# Patient Record
Sex: Male | Born: 1950 | Race: Black or African American | Hispanic: No | Marital: Married | Smoking: Former smoker
Health system: Southern US, Community
[De-identification: ages and names within clinical notes are randomized; demographics above are authoritative.]

## PROBLEM LIST (undated history)

## (undated) DIAGNOSIS — T7840XA Allergy, unspecified, initial encounter: Secondary | ICD-10-CM

## (undated) DIAGNOSIS — M1990 Unspecified osteoarthritis, unspecified site: Secondary | ICD-10-CM

## (undated) DIAGNOSIS — J449 Chronic obstructive pulmonary disease, unspecified: Secondary | ICD-10-CM

## (undated) DIAGNOSIS — IMO0001 Reserved for inherently not codable concepts without codable children: Secondary | ICD-10-CM

## (undated) DIAGNOSIS — J45909 Unspecified asthma, uncomplicated: Secondary | ICD-10-CM

## (undated) DIAGNOSIS — B370 Candidal stomatitis: Secondary | ICD-10-CM

## (undated) DIAGNOSIS — C61 Malignant neoplasm of prostate: Secondary | ICD-10-CM

## (undated) DIAGNOSIS — I10 Essential (primary) hypertension: Secondary | ICD-10-CM

## (undated) DIAGNOSIS — K219 Gastro-esophageal reflux disease without esophagitis: Secondary | ICD-10-CM

## (undated) HISTORY — DX: Candidal stomatitis: B37.0

## (undated) HISTORY — PX: HERNIA REPAIR: SHX51

## (undated) HISTORY — DX: Essential (primary) hypertension: I10

## (undated) HISTORY — DX: Unspecified asthma, uncomplicated: J45.909

## (undated) HISTORY — DX: Gastro-esophageal reflux disease without esophagitis: K21.9

## (undated) HISTORY — DX: Allergy, unspecified, initial encounter: T78.40XA

## (undated) HISTORY — DX: Unspecified osteoarthritis, unspecified site: M19.90

## (undated) HISTORY — PX: OTHER SURGICAL HISTORY: SHX169

## (undated) HISTORY — DX: Chronic obstructive pulmonary disease, unspecified: J44.9

## (undated) HISTORY — DX: Malignant neoplasm of prostate: C61

---

## 2002-07-31 ENCOUNTER — Encounter: Payer: Self-pay | Admitting: Emergency Medicine

## 2002-07-31 ENCOUNTER — Emergency Department (HOSPITAL_COMMUNITY): Admission: EM | Admit: 2002-07-31 | Discharge: 2002-07-31 | Payer: Self-pay | Admitting: Emergency Medicine

## 2003-06-22 ENCOUNTER — Encounter: Payer: Self-pay | Admitting: Internal Medicine

## 2003-06-22 ENCOUNTER — Encounter: Admission: RE | Admit: 2003-06-22 | Discharge: 2003-06-22 | Payer: Self-pay | Admitting: Internal Medicine

## 2003-06-29 ENCOUNTER — Encounter: Admission: RE | Admit: 2003-06-29 | Discharge: 2003-06-29 | Payer: Self-pay | Admitting: Internal Medicine

## 2003-06-29 ENCOUNTER — Encounter: Payer: Self-pay | Admitting: Internal Medicine

## 2003-08-05 ENCOUNTER — Encounter: Admission: RE | Admit: 2003-08-05 | Discharge: 2003-11-03 | Payer: Self-pay | Admitting: Neurosurgery

## 2005-10-30 ENCOUNTER — Encounter: Admission: RE | Admit: 2005-10-30 | Discharge: 2005-10-30 | Payer: Self-pay | Admitting: Internal Medicine

## 2006-12-06 ENCOUNTER — Ambulatory Visit (HOSPITAL_BASED_OUTPATIENT_CLINIC_OR_DEPARTMENT_OTHER): Admission: RE | Admit: 2006-12-06 | Discharge: 2006-12-06 | Payer: Self-pay | Admitting: Surgery

## 2010-08-12 ENCOUNTER — Emergency Department (HOSPITAL_COMMUNITY)
Admission: EM | Admit: 2010-08-12 | Discharge: 2010-08-13 | Payer: Self-pay | Source: Home / Self Care | Admitting: Emergency Medicine

## 2010-11-19 ENCOUNTER — Emergency Department (HOSPITAL_COMMUNITY)
Admission: EM | Admit: 2010-11-19 | Discharge: 2010-11-19 | Disposition: A | Discharge status: Home / Self Care | Payer: Worker's Compensation | Attending: Emergency Medicine | Admitting: Emergency Medicine

## 2010-11-19 DIAGNOSIS — I10 Essential (primary) hypertension: Secondary | ICD-10-CM | POA: Insufficient documentation

## 2010-11-19 DIAGNOSIS — S61209A Unspecified open wound of unspecified finger without damage to nail, initial encounter: Secondary | ICD-10-CM | POA: Insufficient documentation

## 2010-11-19 DIAGNOSIS — W268XXA Contact with other sharp object(s), not elsewhere classified, initial encounter: Secondary | ICD-10-CM | POA: Insufficient documentation

## 2010-11-19 DIAGNOSIS — Y9269 Other specified industrial and construction area as the place of occurrence of the external cause: Secondary | ICD-10-CM | POA: Insufficient documentation

## 2010-11-19 DIAGNOSIS — Y990 Civilian activity done for income or pay: Secondary | ICD-10-CM | POA: Insufficient documentation

## 2011-02-03 NOTE — Op Note (Signed)
NAME:  [name], [name]              ACCOUNT NO.:  [id number]   MEDICAL RECORD NO.:  [id number]          PATIENT TYPE:  AMB   LOCATION:  DSC                          FACILITY:  [facility]   PHYSICIAN:  Wilmon Arms. Corliss Skains, M.D. DATE OF BIRTH:  09/05/1951   DATE OF PROCEDURE:  12/06/2006  DATE OF DISCHARGE:                               OPERATIVE REPORT   PREOPERATIVE DIAGNOSES:  1. Left inguinal hernia.  2. Umbilical hernia.   POSTOPERATIVE DIAGNOSES:  1. Left inguinal hernia.  2. Umbilical hernia.   PROCEDURE PERFORMED:  1. Left inguinal hernia repair with mesh.  2. Umbilical hernia repair with mesh.   SURGEON:  Wilmon Arms. Tsuei, M.D.   ANESTHESIA:  General endotracheal.   INDICATIONS:  The patient is a 60 year old male who recently presented  with left groin pain.  He has developed a small bulge in this area.  His  job requires lot of heavy lifting, and this probably has contributed to  his hernia.  On careful examination, he was also noted to have a small  umbilical hernia.  He has elected to have both of these repaired at the  same time.   DESCRIPTION OF PROCEDURE:  The patient is brought to the operating room,  placed in supine position on the operating table.  After an adequate  level of general endotracheal anesthesia was obtained, the patient's  abdomen and left groin were shaved, prepped with Betadine and draped in  sterile fashion.  A time-out was taken to assure the proper patient and  proper procedure.  The area above his left inguinal ligament was  infiltrated with 0.25% Marcaine.  An oblique incision was made here.  Dissection was carried down to the external oblique fascia which was  opened along the direction of its fibers down to the external ring.  Blunt dissection was used to dissect around the spermatic cord.  The  ilioinguinal nerve was identified and preserved.  A fairly large  indirect hernia sac was dissected free from the spermatic cord and  reduced up  through the internal ring.  The internal ring was tightened  with a 2-0 Vicryl suture.  There was no direct hernia defect.  A keyhole  mesh was cut from a sheet of polypropylene mesh.  It was secured with 2-  0 Vicryl beginning at the pubic tubercle.  The tails were tucked around  the spermatic cord and underneath the external oblique fascia laterally.  The external oblique fascia was reapproximated with 2-0 Vicryl, and the  subcutaneous tissues were closed with 3-0 Vicryl.  Four-0 Monocryl was  used to close the skin.   We then turned our attention to the umbilical hernia.  An incision was  made below the umbilicus after infiltrating 0.25% Marcaine.  Dissection  was carried down to the subcutaneous tissues with cautery.  A small  hernia sac was peeled off of the posterior surface of the umbilicus.  This was reduced into the preperitoneal space.  A small Ventralex mesh  was then inserted in the preperitoneal space and deployed.  Zero Novofil  sutures were used to attach the  mesh to the posterior surface of the  fascia.  The umbilical defect was then closed with another 0 Novofil.  Three-  0 Vicryl was used to close the subcutaneous tissues, and 4-0 Monocryl  was used to close the skin.  Steri-Strips and clean dressings were  applied.  The patient was then extubated and brought to the recovery in  stable condition.  All sponge, instrument and needle counts were  correct.      Wilmon Arms. Tsuei, M.D.  Electronically Signed     MKT/MEDQ  D:  12/06/2006  T:  12/06/2006  Job:  [id number]   cc:   Olene Craven, M.D.

## 2011-09-25 ENCOUNTER — Encounter: Payer: Self-pay | Admitting: Internal Medicine

## 2011-10-27 ENCOUNTER — Other Ambulatory Visit: Payer: Self-pay | Admitting: Internal Medicine

## 2011-11-05 ENCOUNTER — Ambulatory Visit: Payer: Managed Care, Other (non HMO)

## 2011-11-05 ENCOUNTER — Ambulatory Visit (INDEPENDENT_AMBULATORY_CARE_PROVIDER_SITE_OTHER): Payer: Managed Care, Other (non HMO) | Admitting: Internal Medicine

## 2011-11-05 ENCOUNTER — Other Ambulatory Visit: Payer: Self-pay | Admitting: Internal Medicine

## 2011-11-05 DIAGNOSIS — I10 Essential (primary) hypertension: Secondary | ICD-10-CM

## 2011-11-05 DIAGNOSIS — Z0000 Encounter for general adult medical examination without abnormal findings: Secondary | ICD-10-CM | POA: Insufficient documentation

## 2011-11-05 DIAGNOSIS — Z79899 Other long term (current) drug therapy: Secondary | ICD-10-CM

## 2011-11-05 DIAGNOSIS — K219 Gastro-esophageal reflux disease without esophagitis: Secondary | ICD-10-CM

## 2011-11-05 DIAGNOSIS — G8929 Other chronic pain: Secondary | ICD-10-CM | POA: Insufficient documentation

## 2011-11-05 LAB — LIPID PANEL
Cholesterol: 165 mg/dL (ref 0–200)
Triglycerides: 70 mg/dL (ref <150)
VLDL: 14 mg/dL (ref 0–40)

## 2011-11-05 LAB — COMPREHENSIVE METABOLIC PANEL
Albumin: 4.4 g/dL (ref 3.5–5.2)
BUN: 19 mg/dL (ref 6–23)
CO2: 25 mEq/L (ref 19–32)
Glucose, Bld: 84 mg/dL (ref 70–99)
Sodium: 138 mEq/L (ref 135–145)
Total Bilirubin: 0.7 mg/dL (ref 0.3–1.2)
Total Protein: 6.7 g/dL (ref 6.0–8.3)

## 2011-11-05 LAB — POCT CBC
HCT, POC: 47.1 % (ref 43.5–53.7)
Hemoglobin: 15.1 g/dL (ref 14.1–18.1)
MCH, POC: 29 pg (ref 27–31.2)
MPV: 11.1 fL (ref 0–99.8)
POC MID %: 12.3 %M — AB (ref 0–12)
RBC: 5.2 M/uL (ref 4.69–6.13)
WBC: 5.7 10*3/uL (ref 4.6–10.2)

## 2011-11-05 LAB — POCT URINALYSIS DIPSTICK
Bilirubin, UA: NEGATIVE
Glucose, UA: NEGATIVE
Spec Grav, UA: 1.02

## 2011-11-05 LAB — POCT UA - MICROSCOPIC ONLY
Bacteria, U Microscopic: NEGATIVE
Casts, Ur, LPF, POC: NEGATIVE
Mucus, UA: NEGATIVE

## 2011-11-05 MED ORDER — CELECOXIB 200 MG PO CAPS: 200.0000 mg | ORAL_CAPSULE | Freq: Two times a day (BID) | ORAL | Status: DC

## 2011-11-05 MED ORDER — LISINOPRIL 5 MG PO TABS: 5.0000 mg | ORAL_TABLET | Freq: Every day | ORAL | Status: DC

## 2011-11-05 MED ORDER — DEXLANSOPRAZOLE 60 MG PO CPDR: 60.0000 mg | DELAYED_RELEASE_CAPSULE | Freq: Every day | ORAL | Status: DC

## 2011-11-05 NOTE — Progress Notes (Signed)
  Subjective:    Patient ID: [name], male    DOB: 1951/05/24, 61 y.o.   MRN: [id number]  HPI CPE GERD HTN ED Joint pain Allergy/asthma Edrick Oh great  See scanned form  Review of Systems  See scanned form   Objective:   Physical Exam  Constitutional: He is oriented to person, place, and time. He appears well-developed and well-nourished.  HENT:  Nose: Nose normal.  Mouth/Throat: Oropharynx is clear and moist.  Eyes: EOM are normal. Pupils are equal, round, and reactive to light.  Neck: Normal range of motion.  Cardiovascular: Normal rate, regular rhythm and normal heart sounds.   Pulmonary/Chest: Effort normal and breath sounds normal.  Abdominal: Soft. Bowel sounds are normal. He exhibits no mass. There is no tenderness.  Genitourinary: Rectum normal, prostate normal and penis normal.  Musculoskeletal: Normal range of motion.  Neurological: He is alert and oriented to person, place, and time. He has normal reflexes. No cranial nerve deficit. Coordination normal.  Skin: Skin is warm and dry.  Psychiatric: He has a normal mood and affect.     [facility] reading (PRIMARY) by  Dr. Perrin Maltese  CXR NAD       Assessment & Plan:  Refill meds one year  Cialis 20mg  rx hand written  F/up 6 mos.

## 2011-11-06 ENCOUNTER — Encounter: Payer: Self-pay | Admitting: Family Medicine

## 2011-11-23 ENCOUNTER — Other Ambulatory Visit: Payer: Self-pay | Admitting: Internal Medicine

## 2011-11-27 ENCOUNTER — Telehealth: Payer: Self-pay

## 2011-11-27 MED ORDER — LISINOPRIL 5 MG PO TABS: 5.0000 mg | ORAL_TABLET | Freq: Every day | ORAL | Status: DC

## 2011-11-27 NOTE — Telephone Encounter (Signed)
.  [facility]    PT STATES HE WAS ONLY GIVEN 15 OF HIS LISINOPRIL,HE WAS JUST HERE 3 WKS AGO, AND DR GUEST CHECKED HIS BP AND DISCUSSED HIS RX'S SO SHE STATES HE SHOULD NOT HAVE TO COME BACK IN FOR SAME.    BEST PHONE [phone]

## 2011-11-27 NOTE — Telephone Encounter (Signed)
Pt was seen in Feb.  rx okayed for 1 year.  rx sent in.  Pt notified

## 2012-02-08 ENCOUNTER — Other Ambulatory Visit: Payer: Self-pay | Admitting: Physician Assistant

## 2012-03-05 ENCOUNTER — Other Ambulatory Visit: Payer: Self-pay | Admitting: Internal Medicine

## 2012-06-06 ENCOUNTER — Other Ambulatory Visit: Payer: Self-pay | Admitting: Physician Assistant

## 2012-07-23 ENCOUNTER — Other Ambulatory Visit: Payer: Self-pay | Admitting: Physician Assistant

## 2012-07-28 ENCOUNTER — Telehealth: Payer: Self-pay

## 2012-07-28 ENCOUNTER — Ambulatory Visit (INDEPENDENT_AMBULATORY_CARE_PROVIDER_SITE_OTHER): Payer: Managed Care, Other (non HMO) | Admitting: Internal Medicine

## 2012-07-28 VITALS — BP 124/78 | HR 82 | Temp 97.8°F | Resp 16 | Ht 69.58 in | Wt 202.2 lb

## 2012-07-28 DIAGNOSIS — N529 Male erectile dysfunction, unspecified: Secondary | ICD-10-CM

## 2012-07-28 DIAGNOSIS — Z79899 Other long term (current) drug therapy: Secondary | ICD-10-CM

## 2012-07-28 DIAGNOSIS — R21 Rash and other nonspecific skin eruption: Secondary | ICD-10-CM

## 2012-07-28 DIAGNOSIS — I10 Essential (primary) hypertension: Secondary | ICD-10-CM

## 2012-07-28 DIAGNOSIS — M2550 Pain in unspecified joint: Secondary | ICD-10-CM

## 2012-07-28 DIAGNOSIS — G8929 Other chronic pain: Secondary | ICD-10-CM

## 2012-07-28 LAB — BASIC METABOLIC PANEL
BUN: 15 mg/dL (ref 6–23)
Calcium: 9.4 mg/dL (ref 8.4–10.5)
Creat: 0.71 mg/dL (ref 0.50–1.35)
Glucose, Bld: 148 mg/dL — ABNORMAL HIGH (ref 70–99)

## 2012-07-28 MED ORDER — CLOBETASOL PROPIONATE 0.05 % EX OINT: TOPICAL_OINTMENT | Freq: Two times a day (BID) | CUTANEOUS | Status: DC

## 2012-07-28 MED ORDER — TADALAFIL 20 MG PO TABS: 20.0000 mg | ORAL_TABLET | Freq: Every day | ORAL | Status: DC | PRN

## 2012-07-28 MED ORDER — CELECOXIB 200 MG PO CAPS: 200.0000 mg | ORAL_CAPSULE | Freq: Two times a day (BID) | ORAL | Status: DC

## 2012-07-28 NOTE — Progress Notes (Signed)
  Subjective:    Patient ID: [name], male    DOB: [birth year], 61 y.o.   MRN: [id number]  HPI No problems , is doing well Htn, gerd , ed all controlled.   Review of Systems     Objective:   Physical Exam Lungs clear Heart nl Skin itchy lower leg rsah       Assessment & Plan:  Dry skin/ clobetasol qhs RF meds 1 yr

## 2012-07-28 NOTE — Patient Instructions (Signed)

## 2012-08-08 ENCOUNTER — Telehealth: Payer: Self-pay

## 2012-08-08 NOTE — Telephone Encounter (Signed)
I called him back, he was sent to appt sch for this, he wants to see Dr Perrin Maltese.

## 2012-08-08 NOTE — Telephone Encounter (Signed)
PT STATES HE HAD RECEIVED A LETTER ABOUT HIM FOLLOWING UP FOR LAB WORK AND WANTED HIM TO DO IT ON THE 12TH, BUT HE JUST RECEIVED THIS IN THE MAIL YESTERDAY AND DOESN'T KNOW WHY IT TOOK SO LONG AND WHEN SHOULD HE COME BACK IN. PLEASE CALL [phone] OR [phone]

## 2012-08-12 ENCOUNTER — Telehealth: Payer: Self-pay | Admitting: Radiology

## 2012-08-12 NOTE — Telephone Encounter (Signed)
Patient called again about the labs, I discussed with him, as I have previously. He is advised again to return to clinic for the repeat glucose, fasting. He wants appt, so he is transferred to appt scheduling. Amy

## 2012-08-22 ENCOUNTER — Telehealth: Payer: Self-pay

## 2012-08-22 NOTE — Telephone Encounter (Signed)
The patient called to return call to this number in his telephone.  This Clinical research associate asked patient if there was a voicemail left for him and if so, did he remember the name of the person who called him.  The patient stated there was a message, but he didn't listen to it, he just called the number back.  No documentation of call to patient today- last telephone message dated 08/12/12.  Please call the patient at [phone].

## 2012-08-23 NOTE — Telephone Encounter (Signed)
LMOM for pt to let him know that he received a call from [place] yesterday to remind him of his appt on Mon 12/9 at 9:45.

## 2012-08-26 ENCOUNTER — Ambulatory Visit (INDEPENDENT_AMBULATORY_CARE_PROVIDER_SITE_OTHER): Payer: Managed Care, Other (non HMO) | Admitting: Internal Medicine

## 2012-08-26 ENCOUNTER — Encounter: Payer: Self-pay | Admitting: Internal Medicine

## 2012-08-26 VITALS — BP 125/81 | HR 84 | Temp 97.5°F | Resp 17 | Ht 69.0 in | Wt 207.0 lb

## 2012-08-26 DIAGNOSIS — R739 Hyperglycemia, unspecified: Secondary | ICD-10-CM

## 2012-08-26 DIAGNOSIS — N529 Male erectile dysfunction, unspecified: Secondary | ICD-10-CM

## 2012-08-26 DIAGNOSIS — R7309 Other abnormal glucose: Secondary | ICD-10-CM

## 2012-08-26 LAB — POCT GLYCOSYLATED HEMOGLOBIN (HGB A1C): Hemoglobin A1C: 5.5

## 2012-08-26 LAB — GLUCOSE, POCT (MANUAL RESULT ENTRY): POC Glucose: 124 mg/dl — AB (ref 70–99)

## 2012-08-26 NOTE — Progress Notes (Signed)
  Subjective:    Patient ID: [name], male    DOB: 18-Nov-1950, 61 y.o.   MRN: [id number]  HPI Had elevated glucose last visit and needs diabetes screening ED not much better on cialis, will screen testosterone   Review of Systems unchanged    Objective:   Physical Exam  Vitals reviewed. Constitutional: He is oriented to person, place, and time. He appears well-developed and well-nourished.  Eyes: EOM are normal.  Pulmonary/Chest: Effort normal.  Neurological: He is alert and oriented to person, place, and time. Coordination normal.  Psychiatric: He has a normal mood and affect. His behavior is normal. Judgment and thought content normal.   Results for orders placed in visit on 08/26/12  GLUCOSE, POCT (MANUAL RESULT ENTRY)      Component Value Range   POC Glucose 124 (*) 70 - 99 mg/dl  POCT GLYCOSYLATED HEMOGLOBIN (HGB A1C)      Component Value Range   Hemoglobin A1C 5.5       Total T a1c and FBS     Assessment & Plan:  No Diabetes ED

## 2012-08-27 ENCOUNTER — Encounter: Payer: Self-pay | Admitting: *Deleted

## 2012-09-16 ENCOUNTER — Telehealth: Payer: Self-pay

## 2012-09-16 DIAGNOSIS — N529 Male erectile dysfunction, unspecified: Secondary | ICD-10-CM

## 2012-09-16 NOTE — Telephone Encounter (Signed)
Dr. Perrin Maltese did not indicate what the next step would be.  It sounds like Cialis is not working well for patient, has he tried Viagra or Levitra?  Those could be possibilities.  Additionally there are other modalities such as injections, pumps, etc.  We can also make a referral to urology if he would like

## 2012-09-16 NOTE — Telephone Encounter (Signed)
Any suggestions on what to tell pt?

## 2012-09-16 NOTE — Telephone Encounter (Signed)
Patient would like to know what to do next. He got his testosterone labs and they were normal. Wasn't sure how to follow up. #[phone]

## 2012-09-17 MED ORDER — SILDENAFIL CITRATE 50 MG PO TABS: 50.0000 mg | ORAL_TABLET | Freq: Every day | ORAL | Status: DC | PRN

## 2012-09-17 NOTE — Telephone Encounter (Signed)
Viagra sent to [facility]

## 2012-09-17 NOTE — Telephone Encounter (Signed)
Spoke to patient, to see if he wants referral to Urology he does want this referral.( this is put in for him) Also would like to try the Viagra, he wants it sent to [facility].

## 2012-09-17 NOTE — Telephone Encounter (Signed)
Left message for him to call me back.  

## 2012-09-20 NOTE — Telephone Encounter (Signed)
LMOM for pt that Rx was sent to pharmacy.

## 2012-09-23 ENCOUNTER — Telehealth: Payer: Self-pay

## 2012-09-23 MED ORDER — TADALAFIL 20 MG PO TABS: 20.0000 mg | ORAL_TABLET | Freq: Every day | ORAL | Status: DC | PRN

## 2012-09-23 NOTE — Telephone Encounter (Signed)
Pt notified that rx was sent in 

## 2012-09-23 NOTE — Telephone Encounter (Signed)
PT WOULD LIKE TO GO BACK ON THE 20 MG. CIALIS.  SAYS THE VIAGRA IS NOT WORKING.  [phone]

## 2012-09-23 NOTE — Telephone Encounter (Signed)
PT WOULD LIKE TO GO BACK ON THE 20 MG CIALIS.  HE SAYS THE VIAGRA IS NOT WORKING. [phone]

## 2012-09-23 NOTE — Telephone Encounter (Signed)
I have resent Cialis.  Discontinue Viagra.  DO NOT take these medications together.

## 2012-10-09 ENCOUNTER — Other Ambulatory Visit: Payer: Self-pay | Admitting: Physician Assistant

## 2012-11-30 ENCOUNTER — Other Ambulatory Visit: Payer: Self-pay | Admitting: Physician Assistant

## 2012-12-30 ENCOUNTER — Other Ambulatory Visit: Payer: Self-pay | Admitting: Physician Assistant

## 2013-01-07 ENCOUNTER — Telehealth: Payer: Self-pay

## 2013-01-07 ENCOUNTER — Ambulatory Visit (INDEPENDENT_AMBULATORY_CARE_PROVIDER_SITE_OTHER): Payer: Managed Care, Other (non HMO) | Admitting: Family Medicine

## 2013-01-07 ENCOUNTER — Ambulatory Visit: Payer: Managed Care, Other (non HMO)

## 2013-01-07 VITALS — BP 114/78 | HR 75 | Temp 98.0°F | Resp 16 | Ht 69.0 in | Wt 206.6 lb

## 2013-01-07 DIAGNOSIS — M25562 Pain in left knee: Secondary | ICD-10-CM

## 2013-01-07 DIAGNOSIS — M25569 Pain in unspecified knee: Secondary | ICD-10-CM

## 2013-01-07 LAB — POCT CBC
HCT, POC: 49.5 % (ref 43.5–53.7)
Lymph, poc: 2.2 (ref 0.6–3.4)
MCHC: 32.1 g/dL (ref 31.8–35.4)
MID (cbc): 0.8 (ref 0–0.9)
POC Granulocyte: 3.4 (ref 2–6.9)
POC LYMPH PERCENT: 33.6 %L (ref 10–50)
POC MID %: 12.9 %M — AB (ref 0–12)
Platelet Count, POC: 317 10*3/uL (ref 142–424)
RDW, POC: 13.4 %

## 2013-01-07 MED ORDER — HYDROCODONE-ACETAMINOPHEN 5-325 MG PO TABS: 1.0000 | ORAL_TABLET | Freq: Four times a day (QID) | ORAL | Status: DC | PRN

## 2013-01-07 NOTE — Progress Notes (Signed)
[address] [place] [address]   Phone [phone]  Subjective:    Patient ID: [name], male    DOB: [birth year], 62 y.o.   MRN: [id number]  HPI Pt presents to clinic with 2 wk h/o knee pain that is getting worse.  He had an injury in to 40s and had fluid drawn off the knee and over the last couple of years has had 2 flairs that would last about a week and then they would spontaneously resolve.  This episode did not start with any particular event but it has been going on longer than normal and hurting more than normal.  He has taken additional Aleve and it is not helping the pain.  He is using OTC pain patches and they are not helping.  He has not had medical evaluation of his knee since the 70s.  He cannot walk because of the pain and the fact that he cannot bend it because of the swelling.  He stands and walks all the time at work and he cannot currently do that because of the pain.   Review of Systems  Constitutional: Negative for fever and chills.  Cardiovascular: Negative for leg swelling.  Musculoskeletal: Positive for back pain (some L hip pain that has started since his gait has changed due to the knee pain), joint swelling and gait problem (2nd to pain).       Objective:   Physical Exam  Vitals reviewed. Constitutional: He is oriented to person, place, and time. He appears well-developed and well-nourished.  HENT:  Head: Normocephalic and atraumatic.  Right Ear: External ear normal.  Left Ear: External ear normal.  Pulmonary/Chest: Effort normal.  Musculoskeletal:       Left knee: He exhibits decreased range of motion (slightly decreased extension and flexion - pt states the knee feel tight) and effusion (in the upper aspect of the knee joint). He exhibits no ecchymosis and no erythema. Tenderness found. Medial joint line tenderness noted. No lateral joint line, no MCL, no LCL and no patellar tendon tenderness noted.  Neg lachmans, neg anterior drawer, pain with  McMurrays with internal hip rotation and extension of knee.  The knee is slightly warm compared with the L knee.  There is no erythema.  Neurological: He is alert and oriented to person, place, and time.  Skin: Skin is warm and dry.  Psychiatric: He has a normal mood and affect. His behavior is normal. Judgment and thought content normal.    [facility] reading (PRIMARY) by  Dr. Conley Rolls.  Significant degenerative changes.      Assessment & Plan:  Knee pain, acute, left - Plan: DG Knee Complete 4 Views Left, Uric Acid, POCT CBC, Body fluid culture, Cell count + diff,  w/ cryst-synvl fld, HYDROcodone-acetaminophen (NORCO/VICODIN) 5-325 MG per tablet -- due to location of the effusion I am unsure that I will be successful with a joint aspiration.  Pt opted to wait until a sports fellow would be here and I had Dr Denyse Amass see him for the aspiration and injection.  He will use ice.  D/w pt that he may continue to have problems with his knee with his amount of squatting at work and going up and down stairs.  He may have a degenerative meniscal tear and if he would like in the future we can do a referral to ortho but with only 1-2 flairs per year I do not think that it is necessary at this time and patient agreed.  Benny Lennert PA-C 01/07/2013 7:09 PM  Patient examined and I agree with plan. Tle

## 2013-01-07 NOTE — Telephone Encounter (Signed)
A note for tomorrow might be good - that way he can rest.  He asked for one here and I am sorry that I forgot to give it to him.

## 2013-01-07 NOTE — Progress Notes (Signed)
Left Knee aspiration & injection. Consent obtained and timeout performed. Patient position with legs extended supine on exam table.  Superior lateral patellar space palpated and marked. Skin was cleaned with alcohol and cold spray applied. A 25-gauge 1-1/2 and she was used to inject 3 mL of lidocaine. The skin was then cleaned with Betadine. An 18-gauge 1-1/2 inch needle was used to access the joint capsule.  20 mL of blood-tinged joint fluid were aspirated. The syringe was then exchanged and 4 mL of lidocaine and 40 mg of Kenalog were injected into the knee joint. Patient tolerated procedure well no bleeding. Pain improved following injection  Clementeen Graham, S MD

## 2013-01-07 NOTE — Telephone Encounter (Signed)
Patient was seen here today and states that he had his knee drained. Patient would like to know if it will be okay for him to go back to work tomorrow or if he will need to stay off his feet. Please call patient back at [phone]. Thanks

## 2013-01-08 ENCOUNTER — Telehealth: Payer: Self-pay

## 2013-01-08 LAB — SYNOVIAL CELL COUNT + DIFF, W/ CRYSTALS
Lymphocytes-Synovial Fld: 36 % — ABNORMAL HIGH (ref 0–20)
Monocyte/Macrophage: 53 % (ref 50–90)

## 2013-01-08 NOTE — Telephone Encounter (Signed)
Note for return to work left at front desk.

## 2013-01-08 NOTE — Telephone Encounter (Signed)
Pt was called and was left a message asking if he wanted his return to work note faxed or left at front desk and he said that he would like for it to be left at front desk.

## 2013-01-11 LAB — BODY FLUID CULTURE: Gram Stain: NONE SEEN

## 2013-01-24 ENCOUNTER — Telehealth: Payer: Self-pay

## 2013-01-24 DIAGNOSIS — M25562 Pain in left knee: Secondary | ICD-10-CM

## 2013-01-24 NOTE — Telephone Encounter (Signed)
Does he want to see ortho ? Called him no answer.

## 2013-01-24 NOTE — Telephone Encounter (Signed)
Pt states that his knee is still swollen and warm to the touch, pt had it drained not to long ago and would like to know what he should do now. Best# [phone]

## 2013-01-25 ENCOUNTER — Telehealth: Payer: Self-pay

## 2013-01-25 NOTE — Telephone Encounter (Signed)
KNEE IS STILL BOTHERING HIM. SAYS HE GOT A CALL FROM [place] YESTERDAY BUT HE MISSED THE CALL. [phone]

## 2013-01-27 NOTE — Telephone Encounter (Signed)
Called him, Maralyn Sago had advised Ortho when his labs resulted. Called again to offer this. Left message.

## 2013-01-27 NOTE — Telephone Encounter (Signed)
He is advised to come back in for the fluid to be taken off again, if  It gets worse.

## 2013-02-18 ENCOUNTER — Other Ambulatory Visit: Payer: Self-pay | Admitting: Internal Medicine

## 2013-02-24 ENCOUNTER — Encounter: Payer: Self-pay | Admitting: Internal Medicine

## 2013-02-24 ENCOUNTER — Ambulatory Visit (INDEPENDENT_AMBULATORY_CARE_PROVIDER_SITE_OTHER): Payer: Managed Care, Other (non HMO) | Admitting: Internal Medicine

## 2013-02-24 VITALS — BP 130/88 | HR 92 | Temp 98.3°F | Resp 16 | Ht 69.5 in | Wt 205.8 lb

## 2013-02-24 DIAGNOSIS — N529 Male erectile dysfunction, unspecified: Secondary | ICD-10-CM

## 2013-02-24 DIAGNOSIS — M1710 Unilateral primary osteoarthritis, unspecified knee: Secondary | ICD-10-CM

## 2013-02-24 DIAGNOSIS — M25569 Pain in unspecified knee: Secondary | ICD-10-CM

## 2013-02-24 DIAGNOSIS — I10 Essential (primary) hypertension: Secondary | ICD-10-CM

## 2013-02-24 DIAGNOSIS — T7840XD Allergy, unspecified, subsequent encounter: Secondary | ICD-10-CM

## 2013-02-24 DIAGNOSIS — K219 Gastro-esophageal reflux disease without esophagitis: Secondary | ICD-10-CM

## 2013-02-24 DIAGNOSIS — R21 Rash and other nonspecific skin eruption: Secondary | ICD-10-CM

## 2013-02-24 DIAGNOSIS — J301 Allergic rhinitis due to pollen: Secondary | ICD-10-CM

## 2013-02-24 DIAGNOSIS — M1712 Unilateral primary osteoarthritis, left knee: Secondary | ICD-10-CM

## 2013-02-24 MED ORDER — CLOBETASOL PROPIONATE 0.05 % EX OINT: TOPICAL_OINTMENT | Freq: Two times a day (BID) | CUTANEOUS | Status: DC

## 2013-02-24 MED ORDER — DEXLANSOPRAZOLE 60 MG PO CPDR: 60.0000 mg | DELAYED_RELEASE_CAPSULE | Freq: Every day | ORAL | Status: DC

## 2013-02-24 MED ORDER — CELECOXIB 200 MG PO CAPS: 200.0000 mg | ORAL_CAPSULE | Freq: Two times a day (BID) | ORAL | Status: DC

## 2013-02-24 MED ORDER — LISINOPRIL 5 MG PO TABS: 5.0000 mg | ORAL_TABLET | Freq: Every day | ORAL | Status: DC

## 2013-02-24 MED ORDER — TADALAFIL 20 MG PO TABS: 20.0000 mg | ORAL_TABLET | Freq: Every day | ORAL | Status: DC | PRN

## 2013-02-24 MED ORDER — FLUTICASONE PROPIONATE 50 MCG/ACT NA SUSP: 2.0000 | Freq: Every day | NASAL | Status: DC

## 2013-02-24 NOTE — Progress Notes (Signed)
  Subjective:    Patient ID: [name], male    DOB: 11/16/1950, 62 y.o.   MRN: [id number]  HPI Knee pain progressively worse, swelling all the time and cannot walk without assistance, Is scheduled for surgery. ED, allergys,gerd,all need rfs.  Review of Systems     Objective:   Physical Exam  Vitals reviewed. Constitutional: He is oriented to person, place, and time. He appears well-developed and well-nourished. He appears distressed.  Eyes: EOM are normal.  Cardiovascular: Normal rate, regular rhythm and normal heart sounds.   Pulmonary/Chest: Effort normal and breath sounds normal.  Musculoskeletal: He exhibits edema and tenderness.       Left knee: He exhibits decreased range of motion, swelling, effusion, bony tenderness and abnormal meniscus. He exhibits no erythema. Tenderness found.  Neurological: He is alert and oriented to person, place, and time. He exhibits normal muscle tone. Coordination abnormal.  Skin: No erythema.  Psychiatric: He has a normal mood and affect. His behavior is normal.   Walking with pain and limp       Assessment & Plan:  OK to rf meds 6 mos. See orthopedic today CPE soon

## 2013-02-24 NOTE — Patient Instructions (Signed)
Wear and Tear Disorders of the Knee (Arthritis, Osteoarthritis)  Everyone will experience wear and tear injuries (arthritis, osteoarthritis) of the knee. These are the changes we all get as we age. They come from the joint stress of daily living. The amount of cartilage damage in your knee and your symptoms determine if you need surgery. Mild problems require approximately two months recovery time. More severe problems take several months to recover. With mild problems, your surgeon may find worn and rough cartilage surfaces. With severe changes, your surgeon may find cartilage that has completely worn away and exposed the bone. Loose bodies of bone and cartilage, bone spurs (excess bone growth), and injuries to the menisci (cushions between the large bones of your leg) are also common. All of these problems can cause pain.  For a mild wear and tear problem, rough cartilage may simply need to be shaved and smoothed. For more severe problems with areas of exposed bone, your surgeon may use an instrument for roughing up the bone surfaces to stimulate new cartilage growth. Loose bodies are usually removed. Torn menisci may be trimmed or repaired.  ABOUT THE ARTHROSCOPIC PROCEDURE  Arthroscopy is a surgical technique. It allows your orthopedic surgeon to diagnose and treat your knee injury with accuracy. The surgeon looks into your knee through a small scope. The scope is like a small (pencil-sized) telescope. Arthroscopy is less invasive than open knee surgery. You can expect a more rapid recovery. After the procedure, you will be moved to a recovery area until most of the effects of the medication have worn off. Your caregiver will discuss the test results with you.  RECOVERY  The severity of the arthritis and the type of procedure performed will determine recovery time. Other important factors include age, physical condition, medical conditions, and the type of rehabilitation program. Strengthening your muscles after  arthroscopy helps guarantee a better recovery. Follow your caregiver's instructions. Use crutches, rest, elevate, ice, and do knee exercises as instructed. Your caregivers will help you and instruct you with exercises and other physical therapy required to regain your mobility, muscle strength, and functioning following surgery. Only take over-the-counter or prescription medicines for pain, discomfort, or fever as directed by your caregiver.   SEEK MEDICAL CARE IF:   · There is increased bleeding (more than a small spot) from the wound.  · You notice redness, swelling, or increasing pain in the wound.  · Pus is coming from wound.  · You develop an unexplained oral temperature above 102° F (38.9° C) , or as your caregiver suggests.  · You notice a foul smell coming from the wound or dressing.  · You have severe pain with motion of the knee.  SEEK IMMEDIATE MEDICAL CARE IF:   · You develop a rash.  · You have difficulty breathing.  · You have any allergic problems.  MAKE SURE YOU:   · Understand these instructions.  · Will watch your condition.  · Will get help right away if you are not doing well or get worse.  Document Released: 09/01/2000 Document Revised: 11/27/2011 Document Reviewed: 01/29/2008  [employer]® Patient Information ©2014 [employer].

## 2013-02-25 LAB — COMPREHENSIVE METABOLIC PANEL
AST: 15 U/L (ref 0–37)
Albumin: 4.5 g/dL (ref 3.5–5.2)
BUN: 11 mg/dL (ref 6–23)
CO2: 28 mEq/L (ref 19–32)
Calcium: 9.5 mg/dL (ref 8.4–10.5)
Chloride: 101 mEq/L (ref 96–112)
Creat: 0.8 mg/dL (ref 0.50–1.35)
Potassium: 4.4 mEq/L (ref 3.5–5.3)

## 2013-03-18 HISTORY — PX: KNEE SURGERY: SHX244

## 2013-05-20 ENCOUNTER — Other Ambulatory Visit: Payer: Self-pay | Admitting: Physician Assistant

## 2013-05-23 ENCOUNTER — Other Ambulatory Visit: Payer: Self-pay | Admitting: Physician Assistant

## 2013-06-23 ENCOUNTER — Ambulatory Visit: Payer: Managed Care, Other (non HMO)

## 2013-06-23 ENCOUNTER — Ambulatory Visit (INDEPENDENT_AMBULATORY_CARE_PROVIDER_SITE_OTHER): Payer: Managed Care, Other (non HMO) | Admitting: Internal Medicine

## 2013-06-23 ENCOUNTER — Encounter: Payer: Self-pay | Admitting: Internal Medicine

## 2013-06-23 VITALS — BP 142/88 | HR 81 | Temp 98.5°F | Resp 18 | Ht 69.25 in | Wt 212.2 lb

## 2013-06-23 DIAGNOSIS — N529 Male erectile dysfunction, unspecified: Secondary | ICD-10-CM

## 2013-06-23 DIAGNOSIS — Z0000 Encounter for general adult medical examination without abnormal findings: Secondary | ICD-10-CM

## 2013-06-23 DIAGNOSIS — I10 Essential (primary) hypertension: Secondary | ICD-10-CM

## 2013-06-23 DIAGNOSIS — R911 Solitary pulmonary nodule: Secondary | ICD-10-CM

## 2013-06-23 DIAGNOSIS — K219 Gastro-esophageal reflux disease without esophagitis: Secondary | ICD-10-CM

## 2013-06-23 DIAGNOSIS — Z79899 Other long term (current) drug therapy: Secondary | ICD-10-CM

## 2013-06-23 DIAGNOSIS — E041 Nontoxic single thyroid nodule: Secondary | ICD-10-CM

## 2013-06-23 DIAGNOSIS — J309 Allergic rhinitis, unspecified: Secondary | ICD-10-CM

## 2013-06-23 DIAGNOSIS — R8271 Bacteriuria: Secondary | ICD-10-CM

## 2013-06-23 LAB — COMPREHENSIVE METABOLIC PANEL
ALT: 24 U/L (ref 0–53)
AST: 14 U/L (ref 0–37)
Albumin: 4.5 g/dL (ref 3.5–5.2)
Alkaline Phosphatase: 83 U/L (ref 39–117)
Calcium: 9.5 mg/dL (ref 8.4–10.5)
Chloride: 106 mEq/L (ref 96–112)
Glucose, Bld: 107 mg/dL — ABNORMAL HIGH (ref 70–99)
Potassium: 4.3 mEq/L (ref 3.5–5.3)
Sodium: 139 mEq/L (ref 135–145)

## 2013-06-23 LAB — CBC WITH DIFFERENTIAL/PLATELET
Basophils Absolute: 0.1 10*3/uL (ref 0.0–0.1)
HCT: 46.7 % (ref 39.0–52.0)
Lymphocytes Relative: 37 % (ref 12–46)
Lymphs Abs: 1.6 10*3/uL (ref 0.7–4.0)
Neutro Abs: 1.9 10*3/uL (ref 1.7–7.7)
Neutrophils Relative %: 43 % (ref 43–77)
Platelets: 280 10*3/uL (ref 150–400)
RDW: 13.9 % (ref 11.5–15.5)
WBC: 4.5 10*3/uL (ref 4.0–10.5)

## 2013-06-23 LAB — PSA: PSA: 3.12 ng/mL (ref <=4.00)

## 2013-06-23 LAB — LIPID PANEL
Cholesterol: 180 mg/dL (ref 0–200)
LDL Cholesterol: 129 mg/dL — ABNORMAL HIGH (ref 0–99)
Total CHOL/HDL Ratio: 5.1 Ratio
Triglycerides: 82 mg/dL (ref <150)
VLDL: 16 mg/dL (ref 0–40)

## 2013-06-23 LAB — POCT UA - MICROSCOPIC ONLY
Casts, Ur, LPF, POC: NEGATIVE
Crystals, Ur, HPF, POC: NEGATIVE
Mucus, UA: POSITIVE
Yeast, UA: NEGATIVE

## 2013-06-23 LAB — POCT URINALYSIS DIPSTICK
Ketones, UA: NEGATIVE
Leukocytes, UA: NEGATIVE
Nitrite, UA: NEGATIVE
Protein, UA: NEGATIVE
Urobilinogen, UA: 0.2

## 2013-06-23 NOTE — Progress Notes (Signed)
  Subjective:    Patient ID: [name], male    DOB: 1951-01-03, 62 y.o.   MRN: [id number]  HPI No complaints HTN controlled, allergy controlled, recovering fro recent knee surgery. Tolerates all his meds. Reflux persists on his meds  Review of Systems  Constitutional: Negative.   HENT: Negative.   Eyes: Negative.   Respiratory: Negative.   Cardiovascular: Negative.   Gastrointestinal: Negative.   Endocrine: Negative.   Genitourinary: Negative.   Musculoskeletal: Positive for joint swelling and arthralgias.  Skin: Negative.   Allergic/Immunologic: Positive for environmental allergies.  Neurological: Negative.   Hematological: Negative.   Psychiatric/Behavioral: Negative.        Objective:   Physical Exam  Vitals reviewed. Constitutional: He is oriented to person, place, and time. He appears well-developed and well-nourished. No distress.  HENT:  Head: Normocephalic.  Right Ear: External ear normal.  Left Ear: External ear normal.  Nose: Nose normal.  Mouth/Throat: Oropharynx is clear and moist.  Eyes: Conjunctivae and EOM are normal. Pupils are equal, round, and reactive to light.  Neck: Normal range of motion. Neck supple. No tracheal deviation present. Thyromegaly present.  Cardiovascular: Normal rate, regular rhythm, normal heart sounds and intact distal pulses.   Pulmonary/Chest: Effort normal and breath sounds normal.  Abdominal: Soft. Bowel sounds are normal.  Genitourinary: Rectum normal, prostate normal and penis normal.  Lymphadenopathy:    He has no cervical adenopathy.  Neurological: He is alert and oriented to person, place, and time. He has normal reflexes. No cranial nerve deficit. He exhibits normal muscle tone. Coordination normal.  Skin: Rash noted.  Psychiatric: He has a normal mood and affect. His behavior is normal. Judgment and thought content normal.     [facility] reading (PRIMARY) by  Dr Perrin Maltese hx of pulmonary nodule 2013, please  compare. Results for orders placed in visit on 06/23/13  POCT UA - MICROSCOPIC ONLY      Result Value Range   WBC, Ur, HPF, POC 3-4     RBC, urine, microscopic 4-7     Bacteria, U Microscopic 2+     Mucus, UA pos     Epithelial cells, urine per micros 0-1     Crystals, Ur, HPF, POC neg     Casts, Ur, LPF, POC neg     Yeast, UA neg    POCT URINALYSIS DIPSTICK      Result Value Range   Color, UA yellow     Clarity, UA clear     Glucose, UA neg     Bilirubin, UA neg     Ketones, UA neg     Spec Grav, UA 1.025     Blood, UA trace     pH, UA 5.0     Protein, UA neg     Urobilinogen, UA 0.2     Nitrite, UA neg     Leukocytes, UA Negative           Assessment & Plan:  Possible thyroid nodules--Do thyroid US HTN controlled/GERD see GI since sxs persist RF Meds 1 yr

## 2013-06-23 NOTE — Patient Instructions (Addendum)
Immunization Schedule, Adult  Influenza vaccine.  Adults should be given 1 dose every year.  Tetanus, diphtheria, and pertussis (Td, Tdap) vaccine.  Adults who have not previously been given Tdap or who do not know their vaccine status should be given 1 dose of Tdap.  Adults should have a Td booster every 10 years.  Doses should be given if needed to catch up on missed doses in the past.  Pregnant women should be given 1 dose of Tdap vaccine during each pregnancy.  Varicella vaccine.  All adults without evidence of immunity to varicella should receive 2 doses or a second dose if they have received only 1 dose.  Pregnant women who do not have evidence of immunity should be given the first dose after their pregnancy.  Human papillomavirus (HPV) vaccine.  Women aged 36 through 26 years who have not been given the vaccine previously should be given the 3 dose series. The second dose should be given 1 to 2 months after the first dose. The third dose should be given at least 24 weeks after the first dose.  The vaccine is not recommended for use in pregnant women. However, pregnancy testing is not needed before being given a dose. If a woman is found to be pregnant after being given a dose, no treatment is needed. In that case, the remaining doses should be delayed until after the pregnancy.  Men aged 58 through 21 years who have not been given the vaccine previously should be given the 3 dose series. Men aged 46 through 58 years may be given the 3 dose series. The second dose should be given 1 to 2 months after the first dose. The third dose should be given at least 24 weeks after the first dose.  Zoster vaccine.  One dose is recommended for adults aged 41 years and older unless certain conditions are present.  Measles, mumps, and rubella (MMR) vaccine.  Adults born before 34 generally are considered immune to measles and mumps. Healthcare workers born before 1957 who do not have  evidence of immunity should consider vaccination.  Adults born in [birth year] or later should have 1 or more doses of MMR vaccine unless there is a contraindication for the vaccine or they have evidence of immunity to the diseases. A second dose should be given at least 28 days after the first dose. Adults receiving certain types of previous vaccines should consider or be given vaccine doses.  For women of childbearing age, rubella immunity should be determined. If there is no evidence of immunity, women who are not pregnant should be vaccinated. If there is no evidence of immunity, women who are pregnant should delay vaccination until after their pregnancy.  Pneumococcal polysaccharide (PPSV23) vaccine.  All adults aged 38 years and older should be given 1 dose.  Adults younger than age 80 years who have certain medical conditions, who smoke cigarettes, who reside in nursing homes or long-term care facilities, or who have an unknown vaccination history should usually be given 1 or 2 doses of the vaccine.  Pneumococcal 13-valent conjugate (PVC13) vaccine.  Adults aged 67 years or older with certain medical conditions and an unknown or incomplete pneumococcal vaccination history should usually be given 1 dose of the vaccine. This dose may be in addition to a PPSV23 vaccine dose.  Meningococcal vaccine.  First-year college students up to age 23 years who are living in residence halls should be given a dose if they did not receive a dose on  or after their 16th birthday.  A dose should be given to microbiologists working with certain meningitis bacteria, military recruits, and people who travel to or live in countries with a high rate of meningitis.  One or 2 doses should be given to adults who have certain high-risk conditions.  Hepatitis A vaccine.  Adults who wish to be protected from this disease, who have certain high-risk conditions, who work with hepatitis A-infected animals, who work in  hepatitis A research labs, or who travel to or work in countries with a high rate of hepatitis A should be given the 2 dose series of the vaccine.  Adults who were previously unvaccinated and who anticipate close contact with an international adoptee during the first 60 days after arrival in the [place] from a country with a high rate of hepatitis A should be given the vaccine. The first dose of the 2 dose series should be given 2 or more weeks before the arrival of the adoptee.  Hepatitis B vaccine.  Adults who wish to be protected from this disease, who have certain high-risk conditions, who may be exposed to blood or other infectious body fluids, who are household contacts or sex partners of hepatitis B positive people, who are clients or workers in certain care facilities, or who travel to or work in countries with a high rate of hepatitis B should be given the 3 dose series of the vaccine. If you travel outside the [place], additional vaccines may be needed. The Centers for Disease Control and Prevention (CDC) provides information about the vaccines, medicines, and other measures necessary to prevent illness and injury during international travel. Visit the CDC website at http://www.church.org/ or call (800) CDC-INFO [585-052-8600]. You may also consult a travel clinic or your caregiver. Document Released: 11/25/2003 Document Revised: 11/27/2011 Document Reviewed: 10/20/2011 [facility] Patient Information 2014 [place], [place]. Thyroid Diseases Your thyroid is a butterfly-shaped gland in your neck. It is located just above your collarbone. It is one of your endocrine glands, which make hormones. The thyroid helps set your metabolism. Metabolism is how your body gets energy from the foods you eat.  Millions of people have thyroid diseases. Women experience thyroid problems more often than men. In fact, overactive thyroid problems (hyperthyroidism) occur in 1% of all women. If you have a thyroid  disease, your body may use energy more slowly or quickly than it should.  Thyroid problems also include an immune disease where your body reacts against your thyroid gland (called thyroiditis). A different problem involves lumps and bumps (called nodules) that develop in the gland. The nodules are usually, but not always, noncancerous. THE MOST COMMON THYROID PROBLEMS AND CAUSES ARE DISCUSSED BELOW There are many causes for thyroid problems. Treatment depends upon the exact diagnosis and includes trying to reset your body's metabolism to a normal rate. Hyperthyroidism Too much thyroid hormone from an overactive thyroid gland is called hyperthyroidism. In hyperthyroidism, the body's metabolism speeds up. One of the most frequent forms of hyperthyroidism is known as Graves' disease. Graves' disease tends to run in families. Although Graves' is thought to be caused by a problem with the immune system, the exact nature of the genetic problem is unknown. Hypothyroidism Too little thyroid hormone from an underactive thyroid gland is called hypothyroidism. In hypothyroidism, the body's metabolism is slowed. Several things can cause this condition. Most causes affect the thyroid gland directly and hurt its ability to make enough hormone.  Rarely, there may be a pituitary gland tumor (  located near the base of the brain). The tumor can block the pituitary from producing thyroid-stimulating hormone (TSH). Your body makes TSH to stimulate the thyroid to work properly. If the pituitary does not make enough TSH, the thyroid fails to make enough hormones needed for good health. Whether the problem is caused by thyroid conditions or by the pituitary gland, the result is that the thyroid is not making enough hormones. Hypothyroidism causes many physical and mental processes to become sluggish. The body consumes less oxygen and produces less body heat. Thyroid Nodules A thyroid nodule is a small swelling or lump in the  thyroid gland. They are common. These nodules represent either a growth of thyroid tissue or a fluid-filled cyst. Both form a lump in the thyroid gland. Almost half of all people will have tiny thyroid nodules at some point in their lives. Typically, these are not noticeable until they become large and affect normal thyroid size. Larger nodules that are greater than a half inch across (about 1 centimeter) occur in about 5 percent of people. Although most nodules are not cancerous, people who have them should seek medical care to rule out cancer. Also, some thyroid nodules may produce too much thyroid hormone or become too large. Large nodules or a large gland can interfere with breathing or swallowing or may cause neck discomfort. Other problems Other thyroid problems include cancer and thyroiditis. Thyroiditis is a malfunction of the body's immune system. Normally, the immune system works to defend the body against infection and other problems. When the immune system is not working properly, it may mistakenly attack normal cells, tissues, and organs. Examples of autoimmune diseases are Hashimoto's thyroiditis (which causes low thyroid function) and Graves' disease (which causes excess thyroid function). SYMPTOMS  Symptoms vary greatly depending upon the exact type of problem with the thyroid. Hyperthyroidism-is when your thyroid is too active and makes more thyroid hormone than your body needs. The most common cause is Graves' Disease. Too much thyroid hormone can cause some or all of the following symptoms:  Anxiety.  Irritability.  Difficulty sleeping.  Fatigue.  A rapid or irregular heartbeat.  A fine tremor of your hands or fingers.  An increase in perspiration.  Sensitivity to heat.  Weight loss, despite normal food intake.  Brittle hair.  Enlargement of your thyroid gland (goiter).  Light menstrual periods.  Frequent bowel movements. Graves' disease can specifically cause eye  and skin problems. The skin problems involve reddening and swelling of the skin, often on your shins and on the top of your feet. Eye problems can include the following:  Excess tearing and sensation of grit or sand in either or both eyes.  Reddened or inflamed eyes.  Widening of the space between your eyelids.  Swelling of the lids and tissues around the eyes.  Light sensitivity.  Ulcers on the cornea.  Double vision.  Limited eye movements.  Blurred or reduced vision. Hypothyroidism- is when your thyroid gland is not active enough. This is more common than hyperthyroidism. Symptoms can vary a lot depending of the severity of the hormone deficiency. Symptoms may develop over a long period of time and can include several of the following:  Fatigue.  Sluggishness.  Increased sensitivity to cold.  Constipation.  Pale, dry skin.  A puffy face.  Hoarse voice.  High blood cholesterol level.  Unexplained weight gain.  Muscle aches, tenderness and stiffness.  Pain, stiffness or swelling in your joints.  Muscle weakness.  Heavier than normal  menstrual periods.  Brittle fingernails and hair.  Depression. Thyroid Nodules - most do not cause signs or symptoms. Occasionally, some may become so large that you can feel or even see the swelling at the base of your neck. You may realize a lump or swelling is there when you are shaving or putting on makeup. Men might become aware of a nodule when shirt collars suddenly feel too tight. Some nodules produce too much thyroid hormone. This can produce the same symptoms as hyperthyroidism (see above). Thyroid nodules are seldom cancerous. However, a nodule is more likely to be malignant (cancerous) if it:  Grows quickly or feels hard.  Causes you to become hoarse or to have trouble swallowing or breathing.  Causes enlarged lymph nodes under your jaw or in your neck. DIAGNOSIS  Because there are so many possible thyroid  conditions, your caregiver may ask for a number of tests. They will do this in order to narrow down the exact diagnosis. These tests can include:  Blood and antibody tests.  Special thyroid scans using small, safe amounts of radioactive iodine.  Ultrasound of the thyroid gland (particularly if there is a nodule or lump).  Biopsy. This is usually done with a special needle. A needle biopsy is a procedure to obtain a sample of cells from the thyroid. The tissue will be tested in a lab and examined under a microscope. TREATMENT  Treatment depends on the exact diagnosis. Hyperthyroidism  Beta-blockers help relieve many of the symptoms.  Anti-thyroid medications prevent the thyroid from making excess hormones.  Radioactive iodine treatment can destroy overactive thyroid cells. The iodine can permanently decrease the amount of hormone produced.  Surgery to remove the thyroid gland.  Treatments for eye problems that come from Graves' disease also include medications and special eye surgery, if felt to be appropriate. Hypothyroidism Thyroid replacement with levothyroxine is the mainstay of treatment. Treatment with thyroid replacement is usually lifelong and will require monitoring and adjustment from time to time. Thyroid Nodules  Watchful waiting. If a small nodule causes no symptoms or signs of cancer on biopsy, then no treatment may be chosen at first. Re-exam and re-checking blood tests would be the recommended follow-up.  Anti-thyroid medications or radioactive iodine treatment may be recommended if the nodules produce too much thyroid hormone (see Treatment for Hyperthyroidism above).  Alcohol ablation. Injections of small amounts of ethyl alcohol (ethanol) can cause a non-cancerous nodule to shrink in size.  Surgery (see Treatment for Hyperthyroidism above). HOME CARE INSTRUCTIONS   Take medications as instructed.  Follow through on recommended testing. SEEK MEDICAL CARE IF:    You feel that you are developing symptoms of Hyperthyroidism or Hypothyroidism as described above.  You develop a new lump/nodule in the neck/thyroid area that you had not noticed before.  You feel that you are having side effects from medicines prescribed.  You develop trouble breathing or swallowing. SEEK IMMEDIATE MEDICAL CARE IF:   You develop a fever of 102 F (38.9 C) or higher.  You develop severe sweating.  You develop palpitations and/or rapid heart beat.  You develop shortness of breath.  You develop nausea and vomiting.  You develop extreme shakiness.  You develop agitation.  You develop lightheadedness or have a fainting episode. Document Released: 07/02/2007 Document Revised: 11/27/2011 Document Reviewed: 07/02/2007 [facility] Patient Information 2014 [place], [place].

## 2013-06-24 LAB — URINE CULTURE: Organism ID, Bacteria: NO GROWTH

## 2013-06-25 LAB — IFOBT (OCCULT BLOOD): IFOBT: NEGATIVE

## 2013-06-27 ENCOUNTER — Other Ambulatory Visit: Payer: Self-pay | Admitting: *Deleted

## 2013-06-27 ENCOUNTER — Other Ambulatory Visit: Payer: Worker's Compensation

## 2013-06-27 DIAGNOSIS — R911 Solitary pulmonary nodule: Secondary | ICD-10-CM

## 2013-06-30 ENCOUNTER — Ambulatory Visit
Admission: RE | Admit: 2013-06-30 | Discharge: 2013-06-30 | Disposition: A | Discharge status: Home / Self Care | Payer: Managed Care, Other (non HMO) | Source: Ambulatory Visit | Attending: Internal Medicine | Admitting: Internal Medicine

## 2013-06-30 DIAGNOSIS — E041 Nontoxic single thyroid nodule: Secondary | ICD-10-CM

## 2013-07-04 ENCOUNTER — Encounter: Payer: Self-pay | Admitting: Radiology

## 2013-07-04 ENCOUNTER — Ambulatory Visit
Admission: RE | Admit: 2013-07-04 | Discharge: 2013-07-04 | Disposition: A | Discharge status: Home / Self Care | Payer: Managed Care, Other (non HMO) | Source: Ambulatory Visit | Attending: Internal Medicine | Admitting: Internal Medicine

## 2013-07-04 DIAGNOSIS — R911 Solitary pulmonary nodule: Secondary | ICD-10-CM

## 2013-07-07 ENCOUNTER — Telehealth: Payer: Self-pay

## 2013-07-07 DIAGNOSIS — R918 Other nonspecific abnormal finding of lung field: Secondary | ICD-10-CM

## 2013-07-07 NOTE — Telephone Encounter (Signed)
PATIENT CALLING TO GET CT RESULTS AND ULTRASOUND FROM Friday 07/04/13 DR. GUEST REFERRED. PLEASE ADVISE. PATIENT WAS TOLD TO CALL AND GET RESULTS FROM OUR OFFICE SAYING THAT THEY ARE READY FROM [facility].   BEST: [phone]

## 2013-07-08 ENCOUNTER — Other Ambulatory Visit: Payer: Self-pay | Admitting: Radiology

## 2013-07-08 NOTE — Telephone Encounter (Signed)
Patient advised. Of [place] and CT scan.

## 2013-07-08 NOTE — Telephone Encounter (Signed)
[place] was normal. Letter was sent. Called patient to advise. Left message for him to call me back.

## 2013-07-31 ENCOUNTER — Other Ambulatory Visit: Payer: Self-pay | Admitting: Physician Assistant

## 2013-08-05 ENCOUNTER — Telehealth: Payer: Self-pay

## 2013-08-05 DIAGNOSIS — N529 Male erectile dysfunction, unspecified: Secondary | ICD-10-CM

## 2013-08-05 NOTE — Telephone Encounter (Signed)
Pharm reqs RFs of Cialis. I have pended Rx but have not put # of RFs in.

## 2013-08-07 MED ORDER — TADALAFIL 20 MG PO TABS: 20.0000 mg | ORAL_TABLET | Freq: Every day | ORAL | Status: DC | PRN

## 2013-08-07 NOTE — Telephone Encounter (Signed)
Ok to rf? 

## 2013-08-07 NOTE — Telephone Encounter (Signed)
Cialis #20, 3rf

## 2013-08-20 ENCOUNTER — Other Ambulatory Visit: Payer: Self-pay | Admitting: Internal Medicine

## 2013-10-20 ENCOUNTER — Encounter (INDEPENDENT_AMBULATORY_CARE_PROVIDER_SITE_OTHER): Payer: Self-pay | Admitting: Surgery

## 2013-10-20 ENCOUNTER — Encounter (INDEPENDENT_AMBULATORY_CARE_PROVIDER_SITE_OTHER): Payer: Self-pay

## 2013-10-20 ENCOUNTER — Ambulatory Visit (INDEPENDENT_AMBULATORY_CARE_PROVIDER_SITE_OTHER): Payer: Managed Care, Other (non HMO) | Admitting: Surgery

## 2013-10-20 VITALS — BP 140/86 | HR 72 | Temp 98.3°F | Resp 16 | Ht 71.0 in | Wt 210.4 lb

## 2013-10-20 DIAGNOSIS — K4091 Unilateral inguinal hernia, without obstruction or gangrene, recurrent: Secondary | ICD-10-CM

## 2013-10-20 NOTE — Progress Notes (Signed)
Patient ID: [name], male   DOB: 1950-11-22, 63 y.o.   MRN: [id number]  Chief Complaint  Patient presents with  . New Evaluation    hernia    HPI [name] is a 63 y.o. male.  Self-referred for possible recurrent right inguinal hernia PCP - Dr. Lou Miner HPI This is a 63 year old male who is status post open repair of a left inguinal hernia and umbilical hernia in March of 2008. In August of 2010 the patient had open repair of a very large pantaloon right inguinal hernia. He had been doing quite well until a couple of months ago. He was having a fit of coughing which he gets frequently secondary to reflux. He felt pain in his right groin. Subsequently he has developed some swelling in this area. He presents now for examination and possible treatment.  The patient recently underwent arthroscopy of his knee for a torn meniscus. He continues to have some swelling and soreness from that surgery. His orthopedic surgeon is Dr. Hart Robinsons.  Past Medical History  Diagnosis Date  . Allergy   . Asthma   . GERD (gastroesophageal reflux disease)   . Hypertension   . Arthritis     Past Surgical History  Procedure Laterality Date  . Hernia repair    . Knee surgery      Family History  Problem Relation Age of Onset  . Cancer Mother   . Cancer Father   . Cancer Sister   . Arthritis Sister     Social History History  Substance Use Topics  . Smoking status: Former Smoker    Quit date: 11/05/1999  . Smokeless tobacco: Not on file  . Alcohol Use: No    No Known Allergies  Current Outpatient Prescriptions  Medication Sig Dispense Refill  . celecoxib (CELEBREX) 200 MG capsule Take 1 capsule (200 mg total) by mouth 2 (two) times daily. NEED REFILLS  60 capsule  3  . cholecalciferol (VITAMIN D) 1000 UNITS tablet Take 2,000 Units by mouth daily.      . clobetasol ointment (TEMOVATE) 0.05 % Apply topically 2 (two) times daily. Apply bid to itchy rash, do not use on face,  genitals, axillae  60 g  1  . DEXILANT 60 MG capsule TAKE 1 CAPSULE BY MOUTH DAILY.  90 capsule  3  . fluticasone (FLONASE) 50 MCG/ACT nasal spray Place 2 sprays into the nose daily.  16 g  12  . lisinopril (PRINIVIL,ZESTRIL) 5 MG tablet Take 1 tablet (5 mg total) by mouth daily.  30 tablet  6  . tadalafil (CIALIS) 20 MG tablet Take 1 tablet (20 mg total) by mouth daily as needed for erectile dysfunction.  20 tablet  3   No current facility-administered medications for this visit.    Review of Systems Review of Systems  Constitutional: Negative for fever, chills and unexpected weight change.  HENT: Negative for congestion, hearing loss, sore throat, trouble swallowing and voice change.   Eyes: Negative for visual disturbance.  Respiratory: Negative for cough and wheezing.   Cardiovascular: Negative for chest pain, palpitations and leg swelling.  Gastrointestinal: Negative for nausea, vomiting, abdominal pain, diarrhea, constipation, blood in stool, abdominal distention, anal bleeding and rectal pain.  Genitourinary: Positive for testicular pain. Negative for hematuria and difficulty urinating.  Musculoskeletal: Positive for arthralgias and joint swelling.  Skin: Negative for rash and wound.  Neurological: Negative for seizures, syncope, weakness and headaches.  Hematological: Negative for adenopathy. Does not bruise/bleed easily.  Psychiatric/Behavioral: Negative for confusion.    Blood pressure 140/86, pulse 72, temperature 98.3 F (36.8 C), resp. rate 16, height 5\' 11"  (1.803 m), weight 210 lb 6.4 oz (95.437 kg).  Physical Exam Physical Exam WDWN in NAD HEENT:  EOMI, sclera anicteric Neck:  No masses, no thyromegaly Lungs:  CTA bilaterally; normal respiratory effort CV:  Regular rate and rhythm; no murmurs Abd:  +bowel sounds, soft, non-tender, no masses; well-healed umbilical incision with no sign of hernia GU:  Bilateral descended testes; no testicular masses; small reducible  right inguinal hernia; no sign of left inguinal hernia Ext:  Well-perfused; no edema Skin:  Warm, dry; no sign of jaundice  Data Reviewed none  Assessment    Recurrent right inguinal hernia     Plan    Open repair of recurrent right inguinal hernia, possibly with mesh.  The surgical procedure has been discussed with the patient.  Potential risks, benefits, alternative treatments, and expected outcomes have been explained.  All of the patient's questions at this time have been answered.  The likelihood of reaching the patient's treatment goal is good.  The patient understand the proposed surgical procedure and wishes to proceed.         Eurydice Calixto K. 10/20/2013, 9:32 AM

## 2013-11-12 ENCOUNTER — Other Ambulatory Visit: Payer: Self-pay

## 2013-11-12 DIAGNOSIS — N529 Male erectile dysfunction, unspecified: Secondary | ICD-10-CM

## 2013-11-12 MED ORDER — TADALAFIL 20 MG PO TABS: 20.0000 mg | ORAL_TABLET | Freq: Every day | ORAL | Status: DC | PRN

## 2013-11-12 NOTE — Telephone Encounter (Signed)
Pt would like to have his rx refill on cialis he adds that this rx is now available in generic at [facility].

## 2013-11-12 NOTE — Telephone Encounter (Signed)
Please advise 

## 2013-11-12 NOTE — Telephone Encounter (Signed)
Sent in

## 2013-11-14 ENCOUNTER — Other Ambulatory Visit (HOSPITAL_COMMUNITY): Payer: Self-pay | Admitting: Surgery

## 2013-11-14 NOTE — Progress Notes (Signed)
EKG, chest x ray and chest CT 10/14 EPIC

## 2013-11-14 NOTE — Telephone Encounter (Signed)
Ok rf

## 2013-11-17 ENCOUNTER — Encounter (HOSPITAL_COMMUNITY): Payer: Self-pay

## 2013-11-17 ENCOUNTER — Encounter (HOSPITAL_COMMUNITY)
Admission: RE | Admit: 2013-11-17 | Discharge: 2013-11-17 | Disposition: A | Discharge status: Home / Self Care | Payer: Managed Care, Other (non HMO) | Source: Ambulatory Visit | Attending: Surgery | Admitting: Surgery

## 2013-11-17 ENCOUNTER — Encounter (HOSPITAL_COMMUNITY): Payer: Self-pay | Admitting: Pharmacy Technician

## 2013-11-17 LAB — CBC
HCT: 46.4 % (ref 39.0–52.0)
Hemoglobin: 15.4 g/dL (ref 13.0–17.0)
MCH: 30 pg (ref 26.0–34.0)
MCHC: 33.2 g/dL (ref 30.0–36.0)
MCV: 90.3 fL (ref 78.0–100.0)
Platelets: 237 10*3/uL (ref 150–400)
RBC: 5.14 MIL/uL (ref 4.22–5.81)
RDW: 12.9 % (ref 11.5–15.5)
WBC: 4.9 10*3/uL (ref 4.0–10.5)

## 2013-11-17 LAB — BASIC METABOLIC PANEL
BUN: 9 mg/dL (ref 6–23)
CO2: 27 meq/L (ref 19–32)
Calcium: 9 mg/dL (ref 8.4–10.5)
Chloride: 101 mEq/L (ref 96–112)
Creatinine, Ser: 0.85 mg/dL (ref 0.50–1.35)
GFR calc Af Amer: >90 mL/min (ref >90)
GFR calc non Af Amer: >90 mL/min (ref >90)
GLUCOSE: 96 mg/dL (ref 70–99)
Potassium: 4 mEq/L (ref 3.7–5.3)
Sodium: 140 mEq/L (ref 137–147)

## 2013-11-17 NOTE — Telephone Encounter (Signed)
Pt CB and stated that he wanted to try the generic equivalent. Called [facility] to OK generic and was told that no exact generic alternative but the generic equivalent would be Revatio 20 mg. Can we Rx this for pt?

## 2013-11-17 NOTE — Patient Instructions (Addendum)
Chauncey  11/17/2013   Your procedure is scheduled on: Thursday March 5th  Report to [place] [facility] at 530 AM.  Call this number if you have problems the morning of surgery [phone]   Remember:  Do not eat food or drink liquids :After Midnight.     Take these medicines the morning of surgery with A SIP OF WATER: dexilant, flonase nasal spray if needed, opcon eye drop                                [place]             You may not have any metal on your body including hair pins and piercings  Do not wear jewelry, make-up.  Do not wear lotions, powders, or perfumes. No  Deodorant is to be worn.   Men may shave face and neck.  Do not bring valuables to the hospital. [place].  Contacts, dentures or bridgework may not be worn into surgery.  Leave suitcase in the car. After surgery it may be brought to your room.  For patients admitted to the hospital, checkout time is 11:00 AM the day of discharge.   Patients discharged the day of surgery will not be allowed to drive home.  Name and phone number of your driver: wife linda Villalon cell [phone]  Special Instructions: N/A  Please read over the following fact sheets that you were given: [facility] Preparing for surgery sheet. Call Zelphia Cairo RN pre op nurse if needed [phone]    FAILURE TO FOLLOW THESE INSTRUCTIONS MAY RESULT IN THE CANCELLATION OF YOUR SURGERY.  PATIENT SIGNATURE___________________________________________  NURSE SIGNATURE_____________________________________________

## 2013-11-17 NOTE — Telephone Encounter (Signed)
Notified pt on VM. 

## 2013-11-17 NOTE — Addendum Note (Signed)
Addended by: Elwyn Reach A on: 11/17/2013 11:46 AM   Modules accepted: Orders

## 2013-11-17 NOTE — Progress Notes (Signed)
Chest ct results 07-07-13 results  epic  sent by epic to dr [name] in [place] ekg 06-23-13 epic

## 2013-11-18 ENCOUNTER — Other Ambulatory Visit: Payer: Self-pay | Admitting: Internal Medicine

## 2013-11-20 ENCOUNTER — Encounter (HOSPITAL_COMMUNITY): Payer: Managed Care, Other (non HMO) | Admitting: Certified Registered Nurse Anesthetist

## 2013-11-20 ENCOUNTER — Ambulatory Visit (HOSPITAL_COMMUNITY)
Admission: RE | Admit: 2013-11-20 | Discharge: 2013-11-20 | Disposition: A | Discharge status: Home / Self Care | Payer: Managed Care, Other (non HMO) | Source: Ambulatory Visit | Attending: Surgery | Admitting: Surgery

## 2013-11-20 ENCOUNTER — Encounter (HOSPITAL_COMMUNITY): Payer: Self-pay | Admitting: *Deleted

## 2013-11-20 ENCOUNTER — Ambulatory Visit (HOSPITAL_COMMUNITY): Payer: Managed Care, Other (non HMO) | Admitting: Certified Registered Nurse Anesthetist

## 2013-11-20 ENCOUNTER — Encounter (HOSPITAL_COMMUNITY)
Admission: RE | Disposition: A | Discharge status: Home / Self Care | Payer: Self-pay | Source: Ambulatory Visit | Attending: Surgery

## 2013-11-20 DIAGNOSIS — K4091 Unilateral inguinal hernia, without obstruction or gangrene, recurrent: Secondary | ICD-10-CM

## 2013-11-20 DIAGNOSIS — I10 Essential (primary) hypertension: Secondary | ICD-10-CM | POA: Insufficient documentation

## 2013-11-20 DIAGNOSIS — K219 Gastro-esophageal reflux disease without esophagitis: Secondary | ICD-10-CM | POA: Insufficient documentation

## 2013-11-20 DIAGNOSIS — Z87891 Personal history of nicotine dependence: Secondary | ICD-10-CM | POA: Insufficient documentation

## 2013-11-20 DIAGNOSIS — Z79899 Other long term (current) drug therapy: Secondary | ICD-10-CM | POA: Insufficient documentation

## 2013-11-20 HISTORY — PX: INSERTION OF MESH: SHX5868

## 2013-11-20 HISTORY — PX: INGUINAL HERNIA REPAIR: SHX194

## 2013-11-20 SURGERY — REPAIR, HERNIA, INGUINAL, ADULT
Anesthesia: General | Site: Groin | Laterality: Right

## 2013-11-20 MED ORDER — BUPIVACAINE-EPINEPHRINE PF 0.25-1:200000 % IJ SOLN
INTRAMUSCULAR | Status: AC
  Filled 2013-11-20: qty 30

## 2013-11-20 MED ORDER — PROPOFOL 10 MG/ML IV BOLUS
INTRAVENOUS | Status: AC
  Filled 2013-11-20: qty 20

## 2013-11-20 MED ORDER — ONDANSETRON HCL 4 MG/2ML IJ SOLN
INTRAMUSCULAR | Status: DC | PRN
  Administered 2013-11-20: 4 mg via INTRAVENOUS

## 2013-11-20 MED ORDER — MIDAZOLAM HCL 2 MG/2ML IJ SOLN
INTRAMUSCULAR | Status: AC
  Filled 2013-11-20: qty 2

## 2013-11-20 MED ORDER — CEFAZOLIN SODIUM-DEXTROSE 2-3 GM-% IV SOLR
INTRAVENOUS | Status: AC
  Filled 2013-11-20: qty 50

## 2013-11-20 MED ORDER — MIDAZOLAM HCL 5 MG/5ML IJ SOLN
INTRAMUSCULAR | Status: DC | PRN
  Administered 2013-11-20: 2 mg via INTRAVENOUS

## 2013-11-20 MED ORDER — FENTANYL CITRATE 0.05 MG/ML IJ SOLN
INTRAMUSCULAR | Status: DC | PRN
  Administered 2013-11-20: 50 ug via INTRAVENOUS
  Administered 2013-11-20: 100 ug via INTRAVENOUS

## 2013-11-20 MED ORDER — KETOROLAC TROMETHAMINE 30 MG/ML IJ SOLN
INTRAMUSCULAR | Status: AC
  Filled 2013-11-20: qty 1

## 2013-11-20 MED ORDER — LACTATED RINGERS IV SOLN
INTRAVENOUS | Status: DC | PRN
  Administered 2013-11-20: 07:00:00 via INTRAVENOUS

## 2013-11-20 MED ORDER — LACTATED RINGERS IV SOLN: INTRAVENOUS | Status: DC

## 2013-11-20 MED ORDER — PROMETHAZINE HCL 25 MG/ML IJ SOLN
6.2500 mg | INTRAMUSCULAR | Status: DC | PRN
Start: 2013-11-20 — End: 2013-11-20

## 2013-11-20 MED ORDER — PROPOFOL 10 MG/ML IV BOLUS
INTRAVENOUS | Status: DC | PRN
  Administered 2013-11-20: 170 mg via INTRAVENOUS

## 2013-11-20 MED ORDER — HYDROMORPHONE HCL 2 MG PO TABS
2.0000 mg | ORAL_TABLET | ORAL | Status: DC | PRN
  Administered 2013-11-20: 2 mg via ORAL
  Filled 2013-11-20: qty 1

## 2013-11-20 MED ORDER — HYDROMORPHONE HCL 2 MG PO TABS: 2.0000 mg | ORAL_TABLET | ORAL | Status: DC | PRN

## 2013-11-20 MED ORDER — 0.9 % SODIUM CHLORIDE (POUR BTL) OPTIME
TOPICAL | Status: DC | PRN
  Administered 2013-11-20: 1000 mL

## 2013-11-20 MED ORDER — BUPIVACAINE-EPINEPHRINE 0.25% -1:200000 IJ SOLN
INTRAMUSCULAR | Status: DC | PRN
  Administered 2013-11-20: 10 mL

## 2013-11-20 MED ORDER — FENTANYL CITRATE 0.05 MG/ML IJ SOLN
INTRAMUSCULAR | Status: AC
  Filled 2013-11-20: qty 2

## 2013-11-20 MED ORDER — FENTANYL CITRATE 0.05 MG/ML IJ SOLN
INTRAMUSCULAR | Status: AC
  Filled 2013-11-20: qty 5

## 2013-11-20 MED ORDER — ONDANSETRON HCL 4 MG/2ML IJ SOLN: 4.0000 mg | INTRAMUSCULAR | Status: DC | PRN

## 2013-11-20 MED ORDER — SUCCINYLCHOLINE CHLORIDE 20 MG/ML IJ SOLN
INTRAMUSCULAR | Status: DC | PRN
  Administered 2013-11-20: 100 mg via INTRAVENOUS

## 2013-11-20 MED ORDER — FENTANYL CITRATE 0.05 MG/ML IJ SOLN
25.0000 ug | INTRAMUSCULAR | Status: DC | PRN
  Administered 2013-11-20 (×2): 25 ug via INTRAVENOUS

## 2013-11-20 MED ORDER — HYDROMORPHONE HCL PF 1 MG/ML IJ SOLN: 1.0000 mg | INTRAMUSCULAR | Status: DC | PRN

## 2013-11-20 MED ORDER — KETOROLAC TROMETHAMINE 30 MG/ML IJ SOLN
INTRAMUSCULAR | Status: DC | PRN
  Administered 2013-11-20: 30 mg via INTRAVENOUS

## 2013-11-20 MED ORDER — MEPERIDINE HCL 50 MG/ML IJ SOLN: 6.2500 mg | INTRAMUSCULAR | Status: DC | PRN

## 2013-11-20 MED ORDER — CEFAZOLIN SODIUM-DEXTROSE 2-3 GM-% IV SOLR
2.0000 g | INTRAVENOUS | Status: AC
  Administered 2013-11-20: 2 g via INTRAVENOUS

## 2013-11-20 MED ORDER — ONDANSETRON HCL 4 MG/2ML IJ SOLN
INTRAMUSCULAR | Status: AC
  Filled 2013-11-20: qty 2

## 2013-11-20 SURGICAL SUPPLY — 40 items
APL SKNCLS STERI-STRIP NONHPOA (GAUZE/BANDAGES/DRESSINGS) ×1
BENZOIN TINCTURE PRP APPL 2/3 (GAUZE/BANDAGES/DRESSINGS) ×3 IMPLANT
BLADE HEX COATED 2.75 (ELECTRODE) ×3 IMPLANT
CHLORAPREP W/TINT 26ML (MISCELLANEOUS) ×3 IMPLANT
CLOSURE STERI-STRIP 1/4X4 (GAUZE/BANDAGES/DRESSINGS) ×2 IMPLANT
CLOSURE WOUND 1/2 X4 (GAUZE/BANDAGES/DRESSINGS)
DECANTER SPIKE VIAL GLASS SM (MISCELLANEOUS) ×3 IMPLANT
DISSECTOR ROUND CHERRY 3/8 STR (MISCELLANEOUS) ×2 IMPLANT
DRAIN PENROSE 18X1/2 LTX STRL (DRAIN) ×2 IMPLANT
DRAPE LAPAROTOMY TRNSV 102X78 (DRAPE) ×3 IMPLANT
DRAPE UTILITY XL STRL (DRAPES) ×3 IMPLANT
DRSG TEGADERM 4X4.75 (GAUZE/BANDAGES/DRESSINGS) ×2 IMPLANT
ELECT REM PT RETURN 9FT ADLT (ELECTROSURGICAL) ×3
ELECTRODE REM PT RTRN 9FT ADLT (ELECTROSURGICAL) ×1 IMPLANT
GAUZE SPONGE 4X4 16PLY XRAY LF (GAUZE/BANDAGES/DRESSINGS) ×2 IMPLANT
GLOVE BIO SURGEON STRL SZ7 (GLOVE) ×3 IMPLANT
GLOVE BIOGEL PI IND STRL 7.5 (GLOVE) ×1 IMPLANT
GLOVE BIOGEL PI INDICATOR 7.5 (GLOVE) ×2
GOWN STRL REUS W/TWL XL LVL3 (GOWN DISPOSABLE) ×3 IMPLANT
KIT BASIN OR (CUSTOM PROCEDURE TRAY) ×3 IMPLANT
MESH ULTRAPRO 3X6 7.6X15CM (Mesh General) ×2 IMPLANT
NDL HYPO 25X1 1.5 SAFETY (NEEDLE) ×1 IMPLANT
NEEDLE HYPO 25X1 1.5 SAFETY (NEEDLE) ×3 IMPLANT
PACK GENERAL/GYN (CUSTOM PROCEDURE TRAY) ×3 IMPLANT
SPONGE GAUZE 4X4 12PLY (GAUZE/BANDAGES/DRESSINGS) ×3 IMPLANT
STRIP CLOSURE SKIN 1/2X4 (GAUZE/BANDAGES/DRESSINGS) ×1 IMPLANT
SUT MNCRL AB 4-0 PS2 18 (SUTURE) ×3 IMPLANT
SUT PROLENE 2 0 SH DA (SUTURE) ×2 IMPLANT
SUT SILK 3 0 (SUTURE)
SUT SILK 3-0 18XBRD TIE 12 (SUTURE) IMPLANT
SUT VIC AB 2-0 CT2 27 (SUTURE) IMPLANT
SUT VIC AB 2-0 SH 27 (SUTURE) ×3
SUT VIC AB 2-0 SH 27X BRD (SUTURE) ×1 IMPLANT
SUT VIC AB 3-0 SH 27 (SUTURE) ×6
SUT VIC AB 3-0 SH 27X BRD (SUTURE) ×1 IMPLANT
SUT VICRYL 0 27 CT2 27 ABS (SUTURE) IMPLANT
SUT VICRYL 0 UR6 27IN ABS (SUTURE) IMPLANT
SYR 20CC LL (SYRINGE) ×3 IMPLANT
TOWEL OR 17X26 10 PK STRL BLUE (TOWEL DISPOSABLE) ×3 IMPLANT
TOWEL OR NON WOVEN STRL DISP B (DISPOSABLE) ×3 IMPLANT

## 2013-11-20 NOTE — H&P (Signed)
Chief Complaint   Patient presents with   .  New Evaluation     hernia   HPI  [name] is a 63 y.o. male. Self-referred for possible recurrent right inguinal hernia  PCP - Dr. Lou Miner  HPI  This is a 63 year old male who is status post open repair of a left inguinal hernia and umbilical hernia in March of 2008. In August of 2010 the patient had open repair of a very large pantaloon right inguinal hernia. He had been doing quite well until a couple of months ago. He was having a fit of coughing which he gets frequently secondary to reflux. He felt pain in his right groin. Subsequently he has developed some swelling in this area. He presents now for examination and possible treatment.  The patient recently underwent arthroscopy of his knee for a torn meniscus. He continues to have some swelling and soreness from that surgery. His orthopedic surgeon is Dr. Hart Robinsons.  Past Medical History   Diagnosis  Date   .  Allergy    .  Asthma    .  GERD (gastroesophageal reflux disease)    .  Hypertension    .  Arthritis     Past Surgical History   Procedure  Laterality  Date   .  Hernia repair     .  Knee surgery      Family History   Problem  Relation  Age of Onset   .  Cancer  Mother    .  Cancer  Father    .  Cancer  Sister    .  Arthritis  Sister    Social History  History   Substance Use Topics   .  Smoking status:  Former Smoker     Quit date:  11/05/1999   .  Smokeless tobacco:  Not on file   .  Alcohol Use:  No   No Known Allergies  Current Outpatient Prescriptions   Medication  Sig  Dispense  Refill   .  celecoxib (CELEBREX) 200 MG capsule  Take 1 capsule (200 mg total) by mouth 2 (two) times daily. NEED REFILLS  60 capsule  3   .  cholecalciferol (VITAMIN D) 1000 UNITS tablet  Take 2,000 Units by mouth daily.     .  clobetasol ointment (TEMOVATE) 0.05 %  Apply topically 2 (two) times daily. Apply bid to itchy rash, do not use on face, genitals, axillae  60 g  1    .  DEXILANT 60 MG capsule  TAKE 1 CAPSULE BY MOUTH DAILY.  90 capsule  3   .  fluticasone (FLONASE) 50 MCG/ACT nasal spray  Place 2 sprays into the nose daily.  16 g  12   .  lisinopril (PRINIVIL,ZESTRIL) 5 MG tablet  Take 1 tablet (5 mg total) by mouth daily.  30 tablet  6   .  tadalafil (CIALIS) 20 MG tablet  Take 1 tablet (20 mg total) by mouth daily as needed for erectile dysfunction.  20 tablet  3    No current facility-administered medications for this visit.   Review of Systems  Review of Systems  Constitutional: Negative for fever, chills and unexpected weight change.  HENT: Negative for congestion, hearing loss, sore throat, trouble swallowing and voice change.  Eyes: Negative for visual disturbance.  Respiratory: Negative for cough and wheezing.  Cardiovascular: Negative for chest pain, palpitations and leg swelling.  Gastrointestinal: Negative for nausea, vomiting, abdominal pain, diarrhea,  constipation, blood in stool, abdominal distention, anal bleeding and rectal pain.  Genitourinary: Positive for testicular pain. Negative for hematuria and difficulty urinating.  Musculoskeletal: Positive for arthralgias and joint swelling.  Skin: Negative for rash and wound.  Neurological: Negative for seizures, syncope, weakness and headaches.  Hematological: Negative for adenopathy. Does not bruise/bleed easily.  Psychiatric/Behavioral: Negative for confusion.  Blood pressure 140/86, pulse 72, temperature 98.3 F (36.8 C), resp. rate 16, height 5\' 11"  (1.803 m), weight 210 lb 6.4 oz (95.437 kg).  Physical Exam  Physical Exam  WDWN in NAD  HEENT: EOMI, sclera anicteric  Neck: No masses, no thyromegaly  Lungs: CTA bilaterally; normal respiratory effort  CV: Regular rate and rhythm; no murmurs  Abd: +bowel sounds, soft, non-tender, no masses; well-healed umbilical incision with no sign of hernia  GU: Bilateral descended testes; no testicular masses; small reducible right inguinal hernia;  no sign of left inguinal hernia  Ext: Well-perfused; no edema  Skin: Warm, dry; no sign of jaundice  Data Reviewed  none  Assessment  Recurrent right inguinal hernia  Plan  Open repair of recurrent right inguinal hernia, possibly with mesh. The surgical procedure has been discussed with the patient. Potential risks, benefits, alternative treatments, and expected outcomes have been explained. All of the patient's questions at this time have been answered. The likelihood of reaching the patient's treatment goal is good. The patient understand the proposed surgical procedure and wishes to proceed.    [name]. [name], MD, [facility] Surgery  General/ Trauma Surgery  11/20/2013 7:16 AM

## 2013-11-20 NOTE — Anesthesia Postprocedure Evaluation (Signed)
  Anesthesia Post-op Note  Patient: [name]  Procedure(s) Performed: Procedure(s) (LRB): OPEN REPAIR OF RECURRENT RIGHT INGUINAL HERNIA WITH MESH (Right) INSERTION OF MESH (Right)  Patient Location: PACU  Anesthesia Type: General  Level of Consciousness: awake and alert   Airway and Oxygen Therapy: Patient Spontanous Breathing  Post-op Pain: mild  Post-op Assessment: Post-op Vital signs reviewed, Patient's Cardiovascular Status Stable, Respiratory Function Stable, Patent Airway and No signs of Nausea or vomiting  Last Vitals:  Filed Vitals:   11/20/13 1104  BP: 132/77  Pulse: 58  Temp:   Resp:     Post-op Vital Signs: stable   Complications: No apparent anesthesia complications

## 2013-11-20 NOTE — Transfer of Care (Signed)
Immediate Anesthesia Transfer of Care Note  Patient: [name]  Procedure(s) Performed: Procedure(s): OPEN REPAIR OF RECURRENT RIGHT INGUINAL HERNIA WITH MESH (Right) INSERTION OF MESH (Right)  Patient Location: PACU  Anesthesia Type:General  Level of Consciousness: awake and alert   Airway & Oxygen Therapy: Patient Spontanous Breathing and Patient connected to face mask oxygen  Post-op Assessment: Report given to PACU RN and Post -op Vital signs reviewed and stable  Post vital signs: Reviewed and stable  Complications: No apparent anesthesia complications

## 2013-11-20 NOTE — Anesthesia Preprocedure Evaluation (Addendum)
Anesthesia Evaluation  Patient identified by MRN, date of birth, ID band Patient awake    Reviewed: Allergy & Precautions, H&P , NPO status , Patient's Chart, lab work & pertinent test results  Airway Mallampati: II TM Distance: >3 FB Neck ROM: Full    Dental no notable dental hx.    Pulmonary former smoker,  breath sounds clear to auscultation  Pulmonary exam normal       Cardiovascular hypertension, Pt. on medications Rhythm:Regular Rate:Normal     Neuro/Psych negative neurological ROS  negative psych ROS   GI/Hepatic negative GI ROS, Neg liver ROS,   Endo/Other  negative endocrine ROS  Renal/GU negative Renal ROS  negative genitourinary   Musculoskeletal negative musculoskeletal ROS (+)   Abdominal   Peds negative pediatric ROS (+)  Hematology negative hematology ROS (+)   Anesthesia Other Findings   Reproductive/Obstetrics negative OB ROS                          Anesthesia Physical Anesthesia Plan  ASA: II  Anesthesia Plan: General   Post-op Pain Management:    Induction: Intravenous  Airway Management Planned: LMA and Oral ETT  Additional Equipment:   Intra-op Plan:   Post-operative Plan: Extubation in OR  Informed Consent: I have reviewed the patients History and Physical, chart, labs and discussed the procedure including the risks, benefits and alternatives for the proposed anesthesia with the patient or authorized representative who has indicated his/her understanding and acceptance.   Dental advisory given  Plan Discussed with: CRNA  Anesthesia Plan Comments:        Anesthesia Quick Evaluation

## 2013-11-20 NOTE — Op Note (Signed)
Hernia, Open, Procedure Note  Indications: The patient presented with a history of a recurrent right, reducible inguinal hernia.    Pre-operative Diagnosis: Recurrent right reducible inguinal hernia Post-operative Diagnosis: same  Surgeon: Maia Petties.   Assistants: none  Anesthesia: General endotracheal anesthesia  ASA Class: 2  Procedure Details  The patient was seen again in the Holding Room. The risks, benefits, complications, treatment options, and expected outcomes were discussed with the patient. The possibilities of reaction to medication, pulmonary aspiration, perforation of viscus, bleeding, recurrent infection, the need for additional procedures, and development of a complication requiring transfusion or further operation were discussed with the patient and/or family. The likelihood of success in repairing the hernia and returning the patient to their previous functional status is good.  There was concurrence with the proposed plan, and informed consent was obtained. The site of surgery was properly noted/marked. The patient was taken to the Operating Room, identified as [name], and the procedure verified as right inguinal hernia repair. A Time Out was held and the above information confirmed.  The patient was placed in the supine position and underwent induction of anesthesia. The lower abdomen and groin was prepped with Chloraprep and draped in the standard fashion, and 0.25% Marcaine with epinephrine was used to anesthetize the skin around the incision. An oblique incision was made. Dissection was carried down through the subcutaneous tissue with cautery to the external oblique fascia.  There was very significant scarring in the subcutaneous tissue anterior to the external oblique fasciaWe opened the external oblique fascia along the direction of its fibers to the external ring.  The old mesh was densely adherent to the fascia.  We carefully dissected the mesh away from  the fascia  The spermatic cord was circumferentially dissected bluntly and retracted with a Penrose drain.   The floor of the inguinal canal was inspected and the mesh was intact.  The internal ring was loose and a small indirect hernia had recurred.  We used an Ultrapro mesh which was cut into a keyhole shape.  The mesh was secured to the pubic tubercle with 2-0 Prolene.  The suture was run along the internal oblique fascia superiorly and the shelving edge inferiorly. The mesh was tucked underneath the external oblique fascia laterally.  The external oblique fascia was reapproximated with 2-0 Vicryl.  3-0 Vicryl was used to close the subcutaneous tissues and 4-0 Monocryl was used to close the skin in subcuticular fashion.  Benzoin and steri-strips were used to seal the incision.  A clean dressing was applied.  The patient was then extubated and brought to the recovery room in stable condition.  All sponge, instrument, and needle counts were correct prior to closure and at the conclusion of the case.   Estimated Blood Loss: less than 50 mL                 Complications: None; patient tolerated the procedure well.         Disposition: PACU - hemodynamically stable.         Condition: stable  [name]. [name], MD, [facility] Surgery  General/ Trauma Surgery  11/20/2013 9:16 AM

## 2013-11-21 ENCOUNTER — Encounter (HOSPITAL_COMMUNITY): Payer: Self-pay | Admitting: Surgery

## 2013-12-12 ENCOUNTER — Ambulatory Visit (INDEPENDENT_AMBULATORY_CARE_PROVIDER_SITE_OTHER): Payer: Managed Care, Other (non HMO) | Admitting: Surgery

## 2013-12-12 ENCOUNTER — Encounter (INDEPENDENT_AMBULATORY_CARE_PROVIDER_SITE_OTHER): Payer: Self-pay | Admitting: Surgery

## 2013-12-12 VITALS — BP 132/86 | HR 78 | Temp 98.5°F | Resp 16 | Ht 71.0 in | Wt 212.0 lb

## 2013-12-12 DIAGNOSIS — K4091 Unilateral inguinal hernia, without obstruction or gangrene, recurrent: Secondary | ICD-10-CM

## 2013-12-12 NOTE — Progress Notes (Signed)
Status post open repair recurrent right hernia on 11/20/13. The patient had a recurrence medially at the pubic tubercle. We repaired this with another piece of onlay mesh. He has significant swelling under the incision as well as down into the scrotum. This may represent some hematoma or seroma. The swelling and tenderness seemed to be slowly improving although said he still has significant tenderness of the scrotum.  Appetite and bowel movements are normal. He is using stool softeners to avoid constipation.  Filed Vitals:   12/12/13 1032  BP: 132/86  Pulse: 78  Temp: 98.5 F (36.9 C)  Resp: 16   His incision is well-healed with no sign of infection. He does have some swelling underneath the incision as well as down in the scrotum but no bruising in this area. No fluctuance noted. No sign of recurrent hernia.  This deep hematoma should resolve with time. He is on a prescription strength NSAID for his left knee already. She continue limiting his level of activity to lifting no more than 20 pounds. Followup 3 weeks for recheck.  [name]. [name], MD, [facility] Surgery  General/ Trauma Surgery  12/12/2013 12:07 PM

## 2013-12-24 ENCOUNTER — Other Ambulatory Visit: Payer: Self-pay

## 2013-12-24 NOTE — Telephone Encounter (Signed)
Patient would like to switch to a generic brand of cialis called sadenafil? instead, he wants 20mg . He says cialis is too expensive. Patient see's Dr. Elder Cyphers. Pharmacy is [facility] on [address].

## 2013-12-24 NOTE — Telephone Encounter (Signed)
Dr Elder Cyphers, do you want to change Rx to sildenafil 20 mg which is generic for viagra, not cialis?

## 2013-12-26 MED ORDER — SILDENAFIL CITRATE 20 MG PO TABS: ORAL_TABLET | ORAL | Status: DC

## 2013-12-26 NOTE — Telephone Encounter (Signed)
Ok to rf? 

## 2013-12-29 ENCOUNTER — Ambulatory Visit: Payer: Managed Care, Other (non HMO) | Admitting: Internal Medicine

## 2013-12-31 NOTE — Telephone Encounter (Signed)
Advised [name] that Dr Elder Cyphers had sent in Rx but that he needs to RTC to discuss before runs out or if med doesn't work. [name] stated that he thinks he needs a higher strength and will come to see Dr Elder Cyphers next week when he's at walk in.

## 2013-12-31 NOTE — Telephone Encounter (Signed)
rtc and discuss

## 2014-01-05 ENCOUNTER — Ambulatory Visit
Admission: RE | Admit: 2014-01-05 | Discharge: 2014-01-05 | Disposition: A | Discharge status: Home / Self Care | Payer: Managed Care, Other (non HMO) | Source: Ambulatory Visit | Attending: Internal Medicine | Admitting: Internal Medicine

## 2014-01-05 DIAGNOSIS — R918 Other nonspecific abnormal finding of lung field: Secondary | ICD-10-CM

## 2014-01-06 ENCOUNTER — Ambulatory Visit (INDEPENDENT_AMBULATORY_CARE_PROVIDER_SITE_OTHER): Payer: Managed Care, Other (non HMO) | Admitting: Surgery

## 2014-01-06 ENCOUNTER — Encounter (INDEPENDENT_AMBULATORY_CARE_PROVIDER_SITE_OTHER): Payer: Self-pay | Admitting: Surgery

## 2014-01-06 VITALS — BP 160/90 | HR 84 | Temp 98.4°F | Resp 16 | Ht 71.0 in | Wt 213.6 lb

## 2014-01-06 DIAGNOSIS — K4091 Unilateral inguinal hernia, without obstruction or gangrene, recurrent: Secondary | ICD-10-CM

## 2014-01-06 NOTE — Progress Notes (Signed)
Status post open repair of recurrent right inguinal hernia 11/20/13. The patient returns for his second postoperative visit. The swelling in his groin in the right hemiscrotum has gone down significantly. He still has a lot of sensitivity in his external ring radiating down to his testicle. On examination his incision is well-healed with no sign of infection. No sign of recurrent hernia. He has some point tenderness at his external ring that radiates down towards his testicle. There is no swelling of the right hemiscrotum.  We will keep him out of work for 2 more weeks to allow the tenderness did resolve. He may then return to work at full duty. Followup when necessary.  [name]. [name], MD, [facility] Surgery  General/ Trauma Surgery  01/06/2014 3:47 PM

## 2014-01-09 ENCOUNTER — Telehealth: Payer: Self-pay

## 2014-01-09 NOTE — Telephone Encounter (Signed)
Patient states he received a copy of his CT report in the mail today.  He does not understand the findings.  I reviewed the CT report with patient.  He said he wants to speak with Dr. Elder Cyphers personally regarding the test.  Gave him Dr. Guinevere Scarlet hours and he said he will come into the office.

## 2014-01-09 NOTE — Telephone Encounter (Signed)
Patient is anxiously awaiting the results from his test.   Please call [phone]

## 2014-01-13 ENCOUNTER — Ambulatory Visit (INDEPENDENT_AMBULATORY_CARE_PROVIDER_SITE_OTHER): Payer: Managed Care, Other (non HMO) | Admitting: Internal Medicine

## 2014-01-13 VITALS — BP 138/88 | HR 88 | Temp 98.2°F | Resp 18 | Ht 68.0 in | Wt 212.0 lb

## 2014-01-13 DIAGNOSIS — R9389 Abnormal findings on diagnostic imaging of other specified body structures: Secondary | ICD-10-CM

## 2014-01-13 DIAGNOSIS — Z7689 Persons encountering health services in other specified circumstances: Secondary | ICD-10-CM

## 2014-01-13 DIAGNOSIS — R911 Solitary pulmonary nodule: Secondary | ICD-10-CM

## 2014-01-13 NOTE — Progress Notes (Signed)
   Subjective:    Patient ID: [name], male    DOB: [birth year], 63 y.o.   MRN: [id number]  HPI 63 yr old is here to discuss CT scan results. He states he does feel better since starting back taking his blood pressure medication.  CT scan reviewed in detail, has new nodule appears benign. No hemoptysis. Does not breath deep or exercise  Review of Systems     Objective:   Physical Exam  Constitutional: He is oriented to person, place, and time. He appears well-developed and well-nourished. No distress.  HENT:  Head: Normocephalic.  Eyes: EOM are normal.  Cardiovascular: Normal rate, regular rhythm and normal heart sounds.   Pulmonary/Chest: Effort normal. No accessory muscle usage. Not tachypneic. He has no decreased breath sounds. He has no wheezes. He has rhonchi. He has no rales.  Musculoskeletal: Normal range of motion.  Neurological: He is alert and oriented to person, place, and time. He exhibits normal muscle tone. Coordination normal.  Psychiatric: He has a normal mood and affect.          Assessment & Plan:  Pulmonary nodules Refer to pulmonary to follow Deep breathing exercises/exercise to improve lungs

## 2014-01-13 NOTE — Patient Instructions (Signed)
Pulmonary Nodule °A pulmonary nodule is a small, round growth of tissue in the lung. Pulmonary nodules can range in size from less than 1/5 inch (4 mm) to a little bigger than an inch (25 mm). Most pulmonary nodules are detected when imaging tests of the lung are being performed for a different problem. Pulmonary nodules are usually not cancerous (benign). However, some pulmonary nodules are cancerous (malignant). Follow-up treatment or testing is based on the size of the pulmonary nodule and your risk of getting lung cancer.  °CAUSES °Benign pulmonary nodules can be caused by various things. Some of the causes include:  °· Bacterial, fungal, or viral infections. This is usually an old infection that is no longer active, but it can sometimes be a current, active infection. °· A benign mass of tissue. °· Inflammation from conditions such as rheumatoid arthritis.   °· Abnormal blood vessels in the lungs. °Malignant pulmonary nodules can result from lung cancer or from cancers that spread to the lung from other places in the body. °SIGNS AND SYMPTOMS °Pulmonary nodules usually do not cause symptoms. °DIAGNOSIS °Most often, pulmonary nodules are found incidentally when an X-ray or CT scan is performed to look for some other problem in the lung area. To help determine whether a pulmonary nodule is benign or malignant, your health care provider will take a medical history and order a variety of tests. Tests done may include:  °· Blood tests. °· A skin test called a tuberculin test. This test is used to determine if you have been exposed to the germ that causes tuberculosis.   °· Chest X-rays. If possible, a new X-ray may be compared with X-rays you have had in the past.    °· CT scan. This test shows smaller pulmonary nodules more clearly than an X-ray.   °· Positron emission tomography (PET) scan. In this test, a safe amount of a radioactive substance is injected into the bloodstream. Then, the scan takes a picture of  the pulmonary nodule. The radioactive substance is eliminated from your body in your urine.   °· Biopsy. A tiny piece of the pulmonary nodule is removed so it can be checked under a microscope. °TREATMENT  °Pulmonary nodules that are benign normally do not require any treatment because they usually do not cause symptoms or breathing problems. Your health care provider may want to monitor the pulmonary nodule through follow-up CT scans. The frequency of these CT scans will vary based on the size of the nodule and the risk factors for lung cancer. For example, CT scans will need to be done more frequently if the pulmonary nodule is larger and if you have a history of smoking and a family history of cancer. Further testing or biopsies may be done if any follow-up CT scan shows that the size of the pulmonary nodule has increased. °HOME CARE INSTRUCTIONS °· Only take over-the-counter or prescription medicines as directed by your health care provider. °· Keep all follow-up appointments with your health care provider. °SEEK MEDICAL CARE IF: °· You have trouble breathing when you are active.   °· You feel sick or unusually tired.   °· You do not feel like eating.   °· You lose weight without trying to.   °· You develop chills or night sweats.   °SEEK IMMEDIATE MEDICAL CARE IF: °· You cannot catch your breath, or you begin wheezing.   °· You cannot stop coughing.   °· You cough up blood.   °· You become dizzy or feel like you are going to pass out.   °· You   have sudden chest pain.   °· You have a fever or persistent symptoms for more than 2 3 days.   °· You have a fever and your symptoms suddenly get worse. °MAKE SURE YOU: °· Understand these instructions. °· Will watch your condition. °· Will get help right away if you are not doing well or get worse. °Document Released: 07/02/2009 Document Revised: 05/07/2013 Document Reviewed: 02/24/2013 °[employer]® Patient Information ©2014 [employer]. ° °

## 2014-02-02 ENCOUNTER — Institutional Professional Consult (permissible substitution): Payer: Self-pay | Admitting: Pulmonary Disease

## 2014-02-23 ENCOUNTER — Encounter: Payer: Self-pay | Admitting: Emergency Medicine

## 2014-02-23 ENCOUNTER — Ambulatory Visit (INDEPENDENT_AMBULATORY_CARE_PROVIDER_SITE_OTHER): Payer: Managed Care, Other (non HMO) | Admitting: Emergency Medicine

## 2014-02-23 VITALS — BP 138/86 | HR 101 | Ht 71.0 in | Wt 212.0 lb

## 2014-02-23 DIAGNOSIS — R918 Other nonspecific abnormal finding of lung field: Secondary | ICD-10-CM

## 2014-02-23 DIAGNOSIS — F17201 Nicotine dependence, unspecified, in remission: Secondary | ICD-10-CM

## 2014-02-23 DIAGNOSIS — F17200 Nicotine dependence, unspecified, uncomplicated: Secondary | ICD-10-CM

## 2014-02-23 NOTE — Progress Notes (Signed)
Subjective:    Patient ID: [name], male    DOB: 09/27/1950, 63 y.o.   MRN: [id number]  HPI 63 yo former smoker (30 pk-yrs), HTN, GERD, allergies, OA. Referred today for pulmonary nodules on CT chest.  He underwent CXR in 06/2014 that showed possible nodule, prompted CT chest that confirmed scattered sub-53mm nodules. Repeat CT 4/15 showed that the nodules are stable, also a new small area of GGI. He has some occasional wheeze, is able to stay active. He occasionally gets choked on her own secretions. He is on ACE-i.    Review of Systems  Constitutional: Negative for fever and unexpected weight change.  HENT: Positive for sinus pressure and trouble swallowing. Negative for congestion, dental problem, ear pain, nosebleeds, postnasal drip, rhinorrhea, sneezing and sore throat.   Eyes: Negative for redness and itching.  Respiratory: Positive for shortness of breath and wheezing. Negative for cough and chest tightness.   Cardiovascular: Positive for leg swelling. Negative for palpitations.       Knees   Gastrointestinal: Negative for nausea and vomiting.  Genitourinary: Negative for dysuria.  Musculoskeletal: Negative for joint swelling.  Skin: Negative for rash.  Neurological: Negative for headaches.  Hematological: Does not bruise/bleed easily.  Psychiatric/Behavioral: Negative for dysphoric mood. The patient is not nervous/anxious.     Past Medical History  Diagnosis Date  . Allergy     seasonal  . GERD (gastroesophageal reflux disease)   . Hypertension   . Arthritis     oa     Family History  Problem Relation Age of Onset  . Cancer Mother   . Cancer Father   . Cancer Sister   . Arthritis Sister      History   Social History  . Marital Status: Married    Spouse Name: N/A    Number of Children: N/A  . Years of Education: N/A   Occupational History  . Not on file.   Social History Main Topics  . Smoking status: Former Smoker -- 1.50 packs/day for 20 years      Types: Cigarettes    Quit date: 11/05/1999  . Smokeless tobacco: Never Used  . Alcohol Use: No  . Drug Use: Yes    Special: Cocaine     Comment: last used cocaine  and acid 24 yrs ago,none used since  . Sexual Activity: Not on file   Other Topics Concern  . Not on file   Social History Narrative  . No narrative on file  He works Copy, physical job.  He was exposed to metal dust, no welding.  He used to work as a Engineer, petroleum He lived in [place], [place], [place].  Possible TB exposure in the 70's  Allergies  Allergen Reactions  . Percocet [Oxycodone-Acetaminophen]     Nausea-Doesn't work     Outpatient Prescriptions Prior to Visit  Medication Sig Dispense Refill  . cholecalciferol (VITAMIN D) 1000 UNITS tablet Take 2,000 Units by mouth daily.      . clobetasol ointment (TEMOVATE) 0.05 % Apply topically 2 (two) times daily. Apply bid to itchy rash, do not use on face, genitals, axillae  60 g  1  . dexlansoprazole (DEXILANT) 60 MG capsule Take 60 mg by mouth daily.      [name] Kitchen etodolac (LODINE) 400 MG tablet Take 400 mg by mouth 2 (two) times daily.      . fluticasone (FLONASE) 50 MCG/ACT nasal spray Place 2 sprays into the nose daily.  16 g  12  . lisinopril (PRINIVIL,ZESTRIL) 5 MG tablet TAKE 1 TABLET BY MOUTH ONCE A DAY  30 tablet  6  . Naphazoline-Pheniramine (OPCON-A) 0.027-0.315 % SOLN Apply 1 drop to eye 2 (two) times daily as needed (dry/irritated eyes).      . naproxen sodium (ANAPROX) 220 MG tablet Take 440 mg by mouth 2 (two) times daily as needed (Pain).      . sildenafil (REVATIO) 20 MG tablet Take 1 tablet daily as needed for Erectile Dysfunction.  20 tablet  3  . tadalafil (CIALIS) 20 MG tablet Take 1 tablet (20 mg total) by mouth daily as needed for erectile dysfunction.  20 tablet  3  . trolamine salicylate (ASPERCREME) 10 % cream Apply 1 application topically as needed for muscle pain.      [name] Kitchen HYDROmorphone (DILAUDID) 2 MG tablet Take 1 tablet (2 mg total) by mouth  every 4 (four) hours as needed for severe pain.  30 tablet  0  . Liniments (SALONPAS ARTHRITIS PAIN RELIEF) PADS Apply 1 each topically daily as needed (Pain).       No facility-administered medications prior to visit.         Objective:   Physical Exam Filed Vitals:   02/23/14 1415  BP: 138/86  Pulse: 101  Height: 5\' 11"  (1.803 m)  Weight: 212 lb (96.163 kg)  SpO2: 97%   Gen: Pleasant, well-nourished, in no distress,  normal affect  ENT: No lesions,  mouth clear,  oropharynx clear, no postnasal drip  Neck: No JVD, no TMG, no carotid bruits  Lungs: No use of accessory muscles, no dullness to percussion, clear without rales or rhonchi  Cardiovascular: RRR, heart sounds normal, no murmur or gallops, no peripheral edema  Musculoskeletal: No deformities, no cyanosis or clubbing  Neuro: alert, non focal  Skin: Warm, no lesions or rashes   CT chest 01/06/14 --  COMPARISON: Chest CT 07/04/2013  FINDINGS:  Subcentimeter bilateral axillary lymph nodes are similar to the  prior study. No enlarged mediastinal or hilar lymph nodes are  identified. The heart is unremarkable in appearance. No pleural or  pericardial effusion is present.  There is a new, 4 mm ground-glass nodule in the basilar right lower  lobe (image 49). Other small bilateral pulmonary nodules are  unchanged in size as follows: 3 mm apical right upper lobe (image  10), 4 mm posterior right upper lobe (image 16), 5 mm anterior right  upper lobe (image 28), 3 mm along the right minor fissure (image  34), 3 mm right middle lobe (image 37), 4 mm left upper lobe (image  23), 4 mm left upper lobe (image 30), and 2 mm along the inferior  aspect of the left major fissure (image 42). Mild subpleural  fibrosis is noted in the right lower lobe adjacent to vertebral  osteophytes.  2.3 cm hypodensity is partially visualized in the upper pole the  right kidney, most likely a cyst but incompletely evaluated.  Multiple small  Schmorl's nodes are noted in the thoracic spine.  IMPRESSION:  New 4 mm ground-glass right lower lobe nodule, positively  inflammatory or secondary to atelectasis, with unchanged appearance  of other small bilateral pulmonary nodules measuring up to 5 mm in  size. Additional follow-up chest CT is recommended in 6 months to  document 1 year of stability.     Assessment & Plan:  Tobacco use disorder, moderate, in sustained remission - will perform screening PFT  - no meds planned at this time  Pulmonary nodules Plan for repeat CT scan in 6 months. His known nodules are stable, he has an area of GGI that is subtle and appears to be new. Suspect that this will clear, but it merits follow up as well as the 4-60mm nodules. I will see him after the CT scan to review.

## 2014-02-23 NOTE — Patient Instructions (Signed)
We will repeat you CT scan of the chest in October 2015 We will perform full pulmonary function testing Follow with Dr [name] in October after your CT scan to review the results.

## 2014-02-23 NOTE — Assessment & Plan Note (Addendum)
Plan for repeat CT scan in 6 months. His known nodules are stable, he has an area of GGI that is subtle and appears to be new. Suspect that this will clear, but it merits follow up as well as the 4-93mm nodules. I will see him after the CT scan to review.

## 2014-02-23 NOTE — Assessment & Plan Note (Signed)
-   will perform screening PFT  - no meds planned at this time

## 2014-03-02 ENCOUNTER — Encounter (HOSPITAL_COMMUNITY): Payer: Self-pay

## 2014-03-06 ENCOUNTER — Other Ambulatory Visit: Payer: Self-pay | Admitting: Internal Medicine

## 2014-03-16 ENCOUNTER — Encounter (HOSPITAL_COMMUNITY): Payer: Self-pay

## 2014-03-23 ENCOUNTER — Ambulatory Visit (HOSPITAL_COMMUNITY)
Admission: RE | Admit: 2014-03-23 | Discharge: 2014-03-23 | Disposition: A | Discharge status: Home / Self Care | Payer: Managed Care, Other (non HMO) | Source: Ambulatory Visit | Attending: Emergency Medicine | Admitting: Emergency Medicine

## 2014-03-23 DIAGNOSIS — J988 Other specified respiratory disorders: Secondary | ICD-10-CM | POA: Insufficient documentation

## 2014-03-23 DIAGNOSIS — Z87891 Personal history of nicotine dependence: Secondary | ICD-10-CM | POA: Insufficient documentation

## 2014-03-23 DIAGNOSIS — R0989 Other specified symptoms and signs involving the circulatory and respiratory systems: Principal | ICD-10-CM | POA: Insufficient documentation

## 2014-03-23 DIAGNOSIS — F17201 Nicotine dependence, unspecified, in remission: Secondary | ICD-10-CM

## 2014-03-23 DIAGNOSIS — R0609 Other forms of dyspnea: Secondary | ICD-10-CM | POA: Diagnosis present

## 2014-03-23 MED ORDER — ALBUTEROL SULFATE (2.5 MG/3ML) 0.083% IN NEBU
2.5000 mg | INHALATION_SOLUTION | Freq: Once | RESPIRATORY_TRACT | Status: AC
  Administered 2014-03-23: 2.5 mg via RESPIRATORY_TRACT

## 2014-03-25 LAB — PULMONARY FUNCTION TEST
DL/VA % pred: 86 %
DL/VA: 4.05 ml/min/mmHg/L
DLCO UNC % PRED: 77 %
DLCO unc: 26 ml/min/mmHg
FEF 25-75 PRE: 1.92 L/s
FEF 25-75 Post: 1.89 L/sec
FEF2575-%Change-Post: -1 %
FEF2575-%Pred-Post: 65 %
FEF2575-%Pred-Pre: 66 %
FEV1-%Change-Post: 0 %
FEV1-%PRED-PRE: 94 %
FEV1-%Pred-Post: 94 %
FEV1-PRE: 3.02 L
FEV1-Post: 3 L
FEV1FVC-%Change-Post: 0 %
FEV1FVC-%PRED-PRE: 93 %
FEV6-%CHANGE-POST: 0 %
FEV6-%PRED-POST: 103 %
FEV6-%Pred-Pre: 103 %
FEV6-PRE: 4.09 L
FEV6-Post: 4.1 L
FEV6FVC-%CHANGE-POST: 0 %
FEV6FVC-%PRED-PRE: 103 %
FEV6FVC-%Pred-Post: 103 %
FVC-%Change-Post: 0 %
FVC-%PRED-PRE: 100 %
FVC-%Pred-Post: 99 %
FVC-POST: 4.12 L
FVC-Pre: 4.13 L
POST FEV6/FVC RATIO: 99 %
Post FEV1/FVC ratio: 73 %
Pre FEV1/FVC ratio: 73 %
Pre FEV6/FVC Ratio: 99 %
RV % pred: 53 %
RV: 1.25 L
TLC % PRED: 78 %
TLC: 5.63 L

## 2014-05-05 ENCOUNTER — Other Ambulatory Visit: Payer: Self-pay | Admitting: Internal Medicine

## 2014-06-01 ENCOUNTER — Encounter: Payer: Self-pay | Admitting: Family Medicine

## 2014-06-01 ENCOUNTER — Ambulatory Visit (INDEPENDENT_AMBULATORY_CARE_PROVIDER_SITE_OTHER): Payer: Managed Care, Other (non HMO) | Admitting: Family Medicine

## 2014-06-01 VITALS — BP 130/90 | HR 93 | Temp 98.0°F | Resp 16 | Ht 69.5 in | Wt 201.4 lb

## 2014-06-01 DIAGNOSIS — J3489 Other specified disorders of nose and nasal sinuses: Secondary | ICD-10-CM

## 2014-06-01 DIAGNOSIS — J029 Acute pharyngitis, unspecified: Secondary | ICD-10-CM

## 2014-06-01 DIAGNOSIS — R42 Dizziness and giddiness: Secondary | ICD-10-CM

## 2014-06-01 LAB — POCT CBC
GRANULOCYTE PERCENT: 69.9 % (ref 37–80)
HEMATOCRIT: 50.3 % (ref 43.5–53.7)
HEMOGLOBIN: 16.5 g/dL (ref 14.1–18.1)
Lymph, poc: 1.7 (ref 0.6–3.4)
MCH, POC: 29.6 pg (ref 27–31.2)
MCHC: 32.8 g/dL (ref 31.8–35.4)
MCV: 90.3 fL (ref 80–97)
MID (cbc): 0.2 (ref 0–0.9)
MPV: 9.4 fL (ref 0–99.8)
POC GRANULOCYTE: 4.4 (ref 2–6.9)
POC LYMPH PERCENT: 27.4 %L (ref 10–50)
POC MID %: 2.7 %M (ref 0–12)
Platelet Count, POC: 220 10*3/uL (ref 142–424)
RBC: 5.57 M/uL (ref 4.69–6.13)
RDW, POC: 13.5 %
WBC: 6.3 10*3/uL (ref 4.6–10.2)

## 2014-06-01 LAB — COMPREHENSIVE METABOLIC PANEL
ALT: 27 U/L (ref 0–53)
AST: 16 U/L (ref 0–37)
Albumin: 4.6 g/dL (ref 3.5–5.2)
Alkaline Phosphatase: 85 U/L (ref 39–117)
BUN: 10 mg/dL (ref 6–23)
CALCIUM: 9.5 mg/dL (ref 8.4–10.5)
CHLORIDE: 103 meq/L (ref 96–112)
CO2: 26 mEq/L (ref 19–32)
Creat: 0.81 mg/dL (ref 0.50–1.35)
Glucose, Bld: 81 mg/dL (ref 70–99)
POTASSIUM: 4.3 meq/L (ref 3.5–5.3)
SODIUM: 137 meq/L (ref 135–145)
TOTAL PROTEIN: 6.8 g/dL (ref 6.0–8.3)
Total Bilirubin: 0.8 mg/dL (ref 0.2–1.2)

## 2014-06-01 LAB — TSH: TSH: 1.201 u[IU]/mL (ref 0.350–4.500)

## 2014-06-01 LAB — POCT RAPID STREP A (OFFICE): RAPID STREP A SCREEN: NEGATIVE

## 2014-06-01 LAB — GLUCOSE, POCT (MANUAL RESULT ENTRY): POC GLUCOSE: 95 mg/dL (ref 70–99)

## 2014-06-01 MED ORDER — AMOXICILLIN 500 MG PO CAPS: 1000.0000 mg | ORAL_CAPSULE | Freq: Two times a day (BID) | ORAL | Status: DC

## 2014-06-01 NOTE — Patient Instructions (Signed)
We will be in touch with the rest of your labs.  Let's try treating you for a sinus infection with amoxicillin.  Let me know if you are not feeling better soon!

## 2014-06-01 NOTE — Progress Notes (Signed)
[facility] [address] [place] [address] [phone]  Date:  06/01/2014   Name:  [name]   DOB:  06-28-1951   MRN:  [id number]  PCP:  Pedro Portela, MD    Chief Complaint: Establish Care   History of Present Illness:  [name] is a 63 y.o. very pleasant male patient who presents with the following:  Here today to follow-up/ establish care and also to discuss possible current illness. He notes nasal and facial congestion, and pain in his palate.  This has gone on for about 3 days and seems to be getting worse.  He feels a "burning sensation" in the roof of his mouth.    He does not have a cough, no fever.  No GI symptoms.  He also states he has noted "dizzy spells, feeling off balance" for about  2.5 weeks.  It may last "a couple of hours," and he may notice it every few days.   He has not noted any headaches except for pressure in his sinuses.  He does need to get an eye exam; he has noted "blurry vision" mostly when he gets dizzy. He is lightheaded- not vertigo.   He had some tinnitus a few days ago- now resolved.    His pulmonologist is Dr. Lamonte Torres.  He will see him again in about one month.  He quit smoking about 15 years ago, has not drunk alcohol in even longer.    Patient Active Problem List   Diagnosis Date Noted  . Pulmonary nodules 02/23/2014  . Tobacco use disorder, moderate, in sustained remission 02/23/2014  . Recurrent right inguinal hernia 10/20/2013  . ED (erectile dysfunction) 08/26/2012  . Physical exam, annual 11/05/2011  . HTN (hypertension) 11/05/2011  . GERD (gastroesophageal reflux disease) 11/05/2011  . Chronic joint pain 11/05/2011    Past Medical History  Diagnosis Date  . Allergy     seasonal  . GERD (gastroesophageal reflux disease)   . Hypertension   . Arthritis     oa    Past Surgical History  Procedure Laterality Date  . Knee surgery Left july 2014    mcl and meniscus repair  . Hernia  repair  last done 3-4 yrs ago    x 2  . Inguinal hernia repair Right 11/20/2013    Procedure: OPEN REPAIR OF RECURRENT RIGHT INGUINAL HERNIA WITH MESH;  Surgeon: [name]. [name], MD;  Location: [facility];  Service: General;  Laterality: Right;  . Insertion of mesh Right 11/20/2013    Procedure: INSERTION OF MESH;  Surgeon: [name]. [name], MD;  Location: [facility];  Service: General;  Laterality: Right;    History  Substance Use Topics  . Smoking status: Former Smoker -- 1.50 packs/day for 20 years    Types: Cigarettes    Quit date: 11/05/1999  . Smokeless tobacco: Never Used  . Alcohol Use: No    Family History  Problem Relation Age of Onset  . Cancer Mother   . Cancer Father   . Cancer Sister   . Arthritis Sister     Allergies  Allergen Reactions  . Percocet [Oxycodone-Acetaminophen]     Nausea-Doesn't work    Medication list has been reviewed and updated.  Current Outpatient Prescriptions on File Prior to Visit  Medication Sig Dispense Refill  . cholecalciferol (VITAMIN D) 1000 UNITS tablet Take 2,000 Units by mouth daily.      . clobetasol ointment (TEMOVATE) 0.05 % Apply topically  2 (two) times daily. Apply bid to itchy rash, do not use on face, genitals, axillae  60 g  1  . DEXILANT 60 MG capsule TAKE 1 CAPSULE BY MOUTH ONCE A DAY  90 capsule  3  . etodolac (LODINE) 400 MG tablet Take 400 mg by mouth 2 (two) times daily.      . fluticasone (FLONASE) 50 MCG/ACT nasal spray Place 2 sprays into both nostrils daily. PATIENT NEEDS OFFICE VISIT FOR ADDITIONAL REFILLS  16 g  1  . HYDROmorphone (DILAUDID) 2 MG tablet Take 1 tablet (2 mg total) by mouth every 4 (four) hours as needed for severe pain.  30 tablet  0  . Liniments (SALONPAS ARTHRITIS PAIN RELIEF) PADS Apply 1 each topically daily as needed (Pain).      [name] Kitchen lisinopril (PRINIVIL,ZESTRIL) 5 MG tablet TAKE 1 TABLET BY MOUTH ONCE A DAY  30 tablet  6  . Naphazoline-Pheniramine (OPCON-A) 0.027-0.315 % SOLN Apply 1 drop to eye 2  (two) times daily as needed (dry/irritated eyes).      . naproxen sodium (ANAPROX) 220 MG tablet Take 440 mg by mouth 2 (two) times daily as needed (Pain).      . sildenafil (REVATIO) 20 MG tablet Take 1 tablet daily as needed for Erectile Dysfunction.  20 tablet  3  . tadalafil (CIALIS) 20 MG tablet Take 1 tablet (20 mg total) by mouth daily as needed for erectile dysfunction.  20 tablet  3  . trolamine salicylate (ASPERCREME) 10 % cream Apply 1 application topically as needed for muscle pain.       No current facility-administered medications on file prior to visit.    Review of Systems:  As per HPI- otherwise negative.   Physical Examination: Filed Vitals:   06/01/14 1200  BP: 130/90  Pulse: 93  Temp: 98 F (36.7 C)  Resp: 16   Filed Vitals:   06/01/14 1200  Height: 5' 9.5" (1.765 m)  Weight: 201 lb 6.4 oz (91.354 kg)   Body mass index is 29.33 kg/(m^2). Ideal Body Weight: Weight in (lb) to have BMI = 25: 171.4  GEN: WDWN, NAD, Non-toxic, A & O x 3, overweight, looks well HEENT: Atraumatic, Normocephalic. Neck supple. No masses, No LAD.  Bilateral TM wnl, oropharynx normal.  PEERL,EOMI.   Ears and Nose: No external deformity. CV: RRR, No M/G/R. No JVD. No thrill. No extra heart sounds. PULM: CTA B, no wheezes, crackles, rhonchi. No retractions. No resp. distress. No accessory muscle use. ABD: S, NT, ND, +BS. No rebound. No HSM. EXTR: No c/c/e NEURO Normal gait.  PSYCH: Normally interactive. Conversant. Not depressed or anxious appearing.  Calm demeanor.   EKG: NSR, no ST elevation or depression  Results for orders placed in visit on 06/01/14  POCT CBC      Result Value Ref Range   WBC 6.3  4.6 - 10.2 K/uL   Lymph, poc 1.7  0.6 - 3.4   POC LYMPH PERCENT 27.4  10 - 50 %L   MID (cbc) 0.2  0 - 0.9   POC MID % 2.7  0 - 12 %M   POC Granulocyte 4.4  2 - 6.9   Granulocyte percent 69.9  37 - 80 %G   RBC 5.57  4.69 - 6.13 M/uL   Hemoglobin 16.5  14.1 - 18.1 g/dL   HCT,  POC 50.3  43.5 - 53.7 %   MCV 90.3  80 - 97 fL   MCH, POC 29.6  27 -  31.2 pg   MCHC 32.8  31.8 - 35.4 g/dL   RDW, POC 13.5     Platelet Count, POC 220  142 - 424 K/uL   MPV 9.4  0 - 99.8 fL  GLUCOSE, POCT (MANUAL RESULT ENTRY)      Result Value Ref Range   POC Glucose 95  70 - 99 mg/dl  POCT RAPID STREP A (OFFICE)      Result Value Ref Range   Rapid Strep A Screen Negative  Negative    Assessment and Plan: Lightheadedness - Plan: POCT CBC, POCT glucose (manual entry), Comprehensive metabolic panel, TSH, EKG [id number]  Acute pharyngitis, unspecified pharyngitis type - Plan: POCT rapid strep A, amoxicillin (AMOXIL) 500 MG capsule  Sinus pain - Plan: amoxicillin (AMOXIL) 500 MG capsule  Non- specific feeling of lightheadedness, now with sinus pain and pressure.  Will treat with amoxicillin for possible sinus infection. Await other labs and will follow-up with him    Signed Lamar Blinks, MD

## 2014-06-02 ENCOUNTER — Encounter: Payer: Self-pay | Admitting: Family Medicine

## 2014-06-02 NOTE — Telephone Encounter (Signed)
ERROR

## 2014-06-22 ENCOUNTER — Other Ambulatory Visit: Payer: Self-pay | Admitting: Internal Medicine

## 2014-06-23 NOTE — Telephone Encounter (Signed)
Ok to refill 6 months 

## 2014-07-13 ENCOUNTER — Ambulatory Visit (INDEPENDENT_AMBULATORY_CARE_PROVIDER_SITE_OTHER)
Admission: RE | Admit: 2014-07-13 | Discharge: 2014-07-13 | Disposition: A | Discharge status: Home / Self Care | Payer: Managed Care, Other (non HMO) | Source: Ambulatory Visit | Attending: Emergency Medicine | Admitting: Emergency Medicine

## 2014-07-13 DIAGNOSIS — R918 Other nonspecific abnormal finding of lung field: Secondary | ICD-10-CM

## 2014-07-20 ENCOUNTER — Encounter: Payer: Self-pay | Admitting: Emergency Medicine

## 2014-07-20 ENCOUNTER — Ambulatory Visit (INDEPENDENT_AMBULATORY_CARE_PROVIDER_SITE_OTHER): Payer: Managed Care, Other (non HMO) | Admitting: Emergency Medicine

## 2014-07-20 ENCOUNTER — Encounter (INDEPENDENT_AMBULATORY_CARE_PROVIDER_SITE_OTHER): Payer: Self-pay

## 2014-07-20 VITALS — BP 150/105 | HR 93 | Temp 98.4°F | Ht 71.0 in | Wt 219.2 lb

## 2014-07-20 DIAGNOSIS — J449 Chronic obstructive pulmonary disease, unspecified: Secondary | ICD-10-CM

## 2014-07-20 DIAGNOSIS — R918 Other nonspecific abnormal finding of lung field: Secondary | ICD-10-CM

## 2014-07-20 MED ORDER — ALBUTEROL SULFATE HFA 108 (90 BASE) MCG/ACT IN AERS: 2.0000 | INHALATION_SPRAY | RESPIRATORY_TRACT | Status: DC | PRN

## 2014-07-20 NOTE — Assessment & Plan Note (Signed)
Based on spirometry. We will try starting SABA when necessary to see if he benefits. Follow in 2 months to see if medication is helped him. At some point we may discuss starting a scheduled bronchodilator.

## 2014-07-20 NOTE — Assessment & Plan Note (Signed)
Stable nodules over 1 year on follow-up CT scan 07/13/14. His groundglass nodular opacity first benefit had April 2015 seen at the right base is also stable. He needs a repeat CT scan of the chest in October 2016.

## 2014-07-20 NOTE — Patient Instructions (Signed)
We will start albuterol 2 puffs up to every 4 hours if needed for shortness of breath We will repeat your CT scan of the chest in October 2016 Follow in 2 months

## 2014-07-20 NOTE — Progress Notes (Signed)
Subjective:    Patient ID: [name], male    DOB: April 22, 1951, 63 y.o.   MRN: [id number]  HPI 63 yo former smoker (30 pk-yrs), HTN, GERD, allergies, OA. Referred today for pulmonary nodules on CT chest.  He underwent CXR in 06/2013 that showed possible nodule, prompted CT chest that confirmed scattered sub-83mm nodules. Repeat CT 4/15 showed that the nodules are stable, also a new small area of GGI. He has some occasional wheeze, is able to stay active. He occasionally gets choked on her own secretions. He is on ACE-i.   ROV 07/20/14 -- f/u visit for hx tobacco, pulmonary nodules that have been scanned first in 10/'14, then stable 01/05/14. There was a small GGI to follow as well. He underwent PFTs in July 2015 that showed mild obstructive lung disease. CT scan from 07/13/14 is stable, both the nodules and the R basilar GG nodule. Next scan should be in 1 yr.    Review of Systems  Constitutional: Negative for fever and unexpected weight change.  HENT: Positive for sinus pressure and trouble swallowing. Negative for congestion, dental problem, ear pain, nosebleeds, postnasal drip, rhinorrhea, sneezing and sore throat.   Eyes: Negative for redness and itching.  Respiratory: Positive for shortness of breath and wheezing. Negative for cough and chest tightness.   Cardiovascular: Positive for leg swelling. Negative for palpitations.       Knees   Gastrointestinal: Negative for nausea and vomiting.  Genitourinary: Negative for dysuria.  Musculoskeletal: Negative for joint swelling.  Skin: Negative for rash.  Neurological: Negative for headaches.  Hematological: Does not bruise/bleed easily.  Psychiatric/Behavioral: Negative for dysphoric mood. The patient is not nervous/anxious.         Objective:   Physical Exam Filed Vitals:   07/20/14 1333  BP: 150/105  Pulse: 93  Temp: 98.4 F (36.9 C)  TempSrc: Oral  Height: 5\' 11"  (1.803 m)  Weight: 219 lb 3.2 oz (99.428 kg)  SpO2: 95%    Gen: Pleasant, well-nourished, in no distress,  normal affect  ENT: No lesions,  mouth clear,  oropharynx clear, no postnasal drip  Neck: No JVD, no TMG, no carotid bruits  Lungs: No use of accessory muscles, no dullness to percussion, clear without rales or rhonchi  Cardiovascular: RRR, heart sounds normal, no murmur or gallops, no peripheral edema  Musculoskeletal: No deformities, no cyanosis or clubbing  Neuro: alert, non focal  Skin: Warm, no lesions or rashes  07/13/14 --  COMPARISON: 01/05/2014  FINDINGS: Mediastinum: Normal heart size. No pericardial effusion. The trachea appears patent and is midline. Normal appearance of the esophagus. No mediastinal or hilar adenopathy identified.  Lungs/Pleura: There is no pleural effusion identified. Index nodule in the right upper lobe measures 4 mm and is unchanged from previous exam. Subpleural nodule within the anterior right upper lobe is unchanged measuring 4 mm, image 24/series 3. Subpleural right middle lobe nodule is unchanged measuring 3 mm, image 33/series 3. No new or enlarging pulmonary nodules or mass is identified. Sub solid lesion within the right base measures 9 mm, image 105 of series 602. This is stable when compared with 01/05/2014 but is new from 07/04/2013.  Upper Abdomen: The visualized portions of the liver, gallbladder, and pancreas are unremarkable. The spleen is negative.  Musculoskeletal: Review of the visualized bony structures is on unremarkable. No aggressive lytic or sclerotic bone lesions.  IMPRESSION: 1. Stable small solid pulmonary nodules. Compatible with benign lesions. 2. There is a 9 mm sub solid  lesion in the right base which is unchanged from 01/05/2014 but new from 07/04/2013. This will need annual surveillance for minimum of 36 months to document of benignity. The next followup examination should be obtained on 07/14/2015. A PET-CT would be of limited value in evaluating  this lesion. This recommendation follows the consensus statement: Recommendations for the Management of Subsolid Pulmonary Nodules Detected at CT: A Statement from the [place] as published in Radiology 2013; 266:304-317.     Assessment & Plan:  Pulmonary nodules Stable nodules over 1 year on follow-up CT scan 07/13/14. His groundglass nodular opacity first benefit had April 2015 seen at the right base is also stable. He needs a repeat CT scan of the chest in October 2016.  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 We will try starting SABA when necessary to see if he benefits. Follow in 2 months to see if medication is helped him. At some point we may discuss starting a scheduled bronchodilator.

## 2014-07-31 ENCOUNTER — Telehealth: Payer: Self-pay

## 2014-07-31 MED ORDER — LISINOPRIL 5 MG PO TABS: ORAL_TABLET | ORAL | Status: DC

## 2014-07-31 NOTE — Telephone Encounter (Signed)
Pt states that [facility] # [place], [place] sent over a request for a refill on his lisinopril (PRINIVIL,ZESTRIL) 5 MG tablet [[id number]] , three days ago and has not heard back from [place].  I did not see anything in the system, please advise pt.

## 2014-07-31 NOTE — Telephone Encounter (Signed)
Refill sent for 3 months. Pt will need to follow up before needs additional refills. LM to advise at pharmacy.

## 2014-09-01 ENCOUNTER — Other Ambulatory Visit: Payer: Self-pay | Admitting: Internal Medicine

## 2014-09-09 ENCOUNTER — Other Ambulatory Visit: Payer: Self-pay | Admitting: Physician Assistant

## 2014-09-15 ENCOUNTER — Telehealth: Payer: Self-pay

## 2014-09-15 NOTE — Telephone Encounter (Signed)
Patient has a request in for DEXILANT 60 MG capsule [facility] stated we denied his refills.  He is down to one pill.   Please call patient and update him on status of his medication.  [phone]

## 2014-09-16 MED ORDER — DEXLANSOPRAZOLE 60 MG PO CPDR: 1.0000 | DELAYED_RELEASE_CAPSULE | Freq: Every day | ORAL | Status: DC

## 2014-09-16 NOTE — Telephone Encounter (Signed)
Pt.notified

## 2014-09-16 NOTE — Telephone Encounter (Signed)
Rx sent.  Please advise patient to RTC for re-evaluation before this runs out.

## 2014-09-21 ENCOUNTER — Ambulatory Visit (INDEPENDENT_AMBULATORY_CARE_PROVIDER_SITE_OTHER): Payer: Managed Care, Other (non HMO) | Admitting: Emergency Medicine

## 2014-09-21 ENCOUNTER — Encounter: Payer: Self-pay | Admitting: Emergency Medicine

## 2014-09-21 VITALS — BP 122/82 | HR 94 | Temp 98.2°F | Ht 71.0 in | Wt 215.0 lb

## 2014-09-21 DIAGNOSIS — J449 Chronic obstructive pulmonary disease, unspecified: Secondary | ICD-10-CM

## 2014-09-21 DIAGNOSIS — R918 Other nonspecific abnormal finding of lung field: Secondary | ICD-10-CM

## 2014-09-21 NOTE — Assessment & Plan Note (Signed)
We will repeat a CT scan of the chest without contrast in October 2016

## 2014-09-21 NOTE — Assessment & Plan Note (Signed)
May have had some benefit from albuterol, but do not believe that we need to start LABA or LAMA right now. We will continue to follow.

## 2014-09-21 NOTE — Patient Instructions (Signed)
We will repeat your CT scan of the chest in October 2016 Please continue to use your albuterol 2 puffs if needed for shortness of breath We will not start a scheduled long-acting inhaler right now.  Follow with Dr [name] in October after your scan to review. Call sooner if breathing changes.

## 2014-09-21 NOTE — Progress Notes (Signed)
Subjective:    Patient ID: [name], male    DOB: [birth year], 64 y.o.   MRN: [id number]  HPI 64 yo former smoker (30 pk-yrs), HTN, GERD, allergies, OA. Referred today for pulmonary nodules on CT chest.  He underwent CXR in 06/2013 that showed possible nodule, prompted CT chest that confirmed scattered sub-74mm nodules. Repeat CT 4/15 showed that the nodules are stable, also a new small area of GGI. He has some occasional wheeze, is able to stay active. He occasionally gets choked on her own secretions. He is on ACE-i.   ROV 07/20/14 -- f/u visit for hx tobacco, pulmonary nodules that have been scanned first in 10/'14, then stable 01/05/14. There was a small GGI to follow as well. He underwent PFTs in July 2015 that showed mild obstructive lung disease. CT scan from 07/13/14 is stable, both the nodules and the R basilar GG nodule. Next scan should be in 1 yr.   ROV 09/21/14 -- follow up for obstructive lung disease and pulmonary nodules that are been followed by serial CT scans. The last scan was on 07/13/14 and was stable.    Review of Systems  Constitutional: Negative for fever and unexpected weight change.  HENT: Positive for sinus pressure and trouble swallowing. Negative for congestion, dental problem, ear pain, nosebleeds, postnasal drip, rhinorrhea, sneezing and sore throat.   Eyes: Negative for redness and itching.  Respiratory: Positive for shortness of breath and wheezing. Negative for cough and chest tightness.   Cardiovascular: Positive for leg swelling. Negative for palpitations.       Knees   Gastrointestinal: Negative for nausea and vomiting.  Genitourinary: Negative for dysuria.  Musculoskeletal: Negative for joint swelling.  Skin: Negative for rash.  Neurological: Negative for headaches.  Hematological: Does not bruise/bleed easily.  Psychiatric/Behavioral: Negative for dysphoric mood. The patient is not nervous/anxious.         Objective:   Physical Exam Filed  Vitals:   09/21/14 1325  BP: 122/82  Pulse: 94  Temp: 98.2 F (36.8 C)  TempSrc: Oral  Height: 5\' 11"  (1.803 m)  Weight: 215 lb (97.523 kg)  SpO2: 97%   Gen: Pleasant, well-nourished, in no distress,  normal affect  ENT: No lesions,  mouth clear,  oropharynx clear, no postnasal drip  Neck: No JVD, no TMG, no carotid bruits  Lungs: No use of accessory muscles, clear without rales or rhonchi  Cardiovascular: RRR, heart sounds normal, no murmur or gallops, no peripheral edema  Musculoskeletal: No deformities, no cyanosis or clubbing  Neuro: alert, non focal  Skin: Warm, no lesions or rashes  07/13/14 --  COMPARISON: 01/05/2014  FINDINGS: Mediastinum: Normal heart size. No pericardial effusion. The trachea appears patent and is midline. Normal appearance of the esophagus. No mediastinal or hilar adenopathy identified.  Lungs/Pleura: There is no pleural effusion identified. Index nodule in the right upper lobe measures 4 mm and is unchanged from previous exam. Subpleural nodule within the anterior right upper lobe is unchanged measuring 4 mm, image 24/series 3. Subpleural right middle lobe nodule is unchanged measuring 3 mm, image 33/series 3. No new or enlarging pulmonary nodules or mass is identified. Sub solid lesion within the right base measures 9 mm, image 105 of series 602. This is stable when compared with 01/05/2014 but is new from 07/04/2013.  Upper Abdomen: The visualized portions of the liver, gallbladder, and pancreas are unremarkable. The spleen is negative.  Musculoskeletal: Review of the visualized bony structures is on unremarkable. No aggressive  lytic or sclerotic bone lesions.  IMPRESSION: 1. Stable small solid pulmonary nodules. Compatible with benign lesions. 2. There is a 9 mm sub solid lesion in the right base which is unchanged from 01/05/2014 but new from 07/04/2013. This will need annual surveillance for minimum of 36 months to  document of benignity. The next followup examination should be obtained on 07/14/2015. A PET-CT would be of limited value in evaluating this lesion. This recommendation follows the consensus statement: Recommendations for the Management of Subsolid Pulmonary Nodules Detected at CT: A Statement from the [facility] as published in Radiology 2013; 266:304-317.     Assessment & Plan:  COPD, mild May have had some benefit from albuterol, but do not believe that we need to start LABA or LAMA right now. We will continue to follow.   Pulmonary nodules We will repeat a CT scan of the chest without contrast in October 2016

## 2014-10-07 ENCOUNTER — Telehealth: Payer: Self-pay | Admitting: Family Medicine

## 2014-10-07 NOTE — Telephone Encounter (Signed)
Received a note from Dr. Denna Haggard- he thinks that the rash on his feet may be a lichenoid drug ereruption- lisinopril is a possible cause.  He has been asked to follow-up with [place] and possibly change his medication to a non- ace for at least 10 weeks to see if this helps.  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  Spoke with Dr. Denna Haggard- he reports that it would be better to avoid a ARB as well if we can, although evidence is stronger for ACE being the potential problem  LMOM for pt asking him to come in or call so we can discuss this and potentially change him to something else for a 10 week trial

## 2014-10-08 ENCOUNTER — Telehealth: Payer: Self-pay

## 2014-10-08 DIAGNOSIS — I10 Essential (primary) hypertension: Secondary | ICD-10-CM

## 2014-10-08 MED ORDER — AMLODIPINE BESYLATE 5 MG PO TABS: 5.0000 mg | ORAL_TABLET | Freq: Every day | ORAL | Status: DC

## 2014-10-08 NOTE — Telephone Encounter (Signed)
Called pt yesterday and LMOM as below:  Received a note from Dr. Denna Haggard- he thinks that the rash on his feet may be a lichenoid drug ereruption- lisinopril is a possible cause. He has been asked to follow-up with [place] and possibly change his medication to a non- ace for at least 10 weeks to see if this helps. 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 Spoke with Dr. Denna Haggard- he reports that it would be better to avoid a ARB as well if we can, although evidence is stronger for ACE being the potential problem  LMOM for pt asking him to come in or call so we can discuss this and potentially change him to something else for a 10 week trial   Pt called back today, he would like to try something new.  He is on just 5mg  of lisinopril  BP Readings from Last 3 Encounters:  09/21/14 122/82  07/20/14 150/105  06/01/14 130/90   Will rx 5mg  of norvasc for him to try for 10 weeks.  LMOM for him.  If after 10 weeks no change in his rash he can go back on lisinopril

## 2014-10-08 NOTE — Telephone Encounter (Signed)
Patient is returning Dr. Lorelei Pont ([place]) yesterday.   He is calling for a different Blood pressure medication  [facility] on [address]    [phone]  (Cell Phone )  Don't have cell phone on him - LMOVM

## 2014-11-10 ENCOUNTER — Encounter (HOSPITAL_BASED_OUTPATIENT_CLINIC_OR_DEPARTMENT_OTHER): Payer: Self-pay | Admitting: *Deleted

## 2014-11-10 ENCOUNTER — Emergency Department (HOSPITAL_BASED_OUTPATIENT_CLINIC_OR_DEPARTMENT_OTHER): Payer: Managed Care, Other (non HMO)

## 2014-11-10 ENCOUNTER — Emergency Department (HOSPITAL_BASED_OUTPATIENT_CLINIC_OR_DEPARTMENT_OTHER)
Admission: EM | Admit: 2014-11-10 | Discharge: 2014-11-10 | Disposition: A | Discharge status: Home / Self Care | Payer: Managed Care, Other (non HMO) | Attending: Emergency Medicine | Admitting: Emergency Medicine

## 2014-11-10 DIAGNOSIS — K219 Gastro-esophageal reflux disease without esophagitis: Secondary | ICD-10-CM | POA: Diagnosis not present

## 2014-11-10 DIAGNOSIS — Y9389 Activity, other specified: Secondary | ICD-10-CM | POA: Diagnosis not present

## 2014-11-10 DIAGNOSIS — S90222A Contusion of left lesser toe(s) with damage to nail, initial encounter: Secondary | ICD-10-CM | POA: Diagnosis not present

## 2014-11-10 DIAGNOSIS — Z880 Allergy status to penicillin: Secondary | ICD-10-CM | POA: Insufficient documentation

## 2014-11-10 DIAGNOSIS — Z87891 Personal history of nicotine dependence: Secondary | ICD-10-CM | POA: Diagnosis not present

## 2014-11-10 DIAGNOSIS — Y9289 Other specified places as the place of occurrence of the external cause: Secondary | ICD-10-CM | POA: Insufficient documentation

## 2014-11-10 DIAGNOSIS — Z79899 Other long term (current) drug therapy: Secondary | ICD-10-CM | POA: Diagnosis not present

## 2014-11-10 DIAGNOSIS — S90122A Contusion of left lesser toe(s) without damage to nail, initial encounter: Secondary | ICD-10-CM

## 2014-11-10 DIAGNOSIS — I10 Essential (primary) hypertension: Secondary | ICD-10-CM | POA: Diagnosis not present

## 2014-11-10 DIAGNOSIS — M1990 Unspecified osteoarthritis, unspecified site: Secondary | ICD-10-CM | POA: Insufficient documentation

## 2014-11-10 DIAGNOSIS — W228XXA Striking against or struck by other objects, initial encounter: Secondary | ICD-10-CM | POA: Insufficient documentation

## 2014-11-10 DIAGNOSIS — Y998 Other external cause status: Secondary | ICD-10-CM | POA: Diagnosis not present

## 2014-11-10 DIAGNOSIS — S99922A Unspecified injury of left foot, initial encounter: Secondary | ICD-10-CM | POA: Diagnosis present

## 2014-11-10 NOTE — ED Notes (Signed)
Pt c/o let 5th toe injury x 1 day ago

## 2014-11-10 NOTE — ED Provider Notes (Signed)
CSN: [id number]     Arrival date & time 11/10/14  1656 History   First MD Initiated Contact with Patient 11/10/14 1710     Chief Complaint  Patient presents with  . Toe Pain     (Consider location/radiation/quality/duration/timing/severity/associated sxs/prior Treatment) HPI Comments: 64 year old male presenting with left fifth toe pain 1 day. Patient reports he hit his left pinky toe on a desk last night, went to work today, however left because the pain was bad. Pain worse with pressure. No alleviating factors. Denies numbness or tingling. States he broke this same toe about 2 years ago.  Patient is a 64 y.o. male presenting with toe pain. The history is provided by the patient.  Toe Pain Pertinent negatives include no nausea or numbness.    Past Medical History  Diagnosis Date  . Allergy     seasonal  . GERD (gastroesophageal reflux disease)   . Hypertension   . Arthritis     oa   Past Surgical History  Procedure Laterality Date  . Knee surgery Left july 2014    mcl and meniscus repair  . Hernia repair  last done 3-4 yrs ago    x 2  . Inguinal hernia repair Right 11/20/2013    Procedure: OPEN REPAIR OF RECURRENT RIGHT INGUINAL HERNIA WITH MESH;  Surgeon: [name]. [name], MD;  Location: [facility];  Service: General;  Laterality: Right;  . Insertion of mesh Right 11/20/2013    Procedure: INSERTION OF MESH;  Surgeon: [name]. [name], MD;  Location: [facility];  Service: General;  Laterality: Right;   Family History  Problem Relation Age of Onset  . Cancer Mother   . Cancer Father   . Cancer Sister   . Arthritis Sister    History  Substance Use Topics  . Smoking status: Former Smoker -- 1.50 packs/day for 20 years    Types: Cigarettes    Quit date: 11/05/1999  . Smokeless tobacco: Never Used  . Alcohol Use: No    Review of Systems  HENT: Negative.   Respiratory: Negative.   Cardiovascular: Negative.   Gastrointestinal: Negative for nausea.  Musculoskeletal:       + L  pinky toe pain and swelling.  Skin: Positive for color change (bruising).  Neurological: Negative for numbness.      Allergies  Percocet and Amoxicillin  Home Medications   Prior to Admission medications   Medication Sig Start Date End Date Taking? Authorizing Provider  albuterol (PROVENTIL HFA;VENTOLIN HFA) 108 (90 BASE) MCG/ACT inhaler Inhale 2 puffs into the lungs every 4 (four) hours as needed for wheezing or shortness of breath. 07/20/14   Collene Gobble, MD  amLODipine (NORVASC) 5 MG tablet Take 1 tablet (5 mg total) by mouth daily. 10/08/14   Darreld Mclean, MD  cholecalciferol (VITAMIN D) 1000 UNITS tablet Take 2,000 Units by mouth daily.    Historical Provider, MD  CIALIS 20 MG tablet TAKE ONE TABLET BY MOUTH ONE TIME DAILY for erectile dysfunction 09/01/14   Orma Flaming, MD  clobetasol ointment (TEMOVATE) 0.05 % Apply topically 2 (two) times daily. Apply bid to itchy rash, do not use on face, genitals, axillae 02/24/13   Orma Flaming, MD  dexlansoprazole (DEXILANT) 60 MG capsule Take 1 capsule (60 mg total) by mouth daily. 09/16/14   Chelle Janalee Dane, PA-C  etodolac (LODINE) 400 MG tablet Take 400 mg by mouth 2 (two) times daily.    Historical Provider, MD  fluticasone (FLONASE) 50  MCG/ACT nasal spray Place 2 sprays into both nostrils daily. PATIENT NEEDS OFFICE VISIT FOR ADDITIONAL REFILLS 05/06/14   Orma Flaming, MD  HYDROmorphone (DILAUDID) 2 MG tablet Take 1 tablet (2 mg total) by mouth every 4 (four) hours as needed for severe pain. 11/20/13   Donnie Mesa, MD  [facility] ARTHRITIS PAIN RELIEF) PADS Apply 1 each topically daily as needed (Pain).    Historical Provider, MD  lisinopril (PRINIVIL,ZESTRIL) 5 MG tablet TAKE 1 TABLET BY MOUTH ONCE A DAY 07/31/14   Jaynee Eagles, PA-C  Naphazoline-Pheniramine (OPCON-A) 0.027-0.315 % SOLN Apply 1 drop to eye 2 (two) times daily as needed (dry/irritated eyes).    Historical Provider, MD  naproxen sodium (ANAPROX) 220 MG tablet  Take 440 mg by mouth 2 (two) times daily as needed (Pain).    Historical Provider, MD  sildenafil (REVATIO) 20 MG tablet TAKE 1 TABLET DAILY AS NEEDED FOR ERECTILE DYSFUNCTION. 06/24/14   Orma Flaming, MD  tadalafil (CIALIS) 20 MG tablet Take 1 tablet (20 mg total) by mouth daily as needed for erectile dysfunction. 11/12/13   Rise Mu, PA-C  trolamine salicylate (ASPERCREME) 10 % cream Apply 1 application topically as needed for muscle pain.    Historical Provider, MD   BP 128/83 mmHg  Pulse 88  Temp(Src) 98 F (36.7 C) (Oral)  Resp 16  Ht 5\' 11"  (1.803 m)  Wt 207 lb (93.895 kg)  BMI 28.88 kg/m2  SpO2 100% Physical Exam  Constitutional: He is oriented to person, place, and time. He appears well-developed and well-nourished. No distress.  HENT:  Head: Normocephalic and atraumatic.  Eyes: Conjunctivae and EOM are normal.  Neck: Normal range of motion. Neck supple.  Cardiovascular: Normal rate, regular rhythm and normal heart sounds.   Pulmonary/Chest: Effort normal and breath sounds normal.  Musculoskeletal:  Left pinky toe swollen and tender with mild bruising. Cap refill less than 3 seconds.  Neurological: He is alert and oriented to person, place, and time.  Skin: Skin is warm and dry.  Psychiatric: He has a normal mood and affect. His behavior is normal.  Nursing note and vitals reviewed.   ED Course  Procedures (including critical care time) Labs Review Labs Reviewed - No data to display  Imaging Review Dg Toe 5th Left  11/10/2014   CLINICAL DATA:  Left fifth toe injury last night, pain and swelling  EXAM: DG TOE 5TH LEFT  COMPARISON:  None.  FINDINGS: Three views of the left fifth toe submitted. No acute fracture or subluxation. No radiopaque foreign body.  IMPRESSION: Negative.   Electronically Signed   By: Lahoma Crocker M.D.   On: 11/10/2014 17:47     EKG Interpretation None      MDM   Final diagnoses:  Contusion of fifth toe, left, initial encounter    Neurovascularly intact. X-ray without any acute finding. Advised ice, elevation and NSAIDs. Stable for discharge. Return precautions given. Patient states understanding of treatment care plan and is agreeable.   Carman Ching, PA-C 11/10/14 1815  Tanna Furry, MD 11/11/14 [phone]

## 2014-11-10 NOTE — Discharge Instructions (Signed)
Contusion A contusion is a deep bruise. Contusions are the result of an injury that caused bleeding under the skin. The contusion may turn blue, purple, or yellow. Minor injuries will give you a painless contusion, but more severe contusions may stay painful and swollen for a few weeks.  CAUSES  A contusion is usually caused by a blow, trauma, or direct force to an area of the body. SYMPTOMS   Swelling and redness of the injured area.  Bruising of the injured area.  Tenderness and soreness of the injured area.  Pain. DIAGNOSIS  The diagnosis can be made by taking a history and physical exam. An X-ray, CT scan, or MRI may be needed to determine if there were any associated injuries, such as fractures. TREATMENT  Specific treatment will depend on what area of the body was injured. In general, the best treatment for a contusion is resting, icing, elevating, and applying cold compresses to the injured area. Over-the-counter medicines may also be recommended for pain control. Ask your caregiver what the best treatment is for your contusion. HOME CARE INSTRUCTIONS   Put ice on the injured area.  Put ice in a plastic bag.  Place a towel between your skin and the bag.  Leave the ice on for 15-20 minutes, 3-4 times a day, or as directed by your health care provider.  Only take over-the-counter or prescription medicines for pain, discomfort, or fever as directed by your caregiver. Your caregiver may recommend avoiding anti-inflammatory medicines (aspirin, ibuprofen, and naproxen) for 48 hours because these medicines may increase bruising.  Rest the injured area.  If possible, elevate the injured area to reduce swelling. SEEK IMMEDIATE MEDICAL CARE IF:   You have increased bruising or swelling.  You have pain that is getting worse.  Your swelling or pain is not relieved with medicines. MAKE SURE YOU:   Understand these instructions.  Will watch your condition.  Will get help right  away if you are not doing well or get worse. Document Released: 06/14/2005 Document Revised: 09/09/2013 Document Reviewed: 07/10/2011 [facility] Patient Information 2015 [place], [place]. This information is not intended to replace advice given to you by your health care provider. Make sure you discuss any questions you have with your health care provider.  RICE: Routine Care for Injuries The routine care of many injuries includes Rest, Ice, Compression, and Elevation (RICE). HOME CARE INSTRUCTIONS  Rest is needed to allow your body to heal. Routine activities can usually be resumed when comfortable. Injured tendons and bones can take up to 6 weeks to heal. Tendons are the cord-like structures that attach muscle to bone.  Ice following an injury helps keep the swelling down and reduces pain.  Put ice in a plastic bag.  Place a towel between your skin and the bag.  Leave the ice on for 15-20 minutes, 3-4 times a day, or as directed by your health care provider. Do this while awake, for the first 24 to 48 hours. After that, continue as directed by your caregiver.  Compression helps keep swelling down. It also gives support and helps with discomfort. If an elastic bandage has been applied, it should be removed and reapplied every 3 to 4 hours. It should not be applied tightly, but firmly enough to keep swelling down. Watch fingers or toes for swelling, bluish discoloration, coldness, numbness, or excessive pain. If any of these problems occur, remove the bandage and reapply loosely. Contact your caregiver if these problems continue.  Elevation helps reduce  swelling and decreases pain. With extremities, such as the arms, hands, legs, and feet, the injured area should be placed near or above the level of the heart, if possible. SEEK IMMEDIATE MEDICAL CARE IF:  You have persistent pain and swelling.  You develop redness, numbness, or unexpected weakness.  Your symptoms are getting worse rather than  improving after several days. These symptoms may indicate that further evaluation or further X-rays are needed. Sometimes, X-rays may not show a small broken bone (fracture) until 1 week or 10 days later. Make a follow-up appointment with your caregiver. Ask when your X-ray results will be ready. Make sure you get your X-ray results. Document Released: 12/17/2000 Document Revised: 09/09/2013 Document Reviewed: 02/03/2011 [facility] Patient Information 2015 [place], [place]. This information is not intended to replace advice given to you by your health care provider. Make sure you discuss any questions you have with your health care provider.

## 2014-11-27 ENCOUNTER — Other Ambulatory Visit: Payer: Self-pay | Admitting: Internal Medicine

## 2014-12-16 ENCOUNTER — Other Ambulatory Visit: Payer: Self-pay | Admitting: Internal Medicine

## 2014-12-16 ENCOUNTER — Other Ambulatory Visit: Payer: Self-pay | Admitting: Physician Assistant

## 2014-12-21 ENCOUNTER — Ambulatory Visit (INDEPENDENT_AMBULATORY_CARE_PROVIDER_SITE_OTHER): Payer: Managed Care, Other (non HMO) | Admitting: Internal Medicine

## 2014-12-21 VITALS — BP 144/88 | HR 84 | Temp 97.9°F | Resp 16 | Ht 70.0 in | Wt 212.4 lb

## 2014-12-21 DIAGNOSIS — K645 Perianal venous thrombosis: Secondary | ICD-10-CM

## 2014-12-21 NOTE — Progress Notes (Signed)
   Subjective:    Patient ID: [name], male    DOB: November 18, 1950, 64 y.o.   MRN: [id number]  HPI Edited by me, Josalin Carneiro  64 year old male here for rectal bleeding since Friday night. No pain with bowel movement, minor irritation. Will have spots of blood on undergarments. Notices blood on paper tissue after having bowel movement. No polyps on last colonoscopy.  No anorexia, weight loss, or fatigue. Has been well otherwise.  Review of Systems     Objective:   Physical Exam  Constitutional: He is oriented to person, place, and time. He appears well-developed and well-nourished. No distress.  HENT:  Head: Normocephalic.  Mouth/Throat: Oropharynx is clear and moist.  Eyes: Pupils are equal, round, and reactive to light. No scleral icterus.  Neck: Normal range of motion.  Cardiovascular: Normal rate.   Pulmonary/Chest: Effort normal.  Abdominal: Soft. He exhibits no mass.  Genitourinary: Prostate normal. Rectal exam shows external hemorrhoid and tenderness. Rectal exam shows no internal hemorrhoid, no fissure and no mass.  3-59mm thrombosed tender external hemorroid. Clot easily removed and no other hemorroids found.  Neurological: He is alert and oriented to person, place, and time. He exhibits normal muscle tone. Coordination normal.  Psychiatric: He has a normal mood and affect.          Assessment & Plan:  Thrombosed external roid/evacuated Home care taught

## 2014-12-21 NOTE — Patient Instructions (Signed)

## 2015-01-20 ENCOUNTER — Other Ambulatory Visit: Payer: Self-pay | Admitting: Internal Medicine

## 2015-02-28 IMAGING — CT CT CHEST W/O CM
2 of 3 series · 15 of 36 positions shown, 18 images · IV contrast (Omnipaque 300)
Comparison: 01/05/2014

CLINICAL DATA: Followup pulmonary nodules

EXAM:
CT CHEST WITHOUT CONTRAST
TECHNIQUE: Multidetector CT imaging of the chest was performed following the
standard protocol without IV contrast..

[Series 2: chest routine with · axial · 0.73mm/px · z∈[-154,+106]mm · 12 of 62 slices shown, 15 images]
[im 5/62  mediastinal]
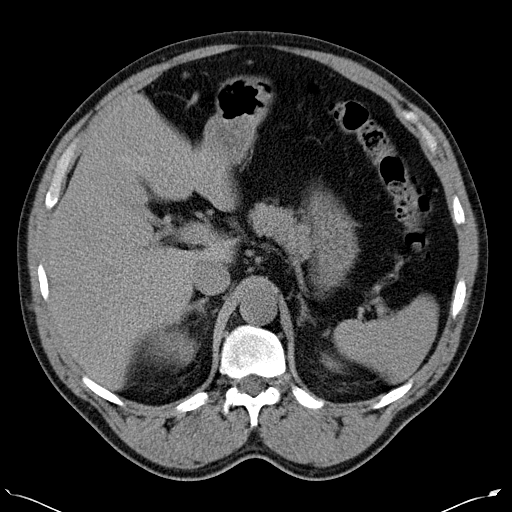
[im 5/62  lung]
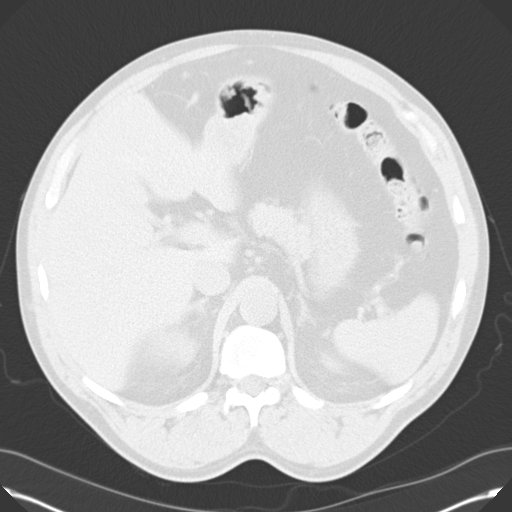
[im 10/62  lung]
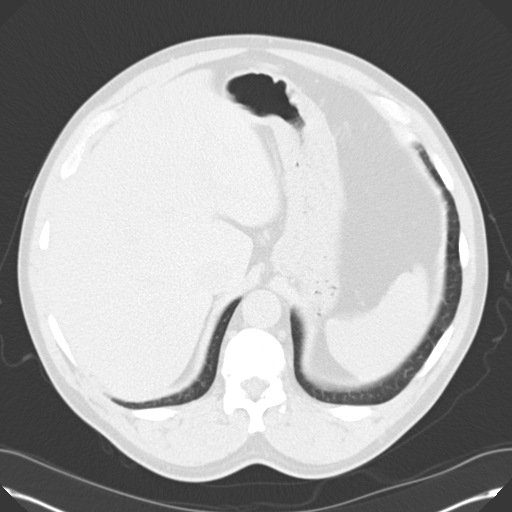
[im 14/62  lung]
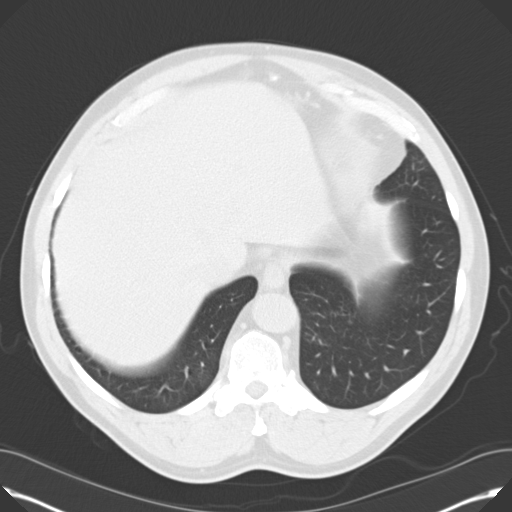
[im 19/62  lung]
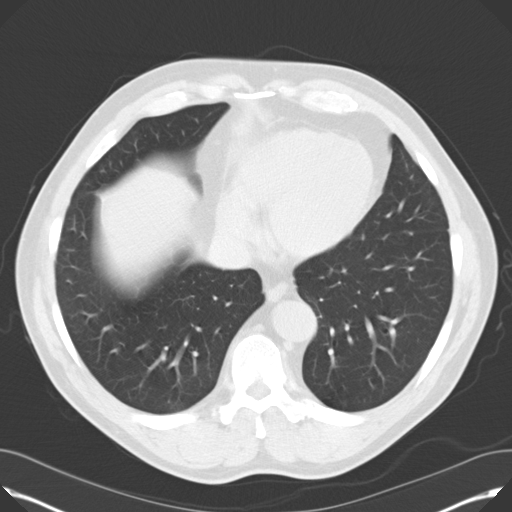
[im 23/62  mediastinal]
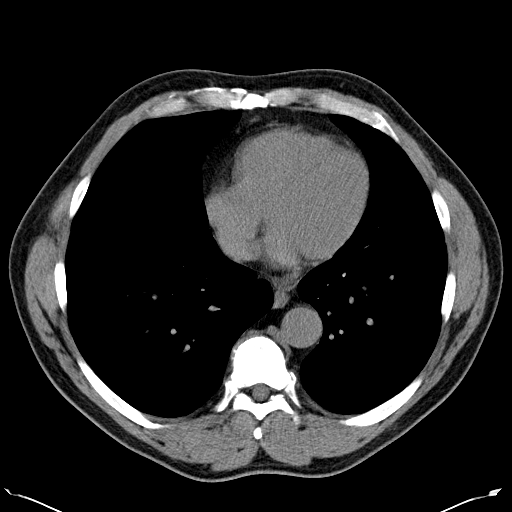
[im 23/62  lung]
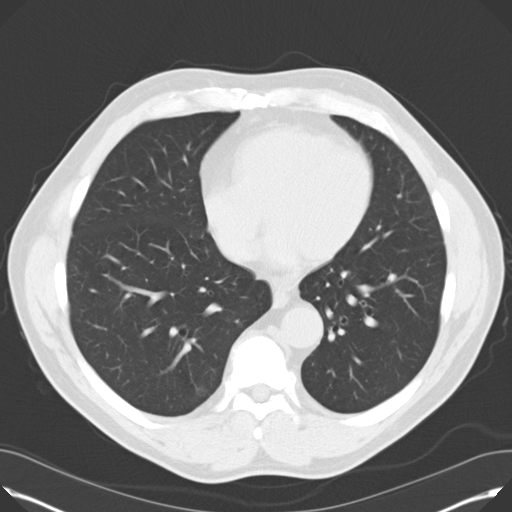
[im 28/62  lung]
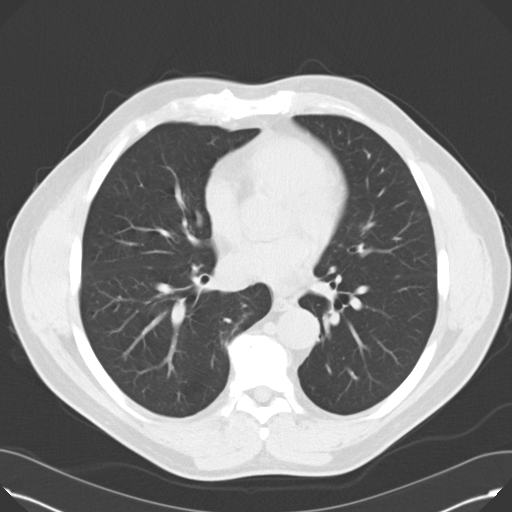
[im 34/62  lung]
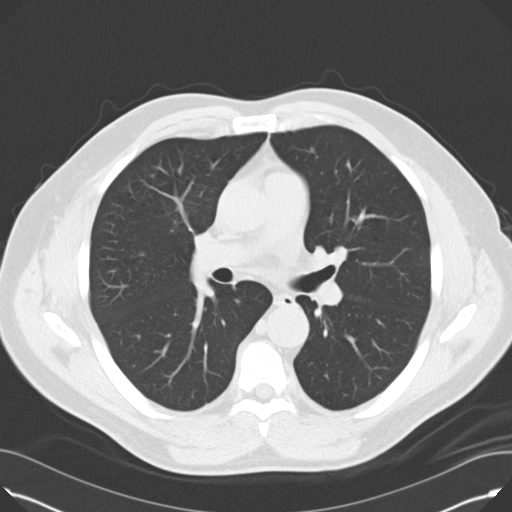
[im 39/62  lung]
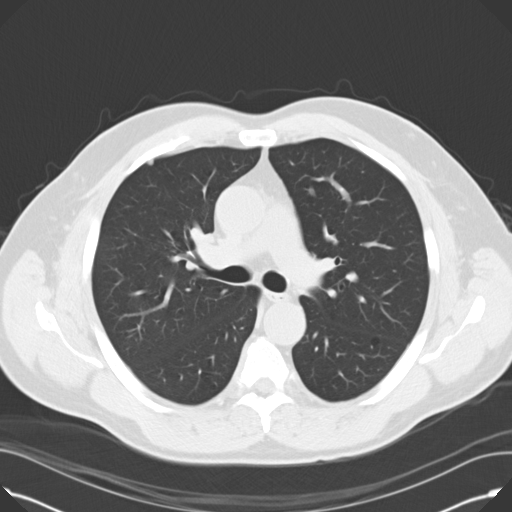
[im 43/62  mediastinal]
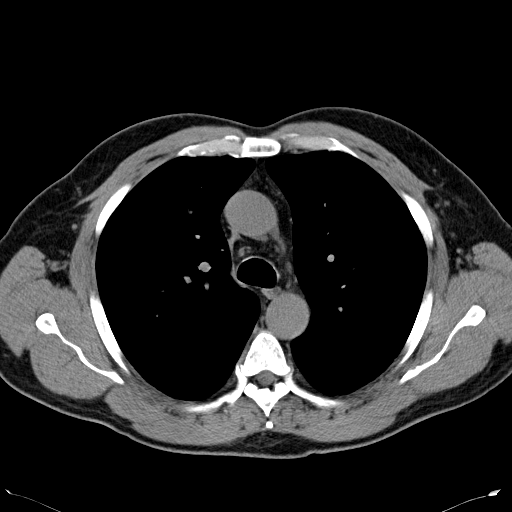
[im 43/62  lung]
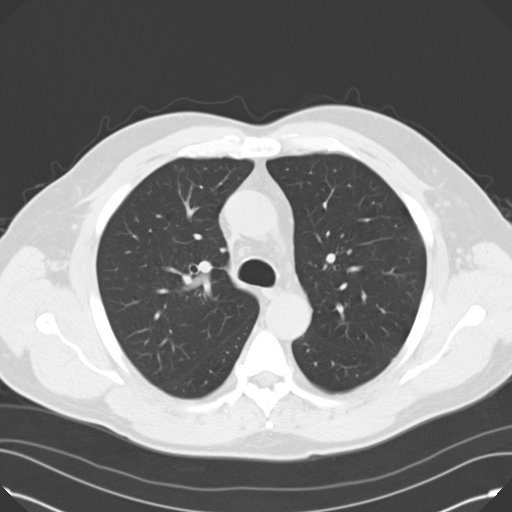
[im 48/62  lung]
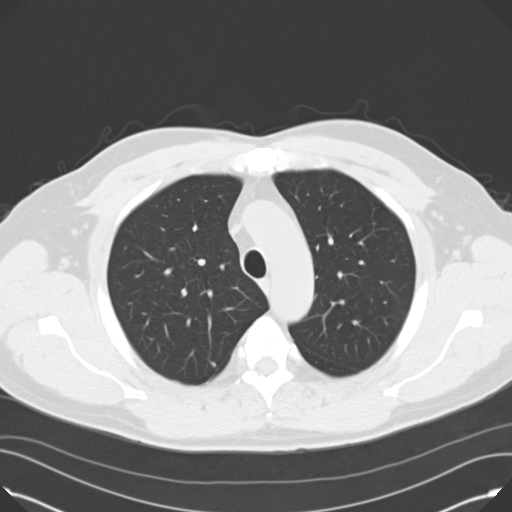
[im 52/62  lung]
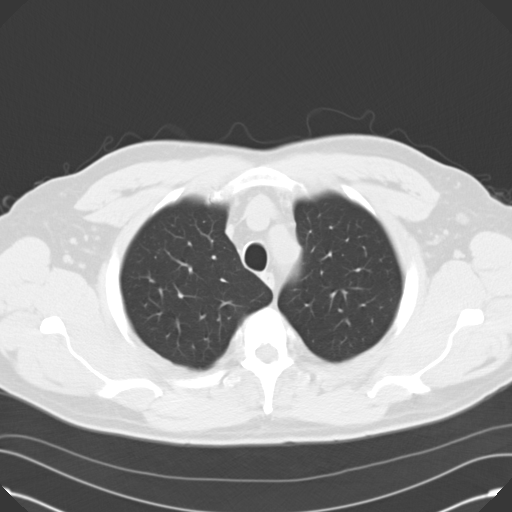
[im 57/62  lung]
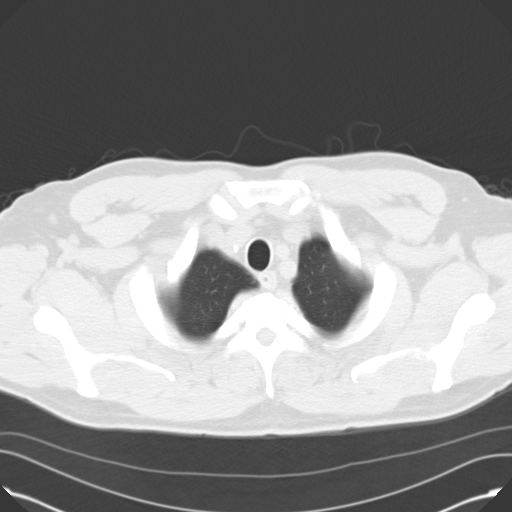

[Series 602: <mpr thick range> · coronal · 0.73mm/px · 3 of 142 slices shown]
[im 29/142  lung]
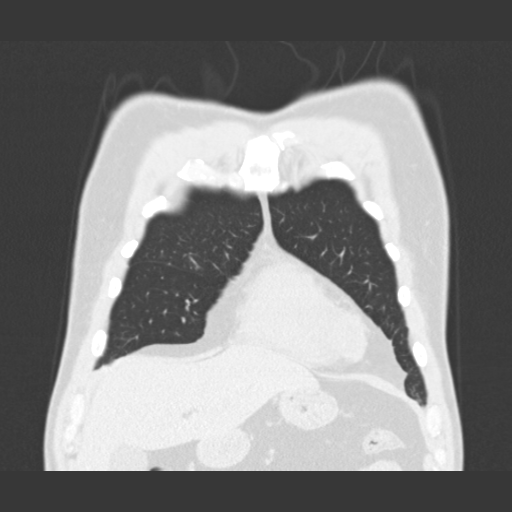
[im 57/142  lung]
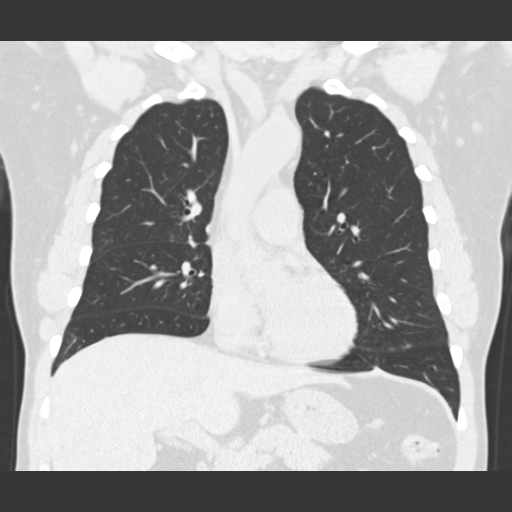
[im 85/142  lung]
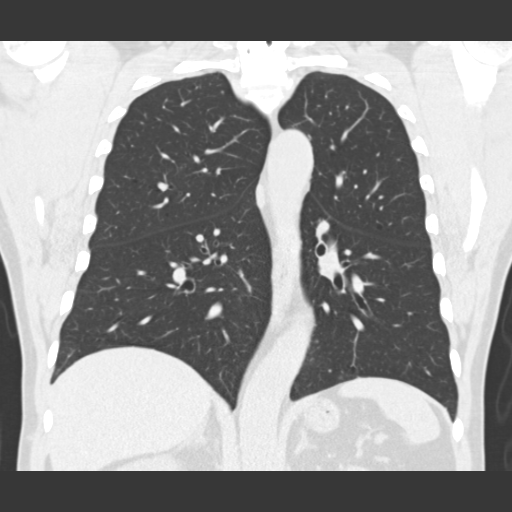

[15 of 36 positions shown; findings below may reference images not displayed]

FINDINGS: Mediastinum: Normal heart size. No pericardial effusion. The trachea
appears patent and is midline. Normal appearance of the esophagus.
No mediastinal or hilar adenopathy identified.

Lungs/Pleura: There is no pleural effusion identified. Index nodule
in the right upper lobe measures 4 mm and is unchanged from previous
exam. Subpleural nodule within the anterior right upper lobe is
unchanged measuring 4 mm, image 24/series 3. Subpleural right middle
lobe nodule is unchanged measuring 3 mm, image 33/series 3. No new
or enlarging pulmonary nodules or mass is identified. Sub solid
lesion within the right base measures 9 mm, image 105 of series 602.
This is stable when compared with 01/05/2014 but is new from
07/04/2013.

Upper Abdomen: The visualized portions of the liver, gallbladder,
and pancreas are unremarkable. The spleen is negative.

Musculoskeletal: Review of the visualized bony structures is on
unremarkable. No aggressive lytic or sclerotic bone lesions.
IMPRESSION: 1. Stable small solid pulmonary nodules. Compatible with benign
lesions.
2. There is a 9 mm sub solid lesion in the right base which is
unchanged from 01/05/2014 but new from 07/04/2013. This will need
annual surveillance for minimum of 36 months to document of
benignity. The next followup examination should be obtained on
07/14/2015. A PET-CT would be of limited value in evaluating this
lesion. This recommendation follows the consensus statement:
Recommendations for the Management of Subsolid Pulmonary Nodules
Detected at CT: A Statement from the [HOSPITAL] as published

## 2015-03-08 ENCOUNTER — Telehealth: Payer: Self-pay

## 2015-03-08 NOTE — Telephone Encounter (Signed)
m °

## 2015-03-12 ENCOUNTER — Other Ambulatory Visit: Payer: Self-pay | Admitting: Internal Medicine

## 2015-03-12 NOTE — Telephone Encounter (Signed)
Please let patient know that I will provide refill but he should know that if he has heart disease or a heart attack that he should let the medical staff know before receiving any nitrates for medication. He should also return to clinic for follow up on this issue to make sure that every thing else is okay including his hypertension. Thank you!

## 2015-03-15 ENCOUNTER — Ambulatory Visit (INDEPENDENT_AMBULATORY_CARE_PROVIDER_SITE_OTHER): Payer: Managed Care, Other (non HMO) | Admitting: Family Medicine

## 2015-03-15 ENCOUNTER — Telehealth: Payer: Self-pay

## 2015-03-15 VITALS — BP 138/88 | HR 85 | Temp 98.3°F | Resp 17 | Ht 69.5 in | Wt 209.0 lb

## 2015-03-15 DIAGNOSIS — I10 Essential (primary) hypertension: Secondary | ICD-10-CM

## 2015-03-15 DIAGNOSIS — N5201 Erectile dysfunction due to arterial insufficiency: Secondary | ICD-10-CM | POA: Diagnosis not present

## 2015-03-15 MED ORDER — TADALAFIL 20 MG PO TABS: 20.0000 mg | ORAL_TABLET | Freq: Every day | ORAL | Status: DC | PRN

## 2015-03-15 MED ORDER — AMLODIPINE BESYLATE 5 MG PO TABS: 5.0000 mg | ORAL_TABLET | Freq: Every day | ORAL | Status: DC

## 2015-03-15 NOTE — Telephone Encounter (Signed)
Patient requested coupons for CIALIS 20 MG tablet  and received coupons for crestor.   [phone]

## 2015-03-15 NOTE — Progress Notes (Signed)
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  No SOB, chest pain or edema  Out of medicine x 3 weeks.  Also needs refill on Cialis.  Notes chronic knee pain, sees Dr. Theda Sers.  Objective:  NAD BP 138/88 mmHg  Pulse 85  Temp(Src) 98.3 F (36.8 C) (Oral)  Resp 17  Ht 5' 9.5" (1.765 m)  Wt 209 lb (94.802 kg)  BMI 30.43 kg/m2  SpO2 98% HEENT: unremarkable Neck: supple, no bruit Chest:  Clear Heart:  Reg, I/VI systolic murmur Ext: no edema  Assessment:  Stable on amlodipine.  Interesting that BP is staying low despite missing so much medication  Plan:  Recommend CPE for annual screening This chart was scribed in my presence and reviewed by me personally.    ICD-9-CM ICD-10-CM   1. Essential hypertension 401.9 I10 amLODipine (NORVASC) 5 MG tablet  2. Erectile dysfunction due to arterial insufficiency 607.84 N52.01 tadalafil (CIALIS) 20 MG tablet     Signed, Robyn Haber, MD

## 2015-03-15 NOTE — Telephone Encounter (Signed)
Do we have coupons for Cialis?

## 2015-03-15 NOTE — Telephone Encounter (Signed)
I do not think we do - he should look on the internet to see if he can find one.

## 2015-03-16 ENCOUNTER — Telehealth: Payer: Self-pay

## 2015-03-16 MED ORDER — DEXLANSOPRAZOLE 60 MG PO CPDR: 60.0000 mg | DELAYED_RELEASE_CAPSULE | Freq: Every day | ORAL | Status: DC

## 2015-03-16 NOTE — Telephone Encounter (Signed)
Left a detailed message letting pt know.

## 2015-03-16 NOTE — Telephone Encounter (Signed)
Pt called and stated that he had told Dr L that he needed his heartburn medication and it was not sent into pharm at [place]. I had operator advise pt that I will send it in now. Done.

## 2015-05-07 ENCOUNTER — Telehealth: Payer: Self-pay | Admitting: Emergency Medicine

## 2015-05-07 DIAGNOSIS — R918 Other nonspecific abnormal finding of lung field: Secondary | ICD-10-CM

## 2015-05-07 NOTE — Telephone Encounter (Signed)
LMOAM for pt to return my call to schedule CT Chest in Oct 2016 is what is on the order that is in epic. However, that order was placed on 07/20/14 and has expired on 01/16/15, which is the only order I see. I will need a new referral placed for the CT Chest since the one placed on 07/20/14 has expired. Please adivse. Rhonda J Cobb

## 2015-05-07 NOTE — Telephone Encounter (Signed)
CT chest is already in epic. Please advise PCC's and then we can schedule appt. thanks

## 2015-05-07 NOTE — Telephone Encounter (Signed)
CT Chest appointment scheduled for Monday 07/12/15 at 10:30 at [facility]. Called and spoke with pt's wife and she is aware of appointment date,time and location. Rhonda J Cobb

## 2015-05-07 NOTE — Telephone Encounter (Signed)
Order re placed per Rhonda's request. Will await pt call back

## 2015-05-17 ENCOUNTER — Other Ambulatory Visit: Payer: Self-pay | Admitting: Urgent Care

## 2015-05-21 ENCOUNTER — Telehealth: Payer: Self-pay

## 2015-05-21 MED ORDER — SILDENAFIL CITRATE 20 MG PO TABS: ORAL_TABLET | ORAL | Status: DC

## 2015-05-21 NOTE — Telephone Encounter (Signed)
Refill on [place]

## 2015-07-12 ENCOUNTER — Ambulatory Visit (INDEPENDENT_AMBULATORY_CARE_PROVIDER_SITE_OTHER)
Admission: RE | Admit: 2015-07-12 | Discharge: 2015-07-12 | Disposition: A | Discharge status: Home / Self Care | Payer: Managed Care, Other (non HMO) | Source: Ambulatory Visit | Attending: Emergency Medicine | Admitting: Emergency Medicine

## 2015-07-12 DIAGNOSIS — R918 Other nonspecific abnormal finding of lung field: Secondary | ICD-10-CM | POA: Diagnosis not present

## 2015-08-02 ENCOUNTER — Telehealth: Payer: Self-pay | Admitting: Emergency Medicine

## 2015-08-02 ENCOUNTER — Ambulatory Visit: Payer: Self-pay | Admitting: Emergency Medicine

## 2015-08-02 NOTE — Telephone Encounter (Signed)
Will forward to [place] to follow as she has the forms.

## 2015-08-03 ENCOUNTER — Ambulatory Visit (INDEPENDENT_AMBULATORY_CARE_PROVIDER_SITE_OTHER): Payer: Managed Care, Other (non HMO) | Admitting: Internal Medicine

## 2015-08-03 VITALS — BP 128/82 | HR 77 | Temp 98.2°F | Resp 18 | Ht 71.0 in | Wt 214.6 lb

## 2015-08-03 DIAGNOSIS — Z0000 Encounter for general adult medical examination without abnormal findings: Secondary | ICD-10-CM

## 2015-08-03 DIAGNOSIS — M1711 Unilateral primary osteoarthritis, right knee: Secondary | ICD-10-CM

## 2015-08-03 DIAGNOSIS — K2100 Gastro-esophageal reflux disease with esophagitis, without bleeding: Secondary | ICD-10-CM

## 2015-08-03 DIAGNOSIS — M25561 Pain in right knee: Secondary | ICD-10-CM | POA: Diagnosis not present

## 2015-08-03 DIAGNOSIS — M129 Arthropathy, unspecified: Secondary | ICD-10-CM

## 2015-08-03 DIAGNOSIS — I10 Essential (primary) hypertension: Secondary | ICD-10-CM

## 2015-08-03 DIAGNOSIS — Z23 Encounter for immunization: Secondary | ICD-10-CM | POA: Diagnosis not present

## 2015-08-03 DIAGNOSIS — Z7189 Other specified counseling: Secondary | ICD-10-CM

## 2015-08-03 DIAGNOSIS — J301 Allergic rhinitis due to pollen: Secondary | ICD-10-CM

## 2015-08-03 DIAGNOSIS — G8929 Other chronic pain: Secondary | ICD-10-CM

## 2015-08-03 LAB — COMPREHENSIVE METABOLIC PANEL
ALBUMIN: 4.4 g/dL (ref 3.6–5.1)
ALT: 23 U/L (ref 9–46)
AST: 16 U/L (ref 10–35)
Alkaline Phosphatase: 74 U/L (ref 40–115)
BILIRUBIN TOTAL: 0.7 mg/dL (ref 0.2–1.2)
BUN: 13 mg/dL (ref 7–25)
CALCIUM: 9.6 mg/dL (ref 8.6–10.3)
CO2: 29 mmol/L (ref 20–31)
CREATININE: 0.92 mg/dL (ref 0.70–1.25)
Chloride: 104 mmol/L (ref 98–110)
GLUCOSE: 101 mg/dL — AB (ref 65–99)
Potassium: 4.5 mmol/L (ref 3.5–5.3)
Sodium: 140 mmol/L (ref 135–146)
Total Protein: 6.7 g/dL (ref 6.1–8.1)

## 2015-08-03 LAB — POCT CBC
Granulocyte percent: 51.1 %G (ref 37–80)
HCT, POC: 46.1 % (ref 43.5–53.7)
HEMOGLOBIN: 15.9 g/dL (ref 14.1–18.1)
Lymph, poc: 1.9 (ref 0.6–3.4)
MCH, POC: 30.5 pg (ref 27–31.2)
MCHC: 34.4 g/dL (ref 31.8–35.4)
MCV: 88.8 fL (ref 80–97)
MID (cbc): 0.4 (ref 0–0.9)
MPV: 9.1 fL (ref 0–99.8)
PLATELET COUNT, POC: 209 10*3/uL (ref 142–424)
POC Granulocyte: 2.4 (ref 2–6.9)
POC LYMPH PERCENT: 40.3 %L (ref 10–50)
POC MID %: 8.6 %M (ref 0–12)
RBC: 5.19 M/uL (ref 4.69–6.13)
RDW, POC: 13.6 %
WBC: 4.6 10*3/uL (ref 4.6–10.2)

## 2015-08-03 LAB — LIPID PANEL
CHOLESTEROL: 194 mg/dL (ref 125–200)
HDL: 36 mg/dL — ABNORMAL LOW (ref >=40)
LDL Cholesterol: 140 mg/dL — ABNORMAL HIGH (ref <130)
TRIGLYCERIDES: 91 mg/dL (ref <150)
Total CHOL/HDL Ratio: 5.4 Ratio — ABNORMAL HIGH (ref <=5.0)
VLDL: 18 mg/dL (ref <30)

## 2015-08-03 LAB — POCT URINALYSIS DIP (MANUAL ENTRY)
BILIRUBIN UA: NEGATIVE
Bilirubin, UA: NEGATIVE
Blood, UA: NEGATIVE
Glucose, UA: NEGATIVE
LEUKOCYTES UA: NEGATIVE
Nitrite, UA: NEGATIVE
Protein Ur, POC: NEGATIVE
Spec Grav, UA: 1.02
Urobilinogen, UA: 0.2
pH, UA: 7

## 2015-08-03 LAB — POC MICROSCOPIC URINALYSIS (UMFC): Mucus: ABSENT

## 2015-08-03 LAB — HEPATITIS C ANTIBODY: HCV AB: NEGATIVE

## 2015-08-03 LAB — GLUCOSE, POCT (MANUAL RESULT ENTRY): POC Glucose: 97 mg/dl (ref 70–99)

## 2015-08-03 LAB — TSH: TSH: 0.817 u[IU]/mL (ref 0.350–4.500)

## 2015-08-03 MED ORDER — AMLODIPINE BESYLATE 5 MG PO TABS: 5.0000 mg | ORAL_TABLET | Freq: Every day | ORAL | Status: DC

## 2015-08-03 MED ORDER — METHYLPREDNISOLONE ACETATE 80 MG/ML IJ SUSP
80.0000 mg | Freq: Once | INTRAMUSCULAR | Status: AC
  Administered 2015-08-03: 80 mg via INTRAMUSCULAR

## 2015-08-03 MED ORDER — FLUTICASONE PROPIONATE 50 MCG/ACT NA SUSP: 2.0000 | Freq: Every day | NASAL | Status: DC

## 2015-08-03 MED ORDER — HYDROCODONE-ACETAMINOPHEN 5-325 MG PO TABS: 1.0000 | ORAL_TABLET | Freq: Four times a day (QID) | ORAL | Status: DC | PRN

## 2015-08-03 MED ORDER — DEXLANSOPRAZOLE 60 MG PO CPDR: 60.0000 mg | DELAYED_RELEASE_CAPSULE | Freq: Every day | ORAL | Status: DC

## 2015-08-03 NOTE — Progress Notes (Signed)
Patient ID: [name], male   DOB: October 28, 1950, 64 y.o.   MRN: [id number]   08/03/2015 at 10:24 AM  [name] / DOB: [birth year] / MRN: [id number]  Problem list reviewed and updated by me where necessary.   SUBJECTIVE  [name] is a 64 y.o. well appearing male presenting for the chief complaint of need flu vaccine and annual exam..  Has hx of resolution of pulmonary nodule on CT chest in October.Has controlled HTN. Has strong family hx of cancer. Needs clearance for right TKR. Knee constantly painful.  He  has a past medical history of Allergy; GERD (gastroesophageal reflux disease); Hypertension; and Arthritis.    Medications reviewed and updated by myself where necessary, and exist elsewhere in the encounter.   Mr. [name] is allergic to percocet and amoxicillin. He  reports that he quit smoking about 15 years ago. His smoking use included Cigarettes. He has a 30 pack-year smoking history. He has never used smokeless tobacco. He reports that he uses illicit drugs (Cocaine). He reports that he does not drink alcohol. He  has no sexual activity history on file. The patient  has past surgical history that includes Knee surgery (Left, july 2014); Hernia repair (last done 3-4 yrs ago); Inguinal hernia repair (Right, 11/20/2013); and Insertion of mesh (Right, 11/20/2013).  His family history includes Arthritis in his sister; Cancer in his father, mother, and sister.  Review of Systems  Constitutional: Negative.   Eyes: Negative.   Respiratory: Positive for shortness of breath.   Cardiovascular: Negative.   Gastrointestinal: Negative.   Genitourinary: Negative.   Musculoskeletal: Positive for back pain, joint pain and neck pain.  Skin: Negative.   Neurological: Negative.   Endo/Heme/Allergies: Positive for environmental allergies.  Psychiatric/Behavioral: Negative.     OBJECTIVE  His  height is 5\' 11"  (1.803 m) and weight is 214 lb 9.6 oz (97.342 kg). His oral temperature is  98.2 F (36.8 C). His blood pressure is 128/82 and his pulse is 77. His respiration is 18 and oxygen saturation is 98%.  The patient's body mass index is 29.94 kg/(m^2).  Physical Exam  Constitutional: He is oriented to person, place, and time. He appears well-developed and well-nourished. No distress.  HENT:  Head: Normocephalic and atraumatic.  Right Ear: External ear normal.  Left Ear: External ear normal.  Nose: Nose normal.  Mouth/Throat: Oropharynx is clear and moist.  Eyes: Conjunctivae and EOM are normal. Pupils are equal, round, and reactive to light.  Neck: Normal range of motion. Neck supple. No tracheal deviation present. No thyromegaly present.  Cardiovascular: Normal rate, regular rhythm, normal heart sounds and intact distal pulses.   No murmur heard. Respiratory: Effort normal and breath sounds normal. No respiratory distress. He has no wheezes. He has no rales. He exhibits no tenderness.  GI: Soft. Bowel sounds are normal. He exhibits no mass. There is no tenderness.  Genitourinary: Rectum normal, prostate normal and penis normal.  Musculoskeletal: He exhibits edema and tenderness.       Right knee: He exhibits decreased range of motion, swelling, effusion, deformity, bony tenderness and abnormal meniscus. He exhibits no laceration, no erythema and normal alignment. Tenderness found.  Lymphadenopathy:    He has no cervical adenopathy.  Neurological: He is alert and oriented to person, place, and time. He has normal strength. No cranial nerve deficit or sensory deficit. He exhibits normal muscle tone. He displays a negative Romberg sign. Coordination normal.  Psychiatric: He has a normal mood and affect. His  behavior is normal.   EKG normal  Results for orders placed or performed in visit on 08/03/15 (from the past 24 hour(s))  POCT CBC     Status: None   Collection Time: 08/03/15  9:55 AM  Result Value Ref Range   WBC 4.6 4.6 - 10.2 K/uL   Lymph, poc 1.9 0.6 - 3.4    POC LYMPH PERCENT 40.3 10 - 50 %L   MID (cbc) 0.4 0 - 0.9   POC MID % 8.6 0 - 12 %M   POC Granulocyte 2.4 2 - 6.9   Granulocyte percent 51.1 37 - 80 %G   RBC 5.19 4.69 - 6.13 M/uL   Hemoglobin 15.9 14.1 - 18.1 g/dL   HCT, POC 46.1 43.5 - 53.7 %   MCV 88.8 80 - 97 fL   MCH, POC 30.5 27 - 31.2 pg   MCHC 34.4 31.8 - 35.4 g/dL   RDW, POC 13.6 %   Platelet Count, POC 209 142 - 424 K/uL   MPV 9.1 0 - 99.8 fL  POCT Microscopic Urinalysis ([facility])     Status: None   Collection Time: 08/03/15  9:55 AM  Result Value Ref Range   WBC,UR,HPF,POC None None WBC/hpf   RBC,UR,HPF,POC None None RBC/hpf   Bacteria None None, Too numerous to count   Mucus Absent Absent   Epithelial Cells, UR Per Microscopy None None, Too numerous to count cells/hpf  POCT urinalysis dipstick     Status: None   Collection Time: 08/03/15  9:55 AM  Result Value Ref Range   Color, UA yellow yellow   Clarity, UA clear clear   Glucose, UA negative negative   Bilirubin, UA negative negative   Ketones, POC UA negative negative   Spec Grav, UA 1.020    Blood, UA negative negative   pH, UA 7.0    Protein Ur, POC negative negative   Urobilinogen, UA 0.2    Nitrite, UA Negative Negative   Leukocytes, UA Negative Negative  POCT glucose (manual entry)     Status: None   Collection Time: 08/03/15  9:55 AM  Result Value Ref Range   POC Glucose 97 70 - 99 mg/dl    ASSESSMENT & PLAN  Raydel was seen today for annual exam and immunizations.  Diagnoses and all orders for this visit:  Annual physical exam -     POCT CBC -     POCT Microscopic Urinalysis ([facility]) -     POCT urinalysis dipstick -     POCT glucose (manual entry) -     Comprehensive metabolic panel -     Lipid panel -     PSA -     TSH -     Hepatitis C antibody -     EKG 12-Lead  Essential hypertension -     POCT CBC -     POCT Microscopic Urinalysis ([facility]) -     POCT urinalysis dipstick -     POCT glucose (manual entry) -     Comprehensive  metabolic panel -     Lipid panel -     PSA -     TSH -     Hepatitis C antibody -     EKG 12-Lead  Encounter for medication review and counseling -     POCT CBC -     POCT Microscopic Urinalysis ([facility]) -     POCT urinalysis dipstick -     POCT glucose (manual entry) -  Comprehensive metabolic panel -     Lipid panel -     PSA -     TSH -     Hepatitis C antibody -     EKG 12-Lead  Needs flu shot -     POCT CBC -     POCT Microscopic Urinalysis ([facility]) -     POCT urinalysis dipstick -     POCT glucose (manual entry) -     Comprehensive metabolic panel -     Lipid panel -     PSA -     TSH -     Hepatitis C antibody -     EKG 12-Lead -     Flu Vaccine QUAD 36+ mos IM  Knee pain, chronic, right -     methylPREDNISolone acetate (DEPO-MEDROL) injection 80 mg; Inject 1 mL (80 mg total) into the muscle once. -     HYDROcodone-acetaminophen (NORCO/VICODIN) 5-325 MG tablet; Take 1 tablet by mouth every 6 (six) hours as needed.  Arthritis of knee, right -     HYDROcodone-acetaminophen (NORCO/VICODIN) 5-325 MG tablet; Take 1 tablet by mouth every 6 (six) hours as needed.

## 2015-08-03 NOTE — Patient Instructions (Addendum)
Immunization Schedule, Adult  Influenza vaccine.  All adults should be immunized every year.  All adults, including pregnant women and people with hives-only allergy to eggs can receive the inactivated influenza (IIV) vaccine.  Adults aged 64-49 years can receive the recombinant influenza (RIV) vaccine. The RIV vaccine does not contain any egg protein.  Adults aged 76 years or older can receive the standard-dose IIV or the high-dose IIV.  Tetanus, diphtheria, and acellular pertussis (Td, Tdap) vaccine.  Pregnant women should receive 1 dose of Tdap vaccine during each pregnancy. The dose should be obtained regardless of the length of time since the last dose. Immunization is preferred during the 27th to 36th week of gestation.  An adult who has not previously received Tdap or who does not know his or her vaccine status should receive 1 dose of Tdap. This initial dose should be followed by tetanus and diphtheria toxoids (Td) booster doses every 10 years.  Adults with an unknown or incomplete history of completing a 3-dose immunization series with Td-containing vaccines should begin or complete a primary immunization series including a Tdap dose.  Adults should receive a Td booster every 10 years.  Varicella vaccine.  An adult without evidence of immunity to varicella should receive 2 doses or a second dose if he or she has previously received 1 dose.  Pregnant females who do not have evidence of immunity should receive the first dose after pregnancy. This first dose should be obtained before leaving the health care facility. The second dose should be obtained 4-8 weeks after the first dose.  Human papillomavirus (HPV) vaccine.  Females aged 13-26 years who have not received the vaccine previously should obtain the 3-dose series.  The vaccine is not recommended for use in pregnant females. However, pregnancy testing is not needed before receiving a dose. If a male is found to be  pregnant after receiving a dose, no treatment is needed. In that case, the remaining doses should be delayed until after the pregnancy.  Males aged 37-21 years who have not received the vaccine previously should receive the 3-dose series. Males aged 22-26 years may be immunized.  Immunization is recommended through the age of 93 years for any male who has sex with males and did not get any or all doses earlier.  Immunization is recommended for any person with an immunocompromised condition through the age of 71 years if he or she did not get any or all doses earlier.  During the 3-dose series, the second dose should be obtained 4-8 weeks after the first dose. The third dose should be obtained 24 weeks after the first dose and 16 weeks after the second dose.  Zoster vaccine.  One dose is recommended for adults aged 73 years or older unless certain conditions are present.  Measles, mumps, and rubella (MMR) vaccine.  Adults born before 35 generally are considered immune to measles and mumps.  Adults born in [birth year] or later should have 1 or more doses of MMR vaccine unless there is a contraindication to the vaccine or there is laboratory evidence of immunity to each of the three diseases.  A routine second dose of MMR vaccine should be obtained at least 28 days after the first dose for students attending postsecondary schools, health care workers, or international travelers.  People who received inactivated measles vaccine or an unknown type of measles vaccine during 1963-1967 should receive 2 doses of MMR vaccine.  People who received inactivated mumps vaccine or an unknown type  of mumps vaccine before 1979 and are at high risk for mumps infection should consider immunization with 2 doses of MMR vaccine.  For females of childbearing age, rubella immunity should be determined. If there is no evidence of immunity, females who are not pregnant should be vaccinated. If there is no evidence of  immunity, females who are pregnant should delay immunization until after pregnancy.  Unvaccinated health care workers born before 1957 who lack laboratory evidence of measles, mumps, or rubella immunity or laboratory confirmation of disease should consider measles and mumps immunization with 2 doses of MMR vaccine or rubella immunization with 1 dose of MMR vaccine.  Pneumococcal 13-valent conjugate (PCV13) vaccine.  When indicated, a person who is uncertain of his or her immunization history and has no record of immunization should receive the PCV13 vaccine.  An adult aged 19 years or older who has certain medical conditions and has not been previously immunized should receive 1 dose of PCV13 vaccine. This PCV13 should be followed with a dose of pneumococcal polysaccharide (PPSV23) vaccine. The PPSV23 vaccine dose should be obtained at least 8 weeks after the dose of PCV13 vaccine.  An adult aged 19 years or older who has certain medical conditions and previously received 1 or more doses of PPSV23 vaccine should receive 1 dose of PCV13. The PCV13 vaccine dose should be obtained 1 or more years after the last PPSV23 vaccine dose.  Pneumococcal polysaccharide (PPSV23) vaccine.  When PCV13 is also indicated, PCV13 should be obtained first.  All adults aged 65 years and older should be immunized.  An adult younger than age 65 years who has certain medical conditions should be immunized.  Any person who resides in a nursing home or long-term care facility should be immunized.  An adult smoker should be immunized.  People with an immunocompromised condition and certain other conditions should receive both PCV13 and PPSV23 vaccines.  People with human immunodeficiency virus (HIV) infection should be immunized as soon as possible after diagnosis.  Immunization during chemotherapy or radiation therapy should be avoided.  Routine use of PPSV23 vaccine is not recommended for American Indians,  [place]atives, or people younger than 65 years unless there are medical conditions that require PPSV23 vaccine.  When indicated, people who have unknown immunization and have no record of immunization should receive PPSV23 vaccine.  One-time revaccination 5 years after the first dose of PPSV23 is recommended for people aged 19-64 years who have chronic kidney failure, nephrotic syndrome, asplenia, or immunocompromised conditions.  People who received 1-2 doses of PPSV23 before age 65 years should receive another dose of PPSV23 vaccine at age 65 years or later if at least 5 years have passed since the previous dose.  Doses of PPSV23 are not needed for people immunized with PPSV23 at or after age 65 years.  Meningococcal vaccine.  Adults with asplenia or persistent complement component deficiencies should receive 2 doses of quadrivalent meningococcal conjugate (MenACWY-D) vaccine. The doses should be obtained at least 2 months apart.  Microbiologists working with certain meningococcal bacteria, military recruits, people at risk during an outbreak, and people who travel to or live in countries with a high rate of meningitis should be immunized.  A first-year college student up through age 21 years who is living in a residence hall should receive a dose if he or she did not receive a dose on or after his or her 16th birthday.  Adults who have certain high-risk conditions should receive one or more doses   of vaccine.  Hepatitis A vaccine.  Adults who wish to be protected from this disease, have certain high-risk conditions, work with hepatitis A-infected animals, work in hepatitis A research labs, or travel to or work in countries with a high rate of hepatitis A should be immunized.  Adults who were previously unvaccinated and who anticipate close contact with an international adoptee during the first 60 days after arrival in the [place] from a country with a high rate of hepatitis A should  be immunized.  Hepatitis B vaccine.  Adults who wish to be protected from this disease, have certain high-risk conditions, may be exposed to blood or other infectious body fluids, are household contacts or sex partners of hepatitis B positive people, are clients or workers in certain care facilities, or travel to or work in countries with a high rate of hepatitis B should be immunized.  Haemophilus influenzae type b (Hib) vaccine.  A previously unvaccinated person with asplenia or sickle cell disease or having a scheduled splenectomy should receive 1 dose of Hib vaccine.  Regardless of previous immunization, a recipient of a hematopoietic stem cell transplant should receive a 3-dose series 6-12 months after his or her successful transplant.  Hib vaccine is not recommended for adults with HIV infection.   This information is not intended to replace advice given to you by your health care provider. Make sure you discuss any questions you have with your health care provider.   Document Released: 11/25/2003 Document Revised: 12/30/2012 Document Reviewed: 10/22/2012 Elsevier Interactive Patient Education 2016 Elsevier Inc. Osteoarthritis Osteoarthritis is a disease that causes soreness and inflammation of a joint. It occurs when the cartilage at the affected joint wears down. Cartilage acts as a cushion, covering the ends of bones where they meet to form a joint. Osteoarthritis is the most common form of arthritis. It often occurs in older people. The joints affected most often by this condition include those in the:  Ends of the fingers.  Thumbs.  Neck.  Lower back.  Knees.  Hips. CAUSES  Over time, the cartilage that covers the ends of bones begins to wear away. This causes bone to rub on bone, producing pain and stiffness in the affected joints.  RISK FACTORS Certain factors can increase your chances of having osteoarthritis, including:  Older age.  Excessive body  weight.  Overuse of joints.  Previous joint injury. SIGNS AND SYMPTOMS   Pain, swelling, and stiffness in the joint.  Over time, the joint may lose its normal shape.  Small deposits of bone (osteophytes) may grow on the edges of the joint.  Bits of bone or cartilage can break off and float inside the joint space. This may cause more pain and damage. DIAGNOSIS  Your health care provider will do a physical exam and ask about your symptoms. Various tests may be ordered, such as:  X-rays of the affected joint.  Blood tests to rule out other types of arthritis. Additional tests may be used to diagnose your condition. TREATMENT  Goals of treatment are to control pain and improve joint function. Treatment plans may include:  A prescribed exercise program that allows for rest and joint relief.  A weight control plan.  Pain relief techniques, such as:  Properly applied heat and cold.  Electric pulses delivered to nerve endings under the skin (transcutaneous electrical nerve stimulation [TENS]).  Massage.  Certain nutritional supplements.  Medicines to control pain, such as:  Acetaminophen.  Nonsteroidal anti-inflammatory drugs (NSAIDs), such as  naproxen.  Narcotic or central-acting agents, such as tramadol.  Corticosteroids. These can be given orally or as an injection.  Surgery to reposition the bones and relieve pain (osteotomy) or to remove loose pieces of bone and cartilage. Joint replacement may be needed in advanced states of osteoarthritis. HOME CARE INSTRUCTIONS   Take medicines only as directed by your health care provider.  Maintain a healthy weight. Follow your health care provider's instructions for weight control. This may include dietary instructions.  Exercise as directed. Your health care provider can recommend specific types of exercise. These may include:  Strengthening exercises. These are done to strengthen the muscles that support joints affected  by arthritis. They can be performed with weights or with exercise bands to add resistance.  Aerobic activities. These are exercises, such as brisk walking or low-impact aerobics, that get your heart pumping.  Range-of-motion activities. These keep your joints limber.  Balance and agility exercises. These help you maintain daily living skills.  Rest your affected joints as directed by your health care provider.  Keep all follow-up visits as directed by your health care provider. SEEK MEDICAL CARE IF:   Your skin turns red.  You develop a rash in addition to your joint pain.  You have worsening joint pain.  You have a fever along with joint or muscle aches. SEEK IMMEDIATE MEDICAL CARE IF:  You have a significant loss of weight or appetite.  You have night sweats. [place] of Arthritis and Musculoskeletal and Skin Diseases: www.niams.SouthExposed.es  [employer] on Aging: http://kim-miller.com/  American College of Rheumatology: www.rheumatology.org   This information is not intended to replace advice given to you by your health care provider. Make sure you discuss any questions you have with your health care provider.   Document Released: 09/04/2005 Document Revised: 09/25/2014 Document Reviewed: 05/12/2013 Elsevier Interactive Patient Education [employer].

## 2015-08-03 NOTE — Addendum Note (Signed)
Addended by: Orma Flaming on: 08/03/2015 11:26 AM   Modules accepted: Orders

## 2015-08-04 LAB — PSA: PSA: 4.75 ng/mL — ABNORMAL HIGH (ref <=4.00)

## 2015-08-09 ENCOUNTER — Ambulatory Visit (INDEPENDENT_AMBULATORY_CARE_PROVIDER_SITE_OTHER): Payer: Managed Care, Other (non HMO) | Admitting: Emergency Medicine

## 2015-08-09 ENCOUNTER — Encounter: Payer: Self-pay | Admitting: Emergency Medicine

## 2015-08-09 VITALS — BP 134/92 | HR 81 | Ht 71.0 in | Wt 219.0 lb

## 2015-08-09 DIAGNOSIS — J449 Chronic obstructive pulmonary disease, unspecified: Secondary | ICD-10-CM

## 2015-08-09 DIAGNOSIS — R918 Other nonspecific abnormal finding of lung field: Secondary | ICD-10-CM

## 2015-08-09 MED ORDER — TIOTROPIUM BROMIDE MONOHYDRATE 18 MCG IN CAPS: 18.0000 ug | ORAL_CAPSULE | Freq: Every day | RESPIRATORY_TRACT | Status: DC

## 2015-08-09 NOTE — Patient Instructions (Signed)
You are a mildly increased risk for surgery due to your history of smoking, but this does not mean you cannot have surgery.  Your CT scan of the chest shows that your pulmonary nodules are stable, are likely benign. You should not require any further CT scans to follow these.  We will try starting spiriva one inhalation daily to see if you benefit. If so then please call our office and we will call in a prescription for you.  Follow with Dr [name] in 6 months or sooner if you have any problems

## 2015-08-09 NOTE — Assessment & Plan Note (Signed)
Confirmed on spirometry today. He has mild-to-moderate obstruction and symptoms consistent with COPD. Will start Spiriva to see if he benefits. He will try this for 3-4 weeks and if he improves then will call us and we will maintain him on this medication. Continue albuterol when necessary. Discussed his future a surgery with him today. His COPD puts him at mildly increased risk for surgery but does not preclude the operation and the benefits outweigh these risks

## 2015-08-09 NOTE — Assessment & Plan Note (Signed)
Stable by CT scan for 2 years. Right lower lobe ground glass opacity has resolved. He does not need a repeat CT scan for these nodules

## 2015-08-09 NOTE — Progress Notes (Signed)
Subjective:    Patient ID: [name], male    DOB: October 11, 1950, 64 y.o.   MRN: [id number]  HPI 64 yo former smoker (30 pk-yrs), HTN, GERD, allergies, OA. Referred today for pulmonary nodules on CT chest.  He underwent CXR in 06/2013 that showed possible nodule, prompted CT chest that confirmed scattered sub-36mm nodules. Repeat CT 4/15 showed that the nodules are stable, also a new small area of GGI. He has some occasional wheeze, is able to stay active. He occasionally gets choked on her own secretions. He is on ACE-i.   ROV 07/20/14 -- f/u visit for hx tobacco, pulmonary nodules that have been scanned first in 10/'14, then stable 01/05/14. There was a small GGI to follow as well. He underwent PFTs in July 2015 that showed mild obstructive lung disease. CT scan from 07/13/14 is stable, both the nodules and the R basilar GG nodule. Next scan should be in 1 yr.   ROV 09/21/14 -- follow up for obstructive lung disease and pulmonary nodules that are been followed by serial CT scans. The last scan was on 07/13/14 and was stable.   ROV 08/09/15 -- follow-up visit for scattered pulmonary nodules by CT scan of the chest as well as mild obstructive lung disease in the setting of former tobacco use. Repeat CT scan of his chest was done 07/12/15 that I personally reviewed. This showed unchanged bilateral small pulmonary nodules. A 9 mm some solid ground glass opacity in the posterior right lower lobe has resolved. He should not need any subsequent scans. He is planning to undergo R knee replacement with Dr Theda Sers. He reports some occasional exertional SOB with strenuous work, some rare cough. He hears some wheeze when he lays supine. He uses albuterol 2x a week. Hard to tell if the albuterol helps him. We did spirometry today > FEV1 2.47L (78% predicted).    Review of Systems  Constitutional: Negative for fever and unexpected weight change.  HENT: Positive for sinus pressure and trouble swallowing. Negative  for congestion, dental problem, ear pain, nosebleeds, postnasal drip, rhinorrhea, sneezing and sore throat.   Eyes: Negative for redness and itching.  Respiratory: Positive for shortness of breath and wheezing. Negative for cough and chest tightness.   Cardiovascular: Positive for leg swelling. Negative for palpitations.       Knees   Gastrointestinal: Negative for nausea and vomiting.  Genitourinary: Negative for dysuria.  Musculoskeletal: Negative for joint swelling.  Skin: Negative for rash.  Neurological: Negative for headaches.  Hematological: Does not bruise/bleed easily.  Psychiatric/Behavioral: Negative for dysphoric mood. The patient is not nervous/anxious.         Objective:   Physical Exam Filed Vitals:   08/09/15 1043  BP: 134/92  Pulse: 81  Height: 5\' 11"  (1.803 m)  Weight: 219 lb (99.338 kg)  SpO2: 93%   Gen: Pleasant, well-nourished, in no distress,  normal affect  ENT: No lesions,  mouth clear,  oropharynx clear, no postnasal drip  Neck: No JVD, no TMG, no carotid bruits  Lungs: No use of accessory muscles, clear without rales or rhonchi  Cardiovascular: RRR, heart sounds normal, no murmur or gallops, no peripheral edema  Musculoskeletal: No deformities, no cyanosis or clubbing  Neuro: alert, non focal  Skin: Warm, no lesions or rashes  07/13/14 --  COMPARISON: 01/05/2014  FINDINGS: Mediastinum: Normal heart size. No pericardial effusion. The trachea appears patent and is midline. Normal appearance of the esophagus. No mediastinal or hilar adenopathy identified.  Lungs/Pleura:  There is no pleural effusion identified. Index nodule in the right upper lobe measures 4 mm and is unchanged from previous exam. Subpleural nodule within the anterior right upper lobe is unchanged measuring 4 mm, image 24/series 3. Subpleural right middle lobe nodule is unchanged measuring 3 mm, image 33/series 3. No new or enlarging pulmonary nodules or mass is  identified. Sub solid lesion within the right base measures 9 mm, image 105 of series 602. This is stable when compared with 01/05/2014 but is new from 07/04/2013.  Upper Abdomen: The visualized portions of the liver, gallbladder, and pancreas are unremarkable. The spleen is negative.  Musculoskeletal: Review of the visualized bony structures is on unremarkable. No aggressive lytic or sclerotic bone lesions.  IMPRESSION: 1. Stable small solid pulmonary nodules. Compatible with benign lesions. 2. There is a 9 mm sub solid lesion in the right base which is unchanged from 01/05/2014 but new from 07/04/2013. This will need annual surveillance for minimum of 36 months to document of benignity. The next followup examination should be obtained on 07/14/2015. A PET-CT would be of limited value in evaluating this lesion. This recommendation follows the consensus statement: Recommendations for the Management of Subsolid Pulmonary Nodules Detected at CT: A Statement from the [place] as published in Radiology 2013; 266:304-317.     Assessment & Plan:  COPD, mild Confirmed on spirometry today. He has mild-to-moderate obstruction and symptoms consistent with COPD. Will start Spiriva to see if he benefits. He will try this for 3-4 weeks and if he improves then will call us and we will maintain him on this medication. Continue albuterol when necessary. Discussed his future a surgery with him today. His COPD puts him at mildly increased risk for surgery but does not preclude the operation and the benefits outweigh these risks  Pulmonary nodules Stable by CT scan for 2 years. Right lower lobe ground glass opacity has resolved. He does not need a repeat CT scan for these nodules

## 2015-08-16 ENCOUNTER — Telehealth: Payer: Self-pay | Admitting: Family Medicine

## 2015-08-16 NOTE — Telephone Encounter (Signed)
Dr. Elder Cyphers wanted patient to call tomorrow (Tuesday 08/17/15) to discuss labs. Patient notified and voiced understanding. He was given Dr. Elder Cyphers hours, which are 8-2. He also was notified and voiced understanding regarding lab results. Lab results printed and mailed to patient.

## 2015-08-17 ENCOUNTER — Telehealth: Payer: Self-pay | Admitting: Internal Medicine

## 2015-08-17 ENCOUNTER — Telehealth: Payer: Self-pay

## 2015-08-17 ENCOUNTER — Other Ambulatory Visit: Payer: Self-pay | Admitting: Internal Medicine

## 2015-08-17 DIAGNOSIS — M25561 Pain in right knee: Principal | ICD-10-CM

## 2015-08-17 DIAGNOSIS — G8929 Other chronic pain: Secondary | ICD-10-CM

## 2015-08-17 DIAGNOSIS — M1711 Unilateral primary osteoarthritis, right knee: Secondary | ICD-10-CM

## 2015-08-17 DIAGNOSIS — R972 Elevated prostate specific antigen [PSA]: Secondary | ICD-10-CM

## 2015-08-17 MED ORDER — HYDROCODONE-ACETAMINOPHEN 5-325 MG PO TABS: ORAL_TABLET | ORAL | Status: DC

## 2015-08-17 NOTE — Telephone Encounter (Signed)
Called Pt. To let him know his Rx is ready for pick up

## 2015-08-17 NOTE — Telephone Encounter (Signed)
Spoke with Mr. [name], his PSA is rising and we will refer back to his urologist Dr. Diona Fanti. Also his knee pain is worse and constant he has moved TKR up for December 19 with Dr. Alvan Dame. He needs refill of his pain meds to get him thru till then.

## 2015-08-20 NOTE — H&P (Signed)
TOTAL KNEE ADMISSION H&P  Patient is being admitted for right total knee arthroplasty.  Subjective:  Chief Complaint:   Right knee primary OA / pain  HPI: [name], 64 y.o. male, has a history of pain and functional disability in the right knee due to arthritis and has failed non-surgical conservative treatments for greater than 12 weeks to include NSAID's and/or analgesics, corticosteriod injections and activity modification.  Onset of symptoms was gradual, starting 2+ years ago with gradually worsening course since that time. The patient noted prior procedures on the knee to include  arthroscopy and menisectomy on the right knee(s).  Patient currently rates pain in the right knee(s) at 8 out of 10 with activity. Patient has worsening of pain with activity and weight bearing, pain that interferes with activities of daily living, pain with passive range of motion, crepitus and joint swelling.  Patient has evidence of periarticular osteophytes and joint space narrowing by imaging studies.  There is no active infection.  Risks, benefits and expectations were discussed with the patient.  Risks including but not limited to the risk of anesthesia, blood clots, nerve damage, blood vessel damage, failure of the prosthesis, infection and up to and including death.  Patient understand the risks, benefits and expectations and wishes to proceed with surgery.   PCP: Kennon Portela, MD  D/C Plans:      Home with HHPT / SNF  Post-op Meds:       No Rx given  Tranexamic Acid:      To be given - IV   Decadron:      Is to be given  FYI:     ASA post-op  Norco post-op   Patient Active Problem List   Diagnosis Date Noted  . COPD, mild ([place]) 07/20/2014  . Pulmonary nodules 02/23/2014  . Tobacco use disorder, moderate, in sustained remission 02/23/2014  . Recurrent right inguinal hernia 10/20/2013  . ED (erectile dysfunction) 08/26/2012  . Physical exam, annual 11/05/2011  . HTN (hypertension)  11/05/2011  . GERD (gastroesophageal reflux disease) 11/05/2011  . Chronic joint pain 11/05/2011   Past Medical History  Diagnosis Date  . Allergy     seasonal  . GERD (gastroesophageal reflux disease)   . Hypertension   . Arthritis     oa    Past Surgical History  Procedure Laterality Date  . Knee surgery Left july 2014    mcl and meniscus repair  . Hernia repair  last done 3-4 yrs ago    x 2  . Inguinal hernia repair Right 11/20/2013    Procedure: OPEN REPAIR OF RECURRENT RIGHT INGUINAL HERNIA WITH MESH;  Surgeon: [name]. [name], MD;  Location: [facility];  Service: General;  Laterality: Right;  . Insertion of mesh Right 11/20/2013    Procedure: INSERTION OF MESH;  Surgeon: [name]. [name], MD;  Location: [facility];  Service: General;  Laterality: Right;    No prescriptions prior to admission   Allergies  Allergen Reactions  . Percocet [Oxycodone-Acetaminophen]     Nausea-Doesn't work  . Amoxicillin Rash    Social History  Substance Use Topics  . Smoking status: Former Smoker -- 1.50 packs/day for 20 years    Types: Cigarettes    Quit date: 11/05/1999  . Smokeless tobacco: Never Used  . Alcohol Use: No    Family History  Problem Relation Age of Onset  . Cancer Mother   . Cancer Father   . Cancer Sister   .  Arthritis Sister      Review of Systems  Constitutional: Negative.   HENT: Negative.   Eyes: Negative.   Respiratory: Positive for shortness of breath (on exertion).   Cardiovascular: Negative.   Gastrointestinal: Positive for heartburn.  Genitourinary: Positive for frequency.  Musculoskeletal: Positive for joint pain.  Skin: Negative.   Neurological: Negative.   Endo/Heme/Allergies: Positive for environmental allergies.  Psychiatric/Behavioral: Negative.     Objective:  Physical Exam  Constitutional: He is oriented to person, place, and time. He appears well-developed.  HENT:  Head: Normocephalic and atraumatic.  Eyes: Pupils are equal, round, and  reactive to light.  Neck: Neck supple. No JVD present. No tracheal deviation present. No thyromegaly present.  Cardiovascular: Normal rate, regular rhythm, normal heart sounds and intact distal pulses.   Respiratory: Effort normal and breath sounds normal. No stridor. No respiratory distress. He has no wheezes.  GI: Soft. There is no tenderness. There is no guarding.  Musculoskeletal:       Right knee: He exhibits decreased range of motion and bony tenderness. He exhibits no ecchymosis, no deformity, no laceration, no erythema and normal alignment. Tenderness found.  Lymphadenopathy:    He has no cervical adenopathy.  Neurological: He is alert and oriented to person, place, and time.  Skin: Skin is warm and dry.  Psychiatric: He has a normal mood and affect.     Labs:  Estimated body mass index is 30.56 kg/(m^2) as calculated from the following:   Height as of 08/09/15: 5\' 11"  (1.803 m).   Weight as of 08/09/15: 99.338 kg (219 lb).   Imaging Review Plain radiographs demonstrate severe degenerative joint disease of the right knee(s). The overall alignment is neutral. The bone quality appears to be good for age and reported activity level.  Assessment/Plan:  End stage arthritis, right knee   The patient history, physical examination, clinical judgment of the provider and imaging studies are consistent with end stage degenerative joint disease of the right knee(s) and total knee arthroplasty is deemed medically necessary. The treatment options including medical management, injection therapy arthroscopy and arthroplasty were discussed at length. The risks and benefits of total knee arthroplasty were presented and reviewed. The risks due to aseptic loosening, infection, stiffness, patella tracking problems, thromboembolic complications and other imponderables were discussed. The patient acknowledged the explanation, agreed to proceed with the plan and consent was signed. Patient is being  admitted for inpatient treatment for surgery, pain control, PT, OT, prophylactic antibiotics, VTE prophylaxis, progressive ambulation and ADL's and discharge planning. The patient is planning to be discharged home with home health services vs skilled nursing facility .      [place] Pugh Jovan Colligan   PA-C  08/20/2015, 10:39 AM

## 2015-08-26 NOTE — Patient Instructions (Addendum)
LE CLOUGHERTY  08/26/2015   Your procedure is scheduled on:   09/06/2015    Report to [facility] Main  Entrance take [facility]  elevators to 3rd floor to  [place] at     12noon  Call this number if you have problems the morning of surgery [phone]   Remember: ONLY 1 PERSON MAY GO WITH YOU TO SHORT STAY TO GET  READY MORNING OF YOUR SURGERY.  Do not eat food after midnite.  May have clear liquids from 12 midnite until 0800am morning of surgery then nothing by mouth      Take these medicines the morning of surgery with A SIP OF WATER:   Albuterol Inhaler if needed and bring, Amlodipine ( Norvasc), Dexilant, Flonase, Hydrocodone if needed or hydromorphone if needed , Spiriva                                 You may not have any metal on your body including hair pins and              piercings  Do not wear jewelry, , lotions, powders or perfumes, deodorant                         Men may shave face and neck.   Do not bring valuables to the hospital. [place].  Contacts, dentures or bridgework may not be worn into surgery.  Leave suitcase in the car. After surgery it may be brought to your room.     Special Instructions: coughing and deep breathing exercises, leg exercises               Please read over the following fact sheets you were given: _____________________________________________________________________             [facility] - Preparing for Surgery Before surgery, you can play an important role.  Because skin is not sterile, your skin needs to be as free of germs as possible.  You can reduce the number of germs on your skin by washing with CHG (chlorahexidine gluconate) soap before surgery.  CHG is an antiseptic cleaner which kills germs and bonds with the skin to continue killing germs even after washing. Please DO NOT use if you have an allergy to CHG or antibacterial soaps.  If your  skin becomes reddened/irritated stop using the CHG and inform your nurse when you arrive at Short Stay. Do not shave (including legs and underarms) for at least 48 hours prior to the first CHG shower.  You may shave your face/neck. Please follow these instructions carefully:  1.  Shower with CHG Soap the night before surgery and the  morning of Surgery.  2.  If you choose to wash your hair, wash your hair first as usual with your  normal  shampoo.  3.  After you shampoo, rinse your hair and body thoroughly to remove the  shampoo.                           4.  Use CHG as you would any other liquid soap.  You can apply chg directly  to the skin  and wash                       Gently with a scrungie or clean washcloth.  5.  Apply the CHG Soap to your body ONLY FROM THE NECK DOWN.   Do not use on face/ open                           Wound or open sores. Avoid contact with eyes, ears mouth and genitals (private parts).                       Wash face,  Genitals (private parts) with your normal soap.             6.  Wash thoroughly, paying special attention to the area where your surgery  will be performed.  7.  Thoroughly rinse your body with warm water from the neck down.  8.  DO NOT shower/wash with your normal soap after using and rinsing off  the CHG Soap.                9.  Pat yourself dry with a clean towel.            10.  Wear clean pajamas.            11.  Place clean sheets on your bed the night of your first shower and do not  sleep with pets. Day of Surgery : Do not apply any lotions/deodorants the morning of surgery.  Please wear clean clothes to the hospital/surgery center.  FAILURE TO FOLLOW THESE INSTRUCTIONS MAY RESULT IN THE CANCELLATION OF YOUR SURGERY PATIENT SIGNATURE_________________________________  NURSE SIGNATURE__________________________________  ________________________________________________________________________    CLEAR LIQUID DIET   Foods Allowed                                                                      Foods Excluded  Coffee and tea, regular and decaf                             liquids that you cannot  [place] Jell-O in any flavor                                             see through such as: Fruit ices (not with fruit pulp)                                     milk, soups, orange juice  Iced Popsicles                                    All solid food Carbonated beverages, regular and diet  Cranberry, grape and apple juices Sports drinks like Gatorade Lightly seasoned clear broth or consume(fat free) Sugar, honey syrup  Sample Menu Breakfast                                Lunch                                     Supper Cranberry juice                    Beef broth                            Chicken broth Jell-O                                     Grape juice                           Apple juice Coffee or tea                        Jell-O                                      Popsicle                                                Coffee or tea                        Coffee or tea  _____________________________________________________________________   WHAT IS A BLOOD TRANSFUSION? Blood Transfusion Information  A transfusion is the replacement of blood or some of its parts. Blood is made up of multiple cells which provide different functions.  Red blood cells carry oxygen and are used for blood loss replacement.  White blood cells fight against infection.  Platelets control bleeding.  Plasma helps clot blood.  Other blood products are available for specialized needs, such as hemophilia or other clotting disorders. BEFORE THE TRANSFUSION  Who gives blood for transfusions?   Healthy volunteers who are fully evaluated to make sure their blood is safe. This is blood bank blood. Transfusion therapy is the safest it has ever been in the practice of medicine. Before blood is taken from a  donor, a complete history is taken to make sure that person has no history of diseases nor engages in risky social behavior (examples are intravenous drug use or sexual activity with multiple partners). The donor's travel history is screened to minimize risk of transmitting infections, such as malaria. The donated blood is tested for signs of infectious diseases, such as HIV and hepatitis. The blood is then tested to be sure it is compatible with you in order to minimize the chance of a transfusion reaction. If you or a relative donates blood, this is often done in anticipation of surgery and is not appropriate for emergency situations. It takes many days to process the donated blood.  RISKS AND COMPLICATIONS Although transfusion therapy is very safe and saves many lives, the main dangers of transfusion include:  1. Getting an infectious disease. 2. Developing a transfusion reaction. This is an allergic reaction to something in the blood you were given. Every precaution is taken to prevent this. The decision to have a blood transfusion has been considered carefully by your caregiver before blood is given. Blood is not given unless the benefits outweigh the risks. AFTER THE TRANSFUSION  Right after receiving a blood transfusion, you will usually feel much better and more energetic. This is especially true if your red blood cells have gotten low (anemic). The transfusion raises the level of the red blood cells which carry oxygen, and this usually causes an energy increase.  The nurse administering the transfusion will monitor you carefully for complications. HOME CARE INSTRUCTIONS  No special instructions are needed after a transfusion. You may find your energy is better. Speak with your caregiver about any limitations on activity for underlying diseases you may have. SEEK MEDICAL CARE IF:   Your condition is not improving after your transfusion.  You develop redness or irritation at the intravenous  (IV) site. SEEK IMMEDIATE MEDICAL CARE IF:  Any of the following symptoms occur over the next 12 hours:  Shaking chills.  You have a temperature by mouth above 102 F (38.9 C), not controlled by medicine.  Chest, back, or muscle pain.  People around you feel you are not acting correctly or are confused.  Shortness of breath or difficulty breathing.  Dizziness and fainting.  You get a rash or develop hives.  You have a decrease in urine output.  Your urine turns a dark color or changes to pink, red, or brown. Any of the following symptoms occur over the next 10 days:  You have a temperature by mouth above 102 F (38.9 C), not controlled by medicine.  Shortness of breath.  Weakness after normal activity.  The white part of the eye turns yellow (jaundice).  You have a decrease in the amount of urine or are urinating less often.  Your urine turns a dark color or changes to pink, red, or brown. Document Released: 09/01/2000 Document Revised: 11/27/2011 Document Reviewed: 04/20/2008 ExitCare Patient Information 2014 [place].  _______________________________________________________________________  Incentive Spirometer  An incentive spirometer is a tool that can help keep your lungs clear and active. This tool measures how well you are filling your lungs with each breath. Taking long deep breaths may help reverse or decrease the chance of developing breathing (pulmonary) problems (especially infection) following:  A long period of time when you are unable to move or be active. BEFORE THE PROCEDURE   If the spirometer includes an indicator to show your best effort, your nurse or respiratory therapist will set it to a desired goal.  If possible, sit up straight or lean slightly forward. Try not to slouch.  Hold the incentive spirometer in an upright position. INSTRUCTIONS FOR USE  3. Sit on the edge of your bed if possible, or sit up as far as you can in bed or on  a chair. 4. Hold the incentive spirometer in an upright position. 5. Breathe out normally. 6. Place the mouthpiece in your mouth and seal your lips tightly around it. 7. Breathe in slowly and as deeply as possible, raising the piston or the ball toward the top of the column. 8. Hold your breath for 3-5 seconds or for as long as possible. Allow the piston  or ball to fall to the bottom of the column. 9. Remove the mouthpiece from your mouth and breathe out normally. 10. Rest for a few seconds and repeat Steps 1 through 7 at least 10 times every 1-2 hours when you are awake. Take your time and take a few normal breaths between deep breaths. 11. The spirometer may include an indicator to show your best effort. Use the indicator as a goal to work toward during each repetition. 12. After each set of 10 deep breaths, practice coughing to be sure your lungs are clear. If you have an incision (the cut made at the time of surgery), support your incision when coughing by placing a pillow or rolled up towels firmly against it. Once you are able to get out of bed, walk around indoors and cough well. You may stop using the incentive spirometer when instructed by your caregiver.  RISKS AND COMPLICATIONS  Take your time so you do not get dizzy or light-headed.  If you are in pain, you may need to take or ask for pain medication before doing incentive spirometry. It is harder to take a deep breath if you are having pain. AFTER USE  Rest and breathe slowly and easily.  It can be helpful to keep track of a log of your progress. Your caregiver can provide you with a simple table to help with this. If you are using the spirometer at home, follow these instructions: [place] IF:   You are having difficultly using the spirometer.  You have trouble using the spirometer as often as instructed.  Your pain medication is not giving enough relief while using the spirometer.  You develop fever of 100.5 F  (38.1 C) or higher. SEEK IMMEDIATE MEDICAL CARE IF:   You cough up bloody sputum that had not been present before.  You develop fever of 102 F (38.9 C) or greater.  You develop worsening pain at or near the incision site. MAKE SURE YOU:   Understand these instructions.  Will watch your condition.  Will get help right away if you are not doing well or get worse. Document Released: 01/15/2007 Document Revised: 11/27/2011 Document Reviewed: 03/18/2007 [facility] Patient Information 2014 [place], [place].   ________________________________________________________________________

## 2015-08-27 ENCOUNTER — Encounter (HOSPITAL_COMMUNITY): Payer: Self-pay

## 2015-08-27 ENCOUNTER — Encounter (HOSPITAL_COMMUNITY)
Admission: RE | Admit: 2015-08-27 | Discharge: 2015-08-27 | Disposition: A | Discharge status: Home / Self Care | Payer: Managed Care, Other (non HMO) | Source: Ambulatory Visit | Attending: Orthopedic Surgery | Admitting: Orthopedic Surgery

## 2015-08-27 ENCOUNTER — Encounter (INDEPENDENT_AMBULATORY_CARE_PROVIDER_SITE_OTHER): Payer: Self-pay

## 2015-08-27 DIAGNOSIS — Z01812 Encounter for preprocedural laboratory examination: Secondary | ICD-10-CM | POA: Insufficient documentation

## 2015-08-27 HISTORY — DX: Reserved for inherently not codable concepts without codable children: IMO0001

## 2015-08-27 LAB — SURGICAL PCR SCREEN
MRSA, PCR: NEGATIVE
Staphylococcus aureus: NEGATIVE

## 2015-08-27 LAB — URINALYSIS, ROUTINE W REFLEX MICROSCOPIC
BILIRUBIN URINE: NEGATIVE
Glucose, UA: NEGATIVE mg/dL
HGB URINE DIPSTICK: NEGATIVE
Ketones, ur: NEGATIVE mg/dL
Leukocytes, UA: NEGATIVE
Nitrite: NEGATIVE
PH: 5.5 (ref 5.0–8.0)
Protein, ur: NEGATIVE mg/dL
SPECIFIC GRAVITY, URINE: 1.021 (ref 1.005–1.030)

## 2015-08-27 LAB — CBC
HCT: 48.9 % (ref 39.0–52.0)
Hemoglobin: 16.4 g/dL (ref 13.0–17.0)
MCH: 30.6 pg (ref 26.0–34.0)
MCHC: 33.5 g/dL (ref 30.0–36.0)
MCV: 91.2 fL (ref 78.0–100.0)
PLATELETS: 221 10*3/uL (ref 150–400)
RBC: 5.36 MIL/uL (ref 4.22–5.81)
RDW: 12.9 % (ref 11.5–15.5)
WBC: 5.7 10*3/uL (ref 4.0–10.5)

## 2015-08-27 LAB — BASIC METABOLIC PANEL
Anion gap: 5 (ref 5–15)
BUN: 13 mg/dL (ref 6–20)
CALCIUM: 9.5 mg/dL (ref 8.9–10.3)
CHLORIDE: 106 mmol/L (ref 101–111)
CO2: 28 mmol/L (ref 22–32)
CREATININE: 0.88 mg/dL (ref 0.61–1.24)
GFR calc non Af Amer: >60 mL/min (ref >60)
Glucose, Bld: 96 mg/dL (ref 65–99)
Potassium: 4.1 mmol/L (ref 3.5–5.1)
SODIUM: 139 mmol/L (ref 135–145)

## 2015-08-27 LAB — ABO/RH: ABO/RH(D): O POS

## 2015-08-27 LAB — PROTIME-INR
INR: 1.14 (ref 0.00–1.49)
PROTHROMBIN TIME: 14.7 s (ref 11.6–15.2)

## 2015-08-27 LAB — APTT: aPTT: 31 seconds (ref 24–37)

## 2015-08-27 NOTE — Progress Notes (Signed)
Clearance- Dr Elder Cyphers- 08/03/15 on chart  08/09/2015- DR Byrum clearance on chart  EKG- 08/03/2015 EPIC  PFT- 03/23/14 EPIC  07/12/2015- CT CHEST- EPIC  LOv with pulmonary 08/09/15- EPIC

## 2015-09-05 MED ORDER — VANCOMYCIN HCL 10 G IV SOLR
1500.0000 mg | INTRAVENOUS | Status: AC
  Administered 2015-09-06 (×2): 1500 mg via INTRAVENOUS
  Filled 2015-09-05 (×2): qty 1500

## 2015-09-06 ENCOUNTER — Inpatient Hospital Stay (HOSPITAL_COMMUNITY): Payer: Managed Care, Other (non HMO) | Admitting: Anesthesiology

## 2015-09-06 ENCOUNTER — Encounter (HOSPITAL_COMMUNITY): Payer: Self-pay | Admitting: *Deleted

## 2015-09-06 ENCOUNTER — Encounter (HOSPITAL_COMMUNITY)
Admission: RE | Disposition: A | Discharge status: Home Health | Payer: Self-pay | Source: Ambulatory Visit | Attending: Orthopedic Surgery

## 2015-09-06 ENCOUNTER — Inpatient Hospital Stay (HOSPITAL_COMMUNITY)
Admission: RE | Admit: 2015-09-06 | Discharge: 2015-09-08 | DRG: 470 | Disposition: A | Discharge status: Home Health | Payer: Managed Care, Other (non HMO) | Source: Ambulatory Visit | Attending: Orthopedic Surgery | Admitting: Orthopedic Surgery

## 2015-09-06 DIAGNOSIS — I10 Essential (primary) hypertension: Secondary | ICD-10-CM | POA: Diagnosis present

## 2015-09-06 DIAGNOSIS — M1711 Unilateral primary osteoarthritis, right knee: Principal | ICD-10-CM | POA: Diagnosis present

## 2015-09-06 DIAGNOSIS — Z01812 Encounter for preprocedural laboratory examination: Secondary | ICD-10-CM

## 2015-09-06 DIAGNOSIS — K219 Gastro-esophageal reflux disease without esophagitis: Secondary | ICD-10-CM | POA: Diagnosis present

## 2015-09-06 DIAGNOSIS — M659 Synovitis and tenosynovitis, unspecified: Secondary | ICD-10-CM | POA: Diagnosis present

## 2015-09-06 DIAGNOSIS — Z6830 Body mass index (BMI) 30.0-30.9, adult: Secondary | ICD-10-CM

## 2015-09-06 DIAGNOSIS — Z96651 Presence of right artificial knee joint: Secondary | ICD-10-CM

## 2015-09-06 DIAGNOSIS — Z96659 Presence of unspecified artificial knee joint: Secondary | ICD-10-CM

## 2015-09-06 DIAGNOSIS — J449 Chronic obstructive pulmonary disease, unspecified: Secondary | ICD-10-CM | POA: Diagnosis present

## 2015-09-06 DIAGNOSIS — E669 Obesity, unspecified: Secondary | ICD-10-CM | POA: Diagnosis present

## 2015-09-06 DIAGNOSIS — Z87891 Personal history of nicotine dependence: Secondary | ICD-10-CM

## 2015-09-06 DIAGNOSIS — M25561 Pain in right knee: Secondary | ICD-10-CM | POA: Diagnosis present

## 2015-09-06 HISTORY — PX: TOTAL KNEE ARTHROPLASTY: SHX125

## 2015-09-06 LAB — TYPE AND SCREEN
ABO/RH(D): O POS
ANTIBODY SCREEN: NEGATIVE

## 2015-09-06 SURGERY — ARTHROPLASTY, KNEE, TOTAL
Anesthesia: Spinal | Site: Knee | Laterality: Right

## 2015-09-06 MED ORDER — 0.9 % SODIUM CHLORIDE (POUR BTL) OPTIME
TOPICAL | Status: DC | PRN
  Administered 2015-09-06: 1000 mL

## 2015-09-06 MED ORDER — PROPOFOL 500 MG/50ML IV EMUL
INTRAVENOUS | Status: DC | PRN
  Administered 2015-09-06: 30 mg via INTRAVENOUS

## 2015-09-06 MED ORDER — CHLORHEXIDINE GLUCONATE 4 % EX LIQD: 60.0000 mL | Freq: Once | CUTANEOUS | Status: DC

## 2015-09-06 MED ORDER — BISACODYL 10 MG RE SUPP: 10.0000 mg | Freq: Every day | RECTAL | Status: DC | PRN

## 2015-09-06 MED ORDER — PHENOL 1.4 % MT LIQD
1.0000 | OROMUCOSAL | Status: DC | PRN
  Filled 2015-09-06: qty 177

## 2015-09-06 MED ORDER — FERROUS SULFATE 325 (65 FE) MG PO TABS
325.0000 mg | ORAL_TABLET | Freq: Three times a day (TID) | ORAL | Status: DC
  Administered 2015-09-06 – 2015-09-07 (×4): 325 mg via ORAL
  Filled 2015-09-06 (×8): qty 1

## 2015-09-06 MED ORDER — HYDROMORPHONE HCL 1 MG/ML IJ SOLN
0.5000 mg | INTRAMUSCULAR | Status: DC | PRN
  Administered 2015-09-06 (×2): 0.5 mg via INTRAVENOUS

## 2015-09-06 MED ORDER — BUPIVACAINE-EPINEPHRINE (PF) 0.25% -1:200000 IJ SOLN
INTRAMUSCULAR | Status: DC | PRN
  Administered 2015-09-06: 30 mL

## 2015-09-06 MED ORDER — ONDANSETRON HCL 4 MG/2ML IJ SOLN: 4.0000 mg | Freq: Once | INTRAMUSCULAR | Status: DC | PRN

## 2015-09-06 MED ORDER — FLUTICASONE PROPIONATE 50 MCG/ACT NA SUSP
2.0000 | Freq: Every day | NASAL | Status: DC
  Administered 2015-09-07 – 2015-09-08 (×2): 2 via NASAL
  Filled 2015-09-06: qty 16

## 2015-09-06 MED ORDER — HYDROCODONE-ACETAMINOPHEN 7.5-325 MG PO TABS
1.0000 | ORAL_TABLET | ORAL | Status: DC
  Administered 2015-09-06 (×2): 1 via ORAL
  Administered 2015-09-06 – 2015-09-07 (×4): 2 via ORAL
  Administered 2015-09-07: 1 via ORAL
  Administered 2015-09-07 – 2015-09-08 (×3): 2 via ORAL
  Filled 2015-09-06 (×4): qty 2
  Filled 2015-09-06 (×2): qty 1
  Filled 2015-09-06 (×4): qty 2

## 2015-09-06 MED ORDER — ASPIRIN EC 325 MG PO TBEC
325.0000 mg | DELAYED_RELEASE_TABLET | Freq: Two times a day (BID) | ORAL | Status: DC
  Administered 2015-09-07 – 2015-09-08 (×3): 325 mg via ORAL
  Filled 2015-09-06 (×5): qty 1

## 2015-09-06 MED ORDER — METHOCARBAMOL 500 MG PO TABS
500.0000 mg | ORAL_TABLET | Freq: Four times a day (QID) | ORAL | Status: DC | PRN
Start: 2015-09-06 — End: 2015-09-08
  Administered 2015-09-06 – 2015-09-08 (×3): 500 mg via ORAL
  Filled 2015-09-06 (×3): qty 1

## 2015-09-06 MED ORDER — SODIUM CHLORIDE 0.9 % IJ SOLN
INTRAMUSCULAR | Status: DC | PRN
  Administered 2015-09-06: 50 mL

## 2015-09-06 MED ORDER — PANTOPRAZOLE SODIUM 40 MG PO TBEC
40.0000 mg | DELAYED_RELEASE_TABLET | Freq: Every day | ORAL | Status: DC
  Administered 2015-09-07 – 2015-09-08 (×2): 40 mg via ORAL
  Filled 2015-09-06 (×3): qty 1

## 2015-09-06 MED ORDER — TIOTROPIUM BROMIDE MONOHYDRATE 18 MCG IN CAPS
18.0000 ug | ORAL_CAPSULE | Freq: Every day | RESPIRATORY_TRACT | Status: DC
  Filled 2015-09-06: qty 5

## 2015-09-06 MED ORDER — ONDANSETRON HCL 4 MG/2ML IJ SOLN: 4.0000 mg | Freq: Four times a day (QID) | INTRAMUSCULAR | Status: DC | PRN

## 2015-09-06 MED ORDER — SODIUM CHLORIDE 0.9 % IV SOLN
1000.0000 mg | Freq: Once | INTRAVENOUS | Status: AC
  Administered 2015-09-06: 1000 mg via INTRAVENOUS
  Filled 2015-09-06: qty 10

## 2015-09-06 MED ORDER — DEXAMETHASONE SODIUM PHOSPHATE 10 MG/ML IJ SOLN
10.0000 mg | Freq: Once | INTRAMUSCULAR | Status: AC
  Administered 2015-09-06: 10 mg via INTRAVENOUS

## 2015-09-06 MED ORDER — BUPIVACAINE HCL (PF) 0.75 % IJ SOLN
INTRAMUSCULAR | Status: DC | PRN
Start: 2015-09-06 — End: 2015-09-06
  Administered 2015-09-06: 1.8 mL via INTRATHECAL

## 2015-09-06 MED ORDER — CLOBETASOL PROPIONATE 0.05 % EX OINT
1.0000 "application " | TOPICAL_OINTMENT | Freq: Two times a day (BID) | CUTANEOUS | Status: DC | PRN
  Filled 2015-09-06: qty 15

## 2015-09-06 MED ORDER — KETOROLAC TROMETHAMINE 30 MG/ML IJ SOLN
INTRAMUSCULAR | Status: AC
  Filled 2015-09-06: qty 1

## 2015-09-06 MED ORDER — DEXAMETHASONE SODIUM PHOSPHATE 10 MG/ML IJ SOLN
10.0000 mg | Freq: Once | INTRAMUSCULAR | Status: AC
  Administered 2015-09-07: 10 mg via INTRAVENOUS
  Filled 2015-09-06: qty 1

## 2015-09-06 MED ORDER — MENTHOL 3 MG MT LOZG: 1.0000 | LOZENGE | OROMUCOSAL | Status: DC | PRN

## 2015-09-06 MED ORDER — HYDROMORPHONE HCL 1 MG/ML IJ SOLN
0.5000 mg | INTRAMUSCULAR | Status: DC | PRN
  Administered 2015-09-06 (×3): 0.5 mg via INTRAVENOUS
  Administered 2015-09-07 (×3): 1 mg via INTRAVENOUS
  Filled 2015-09-06 (×5): qty 1

## 2015-09-06 MED ORDER — ALBUTEROL SULFATE (2.5 MG/3ML) 0.083% IN NEBU: 3.0000 mL | INHALATION_SOLUTION | RESPIRATORY_TRACT | Status: DC | PRN

## 2015-09-06 MED ORDER — DIPHENHYDRAMINE HCL 25 MG PO CAPS: 25.0000 mg | ORAL_CAPSULE | Freq: Four times a day (QID) | ORAL | Status: DC | PRN

## 2015-09-06 MED ORDER — DOCUSATE SODIUM 100 MG PO CAPS
100.0000 mg | ORAL_CAPSULE | Freq: Two times a day (BID) | ORAL | Status: DC
  Administered 2015-09-07 – 2015-09-08 (×3): 100 mg via ORAL

## 2015-09-06 MED ORDER — FENTANYL CITRATE (PF) 100 MCG/2ML IJ SOLN
INTRAMUSCULAR | Status: AC
  Filled 2015-09-06: qty 2

## 2015-09-06 MED ORDER — METOCLOPRAMIDE HCL 5 MG/ML IJ SOLN
5.0000 mg | Freq: Three times a day (TID) | INTRAMUSCULAR | Status: DC | PRN
Start: 2015-09-06 — End: 2015-09-08

## 2015-09-06 MED ORDER — CELECOXIB 200 MG PO CAPS
200.0000 mg | ORAL_CAPSULE | Freq: Two times a day (BID) | ORAL | Status: DC
  Administered 2015-09-06 – 2015-09-08 (×4): 200 mg via ORAL
  Filled 2015-09-06 (×7): qty 1

## 2015-09-06 MED ORDER — MIDAZOLAM HCL 2 MG/2ML IJ SOLN
INTRAMUSCULAR | Status: AC
  Filled 2015-09-06: qty 2

## 2015-09-06 MED ORDER — VANCOMYCIN HCL IN DEXTROSE 1-5 GM/200ML-% IV SOLN
1000.0000 mg | Freq: Two times a day (BID) | INTRAVENOUS | Status: AC
  Administered 2015-09-06: 1000 mg via INTRAVENOUS
  Filled 2015-09-06: qty 200

## 2015-09-06 MED ORDER — DEXTROSE 5 % IV SOLN
500.0000 mg | Freq: Four times a day (QID) | INTRAVENOUS | Status: DC | PRN
  Administered 2015-09-06: 500 mg via INTRAVENOUS
  Filled 2015-09-06 (×2): qty 5

## 2015-09-06 MED ORDER — FENTANYL CITRATE (PF) 100 MCG/2ML IJ SOLN
INTRAMUSCULAR | Status: DC | PRN
  Administered 2015-09-06: 100 ug via INTRAVENOUS

## 2015-09-06 MED ORDER — ALUM & MAG HYDROXIDE-SIMETH 200-200-20 MG/5ML PO SUSP: 30.0000 mL | ORAL | Status: DC | PRN

## 2015-09-06 MED ORDER — POLYETHYLENE GLYCOL 3350 17 G PO PACK
17.0000 g | PACK | Freq: Two times a day (BID) | ORAL | Status: DC
  Administered 2015-09-07 – 2015-09-08 (×3): 17 g via ORAL

## 2015-09-06 MED ORDER — AMLODIPINE BESYLATE 5 MG PO TABS
5.0000 mg | ORAL_TABLET | Freq: Every day | ORAL | Status: DC
  Administered 2015-09-07 – 2015-09-08 (×2): 5 mg via ORAL
  Filled 2015-09-06 (×2): qty 1

## 2015-09-06 MED ORDER — BUPIVACAINE-EPINEPHRINE (PF) 0.25% -1:200000 IJ SOLN
INTRAMUSCULAR | Status: AC
  Filled 2015-09-06: qty 30

## 2015-09-06 MED ORDER — SODIUM CHLORIDE 0.9 % IJ SOLN
INTRAMUSCULAR | Status: AC
  Filled 2015-09-06: qty 50

## 2015-09-06 MED ORDER — PROPOFOL 500 MG/50ML IV EMUL
INTRAVENOUS | Status: DC | PRN
  Administered 2015-09-06: 75 ug/kg/min via INTRAVENOUS

## 2015-09-06 MED ORDER — SODIUM CHLORIDE 0.9 % IR SOLN
Status: DC | PRN
  Administered 2015-09-06: 1000 mL

## 2015-09-06 MED ORDER — MIDAZOLAM HCL 5 MG/5ML IJ SOLN
INTRAMUSCULAR | Status: DC | PRN
  Administered 2015-09-06: 2 mg via INTRAVENOUS

## 2015-09-06 MED ORDER — PROPOFOL 10 MG/ML IV BOLUS
INTRAVENOUS | Status: AC
  Filled 2015-09-06: qty 20

## 2015-09-06 MED ORDER — LACTATED RINGERS IV SOLN
INTRAVENOUS | Status: DC
  Administered 2015-09-06 (×2): via INTRAVENOUS

## 2015-09-06 MED ORDER — SODIUM CHLORIDE 0.9 % IV SOLN
INTRAVENOUS | Status: DC
  Administered 2015-09-06 – 2015-09-07 (×2): via INTRAVENOUS
  Filled 2015-09-06 (×6): qty 1000

## 2015-09-06 MED ORDER — PROPOFOL 10 MG/ML IV BOLUS
INTRAVENOUS | Status: AC
  Filled 2015-09-06: qty 40

## 2015-09-06 MED ORDER — METOCLOPRAMIDE HCL 10 MG PO TABS: 5.0000 mg | ORAL_TABLET | Freq: Three times a day (TID) | ORAL | Status: DC | PRN

## 2015-09-06 MED ORDER — HYDROMORPHONE HCL 1 MG/ML IJ SOLN
INTRAMUSCULAR | Status: AC
  Filled 2015-09-06: qty 1

## 2015-09-06 MED ORDER — KETOROLAC TROMETHAMINE 30 MG/ML IJ SOLN
INTRAMUSCULAR | Status: DC | PRN
  Administered 2015-09-06: 30 mg

## 2015-09-06 MED ORDER — ONDANSETRON HCL 4 MG PO TABS: 4.0000 mg | ORAL_TABLET | Freq: Four times a day (QID) | ORAL | Status: DC | PRN

## 2015-09-06 MED ORDER — MAGNESIUM CITRATE PO SOLN: 1.0000 | Freq: Once | ORAL | Status: DC | PRN

## 2015-09-06 SURGICAL SUPPLY — 46 items
BAG DECANTER FOR FLEXI CONT (MISCELLANEOUS) IMPLANT
BAG SPEC THK2 15X12 ZIP CLS (MISCELLANEOUS)
BAG ZIPLOCK 12X15 (MISCELLANEOUS) IMPLANT
BANDAGE ELASTIC 6 VELCRO ST LF (GAUZE/BANDAGES/DRESSINGS) ×3 IMPLANT
BLADE SAW SGTL 13.0X1.19X90.0M (BLADE) ×3 IMPLANT
BOWL SMART MIX CTS (DISPOSABLE) ×3 IMPLANT
CAPT KNEE TOTAL 3 ATTUNE ×2 IMPLANT
CEMENT HV SMART SET (Cement) ×4 IMPLANT
CLOTH BEACON ORANGE TIMEOUT ST (SAFETY) ×3 IMPLANT
CUFF TOURN SGL QUICK 34 (TOURNIQUET CUFF) ×3
CUFF TRNQT CYL 34X4X40X1 (TOURNIQUET CUFF) ×1 IMPLANT
DECANTER SPIKE VIAL GLASS SM (MISCELLANEOUS) ×3 IMPLANT
DRAPE U-SHAPE 47X51 STRL (DRAPES) ×3 IMPLANT
DRSG AQUACEL AG ADV 3.5X10 (GAUZE/BANDAGES/DRESSINGS) ×3 IMPLANT
DURAPREP 26ML APPLICATOR (WOUND CARE) ×6 IMPLANT
ELECT REM PT RETURN 9FT ADLT (ELECTROSURGICAL) ×3
ELECTRODE REM PT RTRN 9FT ADLT (ELECTROSURGICAL) ×1 IMPLANT
GLOVE BIOGEL M 7.0 STRL (GLOVE) IMPLANT
GLOVE BIOGEL PI IND STRL 7.5 (GLOVE) ×1 IMPLANT
GLOVE BIOGEL PI IND STRL 8.5 (GLOVE) ×1 IMPLANT
GLOVE BIOGEL PI INDICATOR 7.5 (GLOVE) ×2
GLOVE BIOGEL PI INDICATOR 8.5 (GLOVE) ×2
GLOVE ECLIPSE 8.0 STRL XLNG CF (GLOVE) ×3 IMPLANT
GLOVE ORTHO TXT STRL SZ7.5 (GLOVE) ×6 IMPLANT
GOWN STRL REUS W/TWL LRG LVL3 (GOWN DISPOSABLE) ×3 IMPLANT
GOWN STRL REUS W/TWL XL LVL3 (GOWN DISPOSABLE) ×3 IMPLANT
HANDPIECE INTERPULSE COAX TIP (DISPOSABLE) ×3
LIQUID BAND (GAUZE/BANDAGES/DRESSINGS) ×3 IMPLANT
MANIFOLD NEPTUNE II (INSTRUMENTS) ×3 IMPLANT
PACK TOTAL KNEE CUSTOM (KITS) ×3 IMPLANT
POSITIONER SURGICAL ARM (MISCELLANEOUS) ×3 IMPLANT
SET HNDPC FAN SPRY TIP SCT (DISPOSABLE) ×1 IMPLANT
SET PAD KNEE POSITIONER (MISCELLANEOUS) ×3 IMPLANT
SUCTION FRAZIER 12FR DISP (SUCTIONS) ×3 IMPLANT
SUT MNCRL AB 4-0 PS2 18 (SUTURE) ×3 IMPLANT
SUT STRATAFIX 0 PDS 27 VIOLET (SUTURE) ×3
SUT VIC AB 1 CT1 36 (SUTURE) ×3 IMPLANT
SUT VIC AB 2-0 CT1 27 (SUTURE) ×9
SUT VIC AB 2-0 CT1 TAPERPNT 27 (SUTURE) ×3 IMPLANT
SUT VLOC 180 0 24IN GS25 (SUTURE) ×1 IMPLANT
SUTURE STRATFX 0 PDS 27 VIOLET (SUTURE) IMPLANT
SYR 50ML LL SCALE MARK (SYRINGE) ×3 IMPLANT
TRAY FOLEY W/METER SILVER 16FR (SET/KITS/TRAYS/PACK) ×3 IMPLANT
WATER STERILE IRR 1500ML POUR (IV SOLUTION) ×3 IMPLANT
WRAP KNEE MAXI GEL POST OP (GAUZE/BANDAGES/DRESSINGS) ×3 IMPLANT
YANKAUER SUCT BULB TIP 10FT TU (MISCELLANEOUS) ×3 IMPLANT

## 2015-09-06 NOTE — Interval H&P Note (Signed)
History and Physical Interval Note:  09/06/2015 11:13 AM  [name]  has presented today for surgery, with the diagnosis of RIGHT KNEE OSTEOARTHRITIS  The various methods of treatment have been discussed with the patient and family. After consideration of risks, benefits and other options for treatment, the patient has consented to  Procedure(s): RIGHT TOTAL KNEE ARTHROPLASTY (Right) as a surgical intervention .  The patient's history has been reviewed, patient examined, no change in status, stable for surgery.  I have reviewed the patient's chart and labs.  Questions were answered to the patient's satisfaction.     Mauri Pole

## 2015-09-06 NOTE — Anesthesia Procedure Notes (Signed)
Spinal  Start time: 09/06/2015 12:40 PM End time: 09/06/2015 12:44 PM Staffing Resident/CRNA: Herbie Drape J Performed by: resident/CRNA  Preanesthetic Checklist Completed: patient identified, site marked, surgical consent, pre-op evaluation, timeout performed, IV checked, risks and benefits discussed and monitors and equipment checked Spinal Block Patient position: sitting Prep: Betadine Patient monitoring: heart rate, continuous pulse ox and blood pressure Approach: midline Location: L3-4 Injection technique: single-shot Needle Needle type: Sprotte  Needle gauge: 24 G Needle length: 9 cm Needle insertion depth: 7 cm

## 2015-09-06 NOTE — Discharge Instructions (Signed)

## 2015-09-06 NOTE — Op Note (Signed)
NAME:  [name]                      MEDICAL RECORD NO.:  [id number]                             FACILITY:  [facility]      PHYSICIAN:  Pietro Cassis. Alvan Dame, M.D.  DATE OF BIRTH:  22-Jul-1951      DATE OF PROCEDURE:  09/06/2015                                     OPERATIVE REPORT         PREOPERATIVE DIAGNOSIS:  Right knee osteoarthritis.      POSTOPERATIVE DIAGNOSIS:  Right knee osteoarthritis.      FINDINGS:  The patient was noted to have complete loss of cartilage and   bone-on-bone arthritis with associated osteophytes in all three compartments of   the knee with a significant synovitis and associated effusion.      PROCEDURE:  Right total knee replacement.      COMPONENTS USED:  DePuy Attune rotating platform posterior stabilized knee   system, a size 8 femur, 7 tibia, size 6 mm PS AOX insert, and 38 anatomic patellar   button.      SURGEON:  Pietro Cassis. Alvan Dame, M.D.      ASSISTANT:  Danae Orleans, PA-C.      ANESTHESIA:  Spinal.      SPECIMENS:  None.      COMPLICATION:  None.      DRAINS:  None  EBL: <100cc      TOURNIQUET TIME:   Total Tourniquet Time Documented: Thigh (Right) - 36 minutes Total: Thigh (Right) - 36 minutes  .      The patient was stable to the recovery room.      INDICATION FOR PROCEDURE:  [name] is a 64 y.o. male patient of   mine.  The patient had been seen, evaluated, and treated conservatively in the   office with medication, activity modification, and injections.  The patient had   radiographic changes of bone-on-bone arthritis with endplate sclerosis and osteophytes noted.      The patient failed conservative measures including medication, injections, and activity modification, and at this point was ready for more definitive measures.   Based on the radiographic changes and failed conservative measures, the patient   decided to proceed with total knee replacement.  Risks of infection,   DVT, component failure, need for  revision surgery, postop course, and   expectations were all   discussed and reviewed.  Consent was obtained for benefit of pain   relief.      PROCEDURE IN DETAIL:  The patient was brought to the operative theater.   Once adequate anesthesia, preoperative antibiotics, 1 gm of Vancmycin, 1 gm of Tranexamic Acid, and 10 mg of Decadron administered, the patient was positioned supine with the right thigh tourniquet placed.  The  right lower extremity was prepped and draped in sterile fashion.  A time-   out was performed identifying the patient, planned procedure, and   extremity.      The right lower extremity was placed in the [facility] leg holder.  The leg was   exsanguinated, tourniquet elevated to 250 mmHg.  A midline incision was   made followed by  median parapatellar arthrotomy.  Following initial   exposure, attention was first directed to the patella.  Precut   measurement was noted to be 27 mm.  I resected down to 15 mm and used a   38 anatomic patellar button to restore patellar height as well as cover the cut   surface.      The lug holes were drilled and a metal shim was placed to protect the   patella from retractors and saw blades.      At this point, attention was now directed to the femur.  The femoral   canal was opened with a drill, irrigated to try to prevent fat emboli.  An   intramedullary rod was passed at 5 degrees valgus, 9 mm of bone was   resected off the distal femur.  Following this resection, the tibia was   subluxated anteriorly.  Using the extramedullary guide, 2 mm of bone was resected off   the proximal medial tibia.  We confirmed the gap would be   stable medially and laterally with a size 5 mm insert as well as confirmed   the cut was perpendicular in the coronal plane, checking with an alignment rod.      Once this was done, I sized the femur to be a size 8 in the anterior-   posterior dimension, chose a standard component based on medial and   lateral  dimension.  The size 8 rotation block was then pinned in   position anterior referenced using the C-clamp to set rotation.  The   anterior, posterior, and  chamfer cuts were made without difficulty nor   notching making certain that I was along the anterior cortex to help   with flexion gap stability.      The final box cut was made off the lateral aspect of distal femur.      At this point, the tibia was sized to be a size 7, the size 7 tray was   then pinned in position through the medial third of the tubercle,   drilled, and keel punched.  Trial reduction was now carried with a 8 femur,  7 tibia, a size size 5 then 6 mm insert, and the 38 patella botton.  The knee was brought to   extension, full extension with good flexion stability with the patella   tracking through the trochlea without application of pressure.  Femoral lug holes were drilled.  Given   all these findings, the trial components removed.  Final components were   opened and cement was mixed.  The knee was irrigated with normal saline   solution and pulse lavage.  The synovial lining was   then injected with 30 cc of 0.25% Marcaine with epinephrine and 1 cc of Toradol plus 30cc of NS for a total of 61 cc.      The knee was irrigated.  Final implants were then cemented onto clean and   dried cut surfaces of bone with the knee brought to extension with a size 6 mm trial insert.      Once the cement had fully cured, the excess cement was removed   throughout the knee.  I confirmed I was satisfied with the range of   motion and stability, and the final size 6 mm PS AOX insert was chosen.  It was   placed into the knee.      The tourniquet had been let down at 36 minutes.  No significant  hemostasis required.  The   extensor mechanism was then reapproximated using #1 Vicryl and #0 Quill with the knee   in flexion.  The   remaining wound was closed with 2-0 Vicryl and running 4-0 Monocryl.   The knee was cleaned, dried,  dressed sterilely using Dermabond and   Aquacel dressing.  The patient was then   brought to recovery room in stable condition, tolerating the procedure   well.   Please note that Physician Assistant, Danae Orleans, PA-C, was present for the entirety of the case, and was utilized for pre-operative positioning, peri-operative retractor management, general facilitation of the procedure.  He was also utilized for primary wound closure at the end of the case.              Pietro Cassis Alvan Dame, M.D.    09/06/2015 2:42 PM

## 2015-09-06 NOTE — Transfer of Care (Signed)
Immediate Anesthesia Transfer of Care Note  Patient: [name]  Procedure(s) Performed: Procedure(s): RIGHT TOTAL KNEE ARTHROPLASTY (Right)  Patient Location: PACU  Anesthesia Type:Spinal  Level of Consciousness: sedated, patient cooperative and responds to stimulation  Airway & Oxygen Therapy: Patient Spontanous Breathing and Patient connected to face mask oxygen  Post-op Assessment: Report given to RN and Post -op Vital signs reviewed and stable  Post vital signs: Reviewed and stable  Last Vitals:  Filed Vitals:   09/06/15 0948  BP: 151/96  Pulse: 90  Temp: 36.6 C  Resp: 18    Complications: No apparent anesthesia complications

## 2015-09-06 NOTE — Anesthesia Preprocedure Evaluation (Signed)
Anesthesia Evaluation  Patient identified by MRN, date of birth, ID band Patient awake    Reviewed: Allergy & Precautions, NPO status , Patient's Chart, lab work & pertinent test results  Airway Mallampati: I       Dental   Pulmonary COPD, former smoker,    Pulmonary exam normal        Cardiovascular hypertension, Normal cardiovascular exam     Neuro/Psych    GI/Hepatic GERD  ,  Endo/Other    Renal/GU      Musculoskeletal  (+) Arthritis ,   Abdominal   Peds  Hematology   Anesthesia Other Findings   Reproductive/Obstetrics                             Anesthesia Physical Anesthesia Plan  ASA: II  Anesthesia Plan: Spinal   Post-op Pain Management:    Induction: Intravenous  Airway Management Planned: Mask  Additional Equipment:   Intra-op Plan:   Post-operative Plan:   Informed Consent: I have reviewed the patients History and Physical, chart, labs and discussed the procedure including the risks, benefits and alternatives for the proposed anesthesia with the patient or authorized representative who has indicated his/her understanding and acceptance.     Plan Discussed with: CRNA, Anesthesiologist and Surgeon  Anesthesia Plan Comments:         Anesthesia Quick Evaluation

## 2015-09-06 NOTE — Anesthesia Postprocedure Evaluation (Signed)
Anesthesia Post Note  Patient: [name]  Procedure(s) Performed: Procedure(s) (LRB): RIGHT TOTAL KNEE ARTHROPLASTY (Right)  Patient location during evaluation: PACU Anesthesia Type: Spinal Level of consciousness: awake, awake and alert, oriented and patient cooperative Pain management: pain level controlled Vital Signs Assessment: post-procedure vital signs reviewed and stable Respiratory status: spontaneous breathing Cardiovascular status: blood pressure returned to baseline and stable Postop Assessment: no signs of nausea or vomiting, patient able to bend at knees and spinal receding Anesthetic complications: no    Last Vitals:  Filed Vitals:   09/06/15 1534 09/06/15 1547  BP:  143/89  Pulse: 71   Temp:  36.8 C  Resp:  16    Last Pain:  Filed Vitals:   09/06/15 1549  PainSc: 5                  Amish Mintzer EDWARD

## 2015-09-07 ENCOUNTER — Encounter (HOSPITAL_COMMUNITY): Payer: Self-pay | Admitting: Orthopedic Surgery

## 2015-09-07 LAB — CBC
HEMATOCRIT: 43.8 % (ref 39.0–52.0)
Hemoglobin: 14.4 g/dL (ref 13.0–17.0)
MCH: 30.2 pg (ref 26.0–34.0)
MCHC: 32.9 g/dL (ref 30.0–36.0)
MCV: 91.8 fL (ref 78.0–100.0)
Platelets: 229 10*3/uL (ref 150–400)
RBC: 4.77 MIL/uL (ref 4.22–5.81)
RDW: 12.8 % (ref 11.5–15.5)
WBC: 12.2 10*3/uL — AB (ref 4.0–10.5)

## 2015-09-07 LAB — BASIC METABOLIC PANEL
ANION GAP: 8 (ref 5–15)
BUN: 10 mg/dL (ref 6–20)
CALCIUM: 8.9 mg/dL (ref 8.9–10.3)
CO2: 28 mmol/L (ref 22–32)
Chloride: 103 mmol/L (ref 101–111)
Creatinine, Ser: 0.77 mg/dL (ref 0.61–1.24)
GFR calc Af Amer: >60 mL/min (ref >60)
GFR calc non Af Amer: >60 mL/min (ref >60)
GLUCOSE: 129 mg/dL — AB (ref 65–99)
POTASSIUM: 4.5 mmol/L (ref 3.5–5.1)
Sodium: 139 mmol/L (ref 135–145)

## 2015-09-07 NOTE — Progress Notes (Signed)
Occupational Therapy Evaluation Patient Details Name: [name] MRN: [id number] DOB: [birth year] Today's Date: 09/07/2015    History of Present Illness Pt is a 64 year old male s/p R TKA   Clinical Impression   Pt admitted with the above diagnoses and presents with below problem list. Pt will benefit from continued acute OT to address the below listed deficits and maximize independence with BADLs prior to d/c home with family. PTA pt was independent with ADLs. Pt is currently min guard for LB ADLs. Pt needed min A to control descent onto regular height toilet. Discussed equipment recommendations and ADL education. OT to continue to follow acutely.      Follow Up Recommendations  Supervision - Intermittent;Other (comment);No OT follow up (OOB/mobility)    Equipment Recommendations  3 in 1 bedside comode    Recommendations for Other Services       Precautions / Restrictions Precautions Precautions: Knee Precaution Comments: reviewed Restrictions Other Position/Activity Restrictions: WBAT      Mobility Bed Mobility Overal bed mobility: Needs Assistance Bed Mobility: Supine to Sit     Supine to sit: Min assist     General bed mobility comments: in recliner  Transfers Overall transfer level: Needs assistance Equipment used: Rolling walker (2 wheeled) Transfers: Sit to/from Stand Sit to Stand: Min guard;Min assist         General transfer comment: min guard from recliner, min A to control descent onto regular height toilet. Discussed 3n1 over toilet.     Balance                                            ADL Overall ADL's : Needs assistance/impaired Eating/Feeding: Set up;Sitting   Grooming: Min guard;Standing;Wash/dry hands   Upper Body Bathing: Set up;Sitting   Lower Body Bathing: Min guard;Sit to/from stand   Upper Body Dressing : Set up;Sitting   Lower Body Dressing: Min guard;Sit to/from stand   Toilet Transfer: Minimal  assistance;Ambulation;RW;Regular Glass blower/designer Details (indicate cue type and reason): min A to control descent onto regular height toilet. Dicussed 3n1 over toilet at home.  Toileting- Clothing Manipulation and Hygiene: Set up;Sitting/lateral lean;Sit to/from stand;Minimal assistance   Tub/ Shower Transfer: Tub transfer;Ambulation;3 in 1;Rolling walker;Minimal assistance   Functional mobility during ADLs: Min guard;Rolling walker General ADL Comments: Pt completed toilet transfer as detailed above. ADL education provided. Discussed AE and DME recommendations. Cued to not stand until rw in front of pt.      Vision     Perception     Praxis      Pertinent Vitals/Pain Pain Assessment: 0-10 Pain Score: 6  Pain Location: right knee Pain Descriptors / Indicators: Aching;Sore Pain Intervention(s): Monitored during session;Limited activity within patient's tolerance;Repositioned     Hand Dominance     Extremity/Trunk Assessment Upper Extremity Assessment Upper Extremity Assessment: Overall WFL for tasks assessed   Lower Extremity Assessment Lower Extremity Assessment: Defer to PT evaluation RLE Deficits / Details: able to perform one SLR, knee flexion sitting edge of chair active assist 75*       Communication Communication Communication: No difficulties   Cognition Arousal/Alertness: Awake/alert Behavior During Therapy: WFL for tasks assessed/performed Overall Cognitive Status: Within Functional Limits for tasks assessed                     General Comments  Exercises       Shoulder Instructions      Home Living Family/patient expects to be discharged to:: Private residence Living Arrangements: Spouse/significant other Available Help at Discharge: Family;Available 24 hours/day;Other (Comment) (for the first week) Type of Home: House       Home Layout: Two level Alternate Level Stairs-Number of Steps: 8 then 5 Alternate Level Stairs-Rails:  Right Bathroom Shower/Tub: Tub/shower unit         Home Equipment: None          Prior Functioning/Environment Level of Independence: Independent             OT Diagnosis: Acute pain   OT Problem List: Impaired balance (sitting and/or standing);Decreased knowledge of use of DME or AE;Decreased knowledge of precautions;Pain   OT Treatment/Interventions: Self-care/ADL training;DME and/or AE instruction;Therapeutic activities;Patient/family education;Balance training    OT Goals(Current goals can be found in the care plan section) Acute Rehab OT Goals Patient Stated Goal: not stated OT Goal Formulation: With patient Time For Goal Achievement: 09/14/15 Potential to Achieve Goals: Good ADL Goals Pt Will Perform Lower Body Bathing: with modified independence;sit to/from stand;with adaptive equipment Pt Will Perform Lower Body Dressing: with modified independence;with adaptive equipment;sit to/from stand Pt Will Transfer to Toilet: with modified independence;ambulating (3n1 over toilet) Pt Will Perform Toileting - Clothing Manipulation and hygiene: with modified independence;sitting/lateral leans;sit to/from stand Pt Will Perform Tub/Shower Transfer: Tub transfer;with modified independence;ambulating;3 in 1;rolling walker  OT Frequency: Min 2X/week   Barriers to D/C:            Co-evaluation              End of Session Equipment Utilized During Treatment: Gait belt;Rolling walker Nurse Communication: Other (comment) (IV alarm)  Activity Tolerance: Patient tolerated treatment well Patient left: in chair;with call bell/phone within reach   Time: 1213-1230 OT Time Calculation (min): 17 min Charges:  OT General Charges $OT Visit: 1 Procedure OT Evaluation $Initial OT Evaluation Tier I: 1 Procedure G-Codes:    Hortencia Pilar 09-15-15, 12:46 PM

## 2015-09-07 NOTE — Progress Notes (Signed)
CSW consulted for SNF placement. CSW met with pt and reviewed PN. Pt plans to return home with [facility] services at d/c. RNCM will assist with d/c planning needs.  Werner Lean LCSW [phone]

## 2015-09-07 NOTE — Progress Notes (Signed)
Patient ID: NOELLE DOTO, male   DOB: 04/07/1951, 64 y.o.   MRN: [id number] Subjective: 1 Day Post-Op Procedure(s) (LRB): RIGHT TOTAL KNEE ARTHROPLASTY (Right)    Patient reports pain as moderate. Not much sleep last night, but no events  Objective:   VITALS:   Filed Vitals:   09/07/15 0602 09/07/15 1004  BP: 139/87 150/99  Pulse: 79 90  Temp: 97.5 F (36.4 C) 97.4 F (36.3 C)  Resp: 16 17    Neurovascular intact Incision: dressing C/D/I  LABS  Recent Labs  09/07/15 0403  HGB 14.4  HCT 43.8  WBC 12.2*  PLT 229     Recent Labs  09/07/15 0403  NA 139  K 4.5  BUN 10  CREATININE 0.77  GLUCOSE 129*    No results for input(s): LABPT, INR in the last 72 hours.   Assessment/Plan: 1 Day Post-Op Procedure(s) (LRB): RIGHT TOTAL KNEE ARTHROPLASTY (Right)   Advance diet Up with therapy Plan for discharge tomorrow Discharge home with home health   Work on therapy to improve independence for home ADLs

## 2015-09-07 NOTE — Care Management Note (Signed)
Case Management Note  Patient Details  Name: [name] MRN: [id number] Date of Birth: Oct 07, 1950  Subjective/Objective:    S/p Right total knee replacement                Action/Plan: Discharge planning, spoke with patient at bedside. Have chosen [facility]. Contacted [facility] for referral. Needs RW and 3-n-1, contacted [facility] to deliver to room.   Expected Discharge Date:                  Expected Discharge Plan:  [place]  In-House Referral:  NA  Discharge planning Services  CM Consult  Post Acute Care Choice:  Home Health, Durable Medical Equipment Choice offered to:  Patient  DME Arranged:  3-N-1, Walker rolling DME Agency:  [place]:  PT [place]:  [place]  Status of Service:  Completed, signed off  Medicare Important Message Given:    Date Medicare IM Given:    Medicare IM give by:    Date Additional Medicare IM Given:    Additional Medicare Important Message give by:     If discussed at [place] of Stay Meetings, dates discussed:    Additional Comments:  Guadalupe Maple, RN 09/07/2015, 1:42 PM

## 2015-09-07 NOTE — Progress Notes (Signed)
Physical Therapy Treatment Note    09/07/15 1517  PT Visit Information  Last PT Received On 09/07/15  Assistance Needed +1  History of Present Illness Pt is a 64 year old male s/p R TKA  PT Time Calculation  PT Start Time (ACUTE ONLY) 1445  PT Stop Time (ACUTE ONLY) 1455  PT Time Calculation (min) (ACUTE ONLY) 10 min  Subjective Data  Subjective Pt ambulated in hallway again and anticipates d/c home tomorrow.  Precautions  Precautions Knee  Restrictions  Other Position/Activity Restrictions WBAT  Pain Assessment  Pain Assessment 0-10  Pain Score 4  Pain Location R knee  Pain Descriptors / Indicators Aching;Sore  Pain Intervention(s) Limited activity within patient's tolerance;Monitored during session;Repositioned  Cognition  Arousal/Alertness Awake/alert  Behavior During Therapy WFL for tasks assessed/performed  Overall Cognitive Status Within Functional Limits for tasks assessed  Bed Mobility  General bed mobility comments up in recliner  Transfers  Overall transfer level Needs assistance  Equipment used Rolling walker (2 wheeled)  Transfers Sit to/from Stand  Sit to Stand Supervision  General transfer comment verbal cues for safe technique  Ambulation/Gait  Ambulation/Gait assistance Min guard;Supervision  Ambulation Distance (Feet) 200 Feet  Assistive device Rolling walker (2 wheeled)  Gait Pattern/deviations Step-to pattern;Step-through pattern;Antalgic;Decreased stride length  General Gait Details verbal cues for sequence, step length, RW positioning  PT - End of Session  Equipment Utilized During Treatment Gait belt  Activity Tolerance Patient tolerated treatment well  Patient left in chair;with call bell/phone within reach  PT - Assessment/Plan  PT Plan Current plan remains appropriate  PT Frequency (ACUTE ONLY) 7X/week  Follow Up Recommendations Home health PT  PT equipment Rolling walker with 5" wheels  PT Goal Progression  Progress towards PT goals  Progressing toward goals  PT General Charges  $$ ACUTE PT VISIT 1 Procedure  PT Treatments  $Gait Training 8-22 mins   Carmelia Bake, PT, DPT 09/07/2015 Pager: [phone]

## 2015-09-07 NOTE — Evaluation (Signed)
Physical Therapy Evaluation Patient Details Name: [name] MRN: [id number] DOB: 1951-04-19 Today's Date: 09/07/2015   History of Present Illness  Pt is a 64 year old male s/p R TKA  Clinical Impression  Pt is s/p R TKA resulting in the deficits listed below (see PT Problem List).   Pt will benefit from skilled PT to increase their independence and safety with mobility to allow discharge to the venue listed below.   Pt mobilizing well POD #1.  Pt also performed LE exercises.     Follow Up Recommendations Home health PT    Equipment Recommendations  Rolling walker with 5" wheels    Recommendations for Other Services       Precautions / Restrictions Precautions Precautions: Knee Restrictions Other Position/Activity Restrictions: WBAT      Mobility  Bed Mobility Overal bed mobility: Needs Assistance Bed Mobility: Supine to Sit     Supine to sit: Min assist     General bed mobility comments: slight assist for R LE  Transfers Overall transfer level: Needs assistance Equipment used: Rolling walker (2 wheeled) Transfers: Sit to/from Stand Sit to Stand: Min guard         General transfer comment: verbal cues for safe technique  Ambulation/Gait Ambulation/Gait assistance: Min guard Ambulation Distance (Feet): 180 Feet Assistive device: Rolling walker (2 wheeled) Gait Pattern/deviations: Step-to pattern;Antalgic     General Gait Details: verbal cues for sequence, step length, RW positioning  Stairs            Wheelchair Mobility    Modified Rankin (Stroke Patients Only)       Balance                                             Pertinent Vitals/Pain Pain Assessment: 0-10 Pain Score: 3  Pain Location: R knee Pain Descriptors / Indicators: Sore;Aching Pain Intervention(s): Limited activity within patient's tolerance;Monitored during session;Premedicated before session;Repositioned;Ice applied    Home Living  Family/patient expects to be discharged to:: Private residence Living Arrangements: Spouse/significant other   Type of Home: House       Home Layout: Two level Home Equipment: None      Prior Function Level of Independence: Independent               Hand Dominance        Extremity/Trunk Assessment               Lower Extremity Assessment: RLE deficits/detail RLE Deficits / Details: able to perform one SLR, knee flexion sitting edge of chair active assist 75*       Communication   Communication: No difficulties  Cognition Arousal/Alertness: Awake/alert Behavior During Therapy: WFL for tasks assessed/performed Overall Cognitive Status: Within Functional Limits for tasks assessed                      General Comments      Exercises Total Joint Exercises Ankle Circles/Pumps: AROM;Both;10 reps Quad Sets: AROM;Both;10 reps Short Arc QuadSinclair Ship;Right;10 reps Heel Slides: AAROM;Seated;Right;10 reps Hip ABduction/ADduction: AAROM;Right;10 reps Straight Leg Raises: AAROM;Right;10 reps      Assessment/Plan    PT Assessment Patient needs continued PT services  PT Diagnosis Difficulty walking;Acute pain   PT Problem List Decreased strength;Decreased range of motion;Decreased mobility;Decreased knowledge of precautions;Decreased knowledge of use of DME;Pain  PT Treatment Interventions Functional mobility  training;Stair training;Gait training;DME instruction;Patient/family education;Therapeutic activities;Therapeutic exercise   PT Goals (Current goals can be found in the Care Plan section) Acute Rehab PT Goals PT Goal Formulation: With patient Time For Goal Achievement: 09/11/15 Potential to Achieve Goals: Good    Frequency 7X/week   Barriers to discharge        Co-evaluation               End of Session Equipment Utilized During Treatment: Gait belt Activity Tolerance: Patient tolerated treatment well Patient left: in chair;with call  bell/phone within reach Nurse Communication: Mobility status         Time: [id number] PT Time Calculation (min) (ACUTE ONLY): 21 min   Charges:   PT Evaluation $Initial PT Evaluation Tier I: 1 Procedure     PT G Codes:        Creedence Heiss,KATHrine E 09/07/2015, 12:24 PM Carmelia Bake, PT, DPT 09/07/2015 Pager: [phone]

## 2015-09-08 DIAGNOSIS — E669 Obesity, unspecified: Secondary | ICD-10-CM | POA: Diagnosis present

## 2015-09-08 LAB — BASIC METABOLIC PANEL
Anion gap: 7 (ref 5–15)
BUN: 15 mg/dL (ref 6–20)
CALCIUM: 8.8 mg/dL — AB (ref 8.9–10.3)
CO2: 29 mmol/L (ref 22–32)
CREATININE: 0.73 mg/dL (ref 0.61–1.24)
Chloride: 105 mmol/L (ref 101–111)
GFR calc non Af Amer: >60 mL/min (ref >60)
Glucose, Bld: 121 mg/dL — ABNORMAL HIGH (ref 65–99)
Potassium: 4.6 mmol/L (ref 3.5–5.1)
SODIUM: 141 mmol/L (ref 135–145)

## 2015-09-08 LAB — CBC
HCT: 39.8 % (ref 39.0–52.0)
Hemoglobin: 13.2 g/dL (ref 13.0–17.0)
MCH: 30.9 pg (ref 26.0–34.0)
MCHC: 33.2 g/dL (ref 30.0–36.0)
MCV: 93.2 fL (ref 78.0–100.0)
PLATELETS: 227 10*3/uL (ref 150–400)
RBC: 4.27 MIL/uL (ref 4.22–5.81)
RDW: 13.2 % (ref 11.5–15.5)
WBC: 11.3 10*3/uL — ABNORMAL HIGH (ref 4.0–10.5)

## 2015-09-08 MED ORDER — FERROUS SULFATE 325 (65 FE) MG PO TABS: 325.0000 mg | ORAL_TABLET | Freq: Three times a day (TID) | ORAL | Status: DC

## 2015-09-08 MED ORDER — METHOCARBAMOL 500 MG PO TABS: 500.0000 mg | ORAL_TABLET | Freq: Four times a day (QID) | ORAL | Status: DC | PRN

## 2015-09-08 MED ORDER — POLYETHYLENE GLYCOL 3350 17 G PO PACK: 17.0000 g | PACK | Freq: Two times a day (BID) | ORAL | Status: DC

## 2015-09-08 MED ORDER — DOCUSATE SODIUM 100 MG PO CAPS: 100.0000 mg | ORAL_CAPSULE | Freq: Two times a day (BID) | ORAL | Status: DC

## 2015-09-08 MED ORDER — HYDROCODONE-ACETAMINOPHEN 7.5-325 MG PO TABS: 1.0000 | ORAL_TABLET | ORAL | Status: DC | PRN

## 2015-09-08 MED ORDER — ASPIRIN 325 MG PO TBEC: 325.0000 mg | DELAYED_RELEASE_TABLET | Freq: Two times a day (BID) | ORAL | Status: AC

## 2015-09-08 NOTE — Progress Notes (Signed)
Occupational Therapy Treatment Patient Details Name: [name] MRN: [id number] DOB: [birth year] Today's Date: 09/08/2015    History of present illness Pt is a 64 year old male s/p R TKA   OT comments  Pt is making good progress in OT.  Verbalizes understanding of safey and precautions.    Follow Up Recommendations  Supervision - Intermittent;Other (comment);No OT follow up    Equipment Recommendations  None recommended by OT (pt wants to try his standard commode at home; will let [facility] know if he needs a 3:1, prefers to do without)    Recommendations for Other Services      Precautions / Restrictions Precautions Precautions: Knee Precaution Comments: reviewed Restrictions Other Position/Activity Restrictions: WBAT       Mobility Bed Mobility               General bed mobility comments: up in recliner  Transfers   Equipment used: Rolling walker (2 wheeled) Transfers: Sit to/from Stand Sit to Stand: Supervision              Balance                                   ADL                       Lower Body Dressing: Supervision/safety;Sit to/from stand (pants only)   Toilet Transfer: Min guard;Comfort height toilet;Ambulation             General ADL Comments: simulated tub transfer:  min A--steadying for safety.  Pt is able to lift leg high enough to clear tub ledge.  He plans to do a standing shower.  educated to reach what he can get to with both legs on floor and to make sure he feels steady prior to doing this.  He will have assistance 24/7 until Monday      Vision                     Perception     Praxis      Cognition   Behavior During Therapy: [facility] for tasks assessed/performed Overall Cognitive Status: Within Functional Limits for tasks assessed                       Extremity/Trunk Assessment               Exercises     Shoulder Instructions       General Comments       Pertinent Vitals/ Pain       Pain Score: 2  Pain Location: R knee Pain Descriptors / Indicators: Sore Pain Intervention(s): Limited activity within patient's tolerance;Monitored during session;Premedicated before session;Repositioned;Ice applied  Home Living                                          Prior Functioning/Environment              Frequency       Progress Toward Goals  OT Goals(current goals can now be found in the care plan section)  Progress towards OT goals: Progressing toward goals     Plan      Co-evaluation  End of Session     Activity Tolerance Patient tolerated treatment well   Patient Left in chair;with call bell/phone within reach   Nurse Communication          Time: [phone] OT Time Calculation (min): 12 min  Charges: OT General Charges $OT Visit: 1 Procedure OT Treatments $Self Care/Home Management : 8-22 mins  Essam Lowdermilk 09/08/2015, 10:01 AM Lesle Chris, OTR/L [phone] 09/08/2015

## 2015-09-08 NOTE — Progress Notes (Signed)
Physical Therapy Treatment Patient Details Name: [name] MRN: [id number] DOB: 1951/08/06 Today's Date: 10/05/2015    History of Present Illness Pt is a 64 year old male s/p R TKA    PT Comments    Pt ambulated in hallway and practiced steps.  Pt also performed LE exercises.  Pt feels ready for d/c home today.  Follow Up Recommendations  Home health PT     Equipment Recommendations  Rolling walker with 5" wheels    Recommendations for Other Services       Precautions / Restrictions Precautions Precautions: Knee Precaution Comments: reviewed Restrictions Other Position/Activity Restrictions: WBAT    Mobility  Bed Mobility Overal bed mobility: Needs Assistance Bed Mobility: Supine to Sit     Supine to sit: Supervision     General bed mobility comments: up in recliner  Transfers Overall transfer level: Needs assistance Equipment used: Rolling walker (2 wheeled) Transfers: Sit to/from Stand Sit to Stand: Supervision         General transfer comment: verbal cues for safe technique  Ambulation/Gait Ambulation/Gait assistance: Supervision Ambulation Distance (Feet): 200 Feet Assistive device: Rolling walker (2 wheeled) Gait Pattern/deviations: Step-through pattern;Decreased stride length;Antalgic     General Gait Details: verbal cues for posture, RW positioning   Stairs Stairs: Yes Stairs assistance: Supervision Stair Management: Step to pattern;Forwards;Two rails;One rail Right Number of Stairs: 3 General stair comments: verbal cues for safety and sequence, performed with 2 rails and then again with one rail (pt only has one rail at home)  Wheelchair Mobility    Modified Rankin (Stroke Patients Only)       Balance                                    Cognition Arousal/Alertness: Awake/alert Behavior During Therapy: WFL for tasks assessed/performed Overall Cognitive Status: Within Functional Limits for tasks assessed                       Exercises Total Joint Exercises Ankle Circles/Pumps: AROM;Both;10 reps Quad Sets: AROM;Both;10 reps Short Arc Quad: Right;10 reps;AROM Heel Slides: AAROM;Seated;Right;10 reps Hip ABduction/ADduction: Right;10 reps;AROM Straight Leg Raises: Right;10 reps;AROM    General Comments        Pertinent Vitals/Pain Pain Assessment: 0-10 Pain Score: 2  Pain Location: R knee Pain Descriptors / Indicators: Sore Pain Intervention(s): Limited activity within patient's tolerance;Monitored during session;Premedicated before session;Repositioned;Ice applied    Home Living                      Prior Function            PT Goals (current goals can now be found in the care plan section) Progress towards PT goals: Progressing toward goals    Frequency  7X/week    PT Plan Current plan remains appropriate    Co-evaluation             End of Session   Activity Tolerance: Patient tolerated treatment well Patient left: in chair;with call bell/phone within reach     Time: [id number] PT Time Calculation (min) (ACUTE ONLY): 21 min  Charges:  $Gait Training: 8-22 mins                    G Codes:      Darius Lundberg,KATHrine E 10-05-15, 12:55 PM Carmelia Bake, PT, DPT 2015/10/05 Pager: [phone]

## 2015-09-08 NOTE — Progress Notes (Signed)
     Subjective: 2 Days Post-Op Procedure(s) (LRB): RIGHT TOTAL KNEE ARTHROPLASTY (Right)   Patient reports pain as mild, pain controlled. No events throughout the night.  Ready to be discharged home.   Objective:   VITALS:   Filed Vitals:   09/08/15 0436 09/08/15 0900  BP: 154/80 141/71  Pulse: 72   Temp: 97.6 F (36.4 C)   Resp: 16     Dorsiflexion/Plantar flexion intact Incision: dressing C/D/I No cellulitis present Compartment soft  LABS  Recent Labs  09/07/15 0403 09/08/15 0413  HGB 14.4 13.2  HCT 43.8 39.8  WBC 12.2* 11.3*  PLT 229 227     Recent Labs  09/07/15 0403 09/08/15 0413  NA 139 141  K 4.5 4.6  BUN 10 15  CREATININE 0.77 0.73  GLUCOSE 129* 121*     Assessment/Plan: 2 Days Post-Op Procedure(s) (LRB): RIGHT TOTAL KNEE ARTHROPLASTY (Right) Up with therapy Discharge home with home health  Follow up in 2 weeks at [facility]. Follow up with OLIN,Damarys Speir D in 2 weeks.  Contact information:  [facility] [address], [place] [id number]    Obese (BMI 30-39.9) Estimated body mass index is 30 kg/(m^2) as calculated from the following:   Height as of this encounter: 5\' 11"  (1.803 m).   Weight as of this encounter: 97.523 kg (215 lb). Patient also counseled that weight may inhibit the healing process Patient counseled that losing weight will help with future health issues         [place] Pugh. Cherice Glennie   PAC  09/08/2015, 9:40 AM

## 2015-09-15 NOTE — Discharge Summary (Signed)
Physician Discharge Summary  Patient ID: [name] MRN: [id number] DOB/AGE: 11-02-1950 64 y.o.  Admit date: 09/06/2015 Discharge date: 09/08/2015   Procedures:  Procedure(s) (LRB): RIGHT TOTAL KNEE ARTHROPLASTY (Right)  Attending Physician:  Dr. [name]   Admission Diagnoses:   Right knee primary OA / pain  Discharge Diagnoses:  Principal Problem:   S/P right TKA Active Problems:   S/P knee replacement   Obese  Past Medical History  Diagnosis Date  . Allergy     seasonal  . GERD (gastroesophageal reflux disease)   . Hypertension   . Arthritis     oa  . Shortness of breath dyspnea     HPI:    [name], 64 y.o. male, has a history of pain and functional disability in the right knee due to arthritis and has failed non-surgical conservative treatments for greater than 12 weeks to include NSAID's and/or analgesics, corticosteriod injections and activity modification. Onset of symptoms was gradual, starting 2+ years ago with gradually worsening course since that time. The patient noted prior procedures on the knee to include arthroscopy and menisectomy on the right knee(s). Patient currently rates pain in the right knee(s) at 8 out of 10 with activity. Patient has worsening of pain with activity and weight bearing, pain that interferes with activities of daily living, pain with passive range of motion, crepitus and joint swelling. Patient has evidence of periarticular osteophytes and joint space narrowing by imaging studies. There is no active infection. Risks, benefits and expectations were discussed with the patient. Risks including but not limited to the risk of anesthesia, blood clots, nerve damage, blood vessel damage, failure of the prosthesis, infection and up to and including death. Patient understand the risks, benefits and expectations and wishes to proceed with surgery.   PCP: Kennon Portela, MD   Discharged Condition: good  Hospital  Course:  Patient underwent the above stated procedure on 09/06/2015. Patient tolerated the procedure well and brought to the recovery room in good condition and subsequently to the floor.  POD #1 BP: 150/9 ; Pulse: 90 ; Temp: 97.4 F (36.3 C) ; Resp: 17 Patient reports pain as moderate. Not much sleep last night, but no events. Neurovascular intact and incision: dressing C/D/I  LABS  Basename    HGB     14.4  HCT     43.8   POD #2  BP: 141/71 ; Pulse: 72 ; Temp: 97.6 F (36.4 C) ; Resp: 16 Patient reports pain as mild, pain controlled. No events throughout the night. Ready to be discharged home.  Dorsiflexion/plantar flexion intact, incision: dressing C/D/I, no cellulitis present and compartment soft.   LABS  Basename    HGB     13.2  HCT     39.8    Discharge Exam: General appearance: alert, cooperative and no distress Extremities: Homans sign is negative, no sign of DVT, no edema, redness or tenderness in the calves or thighs and no ulcers, gangrene or trophic changes  Disposition: Home with follow up in 2 weeks   Follow-up Information    Follow up with Mauri Pole, MD. Schedule an appointment as soon as possible for a visit in 2 weeks.   Specialty:  Orthopedic Surgery   Contact information:   [address] [place] [address] [phone]       Follow up with [facility].   Why:  physical therapy   Contact information:   [place]  [address] [place] [address] [phone]       Follow up with [facility].   Why:  rolling walker and 3-n-1   Contact information:   [address] [place] [address] [phone]       Discharge Instructions    Call MD / Call 911    Complete by:  As directed   If you experience chest pain or shortness of breath, CALL 911 and be transported to the hospital emergency room.  If you develope a fever above 101 F, pus (white drainage) or increased drainage or redness  at the wound, or calf pain, call your surgeon's office.     Change dressing    Complete by:  As directed   Maintain surgical dressing until follow up in the clinic. If the edges start to pull up, may reinforce with tape. If the dressing is no longer working, may remove and cover with gauze and tape, but must keep the area dry and clean.  Call with any questions or concerns.     Constipation Prevention    Complete by:  As directed   Drink plenty of fluids.  Prune juice may be helpful.  You may use a stool softener, such as Colace (over the counter) 100 mg twice a day.  Use MiraLax (over the counter) for constipation as needed.     Diet - low sodium heart healthy    Complete by:  As directed      Discharge instructions    Complete by:  As directed   Maintain surgical dressing until follow up in the clinic. If the edges start to pull up, may reinforce with tape. If the dressing is no longer working, may remove and cover with gauze and tape, but must keep the area dry and clean.  Follow up in 2 weeks at [facility]. Call with any questions or concerns.     Increase activity slowly as tolerated    Complete by:  As directed   Weight bearing as tolerated with assist device (walker, cane, etc) as directed, use it as long as suggested by your surgeon or therapist, typically at least 4-6 weeks.     TED hose    Complete by:  As directed   Use stockings (TED hose) for 2 weeks on both leg(s).  You may remove them at night for sleeping.             Medication List    STOP taking these medications        HYDROcodone-acetaminophen 5-325 MG tablet  Commonly known as:  NORCO/VICODIN  Replaced by:  HYDROcodone-acetaminophen 7.5-325 MG tablet     HYDROmorphone 2 MG tablet  Commonly known as:  DILAUDID     ibuprofen 800 MG tablet  Commonly known as:  ADVIL,MOTRIN     naproxen sodium 220 MG tablet  Commonly known as:  ANAPROX      TAKE these medications        albuterol 108 (90  Base) MCG/ACT inhaler  Commonly known as:  PROVENTIL HFA;VENTOLIN HFA  Inhale 2 puffs into the lungs every 4 (four) hours as needed for wheezing or shortness of breath.     amLODipine 5 MG tablet  Commonly known as:  NORVASC  Take 1 tablet (5 mg total) by mouth daily.     aspirin 325 MG EC tablet  Take 1 tablet (325 mg total) by mouth 2 (two) times daily.     cholecalciferol 1000  units tablet  Commonly known as:  VITAMIN D  Take 2,000 Units by mouth daily.     clobetasol ointment 0.05 %  Commonly known as:  TEMOVATE  Apply topically 2 (two) times daily. Apply bid to itchy rash, do not use on face, genitals, axillae     dexlansoprazole 60 MG capsule  Commonly known as:  DEXILANT  Take 1 capsule (60 mg total) by mouth daily.     docusate sodium 100 MG capsule  Commonly known as:  COLACE  Take 1 capsule (100 mg total) by mouth 2 (two) times daily.     ferrous sulfate 325 (65 FE) MG tablet  Take 1 tablet (325 mg total) by mouth 3 (three) times daily after meals.     fluticasone 50 MCG/ACT nasal spray  Commonly known as:  FLONASE  Place 2 sprays into both nostrils daily.     HYDROcodone-acetaminophen 7.5-325 MG tablet  Commonly known as:  NORCO  Take 1-2 tablets by mouth every 4 (four) hours as needed for moderate pain.     methocarbamol 500 MG tablet  Commonly known as:  ROBAXIN  Take 1 tablet (500 mg total) by mouth every 6 (six) hours as needed for muscle spasms.     OPCON-A 0.027-0.315 % Soln  Generic drug:  Naphazoline-Pheniramine  Apply 1 drop to eye 2 (two) times daily as needed (dry/irritated eyes).     polyethylene glycol packet  Commonly known as:  MIRALAX / GLYCOLAX  Take 17 g by mouth 2 (two) times daily.     SALONPAS ARTHRITIS PAIN RELIEF Pads  Apply 1 each topically daily as needed (Pain).     sildenafil 20 MG tablet  Commonly known as:  REVATIO  TAKE 1 TABLET BY MOUTH DAILY AS NEEDED FOR ERECTILE DYSFUNCTION.     tadalafil 20 MG tablet  Commonly  known as:  CIALIS  Take 1 tablet (20 mg total) by mouth daily as needed for erectile dysfunction.     tiotropium 18 MCG inhalation capsule  Commonly known as:  SPIRIVA  Place 1 capsule (18 mcg total) into inhaler and inhale daily.     trolamine salicylate 10 % cream  Commonly known as:  ASPERCREME  Apply 1 application topically as needed for muscle pain.         Signed: [place]. Adyson Vanburen   PA-C  09/15/2015, 8:51 AM

## 2015-10-28 ENCOUNTER — Ambulatory Visit (INDEPENDENT_AMBULATORY_CARE_PROVIDER_SITE_OTHER): Payer: Managed Care, Other (non HMO) | Admitting: Emergency Medicine

## 2015-10-28 ENCOUNTER — Encounter: Payer: Self-pay | Admitting: Emergency Medicine

## 2015-10-28 VITALS — BP 126/86 | HR 100 | Ht 71.0 in | Wt 212.0 lb

## 2015-10-28 DIAGNOSIS — R918 Other nonspecific abnormal finding of lung field: Secondary | ICD-10-CM

## 2015-10-28 DIAGNOSIS — J449 Chronic obstructive pulmonary disease, unspecified: Secondary | ICD-10-CM

## 2015-10-28 NOTE — Progress Notes (Signed)
Subjective:    Patient ID: [name], male    DOB: 07/04/1951, 65 y.o.   MRN: [id number]  HPI 65 yo former smoker (30 pk-yrs), HTN, GERD, allergies, OA. Referred today for pulmonary nodules on CT chest.  He underwent CXR in 06/2013 that showed possible nodule, prompted CT chest that confirmed scattered sub-49mm nodules. Repeat CT 4/15 showed that the nodules are stable, also a new small area of GGI. He has some occasional wheeze, is able to stay active. He occasionally gets choked on her own secretions. He is on ACE-i.   ROV 07/20/14 -- f/u visit for hx tobacco, pulmonary nodules that have been scanned first in 10/'14, then stable 01/05/14. There was a small GGI to follow as well. He underwent PFTs in July 2015 that showed mild obstructive lung disease. CT scan from 07/13/14 is stable, both the nodules and the R basilar GG nodule. Next scan should be in 1 yr.   ROV 09/21/14 -- follow up for obstructive lung disease and pulmonary nodules that are been followed by serial CT scans. The last scan was on 07/13/14 and was stable.   ROV 08/09/15 -- follow-up visit for scattered pulmonary nodules by CT scan of the chest as well as mild obstructive lung disease in the setting of former tobacco use. Repeat CT scan of his chest was done 07/12/15 that I personally reviewed. This showed unchanged bilateral small pulmonary nodules. A 9 mm semi solid ground glass opacity in the posterior right lower lobe has resolved. He should not need any subsequent scans. He is planning to undergo R knee replacement with Dr Theda Sers. He reports some occasional exertional SOB with strenuous work, some rare cough. He hears some wheeze when he lays supine. He uses albuterol 2x a week. Hard to tell if the albuterol helps him. We did spirometry today > FEV1 2.47L (78% predicted).   ROV 10/28/15 -- follow-up visit for COPD and bilateral pulmonary nodules on CT scan of the chest that have been deemed benign with multiple serial scans.   He underwent R knee surgery 2 months ago. At his last visit we started Spiriva. He thought that it did help him some - made his breathing a bit better. He has SABA, was using it about every day but not really sure it was doing much. He is having minimal cough. He is having mid back pain, both sides, better with laying down supine. Sometimes worse with cough.    Review of Systems  Constitutional: Negative for fever and unexpected weight change.  HENT: Positive for sinus pressure and trouble swallowing. Negative for congestion, dental problem, ear pain, nosebleeds, postnasal drip, rhinorrhea, sneezing and sore throat.   Eyes: Negative for redness and itching.  Respiratory: Positive for shortness of breath and wheezing. Negative for cough and chest tightness.   Cardiovascular: Positive for leg swelling. Negative for palpitations.       Knees   Gastrointestinal: Negative for nausea and vomiting.  Genitourinary: Negative for dysuria.  Musculoskeletal: Negative for joint swelling.  Skin: Negative for rash.  Neurological: Negative for headaches.  Hematological: Does not bruise/bleed easily.  Psychiatric/Behavioral: Negative for dysphoric mood. The patient is not nervous/anxious.         Objective:   Physical Exam Filed Vitals:   10/28/15 1358  BP: 126/86  Pulse: 100  Height: 5\' 11"  (1.803 m)  Weight: 212 lb (96.163 kg)  SpO2: 96%   Gen: Pleasant, well-nourished, in no distress,  normal affect  ENT: No  lesions,  mouth clear,  oropharynx clear, no postnasal drip  Neck: No JVD, no TMG, no carotid bruits  Lungs: No use of accessory muscles, clear without rales or rhonchi  Cardiovascular: RRR, heart sounds normal, no murmur or gallops, no peripheral edema  Musculoskeletal: No deformities, no cyanosis or clubbing  Neuro: alert, non focal  Skin: Warm, no lesions or rashes  07/13/14 --  COMPARISON: 01/05/2014  FINDINGS: Mediastinum: Normal heart size. No pericardial effusion.  The trachea appears patent and is midline. Normal appearance of the esophagus. No mediastinal or hilar adenopathy identified.  Lungs/Pleura: There is no pleural effusion identified. Index nodule in the right upper lobe measures 4 mm and is unchanged from previous exam. Subpleural nodule within the anterior right upper lobe is unchanged measuring 4 mm, image 24/series 3. Subpleural right middle lobe nodule is unchanged measuring 3 mm, image 33/series 3. No new or enlarging pulmonary nodules or mass is identified. Sub solid lesion within the right base measures 9 mm, image 105 of series 602. This is stable when compared with 01/05/2014 but is new from 07/04/2013.  Upper Abdomen: The visualized portions of the liver, gallbladder, and pancreas are unremarkable. The spleen is negative.  Musculoskeletal: Review of the visualized bony structures is on unremarkable. No aggressive lytic or sclerotic bone lesions.  IMPRESSION: 1. Stable small solid pulmonary nodules. Compatible with benign lesions. 2. There is a 9 mm sub solid lesion in the right base which is unchanged from 01/05/2014 but new from 07/04/2013. This will need annual surveillance for minimum of 36 months to document of benignity. The next followup examination should be obtained on 07/14/2015. A PET-CT would be of limited value in evaluating this lesion. This recommendation follows the consensus statement: Recommendations for the Management of Subsolid Pulmonary Nodules Detected at CT: A Statement from the [employer] as published in Radiology 2013; 266:304-317.     Assessment & Plan:  COPD, mild We will continue Spiriva once a day as it seems that he has benefited. Not clear to me whether his back discomfort is related cough or work to breathe but I doubt that this is the case. We will also refill albuterol when necessary. I'll in 6 months  Pulmonary nodules Benign based on stability for over 2 years. If he  continues to have back pain and is increasingly concerned about the risk for lung cancer then we may consider repeating a scan to reassure him on stability

## 2015-10-28 NOTE — Patient Instructions (Addendum)
We will continue spiriva once a day  We will continue albuterol 2 puffs as needed for shortness of breath.  Follow with Dr [name] in 6 months or sooner if you have any problems

## 2015-10-28 NOTE — Assessment & Plan Note (Signed)
Benign based on stability for over 2 years. If he continues to have back pain and is increasingly concerned about the risk for lung cancer then we may consider repeating a scan to reassure him on stability

## 2015-10-28 NOTE — Assessment & Plan Note (Signed)
We will continue Spiriva once a day as it seems that he has benefited. Not clear to me whether his back discomfort is related cough or work to breathe but I doubt that this is the case. We will also refill albuterol when necessary. I'll in 6 months

## 2016-01-12 ENCOUNTER — Other Ambulatory Visit: Payer: Self-pay | Admitting: Urology

## 2016-01-17 DIAGNOSIS — C61 Malignant neoplasm of prostate: Secondary | ICD-10-CM

## 2016-01-17 HISTORY — DX: Malignant neoplasm of prostate: C61

## 2016-02-02 DIAGNOSIS — Z0271 Encounter for disability determination: Secondary | ICD-10-CM

## 2016-02-03 NOTE — Progress Notes (Signed)
[facility] PUL[facility] DR BYNUM 10-28-15 EPIC CHEST CT 07-12-15 EPIC PULMONARY FUNCTION TEST [id number] EPIC EKG 08-03-15 EPIC

## 2016-02-03 NOTE — Patient Instructions (Addendum)
TREIGHTON MEZEY  02/03/2016   Your procedure is scheduled on: 02-09-16  Report to [facility]  Entrance take [facility]  elevators to 3rd floor to  [place] at 1030 AM.  Call this number if you have problems the morning of surgery [phone]   Remember: ONLY 1 PERSON MAY GO WITH YOU TO SHORT STAY TO GET  READY MORNING OF [facility].  Do not eat food or drink liquids :After Midnight.      Take these medicines the morning of surgery with A SIP OF WATER: ALBUTEROL INHALER IF NEEDED AND BRING INHALER, AMLODIPINE (NORVASC), DEXLANSOPRAZOLE (DEXILANT, HYDROCODNE IF NEEDED, FLONASE NASAL SPRAY IF NEEDED, SPIRIVA              You may not have any metal on your body including hair pins and              piercings  Do not wear jewelry, make-up, lotions, powders or perfumes, deodorant             Do not wear nail polish.  Do not shave  48 hours prior to surgery.              Men may shave face and neck.   Do not bring valuables to the hospital. [place].  Contacts, dentures or bridgework may not be worn into surgery.  Leave suitcase in the car. After surgery it may be brought to your room.               Please read over the following fact sheets you were given: _____________________________________________________________________                CLEAR LIQUID DIET   Foods Allowed                                                                     Foods Excluded  Coffee and tea, regular and decaf                             liquids that you cannot  Plain Jell-O in any flavor                                             see through such as: Fruit ices (not with fruit pulp)                                     milk, soups, orange juice  Iced Popsicles                                    All solid food Carbonated beverages, regular and diet  Cranberry, grape and apple  juices Sports drinks like Gatorade Lightly seasoned clear broth or consume(fat free) Sugar, honey syrup  Sample Menu Breakfast                                Lunch                                     Supper Cranberry juice                    Beef broth                            Chicken broth Jell-O                                     Grape juice                           Apple juice Coffee or tea                        Jell-O                                      Popsicle                                                Coffee or tea                        Coffee or tea  _____________________________________________________________________  [facility] - Preparing for Surgery Before surgery, you can play an important role.  Because skin is not sterile, your skin needs to be as free of germs as possible.  You can reduce the number of germs on your skin by washing with CHG (chlorahexidine gluconate) soap before surgery.  CHG is an antiseptic cleaner which kills germs and bonds with the skin to continue killing germs even after washing. Please DO NOT use if you have an allergy to CHG or antibacterial soaps.  If your skin becomes reddened/irritated stop using the CHG and inform your nurse when you arrive at Short Stay. Do not shave (including legs and underarms) for at least 48 hours prior to the first CHG shower.  You may shave your face/neck. Please follow these instructions carefully:  1.  Shower with CHG Soap the night before surgery and the  morning of Surgery.  2.  If you choose to wash your hair, wash your hair first as usual with your  normal  shampoo.  3.  After you shampoo, rinse your hair and body thoroughly to remove the  shampoo.                           4.  Use CHG as you would any other liquid soap.  You can apply chg directly  to the skin and wash  Gently with a scrungie or clean washcloth.  5.  Apply the CHG Soap to your body ONLY FROM THE NECK DOWN.   Do not  use on face/ open                           Wound or open sores. Avoid contact with eyes, ears mouth and genitals (private parts).                       Wash face,  Genitals (private parts) with your normal soap.             6.  Wash thoroughly, paying special attention to the area where your surgery  will be performed.  7.  Thoroughly rinse your body with warm water from the neck down.  8.  DO NOT shower/wash with your normal soap after using and rinsing off  the CHG Soap.                9.  Pat yourself dry with a clean towel.            10.  Wear clean pajamas.            11.  Place clean sheets on your bed the night of your first shower and do not  sleep with pets. Day of Surgery : Do not apply any lotions/deodorants the morning of surgery.  Please wear clean clothes to the hospital/surgery center.  FAILURE TO FOLLOW THESE INSTRUCTIONS MAY RESULT IN THE CANCELLATION OF YOUR SURGERY PATIENT SIGNATURE_________________________________  NURSE SIGNATURE__________________________________  ________________________________________________________________________  WHAT IS A BLOOD TRANSFUSION? Blood Transfusion Information  A transfusion is the replacement of blood or some of its parts. Blood is made up of multiple cells which provide different functions.  Red blood cells carry oxygen and are used for blood loss replacement.  White blood cells fight against infection.  Platelets control bleeding.  Plasma helps clot blood.  Other blood products are available for specialized needs, such as hemophilia or other clotting disorders. BEFORE THE TRANSFUSION  Who gives blood for transfusions?   Healthy volunteers who are fully evaluated to make sure their blood is safe. This is blood bank blood. Transfusion therapy is the safest it has ever been in the practice of medicine. Before blood is taken from a donor, a complete history is taken to make sure that person has no history of diseases nor  engages in risky social behavior (examples are intravenous drug use or sexual activity with multiple partners). The donor's travel history is screened to minimize risk of transmitting infections, such as malaria. The donated blood is tested for signs of infectious diseases, such as HIV and hepatitis. The blood is then tested to be sure it is compatible with you in order to minimize the chance of a transfusion reaction. If you or a relative donates blood, this is often done in anticipation of surgery and is not appropriate for emergency situations. It takes many days to process the donated blood. RISKS AND COMPLICATIONS Although transfusion therapy is very safe and saves many lives, the main dangers of transfusion include:   Getting an infectious disease.  Developing a transfusion reaction. This is an allergic reaction to something in the blood you were given. Every precaution is taken to prevent this. The decision to have a blood transfusion has been considered carefully by your caregiver before blood is given. Blood is not given unless the benefits  outweigh the risks. AFTER THE TRANSFUSION  Right after receiving a blood transfusion, you will usually feel much better and more energetic. This is especially true if your red blood cells have gotten low (anemic). The transfusion raises the level of the red blood cells which carry oxygen, and this usually causes an energy increase.  The nurse administering the transfusion will monitor you carefully for complications. HOME CARE INSTRUCTIONS  No special instructions are needed after a transfusion. You may find your energy is better. Speak with your caregiver about any limitations on activity for underlying diseases you may have. SEEK MEDICAL CARE IF:   Your condition is not improving after your transfusion.  You develop redness or irritation at the intravenous (IV) site. SEEK IMMEDIATE MEDICAL CARE IF:  Any of the following symptoms occur over the next  12 hours:  Shaking chills.  You have a temperature by mouth above 102 F (38.9 C), not controlled by medicine.  Chest, back, or muscle pain.  People around you feel you are not acting correctly or are confused.  Shortness of breath or difficulty breathing.  Dizziness and fainting.  You get a rash or develop hives.  You have a decrease in urine output.  Your urine turns a dark color or changes to pink, red, or brown. Any of the following symptoms occur over the next 10 days:  You have a temperature by mouth above 102 F (38.9 C), not controlled by medicine.  Shortness of breath.  Weakness after normal activity.  The white part of the eye turns yellow (jaundice).  You have a decrease in the amount of urine or are urinating less often.  Your urine turns a dark color or changes to pink, red, or brown. Document Released: 09/01/2000 Document Revised: 11/27/2011 Document Reviewed: 04/20/2008 [facility] Patient Information 2014 [place], [place].  _______________________________________________________________________

## 2016-02-04 ENCOUNTER — Encounter (HOSPITAL_COMMUNITY)
Admission: RE | Admit: 2016-02-04 | Discharge: 2016-02-04 | Disposition: A | Discharge status: Home / Self Care | Payer: Managed Care, Other (non HMO) | Source: Ambulatory Visit | Attending: Urology | Admitting: Urology

## 2016-02-04 ENCOUNTER — Encounter (HOSPITAL_COMMUNITY): Payer: Self-pay

## 2016-02-04 DIAGNOSIS — C61 Malignant neoplasm of prostate: Secondary | ICD-10-CM | POA: Insufficient documentation

## 2016-02-04 DIAGNOSIS — Z0183 Encounter for blood typing: Secondary | ICD-10-CM | POA: Diagnosis not present

## 2016-02-04 DIAGNOSIS — Z01812 Encounter for preprocedural laboratory examination: Secondary | ICD-10-CM | POA: Insufficient documentation

## 2016-02-04 LAB — CBC
HEMATOCRIT: 46.5 % (ref 39.0–52.0)
HEMOGLOBIN: 16 g/dL (ref 13.0–17.0)
MCH: 29.5 pg (ref 26.0–34.0)
MCHC: 34.4 g/dL (ref 30.0–36.0)
MCV: 85.6 fL (ref 78.0–100.0)
Platelets: 239 10*3/uL (ref 150–400)
RBC: 5.43 MIL/uL (ref 4.22–5.81)
RDW: 13.1 % (ref 11.5–15.5)
WBC: 4.9 10*3/uL (ref 4.0–10.5)

## 2016-02-04 LAB — BASIC METABOLIC PANEL
ANION GAP: 7 (ref 5–15)
BUN: 11 mg/dL (ref 6–20)
CHLORIDE: 103 mmol/L (ref 101–111)
CO2: 28 mmol/L (ref 22–32)
Calcium: 9.4 mg/dL (ref 8.9–10.3)
Creatinine, Ser: 0.91 mg/dL (ref 0.61–1.24)
GFR calc Af Amer: >60 mL/min (ref >60)
GLUCOSE: 91 mg/dL (ref 65–99)
POTASSIUM: 4.2 mmol/L (ref 3.5–5.1)
Sodium: 138 mmol/L (ref 135–145)

## 2016-02-04 NOTE — Pre-Procedure Instructions (Signed)
[facility], Dr. [name] 10-28-15 epic PFT 03-23-14 epic Chest CT 07-12-15 epic EKG 08-03-15 epic

## 2016-02-08 ENCOUNTER — Other Ambulatory Visit: Payer: Self-pay | Admitting: Urology

## 2016-02-09 ENCOUNTER — Encounter (HOSPITAL_COMMUNITY)
Admission: RE | Disposition: A | Discharge status: Home / Self Care | Payer: Self-pay | Source: Ambulatory Visit | Attending: Urology

## 2016-02-09 ENCOUNTER — Encounter (HOSPITAL_COMMUNITY): Payer: Self-pay | Admitting: *Deleted

## 2016-02-09 ENCOUNTER — Inpatient Hospital Stay (HOSPITAL_COMMUNITY)
Admission: RE | Admit: 2016-02-09 | Discharge: 2016-02-10 | DRG: 708 | Disposition: A | Discharge status: Home / Self Care | Payer: Managed Care, Other (non HMO) | Source: Ambulatory Visit | Attending: Urology | Admitting: Urology

## 2016-02-09 ENCOUNTER — Inpatient Hospital Stay (HOSPITAL_COMMUNITY): Payer: Managed Care, Other (non HMO) | Admitting: Anesthesiology

## 2016-02-09 DIAGNOSIS — K219 Gastro-esophageal reflux disease without esophagitis: Secondary | ICD-10-CM | POA: Diagnosis present

## 2016-02-09 DIAGNOSIS — Z79899 Other long term (current) drug therapy: Secondary | ICD-10-CM

## 2016-02-09 DIAGNOSIS — Z96651 Presence of right artificial knee joint: Secondary | ICD-10-CM | POA: Diagnosis present

## 2016-02-09 DIAGNOSIS — K660 Peritoneal adhesions (postprocedural) (postinfection): Secondary | ICD-10-CM | POA: Diagnosis present

## 2016-02-09 DIAGNOSIS — J449 Chronic obstructive pulmonary disease, unspecified: Secondary | ICD-10-CM | POA: Diagnosis present

## 2016-02-09 DIAGNOSIS — C61 Malignant neoplasm of prostate: Secondary | ICD-10-CM | POA: Diagnosis present

## 2016-02-09 DIAGNOSIS — Z87891 Personal history of nicotine dependence: Secondary | ICD-10-CM

## 2016-02-09 DIAGNOSIS — I10 Essential (primary) hypertension: Secondary | ICD-10-CM | POA: Diagnosis present

## 2016-02-09 HISTORY — PX: LYMPHADENECTOMY: SHX5960

## 2016-02-09 HISTORY — PX: ROBOT ASSISTED LAPAROSCOPIC RADICAL PROSTATECTOMY: SHX5141

## 2016-02-09 LAB — TYPE AND SCREEN
ABO/RH(D): O POS
Antibody Screen: NEGATIVE

## 2016-02-09 LAB — HEMOGLOBIN AND HEMATOCRIT, BLOOD
HEMATOCRIT: 48.7 % (ref 39.0–52.0)
HEMOGLOBIN: 15.9 g/dL (ref 13.0–17.0)

## 2016-02-09 SURGERY — PROSTATECTOMY, RADICAL, ROBOT-ASSISTED, LAPAROSCOPIC
Anesthesia: General

## 2016-02-09 MED ORDER — LIDOCAINE HCL (CARDIAC) 20 MG/ML IV SOLN
INTRAVENOUS | Status: DC | PRN
  Administered 2016-02-09: 100 mg via INTRAVENOUS

## 2016-02-09 MED ORDER — PANTOPRAZOLE SODIUM 40 MG PO TBEC
40.0000 mg | DELAYED_RELEASE_TABLET | Freq: Every day | ORAL | Status: DC
  Administered 2016-02-09 – 2016-02-10 (×2): 40 mg via ORAL
  Filled 2016-02-09 (×2): qty 1

## 2016-02-09 MED ORDER — MEPERIDINE HCL 50 MG/ML IJ SOLN
INTRAMUSCULAR | Status: AC
  Filled 2016-02-09: qty 1

## 2016-02-09 MED ORDER — HYDROMORPHONE HCL 2 MG/ML IJ SOLN
INTRAMUSCULAR | Status: AC
  Filled 2016-02-09: qty 1

## 2016-02-09 MED ORDER — ONDANSETRON HCL 4 MG/2ML IJ SOLN
INTRAMUSCULAR | Status: DC | PRN
  Administered 2016-02-09: 4 mg via INTRAVENOUS

## 2016-02-09 MED ORDER — SODIUM CHLORIDE 0.9 % IJ SOLN
INTRAMUSCULAR | Status: DC | PRN
  Administered 2016-02-09: 10 mL

## 2016-02-09 MED ORDER — AMLODIPINE BESYLATE 5 MG PO TABS
5.0000 mg | ORAL_TABLET | Freq: Every day | ORAL | Status: DC
  Administered 2016-02-09 – 2016-02-10 (×2): 5 mg via ORAL
  Filled 2016-02-09 (×2): qty 1

## 2016-02-09 MED ORDER — HYDROMORPHONE HCL 1 MG/ML IJ SOLN
0.2500 mg | INTRAMUSCULAR | Status: DC | PRN
  Administered 2016-02-09: 0.25 mg via INTRAVENOUS
  Administered 2016-02-09: 0.5 mg via INTRAVENOUS
  Administered 2016-02-09: 0.25 mg via INTRAVENOUS
  Administered 2016-02-09: 0.5 mg via INTRAVENOUS
  Administered 2016-02-09 (×2): 0.25 mg via INTRAVENOUS

## 2016-02-09 MED ORDER — FENTANYL CITRATE (PF) 250 MCG/5ML IJ SOLN
INTRAMUSCULAR | Status: AC
  Filled 2016-02-09: qty 5

## 2016-02-09 MED ORDER — ROCURONIUM BROMIDE 100 MG/10ML IV SOLN
INTRAVENOUS | Status: DC | PRN
  Administered 2016-02-09: 30 mg via INTRAVENOUS
  Administered 2016-02-09: 20 mg via INTRAVENOUS
  Administered 2016-02-09: 10 mg via INTRAVENOUS
  Administered 2016-02-09: 40 mg via INTRAVENOUS

## 2016-02-09 MED ORDER — LACTATED RINGERS IV SOLN
INTRAVENOUS | Status: DC | PRN
  Administered 2016-02-09 (×4): via INTRAVENOUS

## 2016-02-09 MED ORDER — OXYCODONE HCL 5 MG PO TABS
5.0000 mg | ORAL_TABLET | ORAL | Status: DC | PRN
  Administered 2016-02-09: 5 mg via ORAL
  Filled 2016-02-09: qty 1

## 2016-02-09 MED ORDER — SUGAMMADEX SODIUM 200 MG/2ML IV SOLN
INTRAVENOUS | Status: DC | PRN
  Administered 2016-02-09: 200 mg via INTRAVENOUS

## 2016-02-09 MED ORDER — HYDROCODONE-ACETAMINOPHEN 7.5-325 MG PO TABS: 1.0000 | ORAL_TABLET | Freq: Four times a day (QID) | ORAL | Status: DC | PRN

## 2016-02-09 MED ORDER — HYDROMORPHONE HCL 1 MG/ML IJ SOLN
INTRAMUSCULAR | Status: DC | PRN
  Administered 2016-02-09: 1 mg via INTRAVENOUS
  Administered 2016-02-09: 0.5 mg via INTRAVENOUS

## 2016-02-09 MED ORDER — SULFAMETHOXAZOLE-TRIMETHOPRIM 800-160 MG PO TABS: 1.0000 | ORAL_TABLET | Freq: Two times a day (BID) | ORAL | Status: DC

## 2016-02-09 MED ORDER — FENTANYL CITRATE (PF) 100 MCG/2ML IJ SOLN
INTRAMUSCULAR | Status: DC | PRN
  Administered 2016-02-09: 100 ug via INTRAVENOUS
  Administered 2016-02-09 (×3): 50 ug via INTRAVENOUS

## 2016-02-09 MED ORDER — LABETALOL HCL 5 MG/ML IV SOLN
INTRAVENOUS | Status: DC | PRN
  Administered 2016-02-09 (×2): 2.5 mg via INTRAVENOUS

## 2016-02-09 MED ORDER — DIPHENHYDRAMINE HCL 50 MG/ML IJ SOLN: 12.5000 mg | Freq: Four times a day (QID) | INTRAMUSCULAR | Status: DC | PRN

## 2016-02-09 MED ORDER — ACETAMINOPHEN 500 MG PO TABS
1000.0000 mg | ORAL_TABLET | Freq: Four times a day (QID) | ORAL | Status: AC
  Administered 2016-02-09 – 2016-02-10 (×4): 1000 mg via ORAL
  Filled 2016-02-09 (×4): qty 2

## 2016-02-09 MED ORDER — ALBUTEROL SULFATE (2.5 MG/3ML) 0.083% IN NEBU
2.5000 mg | INHALATION_SOLUTION | RESPIRATORY_TRACT | Status: DC | PRN
Start: 2016-02-09 — End: 2016-02-10

## 2016-02-09 MED ORDER — CLINDAMYCIN PHOSPHATE 600 MG/50ML IV SOLN
600.0000 mg | Freq: Once | INTRAVENOUS | Status: AC
  Administered 2016-02-09: 600 mg via INTRAVENOUS
  Filled 2016-02-09: qty 50

## 2016-02-09 MED ORDER — BUPIVACAINE LIPOSOME 1.3 % IJ SUSP
20.0000 mL | Freq: Once | INTRAMUSCULAR | Status: AC
  Administered 2016-02-09: 20 mL
  Filled 2016-02-09: qty 20

## 2016-02-09 MED ORDER — PROPOFOL 10 MG/ML IV BOLUS
INTRAVENOUS | Status: DC | PRN
  Administered 2016-02-09: 50 mg via INTRAVENOUS
  Administered 2016-02-09: 150 mg via INTRAVENOUS

## 2016-02-09 MED ORDER — TIOTROPIUM BROMIDE MONOHYDRATE 18 MCG IN CAPS
18.0000 ug | ORAL_CAPSULE | Freq: Every day | RESPIRATORY_TRACT | Status: DC
  Filled 2016-02-09: qty 5

## 2016-02-09 MED ORDER — SUFENTANIL CITRATE 50 MCG/ML IV SOLN
INTRAVENOUS | Status: AC
  Filled 2016-02-09: qty 1

## 2016-02-09 MED ORDER — PROPOFOL 10 MG/ML IV BOLUS
INTRAVENOUS | Status: AC
  Filled 2016-02-09: qty 20

## 2016-02-09 MED ORDER — SUGAMMADEX SODIUM 200 MG/2ML IV SOLN
INTRAVENOUS | Status: AC
  Filled 2016-02-09: qty 2

## 2016-02-09 MED ORDER — BELLADONNA ALKALOIDS-OPIUM 16.2-60 MG RE SUPP: 1.0000 | Freq: Four times a day (QID) | RECTAL | Status: DC | PRN

## 2016-02-09 MED ORDER — ONDANSETRON HCL 4 MG/2ML IJ SOLN: 4.0000 mg | INTRAMUSCULAR | Status: DC | PRN

## 2016-02-09 MED ORDER — DEXAMETHASONE SODIUM PHOSPHATE 10 MG/ML IJ SOLN
INTRAMUSCULAR | Status: DC | PRN
  Administered 2016-02-09: 10 mg via INTRAVENOUS

## 2016-02-09 MED ORDER — MEPERIDINE HCL 50 MG/ML IJ SOLN: 6.2500 mg | INTRAMUSCULAR | Status: DC | PRN

## 2016-02-09 MED ORDER — HYDROMORPHONE HCL 1 MG/ML IJ SOLN
INTRAMUSCULAR | Status: AC
  Filled 2016-02-09: qty 1

## 2016-02-09 MED ORDER — CIPROFLOXACIN IN D5W 400 MG/200ML IV SOLN
INTRAVENOUS | Status: AC
  Filled 2016-02-09: qty 200

## 2016-02-09 MED ORDER — SODIUM CHLORIDE 0.9 % IV BOLUS (SEPSIS)
1000.0000 mL | Freq: Once | INTRAVENOUS | Status: AC
  Administered 2016-02-09: 1000 mL via INTRAVENOUS

## 2016-02-09 MED ORDER — ROCURONIUM BROMIDE 50 MG/5ML IV SOLN
INTRAVENOUS | Status: AC
  Filled 2016-02-09: qty 1

## 2016-02-09 MED ORDER — STERILE WATER FOR IRRIGATION IR SOLN
Status: DC | PRN
  Administered 2016-02-09: 1000 mL

## 2016-02-09 MED ORDER — MIDAZOLAM HCL 2 MG/2ML IJ SOLN
INTRAMUSCULAR | Status: AC
  Filled 2016-02-09: qty 2

## 2016-02-09 MED ORDER — SODIUM CHLORIDE 0.9 % IJ SOLN
INTRAMUSCULAR | Status: AC
  Filled 2016-02-09: qty 20

## 2016-02-09 MED ORDER — DIPHENHYDRAMINE HCL 12.5 MG/5ML PO ELIX: 12.5000 mg | ORAL_SOLUTION | Freq: Four times a day (QID) | ORAL | Status: DC | PRN

## 2016-02-09 MED ORDER — SUFENTANIL CITRATE 50 MCG/ML IV SOLN
INTRAVENOUS | Status: DC | PRN
  Administered 2016-02-09 (×5): 10 ug via INTRAVENOUS

## 2016-02-09 MED ORDER — DEXTROSE-NACL 5-0.45 % IV SOLN
INTRAVENOUS | Status: DC
  Administered 2016-02-09 – 2016-02-10 (×2): via INTRAVENOUS

## 2016-02-09 MED ORDER — METOCLOPRAMIDE HCL 5 MG/ML IJ SOLN: 10.0000 mg | Freq: Once | INTRAMUSCULAR | Status: DC | PRN

## 2016-02-09 MED ORDER — DEXAMETHASONE SODIUM PHOSPHATE 10 MG/ML IJ SOLN
INTRAMUSCULAR | Status: AC
Start: 2016-02-09 — End: 2016-02-09
  Filled 2016-02-09: qty 1

## 2016-02-09 MED ORDER — CIPROFLOXACIN IN D5W 400 MG/200ML IV SOLN
INTRAVENOUS | Status: DC | PRN
  Administered 2016-02-09: 400 mg via INTRAVENOUS

## 2016-02-09 MED ORDER — LABETALOL HCL 5 MG/ML IV SOLN
INTRAVENOUS | Status: AC
  Filled 2016-02-09: qty 4

## 2016-02-09 MED ORDER — SUCCINYLCHOLINE CHLORIDE 20 MG/ML IJ SOLN
INTRAMUSCULAR | Status: DC | PRN
  Administered 2016-02-09: 100 mg via INTRAVENOUS

## 2016-02-09 MED ORDER — SENNOSIDES-DOCUSATE SODIUM 8.6-50 MG PO TABS
1.0000 | ORAL_TABLET | Freq: Two times a day (BID) | ORAL | Status: DC
  Administered 2016-02-09 – 2016-02-10 (×2): 1 via ORAL
  Filled 2016-02-09 (×2): qty 1

## 2016-02-09 MED ORDER — LACTATED RINGERS IR SOLN
Status: DC | PRN
  Administered 2016-02-09: 1

## 2016-02-09 MED ORDER — CIPROFLOXACIN IN D5W 400 MG/200ML IV SOLN: 400.0000 mg | Freq: Once | INTRAVENOUS | Status: DC

## 2016-02-09 MED ORDER — ACETAMINOPHEN 10 MG/ML IV SOLN
INTRAVENOUS | Status: DC | PRN
Start: 2016-02-09 — End: 2016-02-09
  Administered 2016-02-09: 1000 mg via INTRAVENOUS

## 2016-02-09 MED ORDER — OXYCODONE HCL 5 MG PO TABS: 10.0000 mg | ORAL_TABLET | ORAL | Status: DC | PRN

## 2016-02-09 MED ORDER — HYDROMORPHONE HCL 1 MG/ML IJ SOLN
0.5000 mg | INTRAMUSCULAR | Status: DC | PRN
  Administered 2016-02-09 – 2016-02-10 (×2): 1 mg via INTRAVENOUS
  Filled 2016-02-09 (×2): qty 1

## 2016-02-09 MED ORDER — MIDAZOLAM HCL 5 MG/5ML IJ SOLN
INTRAMUSCULAR | Status: DC | PRN
  Administered 2016-02-09: 2 mg via INTRAVENOUS

## 2016-02-09 MED ORDER — LIDOCAINE HCL (CARDIAC) 20 MG/ML IV SOLN
INTRAVENOUS | Status: AC
  Filled 2016-02-09: qty 5

## 2016-02-09 MED ORDER — ONDANSETRON HCL 4 MG/2ML IJ SOLN
INTRAMUSCULAR | Status: AC
  Filled 2016-02-09: qty 2

## 2016-02-09 SURGICAL SUPPLY — 61 items
APPLICATOR COTTON TIP 6IN STRL (MISCELLANEOUS) ×1 IMPLANT
CATH FOLEY 2WAY SLVR 18FR 30CC (CATHETERS) ×4 IMPLANT
CATH TIEMANN FOLEY 18FR 5CC (CATHETERS) ×4 IMPLANT
CHLORAPREP W/TINT 26ML (MISCELLANEOUS) ×4 IMPLANT
CLIP LIGATING HEM O LOK PURPLE (MISCELLANEOUS) ×12 IMPLANT
CLOTH BEACON ORANGE TIMEOUT ST (SAFETY) ×4 IMPLANT
CONT SPECI 4OZ STER CLIK (MISCELLANEOUS) ×4 IMPLANT
COVER SURGICAL LIGHT HANDLE (MISCELLANEOUS) ×4 IMPLANT
COVER TIP SHEARS 8 DVNC (MISCELLANEOUS) ×2 IMPLANT
COVER TIP SHEARS 8MM DA VINCI (MISCELLANEOUS) ×2
CUTTER ECHEON FLEX ENDO 45 340 (ENDOMECHANICALS) ×4 IMPLANT
DECANTER SPIKE VIAL GLASS SM (MISCELLANEOUS) ×1 IMPLANT
DRAPE ARM DVNC X/XI (DISPOSABLE) ×8 IMPLANT
DRAPE COLUMN DVNC XI (DISPOSABLE) ×2 IMPLANT
DRAPE DA VINCI XI ARM (DISPOSABLE) ×8
DRAPE DA VINCI XI COLUMN (DISPOSABLE) ×2
DRSG TEGADERM 4X4.75 (GAUZE/BANDAGES/DRESSINGS) ×6 IMPLANT
DRSG TEGADERM 6X8 (GAUZE/BANDAGES/DRESSINGS) ×2 IMPLANT
ELECT REM PT RETURN 9FT ADLT (ELECTROSURGICAL) ×4
ELECTRODE REM PT RTRN 9FT ADLT (ELECTROSURGICAL) ×2 IMPLANT
GAUZE SPONGE 2X2 8PLY STRL LF (GAUZE/BANDAGES/DRESSINGS) ×1 IMPLANT
GLOVE BIO SURGEON STRL SZ 6.5 (GLOVE) ×3 IMPLANT
GLOVE BIO SURGEONS STRL SZ 6.5 (GLOVE) ×1
GLOVE BIOGEL M STRL SZ7.5 (GLOVE) ×8 IMPLANT
GLOVE BIOGEL PI IND STRL 7.5 (GLOVE) ×2 IMPLANT
GLOVE BIOGEL PI INDICATOR 7.5 (GLOVE) ×2
GOWN STRL REUS W/TWL LRG LVL3 (GOWN DISPOSABLE) ×8 IMPLANT
GOWN STRL REUS W/TWL LRG LVL4 (GOWN DISPOSABLE) ×12 IMPLANT
HOLDER FOLEY CATH W/STRAP (MISCELLANEOUS) ×4 IMPLANT
IV LACTATED RINGERS 1000ML (IV SOLUTION) ×2 IMPLANT
KIT PROCEDURE DA VINCI SI (MISCELLANEOUS) ×2
KIT PROCEDURE DVNC SI (MISCELLANEOUS) ×2 IMPLANT
LIQUID BAND (GAUZE/BANDAGES/DRESSINGS) ×4 IMPLANT
NDL INSUFFLATION 14GA 120MM (NEEDLE) ×2 IMPLANT
NDL SPNL 22GX7 QUINCKE BK (NEEDLE) ×1 IMPLANT
NEEDLE INSUFFLATION 14GA 120MM (NEEDLE) ×4 IMPLANT
NEEDLE SPNL 22GX7 QUINCKE BK (NEEDLE) ×4 IMPLANT
PACK ROBOT UROLOGY CUSTOM (CUSTOM PROCEDURE TRAY) ×4 IMPLANT
PAD POSITIONING PINK XL (MISCELLANEOUS) IMPLANT
RELOAD GREEN ECHELON 45 (STAPLE) ×4 IMPLANT
SEAL CANN UNIV 5-8 DVNC XI (MISCELLANEOUS) ×9 IMPLANT
SEAL XI 5MM-8MM UNIVERSAL (MISCELLANEOUS) ×10
SET TUBE IRRIG SUCTION NO TIP (IRRIGATION / IRRIGATOR) ×4 IMPLANT
SHEET LAVH (DRAPES) IMPLANT
SOLUTION ELECTROLUBE (MISCELLANEOUS) ×4 IMPLANT
SPONGE GAUZE 2X2 STER 10/PKG (GAUZE/BANDAGES/DRESSINGS)
SPONGE LAP 4X18 X RAY DECT (DISPOSABLE) ×4 IMPLANT
SUT ETHILON 3 0 PS 1 (SUTURE) ×4 IMPLANT
SUT MNCRL AB 4-0 PS2 18 (SUTURE) ×8 IMPLANT
SUT PDS AB 1 CT1 27 (SUTURE) ×8 IMPLANT
SUT V-LOC BARB 180 2/0GR6 GS22 (SUTURE) ×4
SUT VIC AB 2-0 SH 27 (SUTURE) ×4
SUT VIC AB 2-0 SH 27X BRD (SUTURE) ×2 IMPLANT
SUT VICRYL 0 UR6 27IN ABS (SUTURE) ×4 IMPLANT
SUT VLOC BARB 180 ABS3/0GR12 (SUTURE) ×12
SUTURE V-LC BRB 180 2/0GR6GS22 (SUTURE) IMPLANT
SUTURE VLOC BRB 180 ABS3/0GR12 (SUTURE) ×6 IMPLANT
SYR 27GX1/2 1ML LL SAFETY (SYRINGE) ×4 IMPLANT
TOWEL OR 17X26 10 PK STRL BLUE (TOWEL DISPOSABLE) ×4 IMPLANT
TOWEL OR NON WOVEN STRL DISP B (DISPOSABLE) ×4 IMPLANT
WATER STERILE IRR 1500ML POUR (IV SOLUTION) ×2 IMPLANT

## 2016-02-09 NOTE — Progress Notes (Signed)
Pt complained of pain in groin area near catheter insertion site, irrigated manually, no clots scene. Pt expressed relief after irrigation. Conception Oms, RN

## 2016-02-09 NOTE — Anesthesia Postprocedure Evaluation (Signed)
Anesthesia Post Note  Patient: AVI TROTTI  Procedure(s) Performed: Procedure(s) (LRB): XI ROBOTIC ASSISTED LAPAROSCOPIC RADICAL PROSTATECTOMY WITH INDOCYANINE GREEN DYE AND ADHESIOLYSIS (N/A) PELVIC LYMPHADENECTOMY (Bilateral)  Patient location during evaluation: PACU Anesthesia Type: General Level of consciousness: awake and alert Pain management: pain level controlled Vital Signs Assessment: post-procedure vital signs reviewed and stable Respiratory status: spontaneous breathing, nonlabored ventilation, respiratory function stable and patient connected to nasal cannula oxygen Cardiovascular status: blood pressure returned to baseline and stable Postop Assessment: no signs of nausea or vomiting Anesthetic complications: no    Last Vitals:  Filed Vitals:   02/09/16 1018 02/09/16 1700  BP: 138/92 166/70  Pulse: 106 81  Temp: 36.7 C 36.7 C  Resp: 18 16    Last Pain: There were no vitals filed for this visit.               Zenaida Deed

## 2016-02-09 NOTE — Op Note (Signed)
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  PROCEDURES: 1. Robotic-assisted laparoscopic prostatectomy. 2. Bilateral pelvic lymphadenectomy. 3. Injection of indocyanine green dye for sentinel lymphangiography. 4. Limited laparoscopic adhesiolysis.  ESTIMATED BLOOD LOSS:  250 mL.  COMPLICATIONS:  None.  SPECIMENS: 1. Periprostatic fat. 2. Radical prostatectomy. 3. Right external iliac lymph nodes. 4. Right obturator lymph nodes. 5. Left external iliac lymph nodes. 6. Left obturator lymph nodes.  DRAINS: 1. Jackson-Pratt drain to bulb suction. 2. Foley catheter to straight drain.  ASSISTANT:  Debbrah Alar, PA.  FINDINGS: 1. Minimal adhesions in the area of prior umbilical hernia repair. 2. Left greater than right pelvic adhesions in the area of prior     bilateral inguinal hernia repair, this did require at least 30     minutes of dedicated adhesiolysis. 3. No evidence of hyperfluorescent sentinel lymph nodes within the     pelvic lymph node fields.  INDICATION:  [name] is a pleasant 65 year old gentleman, who was found on workup of elevated PSA to have most of a large volume, mixed of low and moderate-risk prostate cancer.  Options were discussed for management including surveillance protocols versus ablative therapies versus surgical extirpation with and without minimally invasive assistance.  The patient wished to proceed with robotic prostatectomy. Informed consent was obtained and placed in the medical record.  We discussed preoperatively given the history of multiple hernia repairs, adhesiolysis was certainly possible, he also underwent preoperative CT scan,  which did reveal some concern of bowel loops and direct apposition especially in the umbilical area.  We discussed the possibility of abdominal adhesiolysis with increased risks of bowel injury with this anatomy.  PROCEDURE IN DETAIL:  The patient being [name], was verified. Procedure being robotic prostatectomy with bilateral pelvic lymphadenectomy, probable adhesiolysis was confirmed.  Procedure was carried out.  Time-out was performed.  Intravenous antibiotics were administered.  General endotracheal anesthesia was introduced.  The patient was placed into a low lithotomy position and sterile field was created by prepping and draping the patient's penis, perineum, and proximal thighs using iodine in his infra-xiphoid abdomen using chlorhexidine gluconate.  After he was clipper shaved, he was further fashioned to the operating table using 3-inch tape over foam padding.  A test of steep Trendelenburg positioning was performed, he was found to be suitably positioned.  Foley catheter was placed per urethra to straight drain.  Next, a high-flow, low-pressure pneumoperitoneum was obtained using Veress technique in the right upper quadrant having passed the aspiration and drop test.  This position was chosen based on review of his CAT scan and this appeared to be a window without significant adhesions.  An 8-mm robotic port was placed in this location.  Laparoscopic examination of the peritoneal cavity via this site revealed amazingly no significant adhesions in the area there was prior umbilical hernia repair.  There were  significant adhesions, left greater than right in the area there were prior inguinal hernia repairs. Additional ports were then placed as follows:  Right far lateral AirSeal port, right paramedian 8-mm robotic port, a supraumbilical midline 8-mm robotic port, left paramedian 8-mm robotic port and left far lateral 8- mm robotic port.  Robot was docked and passed  through electronic checks. Initial attention was directed at adhesiolysis, mostly loose attachments were taken down between the sigmoid and peri-epiploic fat of the sigmoid in the left internal ring area and thus allowing an access to the area of future lymphadenectomy on the left.  Great care was taken to avoid bowel injury, which did not occur.  A similar less involved adhesiolysis was performed on the right side, had some small bowel loops that were in apposition to the internal ring on the right side mostly at their mesenteric level.  This took approximately 30 minutes.  Attention was then directed at development of space of Retzius.  Incision was made lateral to the left medial umbilical ligament from the midline towards the area of the internal ring and coursing along the area of the iliac vessels towards the iliac bifurcation.  The vas deferens was encountered and purposely ligated, used as a bucket-handle for gentle medial traction and the left bladder wall was carefully dissected away from the lateral aspect of the pelvis towards the area of the endopelvic fascia. A mirror-imaged dissection was performed on the right side mobilizing the right medial umbilical ligament, right bladder away from the pelvic sidewall.  Anterior attachments were taken down with cautery scissors exposing the anterior base of the prostate, which was defatted allowing better visualization of the bladder neck and prostate base junction, this fatty tissue was set aside and labeled as periprostatic fat.  Next, 0.2 mL of indocyanine green dye was injected into each lobe of the prostate using percutaneously placed robotic guided 18-gauge needle with intervening, suctioning to prevent dye spillage, which did not occur. Next, the endopelvic fascia was carefully swept away from the lateral aspect of the prostate in a base-to-apex orientation bilaterally.  This exposed the dorsal venous complex, which was  controlled using vascular load stapler taking great care to avoid membranous urethral injury, which did not occur.  It had been approximately 10 minutes post dye injection and the pelvis was inspected under near-infrared fluorescence light.  Sentinel lymphangiography revealed good dye uptake of the prostatic parenchyma with several lymphatic channels seeing coursing over the area of the prostate and bladder neck.  There were, however, not any obvious hyperfluorescent lymph nodes within the pelvic lymph node fields after the area of the aortic bifurcation.  As such, standard template lymphadenectomy was performed on the right side.  First, the right external iliac lymph nodes with the confines being right external iliac artery, vein, pelvic sidewall and iliac bifurcation.  Lymphostasis was achieved with cold clips, set aside and labeled right external iliac lymph nodes.  Next, the right obturator lymph node packet was carefully dissected with the confines being external iliac vein, pelvic sidewall and obturator nerve.  Lymphostasis was again achieved with cold clips, set aside, labeled right obturator lymph nodes.  A mirror-imaged lymphadenectomy was performed on the left side of left external iliac and left obturator lymph nodes respectively.  The obturator nerve was inspected following these maneuvers bilaterally and found to be uninjured.  Attention was then directed at bladder neck dissection and the bladder neck was identified in the anterior plane moving the Foley catheter back  and forth and the lateral release was performed and the bladder neck was carefully separated from the base of the prostate anterior to posterior direction keeping what appeared to be circular fibers with each side of the specimen and avoiding excessive bladder neck caliber, which did not occur.  Posterior dissection was performed by incising approximately 7 mm inferior-posterior to the posterior lip of the  prostate and entering the plane of Denonvilliers.  Bilateral vas deferens were encountered and dissected for distance approximately 4 cm, placed on gentle superior traction.  Bilateral seminal vesicles were also dissected to their tip and placed on gentle superior traction. Dissection proceeded within this plane towards the area of the apex of the prostate posteriorly, this exposed the vascular pedicles on each side.  Given the patient's preoperative impotence and large volume disease, a non-nerve-sparing approach was chosen and wide resection was performed of the vascular pedicles from the base to the apex of the prostate using a sequential clipping technique.  Apical dissection was performed in the anterior plane by placing the prostate on gentle superior traction and transecting coldly the membranous urethra keeping a minimal amount of urethral stump given bilateral positive apical prior biopsy, simply freed up the prostatectomy specimen, which was placed into an EndoCatch bag for later retrieval.  Digital rectal exam was performed under laparoscopic vision with indicator glove and no evidence of rectal violation was noted.  Next, posterior reconstruction was performed using a 3-0 V-Loc suture reapproximating the posterior urethral plate to the posterior bladder neck tissue bringing these structures in a tension-free apposition.  Next, mucosa-to-mucosa anastomosis was performed of the bladder neck and urethra using double- armed V-Loc suture from the 6 o'clock to 12 o'clock position.  New Foley catheter was then placed, which irrigated quantitatively.  At this point, all sponge and needle counts were correct.  Hemostasis appeared excellent.  A closed suction drain was brought through the previous left lateral robotic port site to the area of the peritoneal cavity.  Robot was then undocked.  The previous right lateral most assistant port site was closed at the level of the fascia using  Carter-Thomason suture passer and 0 Vicryl under laparoscopic vision.  Specimen was retrieved by extending the previous midline camera port site superiorly for distance approximately 3 cm removing the prostatectomy specimen, setting aside for permanent pathology.  Given this area, we chosen as it was somewhat away from the area of prior umbilical hernia repair.  Given the propensity of hernias, the extraction site was closed at the level of fascia using figure-of-eight Prolene x3 followed by reapproximation of Scarpa's using running Vicryl.  All incision sites were infiltrated with dilute lyophilized Marcaine and closed at the level of the skin using subcuticular Monocryl followed by Dermabond and procedure was then terminated.  The patient tolerated the procedure well.  There were no immediate periprocedural complications.  The patient was taken to the postanesthesia care unit in stable condition.          ______________________________ [name], MD     TM/MEDQ  D:  02/09/2016  T:  02/09/2016  Job:  [id number]

## 2016-02-09 NOTE — Anesthesia Procedure Notes (Signed)
Procedure Name: Intubation Date/Time: 02/09/2016 12:32 PM Performed by: Maxwell Caul Pre-anesthesia Checklist: Patient identified, Emergency Drugs available, Suction available and Patient being monitored Patient Re-evaluated:Patient Re-evaluated prior to inductionOxygen Delivery Method: Circle System Utilized Preoxygenation: Pre-oxygenation with 100% oxygen Intubation Type: IV induction Ventilation: Mask ventilation without difficulty Laryngoscope Size: Mac and 4 Grade View: Grade I Tube type: Oral Tube size: 8.0 mm Number of attempts: 1 Airway Equipment and Method: Stylet and Oral airway Placement Confirmation: ETT inserted through vocal cords under direct vision,  positive ETCO2 and breath sounds checked- equal and bilateral Secured at: 22 cm Tube secured with: Tape Dental Injury: Teeth and Oropharynx as per pre-operative assessment

## 2016-02-09 NOTE — Transfer of Care (Signed)
Immediate Anesthesia Transfer of Care Note  Patient: [name]  Procedure(s) Performed: Procedure(s): XI ROBOTIC ASSISTED LAPAROSCOPIC RADICAL PROSTATECTOMY WITH INDOCYANINE GREEN DYE AND ADHESIOLYSIS (N/A) PELVIC LYMPHADENECTOMY (Bilateral)  Patient Location: PACU  Anesthesia Type:General  Level of Consciousness: awake, alert , oriented and patient cooperative  Airway & Oxygen Therapy: Patient Spontanous Breathing and Patient connected to face mask oxygen  Post-op Assessment: Report given to RN, Post -op Vital signs reviewed and stable and Patient moving all extremities X 4  Post vital signs: stable  Last Vitals:  Filed Vitals:   02/09/16 1018  BP: 138/92  Pulse: 106  Temp: 36.7 C  Resp: 18    Last Pain: There were no vitals filed for this visit.       Complications: No apparent anesthesia complications

## 2016-02-09 NOTE — Discharge Instructions (Signed)
1. Activity:  You are encouraged to ambulate frequently (about every hour during waking hours) to help prevent blood clots from forming in your legs or lungs.  However, you should not engage in any heavy lifting (> 10-15 lbs), strenuous activity, or straining. °2. Diet: You should continue a clear liquid diet until passing gas from below.  Once this occurs, you may advance your diet to a soft diet that would be easy to digest (i.e soups, scrambled eggs, mashed potatoes, etc.) for 24 hours just as you would if getting over a bad stomach flu.  If tolerating this diet well for 24 hours, you may then begin eating regular food.  It will be normal to have some amount of bloating, nausea, and abdominal discomfort intermittently. °3. Prescriptions:  You will be provided a prescription for pain medication to take as needed.  If your pain is not severe enough to require the prescription pain medication, you may take Tylenol instead.  You should also take an over the counter stool softener (Colace 100 mg twice daily) to avoid straining with bowel movements as the pain medication may constipate you. Finally, you will also be provided a prescription for an antibiotic to begin the day prior to your return visit in the office for catheter removal. °4. Catheter care: You will be taught how to take care of the catheter by the nursing staff prior to discharge from the hospital.  You may use both a leg bag and the larger bedside bag but it is recommended to at least use the bigger bedside bag at nighttime as the leg bag is small and will fill up overnight and also does not drain as well when lying flat. You may periodically feel a strong urge to void with the catheter in place.  This is a bladder spasm and most often can occur when having a bowel movement or when you are moving around. It is typically self-limited and usually will stop after a few minutes.  You may use some Vaseline or Neosporin around the tip of the catheter to  reduce friction at the tip of the penis. °5. Incisions: You may remove your dressing bandages the 2nd day after surgery.  You most likely will have a few small staples in each of the incisions and once the bandages are removed, the incisions may stay open to air.  You may start showering (not soaking or bathing in water) 48 hours after surgery and the incisions simply need to be patted dry after the shower.  No additional care is needed. °6. What to call us about: You should call the office ([phone]) if you develop fever > 101, persistent vomiting, or the catheter stops draining. Also, feel free to call with any other questions you may have and remember the handout that was provided to you as a reference preoperatively which answers many of the common questions that arise after surgery. ° °You may resume aspirin, advil, aleve, vitamins, and supplements 7 days after surgery. °

## 2016-02-09 NOTE — Anesthesia Preprocedure Evaluation (Addendum)
Anesthesia Evaluation  Patient identified by MRN, date of birth, ID band Patient awake    Reviewed: Allergy & Precautions, NPO status , Patient's Chart, lab work & pertinent test results  Airway Mallampati: II  TM Distance: >3 FB Neck ROM: Full    Dental  (+) Poor Dentition,    Pulmonary COPD,  COPD inhaler, former smoker,    Pulmonary exam normal breath sounds clear to auscultation       Cardiovascular hypertension, Pt. on medications Normal cardiovascular exam Rhythm:Regular Rate:Normal     Neuro/Psych negative neurological ROS     GI/Hepatic Neg liver ROS, GERD  ,  Endo/Other  negative endocrine ROS  Renal/GU negative Renal ROS     Musculoskeletal  (+) Arthritis ,   Abdominal (+) + obese,   Peds  Hematology negative hematology ROS (+)   Anesthesia Other Findings   Reproductive/Obstetrics                            Lab Results  Component Value Date   WBC 4.9 02/04/2016   HGB 16.0 02/04/2016   HCT 46.5 02/04/2016   MCV 85.6 02/04/2016   PLT 239 02/04/2016   Lab Results  Component Value Date   CREATININE 0.91 02/04/2016   BUN 11 02/04/2016   NA 138 02/04/2016   K 4.2 02/04/2016   CL 103 02/04/2016   CO2 28 02/04/2016    Anesthesia Physical Anesthesia Plan  ASA: II  Anesthesia Plan: General   Post-op Pain Management:    Induction: Intravenous  Airway Management Planned: Oral ETT  Additional Equipment:   Intra-op Plan:   Post-operative Plan: Extubation in OR  Informed Consent: I have reviewed the patients History and Physical, chart, labs and discussed the procedure including the risks, benefits and alternatives for the proposed anesthesia with the patient or authorized representative who has indicated his/her understanding and acceptance.   Dental advisory given  Plan Discussed with: CRNA  Anesthesia Plan Comments:         Anesthesia Quick  Evaluation

## 2016-02-09 NOTE — H&P (Signed)
[name] is an 65 y.o. male.    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 TRUS 25mL with small median lobe. PSA 4.89.  PMH sig for Rt knee replacement, bilateral inguinal hernia repair with mesh, umbilical hernia repair with mesh. No ischemic CV disesae / blood thinners. His PCP is Silvestre Mesi MD.   Today " [name] " is seen to proceed with robotic prostatecotmy. Interval CT with loss of fat planes near umbilicus raising suspicion of adhesions in that area.     Past Medical History  Diagnosis Date  . Allergy     seasonal  . GERD (gastroesophageal reflux disease)   . Hypertension   . Arthritis     oa  . Shortness of breath dyspnea   . Cancer [facility])     prostate    Past Surgical History  Procedure Laterality Date  . Knee surgery Left july 2014    mcl and meniscus repair  . Hernia repair  last done 3-4 yrs ago    x 2  . Inguinal hernia repair Right 11/20/2013    Procedure: OPEN REPAIR OF RECURRENT RIGHT INGUINAL HERNIA WITH MESH;  Surgeon: [name]. [name], MD;  Location: [facility];  Service: General;  Laterality: Right;  . Insertion of mesh Right 11/20/2013    Procedure: INSERTION OF MESH;  Surgeon: [name]. [name], MD;  Location: [facility];  Service: General;  Laterality: Right;  . Right knee arthroscopy     . Total knee arthroplasty Right 09/06/2015    Procedure: RIGHT TOTAL KNEE ARTHROPLASTY;  Surgeon: [name], MD;  Location: [facility];  Service: Orthopedics;  Laterality: Right;    Family History  Problem Relation Age of Onset  . Cancer Mother   . Cancer Father   . Cancer Sister   . Arthritis Sister    Social History:  reports that he quit smoking about 16 years ago. His smoking use included Cigarettes. He has a 30 pack-year smoking history. He has never used  smokeless tobacco. He reports that he uses illicit drugs (Cocaine). He reports that he does not drink alcohol.  Allergies:  Allergies  Allergen Reactions  . Percocet [Oxycodone-Acetaminophen] Other (See Comments)    Nausea-Doesn't work  . Amoxicillin Rash    Has patient had a PCN reaction causing immediate rash, facial/tongue/throat swelling, SOB or lightheadedness with hypotension: No Has patient had a PCN reaction causing severe rash involving mucus membranes or skin necrosis: No Has patient had a PCN reaction that required hospitalization No Has patient had a PCN reaction occurring within the last 10 years: Yes- 2016 If all of the above answers are "NO", then may proceed with Cephalosporin use.     No prescriptions prior to admission    No results found for this or any previous visit (from the past 48 hour(s)). No results found.  Review of Systems  Constitutional: Negative.  Negative for fever, chills and malaise/fatigue.  HENT: Negative.   Eyes: Negative.   Respiratory: Negative.   Cardiovascular: Negative.   Gastrointestinal: Negative.  Negative for nausea and vomiting.  Genitourinary: Negative.  Negative for hematuria and flank pain.  Musculoskeletal: Negative.   Skin: Negative.   Neurological: Negative.   Endo/Heme/Allergies: Negative.   Psychiatric/Behavioral: Negative.     There were no vitals taken for this  visit. Physical Exam  Constitutional: He appears well-developed.  HENT:  Head: Normocephalic.  Eyes: Pupils are equal, round, and reactive to light.  Neck: Normal range of motion.  Cardiovascular: Normal rate.   Respiratory: Effort normal.  GI:  Old peri-umbilical scar w/o hernia defect at present  Genitourinary: Penis normal.  No CVAT  Musculoskeletal: Normal range of motion.  Neurological: He is alert.  Skin: Skin is warm.  Psychiatric: He has a normal mood and affect. His behavior is normal. Judgment normal.     Assessment/Plan   1 - Moderate  Risk Prostate Cancer - significant disease in man with minimal comorbidity. Agree primary therapy warranted. He would likely do well with surgery or radiation. Frankly discussed sexual implications of primary surgery and that I would recommend a purposefully wide resection / non-nerve sparing given his bilateral apical and lateral positivity. He states cancer control is top priority and understands potency implications.   We rediscussed prostatectomy and specifically robotic prostatectomy with bilateral pelvic lymphadenectomy being the technique that I most commonly perform. I showed the patient on their abdomen the approximately 6 small incision (trocar) sites as well as presumed extraction sites with robotic approach as well as possible open incision sites should open conversion be necessary. We rediscussed peri-operative risks including bleeding, infection, deep vein thrombosis, pulmonary embolism, compartment syndrome, nuropathy / neuropraxia, heart attack, stroke, death, as well as long-term risks such as non-cure / need for additional therapy. We specifically readdressed that the procedure would compromise urinary control leading to stress incontinence which typically resolves with time and pelvic rehabilitation (Kegel's, etc..), but can sometimes be permanent and require additional therapy including surgery. We also specifically readdressed sexual sequellae including significant erectile dysfunction which typically partially resolves with time but can also be permanent and require additional therapy including surgery.   We rediscussed the typical hospital course including usual 1-2 night hospitalization, discharge with foley catheter in place usually for 1-2 weeks before voiding trial as well as usually 2 week recovery until able to perform most non-strenuous activity and 6 weeks until able to return to most jobs and more strenuous activity such as exercise.   Also specifically discussed implicaiotns of  likely adhesions from prior hernia repair with need for possible adhesiolysis and increase risk of bowel injury / ileus, but that special precaustions would be taken to minimize this.   [name], MD 02/09/2016, 6:13 AM

## 2016-02-09 NOTE — Brief Op Note (Signed)
02/09/2016  4:29 PM  PATIENT:  [name]  65 y.o. male  PRE-OPERATIVE DIAGNOSIS:  MODERATE RISK PROSTATE CANCER  POST-OPERATIVE DIAGNOSIS:  MODERATE RISK PROSTATE CANCER  PROCEDURE:  Procedure(s): XI ROBOTIC ASSISTED LAPAROSCOPIC RADICAL PROSTATECTOMY WITH INDOCYANINE GREEN DYE AND ADHESIOLYSIS (N/A) PELVIC LYMPHADENECTOMY (Bilateral)  SURGEON:  Surgeon(s) and Role:    * [name], MD - Primary  PHYSICIAN ASSISTANT:   ASSISTANTS: Debbrah Alar PA   ANESTHESIA:   general  EBL:  Total I/O In: 2000 [I.V.:2000] Out: 100 [Blood:100]  BLOOD ADMINISTERED:none  DRAINS: 1 - JP to bulb, 2 - foley to gravity   LOCAL MEDICATIONS USED:  MARCAINE     SPECIMEN:  Source of Specimen:  1 - prostatectomy, 2 - pelvic lymph nodes, 3- periprostatic fat  DISPOSITION OF SPECIMEN:  PATHOLOGY  COUNTS:  YES  TOURNIQUET:  * No tourniquets in log *  DICTATION: .Other Dictation: Dictation Number [id number]  PLAN OF CARE: Admit to inpatient   PATIENT DISPOSITION:  PACU - hemodynamically stable.   Delay start of Pharmacological VTE agent (>24hrs) due to surgical blood loss or risk of bleeding: yes

## 2016-02-10 ENCOUNTER — Encounter (HOSPITAL_COMMUNITY): Payer: Self-pay | Admitting: Urology

## 2016-02-10 LAB — BASIC METABOLIC PANEL
ANION GAP: 5 (ref 5–15)
BUN: 11 mg/dL (ref 6–20)
CALCIUM: 8.3 mg/dL — AB (ref 8.9–10.3)
CO2: 26 mmol/L (ref 22–32)
Chloride: 106 mmol/L (ref 101–111)
Creatinine, Ser: 0.92 mg/dL (ref 0.61–1.24)
Glucose, Bld: 146 mg/dL — ABNORMAL HIGH (ref 65–99)
POTASSIUM: 4.6 mmol/L (ref 3.5–5.1)
Sodium: 137 mmol/L (ref 135–145)

## 2016-02-10 LAB — HEMOGLOBIN AND HEMATOCRIT, BLOOD
HEMATOCRIT: 44 % (ref 39.0–52.0)
Hemoglobin: 14.3 g/dL (ref 13.0–17.0)

## 2016-02-10 LAB — CREATININE, FLUID (PLEURAL, PERITONEAL, JP DRAINAGE): CREAT FL: 0.8 mg/dL

## 2016-02-10 NOTE — Care Management Note (Signed)
Case Management Note  Patient Details  Name: JIHO ECKART MRN: [id number] Date of Birth: [birth year]  Subjective/Objective:64 y/o m admitted w/Prostate Ca. POD#1 rob lap prostatectomy. From home.                    Action/Plan:d/c plan home.   Expected Discharge Date:                  Expected Discharge Plan:  Home/Self Care  In-House Referral:     Discharge planning Services  CM Consult  Post Acute Care Choice:    Choice offered to:     DME Arranged:    DME Agency:     HH Arranged:    HH Agency:     Status of Service:  In process, will continue to follow  Medicare Important Message Given:    Date Medicare IM Given:    Medicare IM give by:    Date Additional Medicare IM Given:    Additional Medicare Important Message give by:     If discussed at [place] of Stay Meetings, dates discussed:    Additional Comments:  Dessa Phi, RN 02/10/2016, 1:08 PM

## 2016-02-10 NOTE — Progress Notes (Signed)
Completed D/C teaching with patient. Gave prescriptions. Demonstrated foley bag care. Answered questions. Pt will be D/C home with family in stable condition.

## 2016-02-10 NOTE — Care Management Note (Signed)
Case Management Note  Patient Details  Name: [name] MRN: [id number] Date of Birth: Feb 09, 1951  Subjective/Objective:                    Action/Plan:d/c home no needs or orders.   Expected Discharge Date:                  Expected Discharge Plan:  Home/Self Care  In-House Referral:     Discharge planning Services  CM Consult  Post Acute Care Choice:    Choice offered to:     DME Arranged:    DME Agency:     HH Arranged:    [place] Agency:     Status of Service:  Completed, signed off  Medicare Important Message Given:    Date Medicare IM Given:    Medicare IM give by:    Date Additional Medicare IM Given:    Additional Medicare Important Message give by:     If discussed at [place] of Stay Meetings, dates discussed:    Additional Comments:  Dessa Phi, RN 02/10/2016, 2:23 PM

## 2016-02-10 NOTE — Progress Notes (Signed)
1 Day Post-Op Subjective: Patient reports tolerating PO and pain control good. Has walked in halls. AF, VSS. Excellent UOP. JP put out 280 cc but only 115 cc since MN. Hgb and creatinine stable.    Objective: Vital signs in last 24 hours: Temp:  [97.6 F (36.4 C)-98.8 F (37.1 C)] 98.8 F (37.1 C) (05/25 0548) Pulse Rate:  [81-106] 85 (05/25 0548) Resp:  [12-20] 18 (05/25 0548) BP: (117-166)/(64-92) 117/73 mmHg (05/25 0548) SpO2:  [94 %-100 %] 96 % (05/25 0548) Weight:  [97.977 kg (216 lb)] 97.977 kg (216 lb) (05/24 1117)  Intake/Output from previous day: 05/24 0701 - 05/25 0700 In: 3200 [I.V.:3200] Out: 3080 [Urine:2700; Drains:280; Blood:100] Intake/Output this shift:    Physical Exam:  General:alert, cooperative and appears stated age GI: Soft, non-distended, appropriately tender to palpation, incisions C/D/I. JP with SS output.   Male genitalia: Foley catheter in place with clear yellow urine without clots Resp: No increased WOB on RA Extremities: extremities normal, atraumatic, no cyanosis or edema  Lab Results:  Recent Labs  02/09/16 1658 02/10/16 0500  HGB 15.9 14.3  HCT 48.7 44.0   BMET  Recent Labs  02/10/16 0500  NA 137  K 4.6  CL 106  CO2 26  GLUCOSE 146*  BUN 11  CREATININE 0.92  CALCIUM 8.3*   No results for input(s): LABPT, INR in the last 72 hours. No results for input(s): LABURIN in the last 72 hours. Results for orders placed or performed during the hospital encounter of 08/27/15  Surgical pcr screen     Status: None   Collection Time: 08/27/15 10:54 AM  Result Value Ref Range Status   MRSA, PCR NEGATIVE NEGATIVE Final   Staphylococcus aureus NEGATIVE NEGATIVE Final    Comment:        The Xpert SA Assay (FDA approved for NASAL specimens in patients over 76 years of age), is one component of a comprehensive surveillance program.  Test performance has been validated by [facility] for patients greater than or equal to 4 year  old. It is not intended to diagnose infection nor to guide or monitor treatment.     Studies/Results: No results found.  Assessment/Plan: 1 Day Post-Op Procedure(s) (LRB): XI ROBOTIC ASSISTED LAPAROSCOPIC RADICAL PROSTATECTOMY WITH INDOCYANINE GREEN DYE AND ADHESIOLYSIS (N/A) PELVIC LYMPHADENECTOMY (Bilateral)    Regular diet  [place]  Ambulate  PO pain medications  Follow up JP output  Check JP creatinine this AM  Possible PM discharge   LOS: 1 day   Acie Fredrickson 02/10/2016, 7:30 AM   I have seen and examined the pt and agree with above as outlined here and by DC summary dated same date.

## 2016-02-10 NOTE — Discharge Summary (Signed)
Physician Discharge Summary  Patient ID: [name] MRN: [id number] DOB/AGE: [birth year] 65 y.o.  Admit date: 02/09/2016 Discharge date: 02/10/2016  Admission Diagnoses: Prostate cancer  Discharge Diagnoses:  Active Problems:   Prostate cancer [facility])   Discharged Condition: good  Hospital Course:  65 yo male who is s/p robotic assisted laparoscopic prostatectomy and bilateral pelvic lymph node dissection. He did well post-operatively. His diet was slowly advanced and at the time of discharge he was tolerating a regular diet, ambulating at his baseline, having excellent UOP via foley catheter, JP drain was successfully removed after JP Cr returned equal to serum, and pain was well controlled with oral narcotics. He was discharged to home on POD#1.  Consults: None  Significant Diagnostic Studies: labs: JP Cr: 0.8  Treatments: surgery: robotic assisted laparoscopic prostatectomy and bilateral pelvic lymph node dissection  Discharge Exam: Blood pressure 140/80, pulse 90, temperature 98.2 F (36.8 C), temperature source Oral, resp. rate 18, height 5\' 11"  (1.803 m), weight 97.977 kg (216 lb), SpO2 97 %. General:alert, cooperative and appears stated age GI: Soft, non-distended, appropriately tender to palpation, incisions C/D/I. JP out.  Male genitalia: Foley catheter in place with clear yellow urine without clots Resp: No increased WOB on RA Extremities: extremities normal, atraumatic, no cyanosis or edema  Disposition: 01-Home or Self Care     Medication List    STOP taking these medications        sildenafil 20 MG tablet  Commonly known as:  REVATIO     tadalafil 20 MG tablet  Commonly known as:  CIALIS     Vitamin D3 2000 units Tabs      TAKE these medications        albuterol 108 (90 Base) MCG/ACT inhaler  Commonly known as:  PROVENTIL HFA;VENTOLIN HFA  Inhale 2 puffs into the lungs every 4 (four) hours as needed for wheezing or shortness of breath.      amLODipine 5 MG tablet  Commonly known as:  NORVASC  Take 1 tablet (5 mg total) by mouth daily.     clobetasol ointment 0.05 %  Commonly known as:  TEMOVATE  Apply topically 2 (two) times daily. Apply bid to itchy rash, do not use on face, genitals, axillae     dexlansoprazole 60 MG capsule  Commonly known as:  DEXILANT  Take 1 capsule (60 mg total) by mouth daily.     docusate sodium 100 MG capsule  Commonly known as:  COLACE  Take 1 capsule (100 mg total) by mouth 2 (two) times daily.     fluticasone 50 MCG/ACT nasal spray  Commonly known as:  FLONASE  Place 2 sprays into both nostrils daily.     HYDROcodone-acetaminophen 7.5-325 MG tablet  Commonly known as:  NORCO  Take 1-2 tablets by mouth every 6 (six) hours as needed for moderate pain.     methocarbamol 500 MG tablet  Commonly known as:  ROBAXIN  Take 1 tablet (500 mg total) by mouth every 6 (six) hours as needed for muscle spasms.     OPCON-A 0.027-0.315 % Soln  Generic drug:  Naphazoline-Pheniramine  Apply 1 drop to eye 2 (two) times daily as needed (dry/irritated eyes).     polyethylene glycol packet  Commonly known as:  MIRALAX / GLYCOLAX  Take 17 g by mouth 2 (two) times daily.     SALONPAS ARTHRITIS PAIN RELIEF Pads  Apply 1 each topically daily as needed (Pain).     sulfamethoxazole-trimethoprim 800-160 MG tablet  Commonly known as:  BACTRIM DS,SEPTRA DS  Take 1 tablet by mouth 2 (two) times daily. Start the day prior to foley removal appointment     tiotropium 18 MCG inhalation capsule  Commonly known as:  SPIRIVA  Place 1 capsule (18 mcg total) into inhaler and inhale daily.     trolamine salicylate 10 % cream  Commonly known as:  ASPERCREME  Apply 1 application topically as needed for muscle pain.       Follow-up Information    Follow up with [name], MD On 02/24/2016.   Specialty:  Urology   Why:  at 11:45 for MD visit and catheter removal   Contact information:   [place] [address] [phone]       Signed: Acie Fredrickson 02/10/2016, 4:49 PM   I have seen and examined the patient and agree with above assesment and plan.  Briefly,  S: POD 1 s/p robotic prostatectomy and bilateral inguinal hernia repair. Pain controlled, ambulatory, maintaining good PO intake.   O: NAD, AOx3 RRR Non-labored breathing on room air Recent surgical sites c/d/i, no hernias Foley c/d/i with yellow urine, no clots No c/c/e  A/P: 1 - DC Home

## 2016-02-22 ENCOUNTER — Other Ambulatory Visit: Payer: Self-pay | Admitting: Internal Medicine

## 2016-03-20 ENCOUNTER — Encounter: Payer: Self-pay | Admitting: Physician Assistant

## 2016-03-20 DIAGNOSIS — N393 Stress incontinence (female) (male): Secondary | ICD-10-CM | POA: Insufficient documentation

## 2016-05-08 ENCOUNTER — Ambulatory Visit: Payer: Self-pay | Admitting: Emergency Medicine

## 2016-05-17 ENCOUNTER — Other Ambulatory Visit: Payer: Self-pay | Admitting: Physician Assistant

## 2016-06-12 ENCOUNTER — Telehealth: Payer: Self-pay

## 2016-06-12 ENCOUNTER — Ambulatory Visit (INDEPENDENT_AMBULATORY_CARE_PROVIDER_SITE_OTHER): Payer: Managed Care, Other (non HMO) | Admitting: Family Medicine

## 2016-06-12 VITALS — BP 136/86 | HR 73 | Temp 98.0°F | Resp 17 | Ht 71.0 in | Wt 208.0 lb

## 2016-06-12 DIAGNOSIS — K219 Gastro-esophageal reflux disease without esophagitis: Secondary | ICD-10-CM

## 2016-06-12 DIAGNOSIS — M6208 Separation of muscle (nontraumatic), other site: Secondary | ICD-10-CM

## 2016-06-12 MED ORDER — DEXLANSOPRAZOLE 60 MG PO CPDR: DELAYED_RELEASE_CAPSULE | ORAL | 1 refills | Status: DC

## 2016-06-12 NOTE — Telephone Encounter (Signed)
Dr Carlota Raspberry,   Patient is requesting information on heartburn.    [phone]

## 2016-06-12 NOTE — Progress Notes (Signed)
By signing my name below I, Tereasa Coop, attest that this documentation has been prepared under the direction and in the presence of [name], MD. Electonically Signed. Tereasa Coop, Scribe 06/12/2016 at 2:14 PM  Subjective:    Patient ID: [name], male    DOB: 01-Oct-1950, 65 y.o.   MRN: [id number]  Chief Complaint  Patient presents with  . Medication Refill    Dexilant    HPI [name] is a 65 y.o. male who presents to the [facility] for dexilant prescription refill. Pt takes dexilant once a day for his GERD. Pt denies history of Barrett's esophagus or gastric ulcers. Pt's last endoscopy was approximately 8 years ago per pt. Pt denies any blood in stool or dark tarry stool.   History of GERD, HTN, tobacco use, COPD, prostate cancer, status post robotic assisted prostatectomy in May 2017. Pt reports his last PSA was negative.  Last physical was by Dr Elder Cyphers in November 2016.  Patient Active Problem List   Diagnosis Date Noted  . SUI (stress urinary incontinence), male 03/20/2016  . Prostate cancer ([place]) 02/09/2016  . Obese 09/08/2015  . S/P right TKA 09/06/2015  . S/P knee replacement 09/06/2015  . COPD, mild ([place]) 07/20/2014  . Pulmonary nodules 02/23/2014  . Tobacco use disorder, moderate, in sustained remission 02/23/2014  . Recurrent right inguinal hernia 10/20/2013  . ED (erectile dysfunction) 08/26/2012  . Physical exam, annual 11/05/2011  . HTN (hypertension) 11/05/2011  . GERD (gastroesophageal reflux disease) 11/05/2011  . Chronic joint pain 11/05/2011   Past Medical History:  Diagnosis Date  . Allergy    seasonal  . Arthritis    oa  . GERD (gastroesophageal reflux disease)   . Hypertension   . Prostate cancer ([place]) 01/2016   prostatectomy   . Shortness of breath dyspnea    Past Surgical History:  Procedure Laterality Date  . HERNIA REPAIR  last done 3-4 yrs ago   x 2  . INGUINAL HERNIA REPAIR Right 11/20/2013     Procedure: OPEN REPAIR OF RECURRENT RIGHT INGUINAL HERNIA WITH MESH;  Surgeon: [name]. [name], MD;  Location: [facility];  Service: General;  Laterality: Right;  . INSERTION OF MESH Right 11/20/2013   Procedure: INSERTION OF MESH;  Surgeon: [name]. [name], MD;  Location: [facility];  Service: General;  Laterality: Right;  . KNEE SURGERY Left july 2014   mcl and meniscus repair  . LYMPHADENECTOMY Bilateral 02/09/2016   Procedure: PELVIC LYMPHADENECTOMY;  Surgeon: [name], MD;  Location: [facility];  Service: Urology;  Laterality: Bilateral;  . right knee arthroscopy     . ROBOT ASSISTED LAPAROSCOPIC RADICAL PROSTATECTOMY N/A 02/09/2016   Procedure: XI ROBOTIC ASSISTED LAPAROSCOPIC RADICAL PROSTATECTOMY WITH INDOCYANINE GREEN DYE AND ADHESIOLYSIS;  Surgeon: [name], MD;  Location: [facility];  Service: Urology;  Laterality: N/A;  . TOTAL KNEE ARTHROPLASTY Right 09/06/2015   Procedure: RIGHT TOTAL KNEE ARTHROPLASTY;  Surgeon: [name], MD;  Location: [facility];  Service: Orthopedics;  Laterality: Right;   Allergies  Allergen Reactions  . Percocet [Oxycodone-Acetaminophen] Other (See Comments)    Nausea-Doesn't work  . Amoxicillin Rash    Has patient had a PCN reaction causing immediate rash, facial/tongue/throat swelling, SOB or lightheadedness with hypotension: No Has patient had a PCN reaction causing severe rash involving mucus membranes or skin necrosis: No Has patient had a PCN reaction that required hospitalization No Has patient had a PCN reaction occurring within  the last 10 years: Yes- 2016 If all of the above answers are "NO", then may proceed with Cephalosporin use.    Prior to Admission medications   Medication Sig Start Date End Date Taking? Authorizing Provider  albuterol (PROVENTIL HFA;VENTOLIN HFA) 108 (90 BASE) MCG/ACT inhaler Inhale 2 puffs into the lungs every 4 (four) hours as needed for wheezing or shortness of breath. 07/20/14  Yes Collene Gobble, MD  amLODipine  (NORVASC) 5 MG tablet Take 1 tablet (5 mg total) by mouth daily. 08/03/15  Yes Orma Flaming, MD  clobetasol ointment (TEMOVATE) 0.05 % Apply topically 2 (two) times daily. Apply bid to itchy rash, do not use on face, genitals, axillae Patient taking differently: Apply 1 application topically 2 (two) times daily as needed (Apply to itchy rash, do not use on face, genitals, axillae.).  02/24/13  Yes Orma Flaming, MD  DEXILANT 60 MG capsule TAKE 1 CAPSULE (60 MG TOTAL) BY MOUTH DAILY. 02/23/16  Yes Chelle Jeffery, PA-C  docusate sodium (COLACE) 100 MG capsule Take 1 capsule (100 mg total) by mouth 2 (two) times daily. Patient taking differently: Take 100 mg by mouth 2 (two) times daily as needed (For constipation.).  09/08/15  Yes Matthew Babish, PA-C  fluticasone (FLONASE) 50 MCG/ACT nasal spray Place 2 sprays into both nostrils daily. Patient taking differently: Place 2 sprays into both nostrils daily as needed for allergies.  08/03/15  Yes Orma Flaming, MD  HYDROcodone-acetaminophen [facility]) 7.5-325 MG tablet Take 1-2 tablets by mouth every 6 (six) hours as needed for moderate pain. 02/09/16  Yes Amanda Dancy, PA-C  Liniments [facility] ARTHRITIS PAIN RELIEF) PADS Apply 1 each topically daily as needed (Pain).   Yes Historical Provider, MD  methocarbamol (ROBAXIN) 500 MG tablet Take 1 tablet (500 mg total) by mouth every 6 (six) hours as needed for muscle spasms. 09/08/15  Yes Matthew Babish, PA-C  Naphazoline-Pheniramine (OPCON-A) 0.027-0.315 % SOLN Apply 1 drop to eye 2 (two) times daily as needed (dry/irritated eyes).   Yes Historical Provider, MD  polyethylene glycol (MIRALAX / GLYCOLAX) packet Take 17 g by mouth 2 (two) times daily. Patient taking differently: Take 17 g by mouth 2 (two) times daily as needed (For constipation.).  09/08/15  Yes Danae Orleans, PA-C  sulfamethoxazole-trimethoprim (BACTRIM DS,SEPTRA DS) 800-160 MG tablet Take 1 tablet by mouth 2 (two) times daily. Start the day prior to  foley removal appointment 02/09/16  Yes Debbrah Alar, PA-C  tiotropium (SPIRIVA) 18 MCG inhalation capsule Place 1 capsule (18 mcg total) into inhaler and inhale daily. 08/09/15  Yes Collene Gobble, MD  trolamine salicylate (ASPERCREME) 10 % cream Apply 1 application topically as needed for muscle pain.   Yes Historical Provider, MD   Social History   Social History  . Marital status: Married    Spouse name: N/A  . Number of children: N/A  . Years of education: N/A   Occupational History  . Not on file.   Social History Main Topics  . Smoking status: Former Smoker    Packs/day: 1.50    Years: 20.00    Types: Cigarettes    Quit date: 11/05/1999  . Smokeless tobacco: Never Used  . Alcohol use No     Comment: last used 25 years ago   . Drug use:     Types: Cocaine     Comment: last used cocaine  and acid 25 yrs ago,none used since  . Sexual activity: Not on file   Other  Topics Concern  . Not on file   Social History Narrative  . No narrative on file      Review of Systems  Constitutional: Negative for fatigue and unexpected weight change.  Eyes: Negative for visual disturbance.  Respiratory: Negative for cough, chest tightness and shortness of breath.   Cardiovascular: Negative for chest pain, palpitations and leg swelling.  Gastrointestinal: Negative for abdominal pain and blood in stool.  Neurological: Negative for dizziness, light-headedness and headaches.       Objective:   Physical Exam  Constitutional: He is oriented to person, place, and time. He appears well-developed and well-nourished. No distress.  HENT:  Head: Normocephalic and atraumatic.  Eyes: Conjunctivae and EOM are normal. Pupils are equal, round, and reactive to light.  Neck: Neck supple. No JVD present. Carotid bruit is not present.  Cardiovascular: Normal rate, regular rhythm and normal heart sounds.   No murmur heard. Pulmonary/Chest: Effort normal and breath sounds normal. He has no rales.    Abdominal: Soft. Normal appearance. There is no tenderness. There is no rebound, no guarding and no CVA tenderness.  Pt has a diastasis at his mid abd, no hernia at the umbilicus.   Musculoskeletal: Normal range of motion. He exhibits no edema.  Neurological: He is alert and oriented to person, place, and time.  Skin: Skin is warm and dry.  Psychiatric: He has a normal mood and affect. His behavior is normal.  Nursing note and vitals reviewed.   Vitals:   06/12/16 1358  BP: 136/86  Pulse: 73  Resp: 17  Temp: 98 F (36.7 C)  TempSrc: Oral  SpO2: 98%  Weight: 208 lb (94.3 kg)  Height: 5\' 11"  (1.803 m)         Assessment & Plan:   [name] is a 65 y.o. male Rectus diastasis  - Fitted with physical therapy. Denies any pain, denies any hernia to this area, previous umbilical repair. Appears to be diastasis recti, not true midline hernia, but can follow-up to discuss further if symptomatic. ER/RTC hernia precautions were discussed.  Gastroesophageal reflux disease, esophagitis presence not specified  - Trigger avoidance measures discussed, with episodic Dexilant use if tolerated.   - Dexilant refilled   Advised to follow-up within the next 3 months for physical and to establish with myself or other new primary care provider at our office.     Meds ordered this encounter  Medications  . dexlansoprazole (DEXILANT) 60 MG capsule    Sig: TAKE 1 CAPSULE (60 MG TOTAL) BY MOUTH DAILY.    Dispense:  90 capsule    Refill:  1    No more refills without office visit   Patient Instructions   Okay to continue Dexilant as needed up to once per day, but try to avoid the foods as listed below that can trigger the heartburn. If you do not have any symptoms off of the medication, you can try going every day or 2 without taking that medication. If you do have breakthrough symptoms, return to taking it once per day. Follow-up within the next 3 months for a physical with myself or  another new primary provider at our office. Sooner if any new concerns.   The defect on your abdominal wall may be something called a diastasis rectus, which is not of concern, but if any increased swelling, pain or the swelling does not go away, be seen here or the emergency room right away.  Food Choices for Gastroesophageal Reflux Disease,  Adult When you have gastroesophageal reflux disease (GERD), the foods you eat and your eating habits are very important. Choosing the right foods can help ease the discomfort of GERD. WHAT GENERAL GUIDELINES DO I NEED TO FOLLOW?  Choose fruits, vegetables, whole grains, low-fat dairy products, and low-fat meat, fish, and poultry.  Limit fats such as oils, salad dressings, butter, nuts, and avocado.  Keep a food diary to identify foods that cause symptoms.  Avoid foods that cause reflux. These may be different for different people.  Eat frequent small meals instead of three large meals each day.  Eat your meals slowly, in a relaxed setting.  Limit fried foods.  Cook foods using methods other than frying.  Avoid drinking alcohol.  Avoid drinking large amounts of liquids with your meals.  Avoid bending over or lying down until 2-3 hours after eating. WHAT FOODS ARE NOT RECOMMENDED? The following are some foods and drinks that may worsen your symptoms: Vegetables Tomatoes. Tomato juice. Tomato and spaghetti sauce. Chili peppers. Onion and garlic. Horseradish. Fruits Oranges, grapefruit, and lemon (fruit and juice). Meats High-fat meats, fish, and poultry. This includes hot dogs, ribs, ham, sausage, salami, and bacon. Dairy Whole milk and chocolate milk. Sour cream. Cream. Butter. Ice cream. Cream cheese.  Beverages Coffee and tea, with or without caffeine. Carbonated beverages or energy drinks. Condiments Hot sauce. Barbecue sauce.  Sweets/Desserts Chocolate and cocoa. Donuts. Peppermint and spearmint. Fats and Oils High-fat foods,  including [place] fries and potato chips. Other Vinegar. Strong spices, such as black pepper, white pepper, red pepper, cayenne, curry powder, cloves, ginger, and chili powder. The items listed above may not be a complete list of foods and beverages to avoid. Contact your dietitian for more information.   This information is not intended to replace advice given to you by your health care provider. Make sure you discuss any questions you have with your health care provider.   Document Released: 09/04/2005 Document Revised: 09/25/2014 Document Reviewed: 07/09/2013 Elsevier Interactive Patient Education 2016 [employer].     IF you received an x-ray today, you will receive an invoice from [facility]. Please contact [facility] at [phone] with questions or concerns regarding your invoice.   IF you received labwork today, you will receive an invoice from [employer]. Please contact [facility] at [phone] with questions or concerns regarding your invoice.   Our billing staff will not be able to assist you with questions regarding bills from these companies.  You will be contacted with the lab results as soon as they are available. The fastest way to get your results is to activate your My Chart account. Instructions are located on the last page of this paperwork. If you have not heard from [place] regarding the results in 2 weeks, please contact this office.         I personally performed the services described in this documentation, which was scribed in my presence. The recorded information has been reviewed and considered, and addended by me as needed.   Signed,   [name], MD [facility].  06/12/16 2:42 PM

## 2016-06-12 NOTE — Patient Instructions (Addendum)
Okay to continue Dexilant as needed up to once per day, but try to avoid the foods as listed below that can trigger the heartburn. If you do not have any symptoms off of the medication, you can try going every day or 2 without taking that medication. If you do have breakthrough symptoms, return to taking it once per day. Follow-up within the next 3 months for a physical with myself or another new primary provider at our office. Sooner if any new concerns.   The defect on your abdominal wall may be something called a diastasis rectus, which is not of concern, but if any increased swelling, pain or the swelling does not go away, be seen here or the emergency room right away.  Food Choices for Gastroesophageal Reflux Disease, Adult When you have gastroesophageal reflux disease (GERD), the foods you eat and your eating habits are very important. Choosing the right foods can help ease the discomfort of GERD. WHAT GENERAL GUIDELINES DO I NEED TO FOLLOW?  Choose fruits, vegetables, whole grains, low-fat dairy products, and low-fat meat, fish, and poultry.  Limit fats such as oils, salad dressings, butter, nuts, and avocado.  Keep a food diary to identify foods that cause symptoms.  Avoid foods that cause reflux. These may be different for different people.  Eat frequent small meals instead of three large meals each day.  Eat your meals slowly, in a relaxed setting.  Limit fried foods.  Cook foods using methods other than frying.  Avoid drinking alcohol.  Avoid drinking large amounts of liquids with your meals.  Avoid bending over or lying down until 2-3 hours after eating. WHAT FOODS ARE NOT RECOMMENDED? The following are some foods and drinks that may worsen your symptoms: Vegetables Tomatoes. Tomato juice. Tomato and spaghetti sauce. Chili peppers. Onion and garlic. Horseradish. Fruits Oranges, grapefruit, and lemon (fruit and juice). Meats High-fat meats, fish, and poultry. This  includes hot dogs, ribs, ham, sausage, salami, and bacon. Dairy Whole milk and chocolate milk. Sour cream. Cream. Butter. Ice cream. Cream cheese.  Beverages Coffee and tea, with or without caffeine. Carbonated beverages or energy drinks. Condiments Hot sauce. Barbecue sauce.  Sweets/Desserts Chocolate and cocoa. Donuts. Peppermint and spearmint. Fats and Oils High-fat foods, including [place] fries and potato chips. Other Vinegar. Strong spices, such as black pepper, white pepper, red pepper, cayenne, curry powder, cloves, ginger, and chili powder. The items listed above may not be a complete list of foods and beverages to avoid. Contact your dietitian for more information.   This information is not intended to replace advice given to you by your health care provider. Make sure you discuss any questions you have with your health care provider.   Document Released: 09/04/2005 Document Revised: 09/25/2014 Document Reviewed: 07/09/2013 Elsevier Interactive Patient Education 2016 [employer].     IF you received an x-ray today, you will receive an invoice from [facility]. Please contact [facility] Radiology at [phone] with questions or concerns regarding your invoice.   IF you received labwork today, you will receive an invoice from [employer]. Please contact [facility] at [phone] with questions or concerns regarding your invoice.   Our billing staff will not be able to assist you with questions regarding bills from these companies.  You will be contacted with the lab results as soon as they are available. The fastest way to get your results is to activate your My Chart account. Instructions are located on the last page of this paperwork.  If you have not heard from [place] regarding the results in 2 weeks, please contact this office.

## 2016-06-13 NOTE — Telephone Encounter (Signed)
Did he look at the info on his AVS on heartburn and food?  I can send info again if needed.

## 2016-06-14 NOTE — Telephone Encounter (Signed)
Attempted to call pt. No answer voice mail left to return call.

## 2016-06-21 NOTE — Telephone Encounter (Signed)
Attempted to call pt, left VM

## 2016-06-26 NOTE — Telephone Encounter (Signed)
Sent unable to reach letter with Dr Vonna Kotyk message.

## 2016-07-03 ENCOUNTER — Ambulatory Visit: Payer: Self-pay | Admitting: Emergency Medicine

## 2016-07-24 ENCOUNTER — Ambulatory Visit: Payer: Self-pay | Admitting: Emergency Medicine

## 2016-08-03 ENCOUNTER — Encounter: Payer: Managed Care, Other (non HMO) | Admitting: Family Medicine

## 2016-08-04 ENCOUNTER — Other Ambulatory Visit: Payer: Self-pay | Admitting: Internal Medicine

## 2016-08-04 DIAGNOSIS — I10 Essential (primary) hypertension: Secondary | ICD-10-CM

## 2016-08-04 DIAGNOSIS — J301 Allergic rhinitis due to pollen: Secondary | ICD-10-CM

## 2016-08-07 ENCOUNTER — Ambulatory Visit (INDEPENDENT_AMBULATORY_CARE_PROVIDER_SITE_OTHER): Payer: Managed Care, Other (non HMO) | Admitting: Family Medicine

## 2016-08-07 VITALS — BP 126/80 | HR 81 | Temp 98.1°F | Resp 18 | Ht 71.0 in | Wt 216.2 lb

## 2016-08-07 DIAGNOSIS — R21 Rash and other nonspecific skin eruption: Secondary | ICD-10-CM | POA: Diagnosis not present

## 2016-08-07 DIAGNOSIS — Z23 Encounter for immunization: Secondary | ICD-10-CM | POA: Diagnosis not present

## 2016-08-07 DIAGNOSIS — J301 Allergic rhinitis due to pollen: Secondary | ICD-10-CM

## 2016-08-07 DIAGNOSIS — T7840XD Allergy, unspecified, subsequent encounter: Secondary | ICD-10-CM | POA: Diagnosis not present

## 2016-08-07 DIAGNOSIS — Z0000 Encounter for general adult medical examination without abnormal findings: Secondary | ICD-10-CM | POA: Diagnosis not present

## 2016-08-07 DIAGNOSIS — Z13220 Encounter for screening for lipoid disorders: Secondary | ICD-10-CM

## 2016-08-07 DIAGNOSIS — K219 Gastro-esophageal reflux disease without esophagitis: Secondary | ICD-10-CM | POA: Diagnosis not present

## 2016-08-07 DIAGNOSIS — I10 Essential (primary) hypertension: Secondary | ICD-10-CM

## 2016-08-07 DIAGNOSIS — L299 Pruritus, unspecified: Secondary | ICD-10-CM

## 2016-08-07 MED ORDER — AMLODIPINE BESYLATE 5 MG PO TABS: 5.0000 mg | ORAL_TABLET | Freq: Every day | ORAL | 3 refills | Status: DC

## 2016-08-07 MED ORDER — FLUTICASONE PROPIONATE 50 MCG/ACT NA SUSP: 2.0000 | Freq: Every day | NASAL | 6 refills | Status: DC | PRN

## 2016-08-07 MED ORDER — ZOSTER VACCINE LIVE 19400 UNT/0.65ML ~~LOC~~ SUSR: 0.6500 mL | Freq: Once | SUBCUTANEOUS | 0 refills | Status: AC

## 2016-08-07 MED ORDER — TRIAMCINOLONE ACETONIDE 0.1 % EX CREA: 1.0000 "application " | TOPICAL_CREAM | Freq: Two times a day (BID) | CUTANEOUS | 1 refills | Status: DC | PRN

## 2016-08-07 NOTE — Patient Instructions (Addendum)
Return within the next 10 days for fasting blood work. You will not need an appointment, just return and let them know you are here for blood work only.  No change in medications for now, continue acid blocker only if needed, and try the new steroid cream that is slightly milder than your previous prescription. If you have a very mild itching area,  you can always try over-the-counter cortisone.   If any change in the bump on the side of your tongue, or other new symptoms, return to discuss those further.  I prescribed the shingles vaccine to fill at your pharmacy if you would like to have it and not too costly.   Keeping you healthy  Get these tests  Blood pressure- Have your blood pressure checked once a year by your healthcare provider.  Normal blood pressure is 120/80  Weight- Have your body mass index (BMI) calculated to screen for obesity.  BMI is a measure of body fat based on height and weight. You can also calculate your own BMI at [employer].  Cholesterol- Have your cholesterol checked every year.  Diabetes- Have your blood sugar checked regularly if you have high blood pressure, high cholesterol, have a family history of diabetes or if you are overweight.  Screening for Colon Cancer- Colonoscopy starting at age 63.  Screening may begin sooner depending on your family history and other health conditions. Follow up colonoscopy as directed by your Gastroenterologist.  Screening for Prostate Cancer- Both blood work (PSA) and a rectal exam help screen for Prostate Cancer.  Screening begins at age 87 with African-American men and at age 49 with Caucasian men.  Screening may begin sooner depending on your family history.  Take these medicines  Aspirin- One aspirin daily can help prevent Heart disease and Stroke.  Flu shot- Every fall.  Tetanus- Every 10 years.  Zostavax- Once after the age of 65 to prevent Shingles.  Pneumonia shot- Once after the age of 85; if  you are younger than 32, ask your healthcare provider if you need a Pneumonia shot.  Take these steps  Don't smoke- If you do smoke, talk to your doctor about quitting.  For tips on how to quit, go to www.smokefree.gov or call 1-800-QUIT-NOW.  Be physically active- Exercise 5 days a week for at least 30 minutes.  If you are not already physically active start slow and gradually work up to 30 minutes of moderate physical activity.  Examples of moderate activity include walking briskly, mowing the yard, dancing, swimming, bicycling, etc.  Eat a healthy diet- Eat a variety of healthy food such as fruits, vegetables, low fat milk, low fat cheese, yogurt, lean meant, poultry, fish, beans, tofu, etc. For more information go to www.thenutritionsource.org  Drink alcohol in moderation- Limit alcohol intake to less than two drinks a day. Never drink and drive.  Dentist- Brush and floss twice daily; visit your dentist twice a year.  Depression- Your emotional health is as important as your physical health. If you're feeling down, or losing interest in things you would normally enjoy please talk to your healthcare provider.  Eye exam- Visit your eye doctor every year.  Safe sex- If you may be exposed to a sexually transmitted infection, use a condom.  Seat belts- Seat belts can save your life; always wear one.  Smoke/Carbon Monoxide detectors- These detectors need to be installed on the appropriate level of your home.  Replace batteries at least once a year.  Skin cancer- When out  in the sun, cover up and use sunscreen 15 SPF or higher.  Violence- If anyone is threatening you, please tell your healthcare provider.  Living Will/ Health care power of attorney- Speak with your healthcare provider and family.  IF you received an x-ray today, you will receive an invoice from [facility]. Please contact [facility]iology at [phone] with questions or concerns regarding your invoice.    IF you received labwork today, you will receive an invoice from [employer]. Please contact [facility] at [phone] with questions or concerns regarding your invoice.   Our billing staff will not be able to assist you with questions regarding bills from these companies.  You will be contacted with the lab results as soon as they are available. The fastest way to get your results is to activate your My Chart account. Instructions are located on the last page of this paperwork. If you have not heard from [place] regarding the results in 2 weeks, please contact this office.

## 2016-08-07 NOTE — Progress Notes (Signed)
Subjective:  By signing my name below, I, [name], attest that this documentation has been prepared under the direction and in the presence of [name], MD Electronically Signed: Ladene Artist, ED Scribe 08/07/2016 at 10:29 AM.   Patient ID: [name], male    DOB: [birth year], 65 y.o.   MRN: [id number]  Chief Complaint  Patient presents with  . Annual Exam    CPE   HPI  HPI Comments: [name] is a 65 y.o. male who presents to the [facility] for an annual exam. H/o prostate CA and underwent prostatectomy by Dr. [name] Feb 09, 2016. H/o HTN, GERD, COPD with h/o pulmonary nodules (benign based on stability for 2 years based on note by Dr. [name] Feb. 2017) and chronic joint pain.  CA Screening Colonoscopy: ~8-9 years ago, normal.  Prostate CA as above. Urologist Dr. [name]; next appointment in 2 weeks.  Per CPE survey, pt reports urinary frequency.   Immunizations  Immunization History  Administered Date(s) Administered  . Influenza Split 06/18/2013  . Influenza,inj,Quad PF,36+ Mos 08/03/2015  . Influenza-Unspecified 07/13/2014  Pt received his flu vaccine at work ~1 month ago. (He is a Scientist, water quality at [employer].) Pt has not received a shingles vaccine.   Depression Depression screen [facility] 2/9 08/07/2016 06/12/2016 08/03/2015 03/15/2015 06/01/2014  Decreased Interest 0 0 0 0 0  Down, Depressed, Hopeless 0 0 0 0 0  PHQ - 2 Score 0 0 0 0 0   Vision  Visual Acuity Screening   Right eye Left eye Both eyes  Without correction:     With correction: 20/20 20/20 20/20    Last saw Dr. [name] ~1 year ago at [facility].   Dentist Pt reports a non-tender bump on the right side of tongue first noticed 3 years ago. He last saw his dentist in February 2017.   Exercise Pt states that he does a lot of stretching and over 150 minutes of walking/week. Per CPE survey, pt reports joint and neck pain.   COPD Followed by Dr. [name] and has been taking  Spiriva ~1 year. He also has albuterol PRN.  Seasonal Allergies  Pt takes Flonase for seasonal allergies. He reports symptoms such as rhinorrhea and eye itching.   HTN Lab Results  Component Value Date   CREATININE 0.92 02/10/2016  Takes Norvasc 5 mg QD. He denies new side effects. Pt does not check his BP outside of the office. He denies chest pain or sob on exertion.   Lab Results  Component Value Date   CHOL 194 08/03/2015   HDL 36 (L) 08/03/2015   LDLCALC 140 (H) 08/03/2015   TRIG 91 08/03/2015   CHOLHDL 5.4 (H) 08/03/2015   GERD Dexilant 60 mg QD.  Patient Active Problem List   Diagnosis Date Noted  . SUI (stress urinary incontinence), male 03/20/2016  . Prostate cancer ([place]) 02/09/2016  . Obese 09/08/2015  . S/P right TKA 09/06/2015  . S/P knee replacement 09/06/2015  . COPD, mild ([place]) 07/20/2014  . Pulmonary nodules 02/23/2014  . Tobacco use disorder, moderate, in sustained remission 02/23/2014  . Recurrent right inguinal hernia 10/20/2013  . ED (erectile dysfunction) 08/26/2012  . Physical exam, annual 11/05/2011  . HTN (hypertension) 11/05/2011  . GERD (gastroesophageal reflux disease) 11/05/2011  . Chronic joint pain 11/05/2011   Past Medical History:  Diagnosis Date  . Allergy    seasonal  . Arthritis    oa  . GERD (gastroesophageal reflux  disease)   . Hypertension   . Prostate cancer ([place]) 01/2016   prostatectomy   . Shortness of breath dyspnea    Past Surgical History:  Procedure Laterality Date  . HERNIA REPAIR  last done 3-4 yrs ago   x 2  . INGUINAL HERNIA REPAIR Right 11/20/2013   Procedure: OPEN REPAIR OF RECURRENT RIGHT INGUINAL HERNIA WITH MESH;  Surgeon: [name]. [name], MD;  Location: [facility];  Service: General;  Laterality: Right;  . INSERTION OF MESH Right 11/20/2013   Procedure: INSERTION OF MESH;  Surgeon: [name]. [name], MD;  Location: [facility];  Service: General;  Laterality: Right;  . KNEE SURGERY Left july 2014   mcl and meniscus  repair  . LYMPHADENECTOMY Bilateral 02/09/2016   Procedure: PELVIC LYMPHADENECTOMY;  Surgeon: [name], MD;  Location: [facility];  Service: Urology;  Laterality: Bilateral;  . right knee arthroscopy     . ROBOT ASSISTED LAPAROSCOPIC RADICAL PROSTATECTOMY N/A 02/09/2016   Procedure: XI ROBOTIC ASSISTED LAPAROSCOPIC RADICAL PROSTATECTOMY WITH INDOCYANINE GREEN DYE AND ADHESIOLYSIS;  Surgeon: [name], MD;  Location: [facility];  Service: Urology;  Laterality: N/A;  . TOTAL KNEE ARTHROPLASTY Right 09/06/2015   Procedure: RIGHT TOTAL KNEE ARTHROPLASTY;  Surgeon: [name], MD;  Location: [facility];  Service: Orthopedics;  Laterality: Right;   Allergies  Allergen Reactions  . Percocet [Oxycodone-Acetaminophen] Other (See Comments)    Nausea-Doesn't work  . Amoxicillin Rash    Has patient had a PCN reaction causing immediate rash, facial/tongue/throat swelling, SOB or lightheadedness with hypotension: No Has patient had a PCN reaction causing severe rash involving mucus membranes or skin necrosis: No Has patient had a PCN reaction that required hospitalization No Has patient had a PCN reaction occurring within the last 10 years: Yes- 2016 If all of the above answers are "NO", then may proceed with Cephalosporin use.    Prior to Admission medications   Medication Sig Start Date End Date Taking? Authorizing Provider  albuterol (PROVENTIL HFA;VENTOLIN HFA) 108 (90 BASE) MCG/ACT inhaler Inhale 2 puffs into the lungs every 4 (four) hours as needed for wheezing or shortness of breath. 07/20/14  Yes Collene Gobble, MD  amLODipine (NORVASC) 5 MG tablet Take 1 tablet (5 mg total) by mouth daily. 08/03/15  Yes Orma Flaming, MD  clobetasol ointment (TEMOVATE) 0.05 % Apply topically 2 (two) times daily. Apply bid to itchy rash, do not use on face, genitals, axillae Patient taking differently: Apply 1 application topically 2 (two) times daily as needed (Apply to itchy rash, do not use on face, genitals,  axillae.).  02/24/13  Yes Orma Flaming, MD  dexlansoprazole (DEXILANT) 60 MG capsule TAKE 1 CAPSULE (60 MG TOTAL) BY MOUTH DAILY. 06/12/16  Yes [name], MD  docusate sodium (COLACE) 100 MG capsule Take 1 capsule (100 mg total) by mouth 2 (two) times daily. Patient taking differently: Take 100 mg by mouth 2 (two) times daily as needed (For constipation.).  09/08/15  Yes Matthew Babish, PA-C  fluticasone (FLONASE) 50 MCG/ACT nasal spray Place 2 sprays into both nostrils daily. Patient taking differently: Place 2 sprays into both nostrils daily as needed for allergies.  08/03/15  Yes Orma Flaming, MD  HYDROcodone-acetaminophen [facility]) 7.5-325 MG tablet Take 1-2 tablets by mouth every 6 (six) hours as needed for moderate pain. 02/09/16  Yes Amanda Dancy, PA-C  Lin[facility] ARTHRITIS PAIN RELIEF) PADS Apply 1 each topically daily as needed (Pain).   Yes Historical Provider, MD  methocarbamol (ROBAXIN) 500 MG tablet Take 1 tablet (500 mg total) by mouth every 6 (six) hours as needed for muscle spasms. 09/08/15  Yes Matthew Babish, PA-C  Naphazoline-Pheniramine (OPCON-A) 0.027-0.315 % SOLN Apply 1 drop to eye 2 (two) times daily as needed (dry/irritated eyes).   Yes Historical Provider, MD  polyethylene glycol (MIRALAX / GLYCOLAX) packet Take 17 g by mouth 2 (two) times daily. Patient taking differently: Take 17 g by mouth 2 (two) times daily as needed (For constipation.).  09/08/15  Yes Matthew Babish, PA-C  tiotropium (SPIRIVA) 18 MCG inhalation capsule Place 1 capsule (18 mcg total) into inhaler and inhale daily. 08/09/15  Yes Collene Gobble, MD  trolamine salicylate (ASPERCREME) 10 % cream Apply 1 application topically as needed for muscle pain.   Yes Historical Provider, MD  sulfamethoxazole-trimethoprim (BACTRIM DS,SEPTRA DS) 800-160 MG tablet Take 1 tablet by mouth 2 (two) times daily. Start the day prior to foley removal appointment Patient not taking: Reported on 08/07/2016 02/09/16    Debbrah Alar, PA-C   Social History   Social History  . Marital status: Married    Spouse name: N/A  . Number of children: N/A  . Years of education: N/A   Occupational History  . Not on file.   Social History Main Topics  . Smoking status: Former Smoker    Packs/day: 1.50    Years: 20.00    Types: Cigarettes    Quit date: 11/05/1999  . Smokeless tobacco: Never Used  . Alcohol use No     Comment: last used 25 years ago   . Drug use:     Types: Cocaine     Comment: last used cocaine  and acid 25 yrs ago,none used since  . Sexual activity: Not on file   Other Topics Concern  . Not on file   Social History Narrative  . No narrative on file   Review of Systems  HENT: Positive for rhinorrhea.   Eyes: Positive for itching.  Respiratory: Negative for shortness of breath.   Cardiovascular: Negative for chest pain.  Genitourinary: Positive for frequency.  Musculoskeletal: Positive for arthralgias and neck pain.  Allergic/Immunologic: Positive for environmental allergies.   BP 126/80   Pulse 81   Temp 98.1 F (36.7 C) (Oral)   Resp 18   Ht 5\' 11"  (1.803 m)   Wt 216 lb 3.2 oz (98.1 kg)   SpO2 95%   BMI 30.15 kg/m     Objective:   Physical Exam  Constitutional: He is oriented to person, place, and time. He appears well-developed and well-nourished.  HENT:  Head: Normocephalic and atraumatic.  Right Ear: External ear normal.  Left Ear: External ear normal.  Mouth/Throat: Oropharynx is clear and moist.  R pinna: 5 mm light brown nevus Possible small hemangioma on the R lateral tongue, 1-2 mm. No other oral lesions.  Eyes: Conjunctivae and EOM are normal. Pupils are equal, round, and reactive to light.  Neck: Normal range of motion. Neck supple. No thyromegaly present.  Cardiovascular: Normal rate, regular rhythm, normal heart sounds and intact distal pulses.   Pulmonary/Chest: Effort normal and breath sounds normal. No respiratory distress. He has no wheezes.    Abdominal: Soft. He exhibits no distension. There is no tenderness.  Musculoskeletal: Normal range of motion. He exhibits no edema or tenderness.  Well-healed scar on the R knee  Lymphadenopathy:    He has no cervical adenopathy.  Neurological: He is alert and oriented to person, place, and  time. He has normal reflexes.  Skin: Skin is warm and dry.  Psychiatric: He has a normal mood and affect. His behavior is normal.  Vitals reviewed.     Assessment & Plan:   NYCHOLAS PEOT is a 65 y.o. male Annual physical exam  - -anticipatory guidance as below in AVS, screening labs above. Health maintenance items as above in HPI discussed/recommended as applicable.   Essential hypertension - Plan: amLODipine (NORVASC) 5 MG tablet, COMPLETE METABOLIC PANEL WITH GFR  -stable. No med changes. Labs pending.  Acute allergic rhinitis due to pollen, unspecified seasonality - Plan: fluticasone (FLONASE) 50 MCG/ACT nasal spray  Rash, skin, Pruritus - Plan: triamcinolone cream (KENALOG) 0.1 %  -Possible eczema/atopic dermatitis. Triamcinolone cream prescribed, RTC precautions if persistent.  Gastroesophageal reflux disease, esophagitis presence not specified  - Discussed episodic use of PPI, trigger avoidance.  Allergy, subsequent encounter  -Continue over-the-counter Flonase  Need for shingles vaccine - Plan: Zoster Vaccine Live, PF, (ZOSTAVAX) 09811 UNT/0.65ML injection  -Prescription printed to have at his pharmacy if not cost prohibitive  Screening for hyperlipidemia - Plan: Lipid panel  -Return for fasting labs for screening labs above.  Meds ordered this encounter  Medications  . Zoster Vaccine Live, PF, (ZOSTAVAX) 91478 UNT/0.65ML injection    Sig: Inject 19,400 Units into the skin once.    Dispense:  1 each    Refill:  0  . amLODipine (NORVASC) 5 MG tablet    Sig: Take 1 tablet (5 mg total) by mouth daily.    Dispense:  90 tablet    Refill:  3  . fluticasone (FLONASE) 50 MCG/ACT  nasal spray    Sig: Place 2 sprays into both nostrils daily as needed for allergies.    Dispense:  16 g    Refill:  6  . triamcinolone cream (KENALOG) 0.1 %    Sig: Apply 1 application topically 2 (two) times daily as needed. To affected areas only as needed.    Dispense:  30 g    Refill:  1   Patient Instructions    Return within the next 10 days for fasting blood work. You will not need an appointment, just return and let them know you are here for blood work only.  No change in medications for now, continue acid blocker only if needed, and try the new steroid cream that is slightly milder than your previous prescription. If you have a very mild itching area,  you can always try over-the-counter cortisone.   If any change in the bump on the side of your tongue, or other new symptoms, return to discuss those further.  I prescribed the shingles vaccine to fill at your pharmacy if you would like to have it and not too costly.   Keeping you healthy  Get these tests  Blood pressure- Have your blood pressure checked once a year by your healthcare provider.  Normal blood pressure is 120/80  Weight- Have your body mass index (BMI) calculated to screen for obesity.  BMI is a measure of body fat based on height and weight. You can also calculate your own BMI at [employer].  Cholesterol- Have your cholesterol checked every year.  Diabetes- Have your blood sugar checked regularly if you have high blood pressure, high cholesterol, have a family history of diabetes or if you are overweight.  Screening for Colon Cancer- Colonoscopy starting at age 68.  Screening may begin sooner depending on your family history and other health conditions. Follow up  colonoscopy as directed by your Gastroenterologist.  Screening for Prostate Cancer- Both blood work (PSA) and a rectal exam help screen for Prostate Cancer.  Screening begins at age 58 with African-American men and at age 76 with  Caucasian men.  Screening may begin sooner depending on your family history.  Take these medicines  Aspirin- One aspirin daily can help prevent Heart disease and Stroke.  Flu shot- Every fall.  Tetanus- Every 10 years.  Zostavax- Once after the age of 62 to prevent Shingles.  Pneumonia shot- Once after the age of 82; if you are younger than 17, ask your healthcare provider if you need a Pneumonia shot.  Take these steps  Don't smoke- If you do smoke, talk to your doctor about quitting.  For tips on how to quit, go to www.smokefree.gov or call 1-800-QUIT-NOW.  Be physically active- Exercise 5 days a week for at least 30 minutes.  If you are not already physically active start slow and gradually work up to 30 minutes of moderate physical activity.  Examples of moderate activity include walking briskly, mowing the yard, dancing, swimming, bicycling, etc.  Eat a healthy diet- Eat a variety of healthy food such as fruits, vegetables, low fat milk, low fat cheese, yogurt, lean meant, poultry, fish, beans, tofu, etc. For more information go to www.thenutritionsource.org  Drink alcohol in moderation- Limit alcohol intake to less than two drinks a day. Never drink and drive.  Dentist- Brush and floss twice daily; visit your dentist twice a year.  Depression- Your emotional health is as important as your physical health. If you're feeling down, or losing interest in things you would normally enjoy please talk to your healthcare provider.  Eye exam- Visit your eye doctor every year.  Safe sex- If you may be exposed to a sexually transmitted infection, use a condom.  Seat belts- Seat belts can save your life; always wear one.  Smoke/Carbon Monoxide detectors- These detectors need to be installed on the appropriate level of your home.  Replace batteries at least once a year.  Skin cancer- When out in the sun, cover up and use sunscreen 15 SPF or higher.  Violence- If anyone is threatening  you, please tell your healthcare provider.  Living Will/ Health care power of attorney- Speak with your healthcare provider and family.  IF you received an x-ray today, you will receive an invoice from [facility]. Please contact [facility] at [phone] with questions or concerns regarding your invoice.   IF you received labwork today, you will receive an invoice from [employer]. Please contact [facility] at [phone] with questions or concerns regarding your invoice.   Our billing staff will not be able to assist you with questions regarding bills from these companies.  You will be contacted with the lab results as soon as they are available. The fastest way to get your results is to activate your My Chart account. Instructions are located on the last page of this paperwork. If you have not heard from [place] regarding the results in 2 weeks, please contact this office.        I personally performed the services described in this documentation, which was scribed in my presence. The recorded information has been reviewed and considered, and addended by me as needed.   Signed,   [name], MD [facility].  08/09/16 3:35 PM

## 2016-08-22 ENCOUNTER — Other Ambulatory Visit: Payer: Self-pay | Admitting: Emergency Medicine

## 2016-09-04 ENCOUNTER — Ambulatory Visit (INDEPENDENT_AMBULATORY_CARE_PROVIDER_SITE_OTHER): Payer: Managed Care, Other (non HMO) | Admitting: Emergency Medicine

## 2016-09-04 ENCOUNTER — Encounter: Payer: Self-pay | Admitting: Emergency Medicine

## 2016-09-04 DIAGNOSIS — J449 Chronic obstructive pulmonary disease, unspecified: Secondary | ICD-10-CM

## 2016-09-04 NOTE — Assessment & Plan Note (Signed)
He had temporarily stopped using Spiriva every day. On discussion he indicated that usually after 3-4 days he notices that his breathing is worse so he takes medication. I had considered stopping it altogether but based on this a believe we should continue, go back to every day use. We will do this for the next 6 months and then we will discuss whether he has truly benefited. If not then we could consider stopping scheduled medication and using only albuterol as needed.  We will go back to using Spiriva once a day Do this until next visit. Keep track of your your breathing and we will decide if this been beneficial. If not we will consider stopping altogether.  Take albuterol 2 puffs up to every 4 hours if needed for shortness of breath.  Flu shot up to date  Follow with Dr [name] in 6 months or sooner if you have any problems

## 2016-09-04 NOTE — Patient Instructions (Addendum)
We will go back to using Spiriva once a day Do this until next visit. Keep track of your your breathing and we will decide if this been beneficial. If not we will consider stopping altogether.  Take albuterol 2 puffs up to every 4 hours if needed for shortness of breath.  Flu shot up to date  Follow with Dr [name] in 6 months or sooner if you have any problems

## 2016-09-04 NOTE — Progress Notes (Signed)
Subjective:    Patient ID: [name], male    DOB: 29-Jun-1951, 65 y.o.   MRN: [id number]  HPI 65 yo former smoker (30 pk-yrs), HTN, GERD, allergies, OA. Referred today for pulmonary nodules on CT chest.  He underwent CXR in 06/2013 that showed possible nodule, prompted CT chest that confirmed scattered sub-71mm nodules. Repeat CT 4/15 showed that the nodules are stable, also a new small area of GGI. He has some occasional wheeze, is able to stay active. He occasionally gets choked on her own secretions. He is on ACE-i.   ROV 07/20/14 -- f/u visit for hx tobacco, pulmonary nodules that have been scanned first in 10/'14, then stable 01/05/14. There was a small GGI to follow as well. He underwent PFTs in July 2015 that showed mild obstructive lung disease. CT scan from 07/13/14 is stable, both the nodules and the R basilar GG nodule. Next scan should be in 1 yr.   ROV 09/21/14 -- follow up for obstructive lung disease and pulmonary nodules that are been followed by serial CT scans. The last scan was on 07/13/14 and was stable.   ROV 08/09/15 -- follow-up visit for scattered pulmonary nodules by CT scan of the chest as well as mild obstructive lung disease in the setting of former tobacco use. Repeat CT scan of his chest was done 07/12/15 that I personally reviewed. This showed unchanged bilateral small pulmonary nodules. A 9 mm semi solid ground glass opacity in the posterior right lower lobe has resolved. He should not need any subsequent scans. He is planning to undergo R knee replacement with Dr Theda Sers. He reports some occasional exertional SOB with strenuous work, some rare cough. He hears some wheeze when he lays supine. He uses albuterol 2x a week. Hard to tell if the albuterol helps him. We did spirometry today > FEV1 2.47L (78% predicted).   ROV 10/28/15 -- follow-up visit for COPD and bilateral pulmonary nodules on CT scan of the chest that have been deemed benign with multiple serial scans.   He underwent R knee surgery 2 months ago. At his last visit we started Spiriva. He thought that it did help him some - made his breathing a bit better. He has SABA, was using it about every day but not really sure it was doing much. He is having minimal cough. He is having mid back pain, both sides, better with laying down supine. Sometimes worse with cough.   ROV 09/04/16 -- this is a follow-up visit for patient with a history of COPD. Also with benign pulmonary nodules that were followed with serial CT scans of the chest. Last seen in February of this year. At that time we continued him on Spiriva. He returns today to report . He has been treated surgically for prostate cancer. He is still having some exertional SOB with his exercise routine. Able to do his daily routine without SOB   Review of Systems  Constitutional: Negative for fever and unexpected weight change.  HENT: Negative for congestion, dental problem, ear pain, nosebleeds, postnasal drip, rhinorrhea, sinus pressure, sneezing, sore throat and trouble swallowing.   Eyes: Negative for redness and itching.  Respiratory: Positive for shortness of breath and wheezing. Negative for chest tightness.   Cardiovascular: Negative for palpitations and leg swelling.       Knees   Gastrointestinal: Negative for nausea and vomiting.  Genitourinary: Negative for dysuria.  Musculoskeletal: Negative for joint swelling.  Skin: Negative for rash.  Neurological: Negative  for headaches.  Hematological: Does not bruise/bleed easily.  Psychiatric/Behavioral: Negative for dysphoric mood. The patient is not nervous/anxious.         Objective:   Physical Exam Vitals:   09/04/16 0948  BP: (!) 150/86  Cuff Size: Normal  Pulse: 70  SpO2: 96%  Weight: 215 lb 3.2 oz (97.6 kg)  Height: 5\' 11"  (1.803 m)   Gen: Pleasant, well-nourished, in no distress,  normal affect  ENT: No lesions,  mouth clear,  oropharynx clear, no postnasal drip  Neck: No  JVD, no TMG, no carotid bruits  Lungs: No use of accessory muscles, clear without rales or rhonchi  Cardiovascular: RRR, heart sounds normal, no murmur or gallops, no peripheral edema  Musculoskeletal: No deformities, no cyanosis or clubbing  Neuro: alert, non focal  Skin: Warm, no lesions or rashes  07/13/14 --  COMPARISON: 01/05/2014  FINDINGS: Mediastinum: Normal heart size. No pericardial effusion. The trachea appears patent and is midline. Normal appearance of the esophagus. No mediastinal or hilar adenopathy identified.  Lungs/Pleura: There is no pleural effusion identified. Index nodule in the right upper lobe measures 4 mm and is unchanged from previous exam. Subpleural nodule within the anterior right upper lobe is unchanged measuring 4 mm, image 24/series 3. Subpleural right middle lobe nodule is unchanged measuring 3 mm, image 33/series 3. No new or enlarging pulmonary nodules or mass is identified. Sub solid lesion within the right base measures 9 mm, image 105 of series 602. This is stable when compared with 01/05/2014 but is new from 07/04/2013.  Upper Abdomen: The visualized portions of the liver, gallbladder, and pancreas are unremarkable. The spleen is negative.  Musculoskeletal: Review of the visualized bony structures is on unremarkable. No aggressive lytic or sclerotic bone lesions.  IMPRESSION: 1. Stable small solid pulmonary nodules. Compatible with benign lesions. 2. There is a 9 mm sub solid lesion in the right base which is unchanged from 01/05/2014 but new from 07/04/2013. This will need annual surveillance for minimum of 36 months to document of benignity. The next followup examination should be obtained on 07/14/2015. A PET-CT would be of limited value in evaluating this lesion. This recommendation follows the consensus statement: Recommendations for the Management of Subsolid Pulmonary Nodules Detected at CT: A Statement from the  [place] as published in Radiology 2013; 266:304-317.     Assessment & Plan:  COPD, mild He had temporarily stopped using Spiriva every day. On discussion he indicated that usually after 3-4 days he notices that his breathing is worse so he takes medication. I had considered stopping it altogether but based on this a believe we should continue, go back to every day use. We will do this for the next 6 months and then we will discuss whether he has truly benefited. If not then we could consider stopping scheduled medication and using only albuterol as needed.  We will go back to using Spiriva once a day Do this until next visit. Keep track of your your breathing and we will decide if this been beneficial. If not we will consider stopping altogether.  Take albuterol 2 puffs up to every 4 hours if needed for shortness of breath.  Flu shot up to date  Follow with Dr [name] in 6 months or sooner if you have any problems

## 2016-11-03 ENCOUNTER — Telehealth: Payer: Self-pay | Admitting: Emergency Medicine

## 2016-11-03 NOTE — Telephone Encounter (Signed)
Pt states that his Spiriva Handihaler has a co-pay. Advised pt to contact his insurance to see if there is a cheaper alternative. Will follow up with him on Monday morning 11/04/16.

## 2016-11-06 NOTE — Telephone Encounter (Signed)
Pt states atrovent is a cheaper alternative to sprivia per his insurance. Pt also states he is willing to try a different medication if RB thinks another medication would better for him.   RB please advise if okay to switch.  Thanks.

## 2016-11-06 NOTE — Telephone Encounter (Signed)
Spoke with pt, who states he is currently driving.pt states he has alternatives written down at home, and will give [place] a call back once home to provide [place] with those. Will await call back.

## 2016-11-06 NOTE — Telephone Encounter (Signed)
Patient is returning phone call.  °

## 2016-11-07 NOTE — Telephone Encounter (Signed)
Patient called back regarding meds - advised RB in the office this afternoon - pt can be reached at [phone] - ok to leave message on pt vm - pt is at work -pr

## 2016-11-08 MED ORDER — TIOTROPIUM BROMIDE MONOHYDRATE 2.5 MCG/ACT IN AERS
2.0000 | INHALATION_SPRAY | Freq: Every day | RESPIRATORY_TRACT | 5 refills | Status: DC
Start: 2016-11-08 — End: 2017-03-12

## 2016-11-08 NOTE — Telephone Encounter (Signed)
Ok with me to change to spiriva respimat, 2 puffs daily

## 2016-11-08 NOTE — Telephone Encounter (Signed)
Pt aware of rx change.  rx sent to preferred pharmacy.  Nothing further needed.

## 2016-11-08 NOTE — Telephone Encounter (Signed)
Patient returning call, [phone].  States he got a savings card yesterday in the mail for Spiriva Respimat in the mail 2.5 in a pump and wants to see if possibly Dr. [name] will write him a RX for this.  He works at [employer] and so is difficult to reach.

## 2016-11-08 NOTE — Telephone Encounter (Signed)
Spoke with pt. He would like to have his Spiriva Handihaler changed to Spiriva Respimat. Pt received a savings coupon for Spiriva Respimat.  RB - please advise if we can change this prescription. Thanks.

## 2016-12-18 ENCOUNTER — Telehealth: Payer: Self-pay | Admitting: Family Medicine

## 2016-12-18 NOTE — Telephone Encounter (Signed)
Pt is calling to see if [name] could call in a generic for Dexilant.  He states that his insurance is no longer covering this medication and would like a different medication called in.  [phone]

## 2016-12-21 ENCOUNTER — Telehealth: Payer: Self-pay | Admitting: Family Medicine

## 2016-12-21 NOTE — Telephone Encounter (Signed)
Please see note below from pt call on 12/18/16:   Pt is calling to see if [name] could call in a generic for Dexilant.  He states that his insurance is no longer covering this medication and would like a different medication called in.  [phone]

## 2016-12-21 NOTE — Telephone Encounter (Signed)
Any generic or sub for this med to try? I dont see this on his list

## 2016-12-22 NOTE — Telephone Encounter (Signed)
Options would be Nexium or prilosec.  He is also on a fairly strong dose of Dexilant, so 30mg  may be another option. Can check with his pharmacy to see best option and ok to sub any of these.

## 2016-12-23 NOTE — Telephone Encounter (Signed)
lmtcb, dose he want 30mg  dexilant, nexium or prilosec

## 2016-12-27 ENCOUNTER — Other Ambulatory Visit: Payer: Self-pay | Admitting: Family Medicine

## 2017-01-02 ENCOUNTER — Other Ambulatory Visit: Payer: Self-pay | Admitting: Family Medicine

## 2017-02-08 ENCOUNTER — Ambulatory Visit: Payer: Managed Care, Other (non HMO) | Admitting: Family Medicine

## 2017-02-13 ENCOUNTER — Ambulatory Visit: Payer: Managed Care, Other (non HMO) | Admitting: Family Medicine

## 2017-02-19 ENCOUNTER — Encounter: Payer: Self-pay | Admitting: Family Medicine

## 2017-02-19 ENCOUNTER — Ambulatory Visit (INDEPENDENT_AMBULATORY_CARE_PROVIDER_SITE_OTHER): Payer: Managed Care, Other (non HMO) | Admitting: Family Medicine

## 2017-02-19 VITALS — BP 105/60 | HR 59 | Temp 98.0°F | Resp 16 | Ht 71.0 in | Wt 216.2 lb

## 2017-02-19 DIAGNOSIS — K219 Gastro-esophageal reflux disease without esophagitis: Secondary | ICD-10-CM

## 2017-02-19 DIAGNOSIS — I10 Essential (primary) hypertension: Secondary | ICD-10-CM

## 2017-02-19 DIAGNOSIS — R0683 Snoring: Secondary | ICD-10-CM

## 2017-02-19 DIAGNOSIS — R400 Somnolence: Secondary | ICD-10-CM

## 2017-02-19 DIAGNOSIS — Z13220 Encounter for screening for lipoid disorders: Secondary | ICD-10-CM

## 2017-02-19 LAB — LIPID PANEL
CHOL/HDL RATIO: 4 ratio (ref 0.0–5.0)
Cholesterol, Total: 151 mg/dL (ref 100–199)
HDL: 38 mg/dL — ABNORMAL LOW (ref >39)
LDL CALC: 97 mg/dL (ref 0–99)
Triglycerides: 81 mg/dL (ref 0–149)
VLDL CHOLESTEROL CAL: 16 mg/dL (ref 5–40)

## 2017-02-19 LAB — COMPREHENSIVE METABOLIC PANEL
ALT: 21 IU/L (ref 0–44)
AST: 17 IU/L (ref 0–40)
Albumin/Globulin Ratio: 1.9 (ref 1.2–2.2)
Albumin: 4.4 g/dL (ref 3.6–4.8)
Alkaline Phosphatase: 96 IU/L (ref 39–117)
BUN / CREAT RATIO: 16 (ref 10–24)
BUN: 12 mg/dL (ref 8–27)
Bilirubin Total: 0.4 mg/dL (ref 0.0–1.2)
CO2: 23 mmol/L (ref 18–29)
CREATININE: 0.76 mg/dL (ref 0.76–1.27)
Calcium: 9.2 mg/dL (ref 8.6–10.2)
Chloride: 104 mmol/L (ref 96–106)
GFR, EST AFRICAN AMERICAN: 111 mL/min/{1.73_m2} (ref >59)
GFR, EST NON AFRICAN AMERICAN: 96 mL/min/{1.73_m2} (ref >59)
GLOBULIN, TOTAL: 2.3 g/dL (ref 1.5–4.5)
GLUCOSE: 106 mg/dL — AB (ref 65–99)
Potassium: 4.5 mmol/L (ref 3.5–5.2)
SODIUM: 139 mmol/L (ref 134–144)
TOTAL PROTEIN: 6.7 g/dL (ref 6.0–8.5)

## 2017-02-19 MED ORDER — AMLODIPINE BESYLATE 5 MG PO TABS: 5.0000 mg | ORAL_TABLET | Freq: Every day | ORAL | 1 refills | Status: DC

## 2017-02-19 MED ORDER — DEXLANSOPRAZOLE 60 MG PO CPDR: DELAYED_RELEASE_CAPSULE | ORAL | 1 refills | Status: DC

## 2017-02-19 NOTE — Patient Instructions (Addendum)
If you have breakthrough heartburn symptoms - you may need to return to dexilant once per day. Avoid foods that can worsen it as below.   I will check labs today, but if cholesterol is elevated, may need to repeat that testing after fasting for 8 hours.   For snoring and pausing in breathing, daytime sleepiness - I will refer you for a sleep test.   Steroid nasal spray as needed for allergies, if you have any worsening symptoms or other new symptoms, return to discuss this further.  Food Choices for Gastroesophageal Reflux Disease, Adult When you have gastroesophageal reflux disease (GERD), the foods you eat and your eating habits are very important. Choosing the right foods can help ease the discomfort of GERD. Consider working with a diet and nutrition specialist (dietitian) to help you make healthy food choices. What general guidelines should I follow? Eating plan  Choose healthy foods low in fat, such as fruits, vegetables, whole grains, low-fat dairy products, and lean meat, fish, and poultry.  Eat frequent, small meals instead of three large meals each day. Eat your meals slowly, in a relaxed setting. Avoid bending over or lying down until 2-3 hours after eating.  Limit high-fat foods such as fatty meats or fried foods.  Limit your intake of oils, butter, and shortening to less than 8 teaspoons each day.  Avoid the following: ? Foods that cause symptoms. These may be different for different people. Keep a food diary to keep track of foods that cause symptoms. ? Alcohol. ? Drinking large amounts of liquid with meals. ? Eating meals during the 2-3 hours before bed.  Cook foods using methods other than frying. This may include baking, grilling, or broiling. Lifestyle   Maintain a healthy weight. Ask your health care provider what weight is healthy for you. If you need to lose weight, work with your health care provider to do so safely.  Exercise for at least 30 minutes on 5 or  more days each week, or as told by your health care provider.  Avoid wearing clothes that fit tightly around your waist and chest.  Do not use any products that contain nicotine or tobacco, such as cigarettes and e-cigarettes. If you need help quitting, ask your health care provider.  Sleep with the head of your bed raised. Use a wedge under the mattress or blocks under the bed frame to raise the head of the bed. What foods are not recommended? The items listed may not be a complete list. Talk with your dietitian about what dietary choices are best for you. Grains Pastries or quick breads with added fat. [place] toast. Vegetables Deep fried vegetables. [place] fries. Any vegetables prepared with added fat. Any vegetables that cause symptoms. For some people this may include tomatoes and tomato products, chili peppers, onions and garlic, and horseradish. Fruits Any fruits prepared with added fat. Any fruits that cause symptoms. For some people this may include citrus fruits, such as oranges, grapefruit, pineapple, and lemons. Meats and other protein foods High-fat meats, such as fatty beef or pork, hot dogs, ribs, ham, sausage, salami and bacon. Fried meat or protein, including fried fish and fried chicken. Nuts and nut butters. Dairy Whole milk and chocolate milk. Sour cream. Cream. Ice cream. Cream cheese. Milk shakes. Beverages Coffee and tea, with or without caffeine. Carbonated beverages. Sodas. Energy drinks. Fruit juice made with acidic fruits (such as orange or grapefruit). Tomato juice. Alcoholic drinks. Fats and oils Butter. Margarine. Shortening. Ghee. Sweets  and desserts Chocolate and cocoa. Donuts. Seasoning and other foods Pepper. Peppermint and spearmint. Any condiments, herbs, or seasonings that cause symptoms. For some people, this may include curry, hot sauce, or vinegar-based salad dressings. Summary  When you have gastroesophageal reflux disease (GERD), food and  lifestyle choices are very important to help ease the discomfort of GERD.  Eat frequent, small meals instead of three large meals each day. Eat your meals slowly, in a relaxed setting. Avoid bending over or lying down until 2-3 hours after eating.  Limit high-fat foods such as fatty meat or fried foods. This information is not intended to replace advice given to you by your health care provider. Make sure you discuss any questions you have with your health care provider. Document Released: 09/04/2005 Document Revised: 09/05/2016 Document Reviewed: 09/05/2016 Elsevier Interactive Patient Education  2017 [employer].   IF you received an x-ray today, you will receive an invoice from [facility]. Please contact [facility] at [phone] with questions or concerns regarding your invoice.   IF you received labwork today, you will receive an invoice from [facility]. Please contact [facility] at [phone] with questions or concerns regarding your invoice.   Our billing staff will not be able to assist you with questions regarding bills from these companies.  You will be contacted with the lab results as soon as they are available. The fastest way to get your results is to activate your My Chart account. Instructions are located on the last page of this paperwork. If you have not heard from [place] regarding the results in 2 weeks, please contact this office.

## 2017-02-19 NOTE — Progress Notes (Signed)
Subjective:  This chart was scribed for Pedro Agreste, MD by Tamsen Roers, at [place] [facility].  This patient was seen in room 12 and the patient's care was started at 10:32 AM.   Chief Complaint  Patient presents with  . Follow-up    6 month     Patient ID: [name], male    DOB: 06-Dec-1950, 66 y.o.   [name]  HPI HPI Comments: [name] is a 66 y.o. male who presents to Primary Care at [facility] for a six month follow up  He has a history of multiple medical problems including hypertension, COPD, GERD and prostate cancer.  Patient had a muffin 2 hours ago with some water. He has some congestion which he has been using Flonase for. Denies any fevers/chills.   Hypertension - amlodipine 5 mg QD---Patient is not checking his blood pressures at home.   Lab Results  Component Value Date   CREATININE 0.92 02/10/2016  Patient is not currently on any cholesterol medical.     GERD- Dexilant 60 mg QD--- Takes this medication about 3-4 times per week and states that his reflux really occurs based on what he eats.  He does feel nauseas from time to time when he goes to sleep especially if he eats at a later time.    COPD:albuterol as needed and Spiriva QD. He had a history of pulmonary nodules evaluated by Dr. West Carbo.  Note Feb 2017, stable for 2 years. Plan for 6 month follow up with pulmonary. Was last seen by pulmonary December 2017. Follow up in 6 months. ---He has occasional chest pains and shortness of breath but is complaint with his Spiriva and albuterol when this occurs.  Patient has an appointment coming up with Dr. West Carbo.     Prostate cancer:  Followed by Dr. Barbee Cough Prostatectomy 02-09-16.  Sleep: Patient states that he does not sleep a lot and per his wife, he has a lot of pausing in his sleep.  He feels tired "all the time" during the day and snores often as well.  Patient has never been tested for sleep apnea in the past.     Patient Active  Problem List   Diagnosis Date Noted  . SUI (stress urinary incontinence), male 03/20/2016  . Prostate cancer ([place]) 02/09/2016  . Obese 09/08/2015  . S/P right TKA 09/06/2015  . S/P knee replacement 09/06/2015  . COPD, mild ([place]) 07/20/2014  . Pulmonary nodules 02/23/2014  . Tobacco use disorder, moderate, in sustained remission 02/23/2014  . Recurrent right inguinal hernia 10/20/2013  . ED (erectile dysfunction) 08/26/2012  . Physical exam, annual 11/05/2011  . HTN (hypertension) 11/05/2011  . GERD (gastroesophageal reflux disease) 11/05/2011  . Chronic joint pain 11/05/2011   Past Medical History:  Diagnosis Date  . Allergy    seasonal  . Arthritis    oa  . Asthma   . COPD (chronic obstructive pulmonary disease) ([place])   . GERD (gastroesophageal reflux disease)   . Hypertension   . Prostate cancer ([place]) 01/2016   prostatectomy   . Shortness of breath dyspnea    Past Surgical History:  Procedure Laterality Date  . HERNIA REPAIR  last done 3-4 yrs ago   x 2  . INGUINAL HERNIA REPAIR Right 11/20/2013   Procedure: OPEN REPAIR OF RECURRENT RIGHT INGUINAL HERNIA WITH MESH;  Surgeon: [name]. [name], MD;  Location: [facility];  Service: General;  Laterality: Right;  . INSERTION OF MESH Right  11/20/2013   Procedure: INSERTION OF MESH;  Surgeon: [name]. [name], MD;  Location: [facility];  Service: General;  Laterality: Right;  . KNEE SURGERY Left july 2014   mcl and meniscus repair  . LYMPHADENECTOMY Bilateral 02/09/2016   Procedure: PELVIC LYMPHADENECTOMY;  Surgeon: [name], MD;  Location: [facility];  Service: Urology;  Laterality: Bilateral;  . right knee arthroscopy     . ROBOT ASSISTED LAPAROSCOPIC RADICAL PROSTATECTOMY N/A 02/09/2016   Procedure: XI ROBOTIC ASSISTED LAPAROSCOPIC RADICAL PROSTATECTOMY WITH INDOCYANINE GREEN DYE AND ADHESIOLYSIS;  Surgeon: [name], MD;  Location: [facility];  Service: Urology;  Laterality: N/A;  . TOTAL KNEE ARTHROPLASTY Right 09/06/2015    Procedure: RIGHT TOTAL KNEE ARTHROPLASTY;  Surgeon: [name], MD;  Location: [facility];  Service: Orthopedics;  Laterality: Right;   Allergies  Allergen Reactions  . Percocet [Oxycodone-Acetaminophen] Other (See Comments)    Nausea-Doesn't work  . Amoxicillin Rash    Has patient had a PCN reaction causing immediate rash, facial/tongue/throat swelling, SOB or lightheadedness with hypotension: No Has patient had a PCN reaction causing severe rash involving mucus membranes or skin necrosis: No Has patient had a PCN reaction that required hospitalization No Has patient had a PCN reaction occurring within the last 10 years: Yes- 2016 If all of the above answers are "NO", then may proceed with Cephalosporin use.    Prior to Admission medications   Medication Sig Start Date End Date Taking? Authorizing Provider  albuterol (PROVENTIL HFA;VENTOLIN HFA) 108 (90 BASE) MCG/ACT inhaler Inhale 2 puffs into the lungs every 4 (four) hours as needed for wheezing or shortness of breath. 07/20/14  Yes Collene Gobble, MD  amLODipine (NORVASC) 5 MG tablet Take 1 tablet (5 mg total) by mouth daily. 08/07/16  Yes Pedro Agreste, MD  clobetasol ointment (TEMOVATE) 0.05 % Apply topically 2 (two) times daily. Apply bid to itchy rash, do not use on face, genitals, axillae Patient taking differently: Apply 1 application topically 2 (two) times daily as needed (Apply to itchy rash, do not use on face, genitals, axillae.).  02/24/13  Yes Guest, Benn Moulder, MD  DEXILANT 60 MG capsule TAKE 1 CAPSULE (60 MG TOTAL) BY MOUTH DAILY. 01/02/17  Yes Pedro Agreste, MD  docusate sodium (COLACE) 100 MG capsule Take 1 capsule (100 mg total) by mouth 2 (two) times daily. Patient taking differently: Take 100 mg by mouth 2 (two) times daily as needed (For constipation.).  09/08/15  Yes Babish, Rodman Key, PA-C  fluticasone (FLONASE) 50 MCG/ACT nasal spray Place 2 sprays into both nostrils daily as needed for allergies. 08/07/16  Yes  Pedro Agreste, MD  HYDROcodone-acetaminophen (NORCO) 7.5-325 MG tablet Take 1-2 tablets by mouth every 6 (six) hours as needed for moderate pain. 02/09/16  Yes Dancy, Amanda, PA-C  Liniments [facility] ARTHRITIS PAIN RELIEF) PADS Apply 1 each topically daily as needed (Pain).   Yes [provider]  methocarbamol (ROBAXIN) 500 MG tablet Take 1 tablet (500 mg total) by mouth every 6 (six) hours as needed for muscle spasms. 09/08/15  Yes Babish, Rodman Key, PA-C  Naphazoline-Pheniramine (OPCON-A) 0.027-0.315 % SOLN Apply 1 drop to eye 2 (two) times daily as needed (dry/irritated eyes).   Yes [provider]  polyethylene glycol (MIRALAX / GLYCOLAX) packet Take 17 g by mouth 2 (two) times daily. Patient taking differently: Take 17 g by mouth 2 (two) times daily as needed (For constipation.).  09/08/15  Yes Babish, Rodman Key, PA-C  Tiotropium Bromide Monohydrate ([place])  2.5 MCG/ACT AERS Inhale 2 puffs into the lungs daily. 11/08/16  Yes Collene Gobble, MD  triamcinolone cream (KENALOG) 0.1 % Apply 1 application topically 2 (two) times daily as needed. To affected areas only as needed. 08/07/16  Yes Pedro Agreste, MD  trolamine salicylate (ASPERCREME) 10 % cream Apply 1 application topically as needed for muscle pain.   Yes [provider]   Social History   Social History  . Marital status: Married    Spouse name: N/A  . Number of children: N/A  . Years of education: N/A   Occupational History  . Not on file.   Social History Main Topics  . Smoking status: Former Smoker    Packs/day: 1.50    Years: 20.00    Types: Cigarettes    Quit date: 11/05/1999  . Smokeless tobacco: Never Used  . Alcohol use No     Comment: last used 25 years ago   . Drug use: Yes    Types: Cocaine     Comment: last used cocaine  and acid 25 yrs ago,none used since  . Sexual activity: Not on file   Other Topics Concern  . Not on file   Social History Narrative  . No  narrative on file      Review of Systems  Constitutional: Negative for chills, fatigue, fever and unexpected weight change.  HENT: Positive for congestion.   Eyes: Negative for pain, redness and visual disturbance.  Respiratory: Positive for apnea. Negative for cough, chest tightness and shortness of breath.   Cardiovascular: Negative for chest pain, palpitations and leg swelling.  Gastrointestinal: Negative for abdominal pain, blood in stool, nausea and vomiting.  Neurological: Negative for dizziness, light-headedness and headaches.       Objective:   Physical Exam  Constitutional: He is oriented to person, place, and time. He appears well-developed and well-nourished.  HENT:  Head: Normocephalic and atraumatic.  Edematous turbinates bilaterally.   Eyes: EOM are normal. Pupils are equal, round, and reactive to light.  Neck: No JVD present. Carotid bruit is not present.  Cardiovascular: Normal rate, regular rhythm and normal heart sounds.   No murmur heard. Pulmonary/Chest: Effort normal and breath sounds normal. He has no wheezes. He has no rales.  Lungs are clear No rhonchi.   Musculoskeletal: He exhibits no edema.  Neurological: He is alert and oriented to person, place, and time.  Skin: Skin is warm and dry.  Psychiatric: He has a normal mood and affect.  Vitals reviewed.   Vitals:   02/19/17 1007  BP: 105/60  Pulse: (!) 59  Resp: 16  Temp: 98 F (36.7 C)  TempSrc: Oral  SpO2: 94%  Weight: 216 lb 3.2 oz (98.1 kg)  Height: 5\' 11"  (1.803 m)         Assessment & Plan:    DATRON BRAKEBILL is a 66 y.o. male Essential hypertension - Plan: Comprehensive metabolic panel, amLODipine (NORVASC) 5 MG tablet  - Stable. Check labs, continue amlodipine 5 mg daily.  Daytime somnolence - Plan: Ambulatory referral to Sleep Studies Snoring - Plan: Ambulatory referral to Sleep Studies  -Refer to sleep specialist for likely sleep testing with possible  OSA.  Gastroesophageal reflux disease, esophagitis presence not specified - Plan: DISCONTINUED: dexlansoprazole (DEXILANT) 60 MG capsule  -Episodic PPI use discussed. It appears DEXilant was not covered by insurance, change to omeprazole 20 mg daily when necessary. Return or call if breakthrough symptoms on that dose.  Screening for hyperlipidemia -  Plan: Lipid panel  -If lipids elevated, may need true 8 hour fasting labs.  Meds ordered this encounter  Medications  . amLODipine (NORVASC) 5 MG tablet    Sig: Take 1 tablet (5 mg total) by mouth daily.    Dispense:  90 tablet    Refill:  1  . DISCONTD: dexlansoprazole (DEXILANT) 60 MG capsule    Sig: TAKE 1 CAPSULE (60 MG TOTAL) BY MOUTH DAILY.    Dispense:  90 capsule    Refill:  1   Patient Instructions    If you have breakthrough heartburn symptoms - you may need to return to dexilant once per day. Avoid foods that can worsen it as below.   I will check labs today, but if cholesterol is elevated, may need to repeat that testing after fasting for 8 hours.   For snoring and pausing in breathing, daytime sleepiness - I will refer you for a sleep test.   Steroid nasal spray as needed for allergies, if you have any worsening symptoms or other new symptoms, return to discuss this further.  Food Choices for Gastroesophageal Reflux Disease, Adult When you have gastroesophageal reflux disease (GERD), the foods you eat and your eating habits are very important. Choosing the right foods can help ease the discomfort of GERD. Consider working with a diet and nutrition specialist (dietitian) to help you make healthy food choices. What general guidelines should I follow? Eating plan  Choose healthy foods low in fat, such as fruits, vegetables, whole grains, low-fat dairy products, and lean meat, fish, and poultry.  Eat frequent, small meals instead of three large meals each day. Eat your meals slowly, in a relaxed setting. Avoid bending over or  lying down until 2-3 hours after eating.  Limit high-fat foods such as fatty meats or fried foods.  Limit your intake of oils, butter, and shortening to less than 8 teaspoons each day.  Avoid the following: ? Foods that cause symptoms. These may be different for different people. Keep a food diary to keep track of foods that cause symptoms. ? Alcohol. ? Drinking large amounts of liquid with meals. ? Eating meals during the 2-3 hours before bed.  Cook foods using methods other than frying. This may include baking, grilling, or broiling. Lifestyle   Maintain a healthy weight. Ask your health care provider what weight is healthy for you. If you need to lose weight, work with your health care provider to do so safely.  Exercise for at least 30 minutes on 5 or more days each week, or as told by your health care provider.  Avoid wearing clothes that fit tightly around your waist and chest.  Do not use any products that contain nicotine or tobacco, such as cigarettes and e-cigarettes. If you need help quitting, ask your health care provider.  Sleep with the head of your bed raised. Use a wedge under the mattress or blocks under the bed frame to raise the head of the bed. What foods are not recommended? The items listed may not be a complete list. Talk with your dietitian about what dietary choices are best for you. Grains Pastries or quick breads with added fat. [place] toast. Vegetables Deep fried vegetables. [place] fries. Any vegetables prepared with added fat. Any vegetables that cause symptoms. For some people this may include tomatoes and tomato products, chili peppers, onions and garlic, and horseradish. Fruits Any fruits prepared with added fat. Any fruits that cause symptoms. For some people this may include  citrus fruits, such as oranges, grapefruit, pineapple, and lemons. Meats and other protein foods High-fat meats, such as fatty beef or pork, hot dogs, ribs, ham, sausage, salami  and bacon. Fried meat or protein, including fried fish and fried chicken. Nuts and nut butters. Dairy Whole milk and chocolate milk. Sour cream. Cream. Ice cream. Cream cheese. Milk shakes. Beverages Coffee and tea, with or without caffeine. Carbonated beverages. Sodas. Energy drinks. Fruit juice made with acidic fruits (such as orange or grapefruit). Tomato juice. Alcoholic drinks. Fats and oils Butter. Margarine. Shortening. Ghee. Sweets and desserts Chocolate and cocoa. Donuts. Seasoning and other foods Pepper. Peppermint and spearmint. Any condiments, herbs, or seasonings that cause symptoms. For some people, this may include curry, hot sauce, or vinegar-based salad dressings. Summary  When you have gastroesophageal reflux disease (GERD), food and lifestyle choices are very important to help ease the discomfort of GERD.  Eat frequent, small meals instead of three large meals each day. Eat your meals slowly, in a relaxed setting. Avoid bending over or lying down until 2-3 hours after eating.  Limit high-fat foods such as fatty meat or fried foods. This information is not intended to replace advice given to you by your health care provider. Make sure you discuss any questions you have with your health care provider. Document Released: 09/04/2005 Document Revised: 09/05/2016 Document Reviewed: 09/05/2016 Elsevier Interactive Patient Education  2017 [employer].   IF you received an x-ray today, you will receive an invoice from [facility]. Please contact [facility] at [phone] with questions or concerns regarding your invoice.   IF you received labwork today, you will receive an invoice from [place]. Please contact [facility] at [phone] with questions or concerns regarding your invoice.   Our billing staff will not be able to assist you with questions regarding bills from these companies.  You will be contacted with the lab results as soon as they are  available. The fastest way to get your results is to activate your My Chart account. Instructions are located on the last page of this paperwork. If you have not heard from [place] regarding the results in 2 weeks, please contact this office.       I personally performed the services described in this documentation, which was scribed in my presence. The recorded information has been reviewed and considered for accuracy and completeness, addended by me as needed, and agree with information above.  Signed,   [name], MD Primary Care at [place].  02/21/17 3:45 PM

## 2017-02-21 ENCOUNTER — Telehealth: Payer: Self-pay

## 2017-02-21 MED ORDER — OMEPRAZOLE 20 MG PO CPDR: 20.0000 mg | DELAYED_RELEASE_CAPSULE | Freq: Every day | ORAL | 1 refills | Status: DC | PRN

## 2017-02-21 NOTE — Telephone Encounter (Signed)
PA Dexilant DENIED 1st Line Therapies Recommended  Lansoprazole 87% Omeprazole 95% Pantoprazole Sodium 95% RABEprazole Sodium 89% Esomeprazole Magnesium 82% PriLOSEC OTC 83% Prevacid 24HR 84% NexIUM 24HR 86%

## 2017-02-21 NOTE — Telephone Encounter (Signed)
Noted. Canceled order for DEXilant. Please call patient, let him know that I did have to change his medication due to insurance coverage. I sent in omeprazole 20 mg daily when necessary. If he has breakthrough or worsening heartburn symptoms with that medication, let me know and we can look into other options.

## 2017-02-27 ENCOUNTER — Encounter: Payer: Self-pay | Admitting: Student

## 2017-02-27 ENCOUNTER — Ambulatory Visit (INDEPENDENT_AMBULATORY_CARE_PROVIDER_SITE_OTHER): Payer: Managed Care, Other (non HMO) | Admitting: Student

## 2017-02-27 VITALS — BP 126/82 | HR 87 | Temp 97.9°F | Resp 16 | Ht 71.0 in | Wt 218.0 lb

## 2017-02-27 DIAGNOSIS — B370 Candidal stomatitis: Secondary | ICD-10-CM | POA: Insufficient documentation

## 2017-02-27 DIAGNOSIS — J029 Acute pharyngitis, unspecified: Secondary | ICD-10-CM

## 2017-02-27 LAB — POCT RAPID STREP A (OFFICE): Rapid Strep A Screen: NEGATIVE

## 2017-02-27 MED ORDER — NYSTATIN 100000 UNIT/ML MT SUSP: 5.0000 mL | Freq: Four times a day (QID) | OROMUCOSAL | 0 refills | Status: AC

## 2017-02-27 MED ORDER — PREDNISONE 20 MG PO TABS: ORAL_TABLET | ORAL | 0 refills | Status: DC

## 2017-02-27 NOTE — Progress Notes (Signed)
   Subjective:    Patient ID: [name], male    DOB: Dec 29, 1950, 66 y.o.   MRN: [id number]  HPI Presents with sore throat for 2 days.  Denies sick contacts, denies fevers, chills.  Has burning pain in his throat, especially when he swallows.  He feels as though his throat is closing and it is swollen.  He has a h/o COPD and takes spiriva for this, denies inhaled steroid use.  Denies cough, ear pain, nasal congestion.    PMHx - Updated and reviewed.  Contributory factors include: HTN, COPD, GERD, prostate CA, pulmonary nodules PSHx - Updated and reviewed.  Contributory factors include:  TKA FHx - Updated and reviewed.  Contributory factors include:  Negative Social Hx - Updated and reviewed. Contributory factors include: +smoking Medications - reviewed, takes spiriva    Review of Systems  Constitutional: Negative for chills, fatigue and fever.  HENT: Positive for mouth sores, sore throat and trouble swallowing. Negative for congestion, ear discharge, ear pain, nosebleeds, postnasal drip, rhinorrhea, sinus pain, sinus pressure, sneezing, tinnitus and voice change.   Eyes: Negative for pain and itching.  Respiratory: Negative for cough and shortness of breath.   Cardiovascular: Negative for chest pain and leg swelling.  Genitourinary: Negative for dysuria and urgency.  Musculoskeletal: Negative for joint swelling and myalgias.  Skin: Negative for pallor, rash and wound.  Neurological: Negative for dizziness and weakness.  All other systems reviewed and are negative.      Objective:   Physical Exam  Constitutional: He is oriented to person, place, and time. He appears well-developed and well-nourished. No distress.  HENT:  Head: Normocephalic and atraumatic.  Nose: Nose normal.  Mouth/Throat: Oropharyngeal exudate present.  Buccal mucosa with white spots bilaterally, tongue with diffuse white spots.  Pharynx with some white spots and erythema present Dentures in place  Eyes:  Conjunctivae are normal. No scleral icterus.  Neck: Normal range of motion. Neck supple.  Cardiovascular: Normal rate and regular rhythm.  Exam reveals no gallop and no friction rub.   No murmur heard. Pulmonary/Chest: Effort normal and breath sounds normal.  Musculoskeletal: He exhibits no edema or deformity.  Neurological: He is alert and oriented to person, place, and time.  Skin: Skin is warm. No rash noted. He is not diaphoretic. No erythema. No pallor.  Psychiatric: He has a normal mood and affect. His behavior is normal. Judgment and thought content normal.    BP 126/82 (BP Location: Right Arm, Patient Position: Sitting, Cuff Size: Large)   Pulse 87   Temp 97.9 F (36.6 C) (Oral)   Resp 16   Ht 5\' 11"  (1.803 m)   Wt 218 lb (98.9 kg)   SpO2 94%   BMI 30.40 kg/m        Assessment & Plan:  Thrush, oral Strep negative.  Taking Spiriva, recommend rinsing out mouth afterward using inhaler.  Treating with nystatin.  Prednisone for acute swelling and pain.  Having difficulty swallowing and breathing at night.  Gave ED precautions.  Follow up for this as needed.  Signed,  Balinda Quails, DO [facility] 6:48 PM

## 2017-02-27 NOTE — Telephone Encounter (Signed)
Left voicemail on pt cell # notifying him

## 2017-02-27 NOTE — Patient Instructions (Signed)
     IF you received an x-ray today, you will receive an invoice from [facility]. Please contact [facility] at [phone] with questions or concerns regarding your invoice.   IF you received labwork today, you will receive an invoice from [facility]. Please contact [facility] at [phone] with questions or concerns regarding your invoice.   Our billing staff will not be able to assist you with questions regarding bills from these companies.  You will be contacted with the lab results as soon as they are available. The fastest way to get your results is to activate your My Chart account. Instructions are located on the last page of this paperwork. If you have not heard from us regarding the results in 2 weeks, please contact this office.     

## 2017-02-28 NOTE — Assessment & Plan Note (Signed)
Strep negative.  Taking Spiriva, recommend rinsing out mouth afterward using inhaler.  Treating with nystatin.  Prednisone for acute swelling and pain.  Having difficulty swallowing and breathing at night.  Gave ED precautions.  Follow up for this as needed.

## 2017-03-07 ENCOUNTER — Other Ambulatory Visit: Payer: Self-pay | Admitting: Student

## 2017-03-09 ENCOUNTER — Other Ambulatory Visit: Payer: Self-pay | Admitting: Student

## 2017-03-10 ENCOUNTER — Telehealth: Payer: Self-pay | Admitting: Family Medicine

## 2017-03-10 NOTE — Telephone Encounter (Signed)
Pt is checking on status of his Nystatin refill request. Please advise at [phone]

## 2017-03-12 ENCOUNTER — Ambulatory Visit (INDEPENDENT_AMBULATORY_CARE_PROVIDER_SITE_OTHER): Payer: Managed Care, Other (non HMO) | Admitting: Emergency Medicine

## 2017-03-12 ENCOUNTER — Encounter: Payer: Self-pay | Admitting: Emergency Medicine

## 2017-03-12 VITALS — BP 116/76 | HR 99 | Ht 71.0 in | Wt 212.0 lb

## 2017-03-12 DIAGNOSIS — B370 Candidal stomatitis: Secondary | ICD-10-CM | POA: Diagnosis not present

## 2017-03-12 DIAGNOSIS — J301 Allergic rhinitis due to pollen: Secondary | ICD-10-CM | POA: Diagnosis not present

## 2017-03-12 DIAGNOSIS — J309 Allergic rhinitis, unspecified: Secondary | ICD-10-CM | POA: Insufficient documentation

## 2017-03-12 DIAGNOSIS — J449 Chronic obstructive pulmonary disease, unspecified: Secondary | ICD-10-CM

## 2017-03-12 MED ORDER — ALBUTEROL SULFATE HFA 108 (90 BASE) MCG/ACT IN AERS: 2.0000 | INHALATION_SPRAY | Freq: Four times a day (QID) | RESPIRATORY_TRACT | 6 refills | Status: DC | PRN

## 2017-03-12 MED ORDER — FLUCONAZOLE 100 MG PO TABS: 100.0000 mg | ORAL_TABLET | Freq: Every day | ORAL | 0 refills | Status: DC

## 2017-03-12 MED ORDER — LORATADINE 10 MG PO TABS: 10.0000 mg | ORAL_TABLET | Freq: Every day | ORAL | 11 refills | Status: DC

## 2017-03-12 NOTE — Assessment & Plan Note (Signed)
Start loratadine 10 mg daily 

## 2017-03-12 NOTE — Assessment & Plan Note (Signed)
He still has some areas of white plaquing on his tongue, continued thrush after nystatin treatment. Try fluconazole 100 mg daily for 3 days.

## 2017-03-12 NOTE — Progress Notes (Signed)
Subjective:    Patient ID: [name], male    DOB: 12/19/1950, 66 y.o.   MRN: [id number]  HPI 66 yo former smoker (30 pk-yrs), HTN, GERD, allergies, OA. Referred today for pulmonary nodules on CT chest.  He underwent CXR in 06/2013 that showed possible nodule, prompted CT chest that confirmed scattered sub-70mm nodules. Repeat CT 4/15 showed that the nodules are stable, also a new small area of GGI. He has some occasional wheeze, is able to stay active. He occasionally gets choked on her own secretions. He is on ACE-i.   ROV 07/20/14 -- f/u visit for hx tobacco, pulmonary nodules that have been scanned first in 10/'14, then stable 01/05/14. There was a small GGI to follow as well. He underwent PFTs in July 2015 that showed mild obstructive lung disease. CT scan from 07/13/14 is stable, both the nodules and the R basilar GG nodule. Next scan should be in 1 yr.   ROV 09/21/14 -- follow up for obstructive lung disease and pulmonary nodules that are been followed by serial CT scans. The last scan was on 07/13/14 and was stable.   ROV 08/09/15 -- follow-up visit for scattered pulmonary nodules by CT scan of the chest as well as mild obstructive lung disease in the setting of former tobacco use. Repeat CT scan of his chest was done 07/12/15 that I personally reviewed. This showed unchanged bilateral small pulmonary nodules. A 9 mm semi solid ground glass opacity in the posterior right lower lobe has resolved. He should not need any subsequent scans. He is planning to undergo R knee replacement with Dr Theda Sers. He reports some occasional exertional SOB with strenuous work, some rare cough. He hears some wheeze when he lays supine. He uses albuterol 2x a week. Hard to tell if the albuterol helps him. We did spirometry today > FEV1 2.47L (78% predicted).   ROV 10/28/15 -- follow-up visit for COPD and bilateral pulmonary nodules on CT scan of the chest that have been deemed benign with multiple serial scans.   He underwent R knee surgery 2 months ago. At his last visit we started Spiriva. He thought that it did help him some - made his breathing a bit better. He has SABA, was using it about every day but not really sure it was doing much. He is having minimal cough. He is having mid back pain, both sides, better with laying down supine. Sometimes worse with cough.   ROV 09/04/16 -- this is a follow-up visit for patient with a history of COPD. Also with benign pulmonary nodules that were followed with serial CT scans of the chest. Last seen in February of this year. At that time we continued him on Spiriva. He returns today to report . He has been treated surgically for prostate cancer. He is still having some exertional SOB with his exercise routine. Able to do his daily routine without SOB  ROV 03/12/17 -- patient has a history of COPD and benign pulmonary nodules that were followed by CT scan of the chest. He has been on Spiriva Respimat using reliably until he recently had thrush. He had recent tongue pain, whiteness, tongue swelling - dx as thrush and treated. He held the spiriva for a week, restarted today. Hasn't been rinsing. Hasn't needed albuterol. He remains active, no dyspnea. minimal cough.     Review of Systems  Constitutional: Negative for fever and unexpected weight change.  HENT: Negative for congestion, dental problem, ear pain, nosebleeds, postnasal  drip, rhinorrhea, sinus pressure, sneezing, sore throat and trouble swallowing.   Eyes: Negative for redness and itching.  Respiratory: Positive for shortness of breath and wheezing. Negative for chest tightness.   Cardiovascular: Negative for palpitations and leg swelling.       Knees   Gastrointestinal: Negative for nausea and vomiting.  Genitourinary: Negative for dysuria.  Musculoskeletal: Negative for joint swelling.  Skin: Negative for rash.  Neurological: Negative for headaches.  Hematological: Does not bruise/bleed easily.    Psychiatric/Behavioral: Negative for dysphoric mood. The patient is not nervous/anxious.         Objective:   Physical Exam Vitals:   03/12/17 1129  BP: 116/76  Pulse: 99  SpO2: 94%  Weight: 212 lb (96.2 kg)  Height: 5\' 11"  (1.803 m)   Gen: Pleasant, well-nourished, in no distress,  normal affect  ENT: No lesions,  mouth clear,  oropharynx clear, no postnasal drip  Neck: No JVD, no TMG, no carotid bruits  Lungs: No use of accessory muscles, clear without rales or rhonchi  Cardiovascular: RRR, heart sounds normal, no murmur or gallops, no peripheral edema  Musculoskeletal: No deformities, no cyanosis or clubbing  Neuro: alert, non focal  Skin: Warm, no lesions or rashes  07/13/14 --  COMPARISON: 01/05/2014  FINDINGS: Mediastinum: Normal heart size. No pericardial effusion. The trachea appears patent and is midline. Normal appearance of the esophagus. No mediastinal or hilar adenopathy identified.  Lungs/Pleura: There is no pleural effusion identified. Index nodule in the right upper lobe measures 4 mm and is unchanged from previous exam. Subpleural nodule within the anterior right upper lobe is unchanged measuring 4 mm, image 24/series 3. Subpleural right middle lobe nodule is unchanged measuring 3 mm, image 33/series 3. No new or enlarging pulmonary nodules or mass is identified. Sub solid lesion within the right base measures 9 mm, image 105 of series 602. This is stable when compared with 01/05/2014 but is new from 07/04/2013.  Upper Abdomen: The visualized portions of the liver, gallbladder, and pancreas are unremarkable. The spleen is negative.  Musculoskeletal: Review of the visualized bony structures is on unremarkable. No aggressive lytic or sclerotic bone lesions.  IMPRESSION: 1. Stable small solid pulmonary nodules. Compatible with benign lesions. 2. There is a 9 mm sub solid lesion in the right base which is unchanged from 01/05/2014 but  new from 07/04/2013. This will need annual surveillance for minimum of 36 months to document of benignity. The next followup examination should be obtained on 07/14/2015. A PET-CT would be of limited value in evaluating this lesion. This recommendation follows the consensus statement: Recommendations for the Management of Subsolid Pulmonary Nodules Detected at CT: A Statement from the [place] as published in Radiology 2013; 266:304-317.     Assessment & Plan:  COPD, mild Please continue spiriva 2 puffs once a day. Remember to rinse and gargle after using this medication. Keep albuterol available to use 2 puffs as needed Follow with Dr [name] in 6 months or sooner if you have any problems  Thrush, oral He still has some areas of white plaquing on his tongue, continued thrush after nystatin treatment. Try fluconazole 100 mg daily for 3 days.  Allergic rhinitis Start loratadine 10 mg daily  Baltazar Apo, MD, PhD 03/12/2017, 11:50 AM [facility] [phone] or if no answer [phone]

## 2017-03-12 NOTE — Patient Instructions (Signed)
Please take fluconazole 100mg  once a day for 3 days Please continue spiriva 2 puffs once a day. Remember to rinse and gargle after using this medication. Keep albuterol available to use 2 puffs as needed We will send a prescription for loratadine to your pharmacy. Take 10mg  daily Follow with Dr [name] in 6 months or sooner if you have any problems

## 2017-03-12 NOTE — Assessment & Plan Note (Signed)
Please continue spiriva 2 puffs once a day. Remember to rinse and gargle after using this medication. Keep albuterol available to use 2 puffs as needed Follow with Dr [name] in 6 months or sooner if you have any problems

## 2017-03-13 ENCOUNTER — Telehealth: Payer: Self-pay | Admitting: Emergency Medicine

## 2017-03-13 MED ORDER — LORATADINE 10 MG PO TABS
10.0000 mg | ORAL_TABLET | Freq: Every day | ORAL | 0 refills | Status: DC
Start: 2017-03-13 — End: 2017-08-15

## 2017-03-13 NOTE — Telephone Encounter (Signed)
Please advise. I do not see this medication on medication list.

## 2017-03-13 NOTE — Telephone Encounter (Signed)
Spoke with Lennette Bihari at [facility]. States that pt's insurance will cover Claritin but it has to be for a #365 tablet rx. Rx has been changed. Nothing further was needed.

## 2017-03-14 MED ORDER — CLOTRIMAZOLE 10 MG MT TROC: 10.0000 mg | Freq: Every day | OROMUCOSAL | 0 refills | Status: DC

## 2017-03-14 NOTE — Telephone Encounter (Signed)
If he is still having same white spots in mouth that look like previous thrush diagnosis, would recommend trying Mycelex troches instead of nystatin. I have sent that prescription in. If his symptoms not improve with that medication, or any worsening symptoms, then needs office visit for repeat exam

## 2017-04-02 ENCOUNTER — Institutional Professional Consult (permissible substitution): Payer: Managed Care, Other (non HMO) | Admitting: Neurology

## 2017-04-04 ENCOUNTER — Encounter: Payer: Self-pay | Admitting: Neurology

## 2017-05-28 ENCOUNTER — Institutional Professional Consult (permissible substitution): Payer: Managed Care, Other (non HMO) | Admitting: Neurology

## 2017-05-29 ENCOUNTER — Other Ambulatory Visit: Payer: Self-pay | Admitting: Family Medicine

## 2017-05-29 DIAGNOSIS — J301 Allergic rhinitis due to pollen: Secondary | ICD-10-CM

## 2017-06-06 ENCOUNTER — Telehealth: Payer: Self-pay

## 2017-06-06 NOTE — Telephone Encounter (Signed)
Dr. Carlota Raspberry, I received a denial on patient's Dexilant because they need trial of two formulary drugs before they will approve it.  I called the patient to advise him of circumstances, and he stated that he does not want the Dexilant as it is too expensive, but would like another medication because the omeprazole he is currently taking does not work for him.  His pharmacy choice is [facility]. Please advise next step. Thank you, Ottie Neglia

## 2017-06-07 MED ORDER — ESOMEPRAZOLE MAGNESIUM 40 MG PO CPDR: 40.0000 mg | DELAYED_RELEASE_CAPSULE | Freq: Every day | ORAL | 3 refills | Status: DC

## 2017-06-07 NOTE — Telephone Encounter (Signed)
We can try Nexium QD, but if that is not improving heartburn symptoms in the next 1 week, would recommend recheck in office to determine why. Additionally please ask him about any white patches or rash in mouth to make sure he does not have a recurrence of thrush, which can potentially involve the esophagus as well.

## 2017-06-11 NOTE — Telephone Encounter (Signed)
Patient was advised.  

## 2017-06-20 ENCOUNTER — Telehealth: Payer: Self-pay | Admitting: Family Medicine

## 2017-06-20 NOTE — Telephone Encounter (Signed)
Noted. Thanks.

## 2017-06-20 NOTE — Telephone Encounter (Signed)
Per note, pt states that Nexium is working fine for him.

## 2017-07-09 ENCOUNTER — Encounter: Payer: Self-pay | Admitting: Neurology

## 2017-07-09 ENCOUNTER — Ambulatory Visit (INDEPENDENT_AMBULATORY_CARE_PROVIDER_SITE_OTHER): Payer: Managed Care, Other (non HMO) | Admitting: Neurology

## 2017-07-09 ENCOUNTER — Encounter (INDEPENDENT_AMBULATORY_CARE_PROVIDER_SITE_OTHER): Payer: Self-pay

## 2017-07-09 VITALS — BP 145/87 | HR 91 | Ht 71.0 in | Wt 217.0 lb

## 2017-07-09 DIAGNOSIS — G2581 Restless legs syndrome: Secondary | ICD-10-CM | POA: Diagnosis not present

## 2017-07-09 DIAGNOSIS — R0683 Snoring: Secondary | ICD-10-CM | POA: Diagnosis not present

## 2017-07-09 DIAGNOSIS — R51 Headache: Secondary | ICD-10-CM | POA: Diagnosis not present

## 2017-07-09 DIAGNOSIS — R351 Nocturia: Secondary | ICD-10-CM

## 2017-07-09 DIAGNOSIS — E669 Obesity, unspecified: Secondary | ICD-10-CM

## 2017-07-09 DIAGNOSIS — J449 Chronic obstructive pulmonary disease, unspecified: Secondary | ICD-10-CM | POA: Diagnosis not present

## 2017-07-09 DIAGNOSIS — R0681 Apnea, not elsewhere classified: Secondary | ICD-10-CM | POA: Diagnosis not present

## 2017-07-09 DIAGNOSIS — R519 Headache, unspecified: Secondary | ICD-10-CM

## 2017-07-09 NOTE — Patient Instructions (Signed)

## 2017-07-09 NOTE — Progress Notes (Signed)
Subjective:    Patient ID: [name] is a 66 y.o. male.  HPI     Star Age, MD, PhD [facility] [address] [place], [place] [address]  Dear Dr. Carlota Raspberry,   I saw your patient, [name], upon your kind request in my neurologic clinic today for initial consultation of his sleep disorder, in particular, concern for underlying obstructive sleep apnea. The patient is unaccompanied today. Of note, patient missed an appointment on 04/02/2017. As you know, [name] is a 66 year old right-handed gentleman with an underlying complex medical history of COPD, lung nodules, hypertension, reflux disease, prostate cancer, arthritis with status post right total knee replacement, prior smoking, ED, seasonal allergies, obesity, status post multiple surgeries including hernia repairs, left knee surgery, right total knee replacement in 12/16, prostatectomy, and pelvic lymphadenectomy, who reports snoring and excessive daytime somnolence as well as witnessed apneas per wife's report. I reviewed your office note from 02/19/2017. He works at [employer] as a Scientist, water quality. His Epworth sleepiness score is 7 out of 24, fatigue score is 15 out of 63. He has some difficulty maintaining sleep. He tries to be in bed around 10:30 or 11, wakeup time around 6. He has nocturia typically once or twice per average night, he has had occasional morning headaches, is not aware of any family history of OSA. Weight has remained fairly stable. He watches TV at night and typically it turns off after the timer goes off. He typically sleeps in a separate bedroom from his wife as he snores and this disturbs his wife's sleep, she has witnessed apneic pauses while he is asleep. He has woken up with the occasional sense of gasping for air. His COPD is generally well controlled, he uses as needed inhalers including Spiriva and albuterol. He is followed by pulmonology for this. He quit smoking in  2001, he quit drinking alcohol and illicit drugs in 6761. He drinks caffeine in the form of coffee, one or 2 cups per day. He has 7 children, 23 grandchildren and 4 great-grandchildren.  His Past Medical History Is Significant For: Past Medical History:  Diagnosis Date  . Allergy    seasonal  . Arthritis    oa  . Asthma   . COPD (chronic obstructive pulmonary disease) ([place])   . GERD (gastroesophageal reflux disease)   . Hypertension   . Prostate cancer ([place]) 01/2016   prostatectomy   . Shortness of breath dyspnea     Her Past Surgical History Is Significant For: Past Surgical History:  Procedure Laterality Date  . HERNIA REPAIR  last done 3-4 yrs ago   x 2  . INGUINAL HERNIA REPAIR Right 11/20/2013   Procedure: OPEN REPAIR OF RECURRENT RIGHT INGUINAL HERNIA WITH MESH;  Surgeon: [name]. [name], MD;  Location: [facility];  Service: General;  Laterality: Right;  . INSERTION OF MESH Right 11/20/2013   Procedure: INSERTION OF MESH;  Surgeon: [name]. [name], MD;  Location: [facility];  Service: General;  Laterality: Right;  . KNEE SURGERY Left july 2014   mcl and meniscus repair  . LYMPHADENECTOMY Bilateral 02/09/2016   Procedure: PELVIC LYMPHADENECTOMY;  Surgeon: [name], MD;  Location: [facility];  Service: Urology;  Laterality: Bilateral;  . right knee arthroscopy     . ROBOT ASSISTED LAPAROSCOPIC RADICAL PROSTATECTOMY N/A 02/09/2016   Procedure: XI ROBOTIC ASSISTED LAPAROSCOPIC RADICAL PROSTATECTOMY WITH INDOCYANINE GREEN DYE AND ADHESIOLYSIS;  Surgeon: [name], MD;  Location: [facility];  Service: Urology;  Laterality: N/A;  . TOTAL KNEE ARTHROPLASTY Right 09/06/2015   Procedure: RIGHT TOTAL KNEE ARTHROPLASTY;  Surgeon: [name], MD;  Location: [facility];  Service: Orthopedics;  Laterality: Right;    His Family History Is Significant For: Family History  Problem Relation Age of Onset  . Cancer Mother   . Cancer Father   . Cancer Sister   . Arthritis Sister   . Cancer Maternal  Grandmother   . Heart disease Maternal Grandfather   . Stroke Maternal Grandfather     His Social History Is Significant For: Social History   Social History  . Marital status: Married    Spouse name: N/A  . Number of children: N/A  . Years of education: N/A   Social History Main Topics  . Smoking status: Former Smoker    Packs/day: 1.50    Years: 20.00    Types: Cigarettes    Quit date: 11/05/1999  . Smokeless tobacco: Never Used  . Alcohol use No     Comment: last used 25 years ago   . Drug use: Yes    Types: Cocaine     Comment: last used cocaine  and acid 25 yrs ago,none used since  . Sexual activity: Not Asked   Other Topics Concern  . None   Social History Narrative  . None    His Allergies Are:  Allergies  Allergen Reactions  . Percocet [Oxycodone-Acetaminophen] Other (See Comments)    Nausea-Doesn't work  . Amoxicillin Rash    Has patient had a PCN reaction causing immediate rash, facial/tongue/throat swelling, SOB or lightheadedness with hypotension: No Has patient had a PCN reaction causing severe rash involving mucus membranes or skin necrosis: No Has patient had a PCN reaction that required hospitalization No Has patient had a PCN reaction occurring within the last 10 years: Yes- 2016 If all of the above answers are "NO", then may proceed with Cephalosporin use.   :   His Current Medications Are:  Outpatient Encounter Prescriptions as of 07/09/2017  Medication Sig  . albuterol (PROVENTIL HFA;VENTOLIN HFA) 108 (90 Base) MCG/ACT inhaler Inhale 2 puffs into the lungs every 6 (six) hours as needed for wheezing or shortness of breath.  [name] Kitchen amLODipine (NORVASC) 5 MG tablet Take 1 tablet (5 mg total) by mouth daily.  . clobetasol ointment (TEMOVATE) 0.05 % Apply topically 2 (two) times daily. Apply bid to itchy rash, do not use on face, genitals, axillae (Patient taking differently: Apply 1 application topically 2 (two) times daily as needed (Apply to itchy  rash, do not use on face, genitals, axillae.). )  . clotrimazole (MYCELEX) 10 MG troche Take 1 tablet (10 mg total) by mouth 5 (five) times daily.  [name] Kitchen esomeprazole (NEXIUM) 40 MG capsule Take 1 capsule (40 mg total) by mouth daily.  . fluconazole (DIFLUCAN) 100 MG tablet Take 1 tablet (100 mg total) by mouth daily.  . fluticasone (FLONASE) 50 MCG/ACT nasal spray PLACE 2 SPRAYS INTO BOTH NOSTRILS DAILY AS NEEDED FOR ALLERGIES.  [name] Kitchen HYDROcodone-acetaminophen (NORCO) 7.5-325 MG tablet Take 1-2 tablets by mouth every 6 (six) hours as needed for moderate pain.  . Liniments (SALONPAS ARTHRITIS PAIN RELIEF) PADS Apply 1 each topically daily as needed (Pain).  [name] Kitchen loratadine (CLARITIN) 10 MG tablet Take 1 tablet (10 mg total) by mouth daily.  . methocarbamol (ROBAXIN) 500 MG tablet Take 1 tablet (500 mg total) by mouth every 6 (six) hours as needed for muscle spasms.  . Naphazoline-Pheniramine (OPCON-A) 0.027-0.315 %  SOLN Apply 1 drop to eye 2 (two) times daily as needed (dry/irritated eyes).  [name] Kitchen omeprazole (PRILOSEC) 20 MG capsule Take 1 capsule (20 mg total) by mouth daily as needed.  . Tiotropium Bromide Monohydrate (SPIRIVA RESPIMAT) 2.5 MCG/ACT AERS Inhale 2 puffs into the lungs daily.  [name] Kitchen triamcinolone cream (KENALOG) 0.1 % Apply 1 application topically 2 (two) times daily as needed. To affected areas only as needed.  . trolamine salicylate (ASPERCREME) 10 % cream Apply 1 application topically as needed for muscle pain.   No facility-administered encounter medications on file as of 07/09/2017.   :  Review of Systems:  Out of a complete 14 point review of systems, all are reviewed and negative with the exception of these symptoms as listed below: Review of Systems  Neurological:       Pt presents today to discuss his sleep. Pt has never had a sleep study but does endorse snoring.  Epworth Sleepiness Scale 0= would never doze 1= slight chance of dozing 2= moderate chance of dozing 3= high chance of  dozing  Sitting and reading: 2 Watching TV: 2 Sitting inactive in a public place (ex. Theater or meeting): 2 As a passenger in a car for an hour without a break: 0 Lying down to rest in the afternoon: 0 Sitting and talking to someone: 0 Sitting quietly after lunch (no alcohol): 1 In a car, while stopped in traffic: 0 Total: 7     Objective:  Neurological Exam  Physical Exam Physical Examination:   Vitals:   07/09/17 1101  BP: (!) 145/87  Pulse: 91   General Examination: The patient is a very pleasant 66 y.o. male in no acute distress. He appears well-developed and well-nourished and well groomed.   HEENT: Normocephalic, atraumatic, pupils are equal, round and reactive to light and accommodation. Funduscopic exam is normal with sharp disc margins noted. Extraocular tracking is good without limitation to gaze excursion or nystagmus noted. Normal smooth pursuit is noted. Hearing is grossly intact. Tympanic membranes are clear bilaterally. Face is symmetric with normal facial animation and normal facial sensation. Speech is clear with no dysarthria noted. There is no hypophonia. There is no lip, neck/head, jaw or voice tremor. Neck is supple with full range of passive and active motion. There are no carotid bruits on auscultation. Oropharynx exam reveals: mild mouth dryness, adequate dental hygiene missing teeth, partial and moderate airway crowding, due to larger uvula, tonsils of 1-2+ and smaller airway entry. Mallampati is class II. Tongue protrudes centrally and palate elevates symmetrically. Neck size is 17 3/8 inches. He has a absent overbite.   Chest: Clear to auscultation without wheezing, rhonchi or crackles noted.  Heart: S1+S2+0, regular and normal without murmurs, rubs or gallops noted.   Abdomen: Soft, non-tender and non-distended with normal bowel sounds appreciated on auscultation.  Extremities: There is no pitting edema in the distal lower extremities bilaterally. Pedal  pulses are intact.  Skin: Warm and dry without trophic changes noted. There are mild varicose veins.  Musculoskeletal: exam reveals no obvious joint deformities, tenderness or joint swelling or erythema.   Neurologically:  Mental status: The patient is awake, alert and oriented in all 4 spheres. His immediate and remote memory, attention, language skills and fund of knowledge are appropriate. There is no evidence of aphasia, agnosia, apraxia or anomia. Speech is clear with normal prosody and enunciation. Thought process is linear. Mood is normal and affect is normal.  Cranial nerves II - XII are as described  above under HEENT exam. In addition: shoulder shrug is normal with equal shoulder height noted. Motor exam: Normal bulk, strength and tone is noted. There is no drift, tremor or rebound. Romberg is negative. Reflexes are 1+ In the upper extremities, trace in the lower extremities. Fine motor skills and coordination: intact with normal finger taps, normal hand movements, normal rapid alternating patting, normal foot taps and normal foot agility.  Cerebellar testing: No dysmetria or intention tremor on finger to nose testing. Heel to shin is unremarkable bilaterally. There is no truncal or gait ataxia.  Sensory exam: intact to light touch in the upper and lower extremities.  Gait, station and balance: He stands easily. No veering to one side is noted. No leaning to one side is noted. Posture is age-appropriate and stance is narrow based. Gait shows normal stride length and normal pace. No problems turning are noted. Tandem walk is unremarkable.   Assessment and Plan:  In summary, ZAKARIA SEDOR is a very pleasant 66 y.o.-year old male with an underlying complex medical history of COPD, lung nodules, hypertension, reflux disease, prostate cancer, arthritis with status post right total knee replacement, prior smoking, ED, seasonal allergies, obesity, status post multiple surgeries including hernia  repairs, left knee surgery, right total knee replacement in 12/16, prostatectomy, and pelvic lymphadenectomy, whose history and physical exam are concerning for obstructive sleep apnea (OSA). I had a long chat with the patient about my findings and the diagnosis of OSA, its prognosis and treatment options. We talked about medical treatments, surgical interventions and non-pharmacological approaches. I explained in particular the risks and ramifications of untreated moderate to severe OSA, especially with respect to developing cardiovascular disease down the Road, including congestive heart failure, difficult to treat hypertension, cardiac arrhythmias, or stroke. Even type 2 diabetes has, in part, been linked to untreated OSA. Symptoms of untreated OSA include daytime sleepiness, memory problems, mood irritability and mood disorder such as depression and anxiety, lack of energy, as well as recurrent headaches, especially morning headaches. We talked about trying to maintain a healthy lifestyle in general, as well as the importance of weight control. I encouraged the patient to eat healthy, exercise daily and keep well hydrated, to keep a scheduled bedtime and wake time routine, to not skip any meals and eat healthy snacks in between meals. I advised the patient not to drive when feeling sleepy. I recommended the following at this time: sleep study with potential positive airway pressure titration. (We will score hypopneas at 4%).   I explained the sleep test procedure to the patient and also outlined possible surgical and non-surgical treatment options of OSA, including the use of a custom-made dental device (which would require a referral to a specialist dentist or oral surgeon), upper airway surgical options, such as pillar implants, radiofrequency surgery, tongue base surgery, and UPPP (which would involve a referral to an ENT surgeon). Rarely, jaw surgery such as mandibular advancement may be considered.  I  also explained the CPAP treatment option to the patient, who indicated that he would be willing to try CPAP if the need arises. I explained the importance of being compliant with PAP treatment, not only for insurance purposes but primarily to improve His symptoms, and for the patient's long term health benefit, including to reduce His cardiovascular risks. I answered all his questions today and the patient was in agreement. I would like to see him back after the sleep study is completed and encouraged him to call with any interim  questions, concerns, problems or updates.   Thank you very much for allowing me to participate in the care of this nice patient. If I can be of any further assistance to you please do not hesitate to call me at [phone].  Sincerely,   Star Age, MD, PhD

## 2017-08-14 ENCOUNTER — Telehealth: Payer: Self-pay | Admitting: Neurology

## 2017-08-14 NOTE — Telephone Encounter (Signed)
We have attempted to call the patient 2 times to schedule sleep study. Patient has been unavailable at the phone numbers we have on file and has not returned our calls. At this point we will send a letter asking pt to please contact the sleep lab to schedule their sleep study. If patient calls back we will schedule them for their sleep study. ° °

## 2017-08-15 ENCOUNTER — Other Ambulatory Visit: Payer: Self-pay | Admitting: Emergency Medicine

## 2017-10-27 ENCOUNTER — Other Ambulatory Visit: Payer: Self-pay | Admitting: Emergency Medicine

## 2017-11-30 ENCOUNTER — Other Ambulatory Visit: Payer: Self-pay | Admitting: Family Medicine

## 2017-12-18 ENCOUNTER — Ambulatory Visit (INDEPENDENT_AMBULATORY_CARE_PROVIDER_SITE_OTHER): Payer: Managed Care, Other (non HMO)

## 2017-12-18 ENCOUNTER — Ambulatory Visit: Payer: Managed Care, Other (non HMO) | Admitting: Family Medicine

## 2017-12-18 ENCOUNTER — Ambulatory Visit: Payer: Self-pay | Admitting: *Deleted

## 2017-12-18 ENCOUNTER — Encounter: Payer: Self-pay | Admitting: Family Medicine

## 2017-12-18 VITALS — BP 137/77 | HR 87 | Temp 98.4°F | Resp 17 | Ht 70.5 in | Wt 218.0 lb

## 2017-12-18 DIAGNOSIS — R109 Unspecified abdominal pain: Secondary | ICD-10-CM

## 2017-12-18 DIAGNOSIS — R0789 Other chest pain: Secondary | ICD-10-CM

## 2017-12-18 DIAGNOSIS — R1032 Left lower quadrant pain: Secondary | ICD-10-CM | POA: Diagnosis not present

## 2017-12-18 DIAGNOSIS — R110 Nausea: Secondary | ICD-10-CM | POA: Diagnosis not present

## 2017-12-18 DIAGNOSIS — R1012 Left upper quadrant pain: Secondary | ICD-10-CM

## 2017-12-18 LAB — POCT CBC
GRANULOCYTE PERCENT: 59.5 % (ref 37–80)
HEMATOCRIT: 48.4 % (ref 43.5–53.7)
HEMOGLOBIN: 16 g/dL (ref 14.1–18.1)
Lymph, poc: 3.1 (ref 0.6–3.4)
MCH, POC: 29.5 pg (ref 27–31.2)
MCHC: 33.2 g/dL (ref 31.8–35.4)
MCV: 88.8 fL (ref 80–97)
MID (cbc): 0.6 (ref 0–0.9)
MPV: 9.2 fL (ref 0–99.8)
POC GRANULOCYTE: 5.5 (ref 2–6.9)
POC LYMPH PERCENT: 33.5 %L (ref 10–50)
POC MID %: 7 % (ref 0–12)
Platelet Count, POC: 234 10*3/uL (ref 142–424)
RBC: 5.45 M/uL (ref 4.69–6.13)
RDW, POC: 13.5 %
WBC: 9.2 10*3/uL (ref 4.6–10.2)

## 2017-12-18 LAB — POC MICROSCOPIC URINALYSIS (UMFC): MUCUS RE: ABSENT

## 2017-12-18 LAB — POCT URINALYSIS DIP (MANUAL ENTRY)
BILIRUBIN UA: NEGATIVE
BILIRUBIN UA: NEGATIVE mg/dL
Blood, UA: NEGATIVE
Glucose, UA: NEGATIVE mg/dL
Leukocytes, UA: NEGATIVE
Nitrite, UA: NEGATIVE
PH UA: 6 (ref 5.0–8.0)
Protein Ur, POC: NEGATIVE mg/dL
SPEC GRAV UA: 1.025 (ref 1.010–1.025)
Urobilinogen, UA: 0.2 E.U./dL

## 2017-12-18 MED ORDER — TRAMADOL HCL 50 MG PO TABS: 50.0000 mg | ORAL_TABLET | Freq: Four times a day (QID) | ORAL | 0 refills | Status: DC | PRN

## 2017-12-18 MED ORDER — CYCLOBENZAPRINE HCL 5 MG PO TABS: ORAL_TABLET | ORAL | 0 refills | Status: DC

## 2017-12-18 NOTE — Telephone Encounter (Signed)
Patient is calling to report that he has had back pain for over 1 month- he started having L side pain that has become what he describes as severe. He was in a lot of pain yesterday and has called today for an appointment. He is concerned about his kidneys. He is passing urine and reports no difficulty. He had thought he had "pulled something" but the pain has not improved over time. Patient states the pain is so bad that it takes his breath at times. Appointment for evaluation today- patient advised if he should start having the severe pain he was having yesterday- he needs to go to the ED.  Reason for Disposition . [1] SEVERE back pain (e.g., excruciating, unable to do any normal activities) AND [2] not improved 2 hours after pain medicine  Answer Assessment - Initial Assessment Questions 1. ONSET: "When did the pain begin?"      Severe pain started yesterday- back pain started 4 months 2. LOCATION: "Where does it hurt?" (upper, mid or lower back)     Lower back- left side 3. SEVERITY: "How bad is the pain?"  (e.g., Scale 1-10; mild, moderate, or severe)   - MILD (1-3): doesn't interfere with normal activities    - MODERATE (4-7): interferes with normal activities or awakens from sleep    - SEVERE (8-10): excruciating pain, unable to do any normal activities      severe 4. PATTERN: "Is the pain constant?" (e.g., yes, no; constant, intermittent)      Off/on 5. RADIATION: "Does the pain shoot into your legs or elsewhere?"     Pain radiates into left leg 6. CAUSE:  "What do you think is causing the back pain?"      Patient thinks he has kidney issue or has pulled something 7. BACK OVERUSE:  "Any recent lifting of heavy objects, strenuous work or exercise?"     no 8. MEDICATIONS: "What have you taken so far for the pain?" (e.g., nothing, acetaminophen, NSAIDS)     Ibuprofen- didn't help much 9. NEUROLOGIC SYMPTOMS: "Do you have any weakness, numbness, or problems with bowel/bladder control?"     Patient reports he feels abdominal pressure- like he is going to have a bowel movement- abdominal tenderness when occurs 10. OTHER SYMPTOMS: "Do you have any other symptoms?" (e.g., fever, abdominal pain, burning with urination, blood in urine)       Feels discomfort when walking at time, dizziness at times. 11. PREGNANCY: "Is there any chance you are pregnant?" (e.g., yes, no; LMP)       n/a  Protocols used: BACK PAIN-A-AH

## 2017-12-18 NOTE — Patient Instructions (Addendum)
Blood counts look ok, but I am checking pancreas and liver tests. Pain may be related to back as well. Try muscle relaxant for back at night. Tramadol if needed for pain (do not combine with muscle relaxant).  If not improving in next 2 days, please return for repeat testing and possible CT scan. Return to the clinic or go to the nearest emergency room if any of your symptoms worsen or new symptoms occur. Ok to take Nexium for pain if needed.      IF you received an x-ray today, you will receive an invoice from [facility]. Please contact [facility] at [phone] with questions or concerns regarding your invoice.   IF you received labwork today, you will receive an invoice from [place]. Please contact [facility] at [phone] with questions or concerns regarding your invoice.   Our billing staff will not be able to assist you with questions regarding bills from these companies.  You will be contacted with the lab results as soon as they are available. The fastest way to get your results is to activate your My Chart account. Instructions are located on the last page of this paperwork. If you have not heard from [place] regarding the results in 2 weeks, please contact this office.

## 2017-12-18 NOTE — Progress Notes (Signed)
Subjective:  By signing my name below, I, [name], attest that this documentation has been prepared under the direction and in the presence of [name], MD. Electronically Signed: Moises Torres, [place]. 12/18/2017 , 6:28 PM .  Patient was seen in Room 9 .   Patient ID: [name], male    DOB: [birth year], 67 y.o.   MRN: [id number] Chief Complaint  Patient presents with  . Back Pain   HPI [name] is a 67 y.o. male Here for left mid-back pain that started about 3-4 months ago. Patient reports back pain radiating towards the left flank for about 2 weeks now, and has been having pain with twisting and turning. He also notes some pain over the area when taking a breath; more soreness in his back in past 2 weeks. He mentions having some numbness going into his left leg to his left knee from his left hip; notes little pain with walking. He states having some nausea and chills, but denies having a measured fever. He noticed abdominal distention with abdominal soreness starting about 2 days ago. He denies vomiting. He's been taking ibuprofen QD as needed. He denies hematuria or any other urinary symptoms. He denies history of kidney stones. He denies feeling short of breath or chest pain.   He has a history of prostate cancer with prostatectomy done in 2017. He is followed by [facility]. He had PSA that was undetectable in Dec 2018.   He had a CT chest done in 2016, which showed nodule on the posterior right lower lobe.   Patient Active Problem List   Diagnosis Date Noted  . Allergic rhinitis 03/12/2017  . Thrush, oral 02/27/2017  . SUI (stress urinary incontinence), male 03/20/2016  . Prostate cancer ([place]) 02/09/2016  . Obese 09/08/2015  . S/P right TKA 09/06/2015  . S/P knee replacement 09/06/2015  . 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 Pulmonary nodules 02/23/2014  . Tobacco use disorder, moderate, in sustained remission 02/23/2014  . Recurrent right inguinal  hernia 10/20/2013  . ED (erectile dysfunction) 08/26/2012  . Physical exam, annual 11/05/2011  . HTN (hypertension) 11/05/2011  . GERD (gastroesophageal reflux disease) 11/05/2011  . Chronic joint pain 11/05/2011   Past Medical History:  Diagnosis Date  . Allergy    seasonal  . Arthritis    oa  . Asthma   . COPD (chronic obstructive pulmonary disease) ([place])   . GERD (gastroesophageal reflux disease)   . Hypertension   . Prostate cancer ([place]) 01/2016   prostatectomy   . Shortness of breath dyspnea    Past Surgical History:  Procedure Laterality Date  . HERNIA REPAIR  last done 3-4 yrs ago   x 2  . INGUINAL HERNIA REPAIR Right 11/20/2013   Procedure: OPEN REPAIR OF RECURRENT RIGHT INGUINAL HERNIA WITH MESH;  Surgeon: [name]. [name], MD;  Location: [facility];  Service: General;  Laterality: Right;  . INSERTION OF MESH Right 11/20/2013   Procedure: INSERTION OF MESH;  Surgeon: [name]. [name], MD;  Location: [facility];  Service: General;  Laterality: Right;  . KNEE SURGERY Left july 2014   mcl and meniscus repair  . LYMPHADENECTOMY Bilateral 02/09/2016   Procedure: PELVIC LYMPHADENECTOMY;  Surgeon: [name], MD;  Location: [facility];  Service: Urology;  Laterality: Bilateral;  . right knee arthroscopy     . ROBOT ASSISTED LAPAROSCOPIC RADICAL PROSTATECTOMY N/A 02/09/2016   Procedure: XI ROBOTIC ASSISTED LAPAROSCOPIC RADICAL PROSTATECTOMY WITH INDOCYANINE GREEN DYE AND  ADHESIOLYSIS;  Surgeon: [name], MD;  Location: [facility];  Service: Urology;  Laterality: N/A;  . TOTAL KNEE ARTHROPLASTY Right 09/06/2015   Procedure: RIGHT TOTAL KNEE ARTHROPLASTY;  Surgeon: [name], MD;  Location: [facility];  Service: Orthopedics;  Laterality: Right;   Allergies  Allergen Reactions  . Percocet [Oxycodone-Acetaminophen] Other (See Comments)    Nausea-Doesn't work  . Amoxicillin Rash    Has patient had a PCN reaction causing immediate rash, facial/tongue/throat swelling, SOB or lightheadedness  with hypotension: No Has patient had a PCN reaction causing severe rash involving mucus membranes or skin necrosis: No Has patient had a PCN reaction that required hospitalization No Has patient had a PCN reaction occurring within the last 10 years: Yes- 2016 If all of the above answers are "NO", then may proceed with Cephalosporin use.    Prior to Admission medications   Medication Sig Start Date End Date Taking? Authorizing Provider  albuterol (PROVENTIL HFA;VENTOLIN HFA) 108 (90 Base) MCG/ACT inhaler Inhale 2 puffs into the lungs every 6 (six) hours as needed for wheezing or shortness of breath. 03/12/17   Byrum, Rose Fillers, MD  amLODipine (NORVASC) 5 MG tablet Take 1 tablet (5 mg total) by mouth daily. 02/19/17   [name], MD  clobetasol ointment (TEMOVATE) 0.05 % Apply topically 2 (two) times daily. Apply bid to itchy rash, do not use on face, genitals, axillae Patient taking differently: Apply 1 application topically 2 (two) times daily as needed (Apply to itchy rash, do not use on face, genitals, axillae.).  02/24/13   Orma Flaming, MD  clotrimazole (MYCELEX) 10 MG troche Take 1 tablet (10 mg total) by mouth 5 (five) times daily. 03/14/17   [name], MD  esomeprazole (NEXIUM) 40 MG capsule TAKE 1 CAPSULE BY MOUTH DAILY 11/30/17   [name], MD  fluconazole (DIFLUCAN) 100 MG tablet Take 1 tablet (100 mg total) by mouth daily. 03/12/17   Collene Gobble, MD  fluticasone (FLONASE) 50 MCG/ACT nasal spray PLACE 2 SPRAYS INTO BOTH NOSTRILS DAILY AS NEEDED FOR ALLERGIES. 05/29/17   [name], MD  HYDROcodone-acetaminophen (NORCO) 7.5-325 MG tablet Take 1-2 tablets by mouth every 6 (six) hours as needed for moderate pain. 02/09/16   Debbrah Alar, PA-C  KLS ALLERCLEAR 10 MG tablet TAKE 1 TABLET BY MOUTH DAILY 08/15/17   Collene Gobble, MD  Lin[facility] ARTHRITIS PAIN RELIEF) PADS Apply 1 each topically daily as needed (Pain).    [provider]    methocarbamol (ROBAXIN) 500 MG tablet Take 1 tablet (500 mg total) by mouth every 6 (six) hours as needed for muscle spasms. 09/08/15   Danae Orleans, PA-C  Naphazoline-Pheniramine (OPCON-A) 0.027-0.315 % SOLN Apply 1 drop to eye 2 (two) times daily as needed (dry/irritated eyes).    [provider]  omeprazole (PRILOSEC) 20 MG capsule Take 1 capsule (20 mg total) by mouth daily as needed. 02/21/17   [name], MD  SPIRIVA RESPIMAT 2.5 MCG/ACT AERS INHALE TWO PUFFS BY MOUTH DAILY  10/29/17   Collene Gobble, MD  Tiotropium Bromide Monohydrate (SPIRIVA RESPIMAT) 2.5 MCG/ACT AERS Inhale 2 puffs into the lungs daily.    [provider]  triamcinolone cream (KENALOG) 0.1 % Apply 1 application topically 2 (two) times daily as needed. To affected areas only as needed. 08/07/16   [name], MD  trolamine salicylate (ASPERCREME) 10 % cream Apply 1 application topically as needed for muscle pain.  [provider]   Social History   Socioeconomic History  . Marital status: Married    Spouse name: Not on file  . Number of children: Not on file  . Years of education: Not on file  . Highest education level: Not on file  Occupational History  . Not on file  Social Needs  . Financial resource strain: Not on file  . Food insecurity:    Worry: Not on file    Inability: Not on file  . Transportation needs:    Medical: Not on file    Non-medical: Not on file  Tobacco Use  . Smoking status: Former Smoker    Packs/day: 1.50    Years: 20.00    Pack years: 30.00    Types: Cigarettes    Last attempt to quit: 11/05/1999    Years since quitting: 18.1  . Smokeless tobacco: Never Used  Substance and Sexual Activity  . Alcohol use: No    Comment: last used 25 years ago   . Drug use: Yes    Types: Cocaine    Comment: last used cocaine  and acid 25 yrs ago,none used since  . Sexual activity: Not on file  Lifestyle  . Physical activity:    Days per week: Not  on file    Minutes per session: Not on file  . Stress: Not on file  Relationships  . Social connections:    Talks on phone: Not on file    Gets together: Not on file    Attends religious service: Not on file    Active member of club or organization: Not on file    Attends meetings of clubs or organizations: Not on file    Relationship status: Not on file  . Intimate partner violence:    Fear of current or ex partner: Not on file    Emotionally abused: Not on file    Physically abused: Not on file    Forced sexual activity: Not on file  Other Topics Concern  . Not on file  Social History Narrative  . Not on file   Review of Systems  Constitutional: Positive for chills. Negative for fatigue, fever and unexpected weight change.  Eyes: Negative for visual disturbance.  Respiratory: Negative for cough, chest tightness and shortness of breath.   Cardiovascular: Negative for chest pain, palpitations and leg swelling.  Gastrointestinal: Positive for abdominal distention, abdominal pain (soreness) and nausea. Negative for Torres in stool and vomiting.  Genitourinary: Positive for flank pain (left). Negative for hematuria.  Musculoskeletal: Positive for back pain.  Neurological: Positive for numbness. Negative for dizziness, light-headedness and headaches.       Objective:   Physical Exam  Constitutional: He is oriented to person, place, and time. He appears well-developed and well-nourished. No distress.  HENT:  Head: Normocephalic and atraumatic.  Eyes: Pupils are equal, round, and reactive to light. EOM are normal.  Neck: Neck supple.  Cardiovascular: Normal rate.  Pulmonary/Chest: Effort normal. No respiratory distress.  Abdominal: There is tenderness in the left upper quadrant and left lower quadrant.  Tender into LUQ and left flank, slight pain over LLQ, LUQ and left flank, pressure sensation throughout abdomen; prominent abdomen  Musculoskeletal: Normal range of motion.   Lumbar spine: tender along the left lower paraspinals along with the left lower rib margin and mid axillary; full flexion and minimally decreased extension, pain with left lateral flexion; able to heel-toe walk; well-healed scar over right knee Left hip: slight discomfort  into the left hip with internal and external rotation, negative seated straight leg raise  Neurological: He is alert and oriented to person, place, and time.  Reflex Scores:      Patellar reflexes are 2+ on the right side and 2+ on the left side.      Achilles reflexes are 2+ on the right side and 2+ on the left side. Skin: Skin is warm and dry.  Psychiatric: He has a normal mood and affect. His behavior is normal.  Nursing note and vitals reviewed.   Vitals:   12/18/17 1728  BP: 137/77  Pulse: 87  Resp: 17  Temp: 98.4 F (36.9 C)  TempSrc: Oral  SpO2: 98%  Weight: 218 lb (98.9 kg)  Height: 5' 10.5" (1.791 m)   Results for orders placed or performed in visit on 12/18/17  POCT CBC  Result Value Ref Range   WBC 9.2 4.6 - 10.2 K/uL   Lymph, poc 3.1 0.6 - 3.4   POC LYMPH PERCENT 33.5 10 - 50 %L   MID (cbc) 0.6 0 - 0.9   POC MID % 7.0 0 - 12 %M   POC Granulocyte 5.5 2 - 6.9   Granulocyte percent 59.5 37 - 80 %G   RBC 5.45 4.69 - 6.13 M/uL   Hemoglobin 16.0 14.1 - 18.1 g/dL   HCT, POC 48.4 43.5 - 53.7 %   MCV 88.8 80 - 97 fL   MCH, POC 29.5 27 - 31.2 pg   MCHC 33.2 31.8 - 35.4 g/dL   RDW, POC 13.5 %   Platelet Count, POC 234 142 - 424 K/uL   MPV 9.2 0 - 99.8 fL  POCT urinalysis dipstick  Result Value Ref Range   Color, UA yellow yellow   Clarity, UA clear clear   Glucose, UA negative negative mg/dL   Bilirubin, UA negative negative   Ketones, POC UA negative negative mg/dL   Spec Grav, UA 1.025 1.010 - 1.025   Torres, UA negative negative   pH, UA 6.0 5.0 - 8.0   Protein Ur, POC negative negative mg/dL   Urobilinogen, UA 0.2 0.2 or 1.0 E.U./dL   Nitrite, UA Negative Negative   Leukocytes, UA  Negative Negative  POCT Microscopic Urinalysis ([facility])  Result Value Ref Range   WBC,UR,HPF,POC None None WBC/hpf   RBC,UR,HPF,POC None None RBC/hpf   Bacteria None None, Too numerous to count   Mucus Absent Absent   Epithelial Cells, UR Per Microscopy None None, Too numerous to count cells/hpf   Dg Ribs Unilateral W/chest Left  Result Date: 12/18/2017 CLINICAL DATA:  Nausea with left lower quadrant pain EXAM: LEFT RIBS AND CHEST - 3+ VIEW COMPARISON:  CT 07/12/2015, radiograph 06/23/2013 FINDINGS: No acute pulmonary infiltrate or effusion. Stable left lung base nodule. Stable cardiomediastinal silhouette. Left rib series demonstrates no acute displaced left rib fracture IMPRESSION: 1. Negative for pneumothorax or pleural effusion 2. Negative for acute displaced left rib fracture Electronically Signed   By: Donavan Foil M.D.   On: 12/18/2017 18:51       Assessment & Plan:   [name] is a 67 y.o. male LLQ pain - Plan: POCT CBC  LUQ pain - Plan: POCT CBC, Lipase  Left flank pain - Plan: POCT urinalysis dipstick, POCT Microscopic Urinalysis ([facility])  Left-sided chest wall pain - Plan: DG Ribs Unilateral W/Chest Left  Nausea without vomiting - Plan: Lipase, Comprehensive metabolic panel  Differential includes muscular pain versus abdominal source.  Reassuring CBC,  urinalysis.  Advised to restart PPI for possible gastritis cause, check CMP, lipase, trial of muscle relaxant at bedtime, tramadol if needed for pain.  Recheck in 48 hours.  ER/RTC precautions if worsening sooner  Meds ordered this encounter  Medications  . cyclobenzaprine (FLEXERIL) 5 MG tablet    Sig: 1 pill by mouth each bedtime as needed.    Dispense:  15 tablet    Refill:  0  . traMADol (ULTRAM) 50 MG tablet    Sig: Take 1 tablet (50 mg total) by mouth every 6 (six) hours as needed.    Dispense:  20 tablet    Refill:  0   Patient Instructions   Torres counts look ok, but I am checking pancreas and liver  tests. Pain may be related to back as well. Try muscle relaxant for back at night. Tramadol if needed for pain (do not combine with muscle relaxant).  If not improving in next 2 days, please return for repeat testing and possible CT scan. Return to the clinic or go to the nearest emergency room if any of your symptoms worsen or new symptoms occur. Ok to take Nexium for pain if needed.      IF you received an x-ray today, you will receive an invoice from [facility]. Please contact [facility] at [phone] with questions or concerns regarding your invoice.   IF you received labwork today, you will receive an invoice from [place]. Please contact [facility] at [phone] with questions or concerns regarding your invoice.   Our billing staff will not be able to assist you with questions regarding bills from these companies.  You will be contacted with the lab results as soon as they are available. The fastest way to get your results is to activate your My Chart account. Instructions are located on the last page of this paperwork. If you have not heard from [place] regarding the results in 2 weeks, please contact this office.       I personally performed the services described in this documentation, which was scribed in my presence. The recorded information has been reviewed and considered for accuracy and completeness, addended by me as needed, and agree with information above.  Signed,   [name], MD [facility] [place].  12/20/17 10:18 AM

## 2017-12-19 LAB — COMPREHENSIVE METABOLIC PANEL
A/G RATIO: 2 (ref 1.2–2.2)
ALBUMIN: 4.6 g/dL (ref 3.6–4.8)
ALT: 23 IU/L (ref 0–44)
AST: 11 IU/L (ref 0–40)
Alkaline Phosphatase: 96 IU/L (ref 39–117)
BUN / CREAT RATIO: 15 (ref 10–24)
BUN: 14 mg/dL (ref 8–27)
Bilirubin Total: 0.7 mg/dL (ref 0.0–1.2)
CALCIUM: 9.4 mg/dL (ref 8.6–10.2)
CO2: 24 mmol/L (ref 20–29)
Chloride: 100 mmol/L (ref 96–106)
Creatinine, Ser: 0.95 mg/dL (ref 0.76–1.27)
GFR calc Af Amer: 96 mL/min/{1.73_m2} (ref >59)
GFR, EST NON AFRICAN AMERICAN: 83 mL/min/{1.73_m2} (ref >59)
GLOBULIN, TOTAL: 2.3 g/dL (ref 1.5–4.5)
Glucose: 80 mg/dL (ref 65–99)
POTASSIUM: 4.2 mmol/L (ref 3.5–5.2)
SODIUM: 141 mmol/L (ref 134–144)
Total Protein: 6.9 g/dL (ref 6.0–8.5)

## 2017-12-19 LAB — LIPASE: Lipase: 29 U/L (ref 13–78)

## 2017-12-20 ENCOUNTER — Ambulatory Visit
Admission: RE | Admit: 2017-12-20 | Discharge: 2017-12-20 | Disposition: A | Discharge status: Home / Self Care | Payer: Managed Care, Other (non HMO) | Source: Ambulatory Visit | Attending: Family Medicine | Admitting: Family Medicine

## 2017-12-20 ENCOUNTER — Encounter: Payer: Self-pay | Admitting: Family Medicine

## 2017-12-20 ENCOUNTER — Ambulatory Visit: Payer: Managed Care, Other (non HMO) | Admitting: Family Medicine

## 2017-12-20 ENCOUNTER — Ambulatory Visit (INDEPENDENT_AMBULATORY_CARE_PROVIDER_SITE_OTHER): Payer: Managed Care, Other (non HMO)

## 2017-12-20 ENCOUNTER — Other Ambulatory Visit: Payer: Self-pay

## 2017-12-20 VITALS — BP 122/72 | HR 71 | Temp 98.1°F | Ht 71.0 in | Wt 220.4 lb

## 2017-12-20 DIAGNOSIS — R200 Anesthesia of skin: Secondary | ICD-10-CM

## 2017-12-20 DIAGNOSIS — R109 Unspecified abdominal pain: Secondary | ICD-10-CM

## 2017-12-20 DIAGNOSIS — M549 Dorsalgia, unspecified: Secondary | ICD-10-CM

## 2017-12-20 DIAGNOSIS — G5602 Carpal tunnel syndrome, left upper limb: Secondary | ICD-10-CM | POA: Diagnosis not present

## 2017-12-20 DIAGNOSIS — M542 Cervicalgia: Secondary | ICD-10-CM

## 2017-12-20 DIAGNOSIS — R1032 Left lower quadrant pain: Secondary | ICD-10-CM | POA: Diagnosis not present

## 2017-12-20 DIAGNOSIS — M545 Low back pain: Secondary | ICD-10-CM | POA: Diagnosis not present

## 2017-12-20 DIAGNOSIS — K5732 Diverticulitis of large intestine without perforation or abscess without bleeding: Secondary | ICD-10-CM | POA: Diagnosis not present

## 2017-12-20 LAB — POCT CBC
GRANULOCYTE PERCENT: 59 % (ref 37–80)
HEMATOCRIT: 45.6 % (ref 43.5–53.7)
Hemoglobin: 15.4 g/dL (ref 14.1–18.1)
Lymph, poc: 2.1 (ref 0.6–3.4)
MCH: 29.9 pg (ref 27–31.2)
MCHC: 33.7 g/dL (ref 31.8–35.4)
MCV: 88.7 fL (ref 80–97)
MID (CBC): 0.3 (ref 0–0.9)
MPV: 8.9 fL (ref 0–99.8)
POC GRANULOCYTE: 3.5 (ref 2–6.9)
POC LYMPH PERCENT: 36.2 %L (ref 10–50)
POC MID %: 4.8 %M (ref 0–12)
Platelet Count, POC: 267 10*3/uL (ref 142–424)
RBC: 5.14 M/uL (ref 4.69–6.13)
RDW, POC: 13.2 %
WBC: 5.9 10*3/uL (ref 4.6–10.2)

## 2017-12-20 MED ORDER — IOPAMIDOL (ISOVUE-300) INJECTION 61%
125.0000 mL | Freq: Once | INTRAVENOUS | Status: AC | PRN
  Administered 2017-12-20: 125 mL via INTRAVENOUS

## 2017-12-20 NOTE — Patient Instructions (Addendum)
  I will check xray of your back today. Please return to discuss leg, arm wrist and neck symptoms further. You do appear to have some possible nerve symptoms from the neck and possible carpal tunnel syndrome on the left. You can wear an over the counter wrist brace for now, but return in the next week for possible xrays and to discuss those areas further.  Return to the clinic or go to the nearest emergency room if any of your symptoms worsen or new symptoms occur.  I will check a CT scan today to look at intestines and to rule out a kidney stone or intestinal infection. Ok to continue tramadol as needed for pain for now.    [place]  IF you received an x-ray today, you will receive an invoice from [facility]. Please contact [facility] at [phone] with questions or concerns regarding your invoice.   IF you received labwork today, you will receive an invoice from [place]. Please contact [facility] at [phone] with questions or concerns regarding your invoice.   Our billing staff will not be able to assist you with questions regarding bills from these companies.  You will be contacted with the lab results as soon as they are available. The fastest way to get your results is to activate your My Chart account. Instructions are located on the last page of this paperwork. If you have not heard from [place] regarding the results in 2 weeks, please contact this office.

## 2017-12-20 NOTE — Progress Notes (Signed)
Subjective:    Patient ID: [name], male    DOB: Jan 24, 1951, 67 y.o.   MRN: [id number]  HPI [name] is a 67 y.o. male Presents today for: Chief Complaint  Patient presents with  . Left Side Pain    yesterday better, today pain in stomach and back around left side area (pain level 7-8 out of 10)   Here for follow-up of left back to flank pain, left upper quadrant abdominal pain.  Seen 2 days ago.  At that time he was afebrile, left upper quadrant, left flank tenderness.  Some reproduction of symptoms with rotation/flexion of back.  Urinalysis without hematuria.  He had a normal CBC with WBC 9.2.  Normal CMP, normal lipase.  No pneumothorax or pleural effusion, no apparent displaced left rib fracture on rib series at last visit.  Reports improvement of pain yesterday, pain has returned somewhat today on the left side. Still some soreness in L back that radiates to left side, front of abdomen and into left scrotum. No known history of kidney stone. Sharp pain at times, but feels like a constant pain.  Initially just in back past 3-4 months, then radiating pain past few days from left flank. Took tramadol for pain - only once last night. Has not taken the flexeril.  Bowel movements have been normal.   Occasional numbness in left hand or left leg past few weeks, left hand feels a little weak at times only - not constant. Comes and goes. Same with  No bowel or bladder incontinence, no saddle anesthesia, no lower extremity weakness to point of needing cane/crutch. Did have a slip down stairs about 3 weeks ago. Improved in few minutes. Works at Product manager at [employer]. Some heavy lifting at times with cases of water, large items at times. No treatments.   Review of Systems As in HPI.     Objective:   Physical Exam  Constitutional: He is oriented to person, place, and time. He appears well-developed and well-nourished.  HENT:  Head: Normocephalic and atraumatic.  Eyes: Pupils are  equal, round, and reactive to light. EOM are normal.  Neck: No JVD present. Carotid bruit is not present.  Cardiovascular: Normal rate, regular rhythm and normal heart sounds.  No murmur heard. Pulmonary/Chest: Effort normal and breath sounds normal. He has no rales.  Abdominal: Soft. Bowel sounds are normal. There is tenderness. There is no rebound and no guarding.  Tender palpation left flank, CVA area, left lower quadrant, no apparent hernia, inguinal canals nontender  Musculoskeletal: He exhibits no edema.       Left wrist: He exhibits normal range of motion, no tenderness and no bony tenderness.       Cervical back: He exhibits decreased range of motion (min decreased ROM with some dysesthesias into left upper arm with lateral flexion. ). He exhibits no bony tenderness.  Negative seated SLR on left.   Left wrist: negative tinel. positive phalen. No weakness.no thenar wasting.   Full shoulder ROM.    Neurological: He is alert and oriented to person, place, and time.  Equal grip strength, normal LE strength - equal right and left. Able to heel and toe walk without difficulty.   Skin: Skin is warm and dry.  Psychiatric: He has a normal mood and affect.  Vitals reviewed.  Vitals:   12/20/17 0926  BP: 122/72  Pulse: 71  Temp: 98.1 F (36.7 C)  TempSrc: Oral  SpO2: 93%  Weight: 220 lb 6.4  oz (100 kg)  Height: 5\' 11"  (1.803 m)      Results for orders placed or performed in visit on 12/20/17  POCT CBC  Result Value Ref Range   WBC 5.9 4.6 - 10.2 K/uL   Lymph, poc 2.1 0.6 - 3.4   POC LYMPH PERCENT 36.2 10 - 50 %L   MID (cbc) 0.3 0 - 0.9   POC MID % 4.8 0 - 12 %M   POC Granulocyte 3.5 2 - 6.9   Granulocyte percent 59.0 37 - 80 %G   RBC 5.14 4.69 - 6.13 M/uL   Hemoglobin 15.4 14.1 - 18.1 g/dL   HCT, POC 45.6 43.5 - 53.7 %   MCV 88.7 80 - 97 fL   MCH, POC 29.9 27 - 31.2 pg   MCHC 33.7 31.8 - 35.4 g/dL   RDW, POC 13.2 %   Platelet Count, POC 267 142 - 424 K/uL   MPV  8.9 0 - 99.8 fL   Results for orders placed or performed in visit on 12/20/17  POCT CBC  Result Value Ref Range   WBC 5.9 4.6 - 10.2 K/uL   Lymph, poc 2.1 0.6 - 3.4   POC LYMPH PERCENT 36.2 10 - 50 %L   MID (cbc) 0.3 0 - 0.9   POC MID % 4.8 0 - 12 %M   POC Granulocyte 3.5 2 - 6.9   Granulocyte percent 59.0 37 - 80 %G   RBC 5.14 4.69 - 6.13 M/uL   Hemoglobin 15.4 14.1 - 18.1 g/dL   HCT, POC 45.6 43.5 - 53.7 %   MCV 88.7 80 - 97 fL   MCH, POC 29.9 27 - 31.2 pg   MCHC 33.7 31.8 - 35.4 g/dL   RDW, POC 13.2 %   Platelet Count, POC 267 142 - 424 K/uL   MPV 8.9 0 - 99.8 fL  Dg Thoracic Spine 2 View  Result Date: 12/20/2017 CLINICAL DATA:  Dorsalgia EXAM: THORACIC SPINE 2 VIEWS COMPARISON:  Chest radiograph June 23, 2013 FINDINGS: Frontal and lateral views were obtained. No fracture or spondylolisthesis. There is disc space narrowing at several levels in the mid to lower thoracic region with several anterior and lateral osteophytes. No erosive change or paraspinous lesions. 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 No fracture or spondylolisthesis. Electronically Signed   By: Lowella Grip III M.D.   On: 12/20/2017 11:28   Dg Lumbar Spine Complete  Result Date: 12/20/2017 CLINICAL DATA:  Lumbago with left-sided radicular symptoms EXAM: LUMBAR SPINE - COMPLETE 4+ VIEW COMPARISON:  None. FINDINGS: Frontal, lateral, spot lumbosacral lateral, and bilateral oblique views were obtained. There are 5 non-rib-bearing lumbar type vertebral bodies. There is no fracture or spondylolisthesis. There is moderate disc space narrowing at L2-3. There is milder disc space narrowing at L3-4 and L5-S1. There are foci of vacuum phenomenon at L2-3, L3-4, L4-5. There are prominent anterior osteophytes at L3, L4, and L5. There is facet osteoarthritic change at L5-S1 bilaterally. There is aortic atherosclerosis. IMPRESSION: Osteoarthritic change at several levels. No fracture or spondylolisthesis.  There is aortic atherosclerosis. Aortic Atherosclerosis (ICD10-I70.0). Electronically Signed   By: Lowella Grip III M.D.   On: 12/20/2017 11:27   Ct Abdomen Pelvis W Contrast  Result Date: 12/20/2017 CLINICAL DATA:  Left lower quadrant abdominal pain for several months. EXAM: CT ABDOMEN AND PELVIS WITH CONTRAST TECHNIQUE: Multidetector CT imaging of the abdomen and pelvis was performed using the standard protocol following bolus administration of intravenous  contrast. CONTRAST:  133mL ISOVUE-300 IOPAMIDOL (ISOVUE-300) INJECTION 61% COMPARISON:  CT scan of Jan 21, 2016. FINDINGS: Lower chest: No acute abnormality. Hepatobiliary: No focal liver abnormality is seen. No gallstones, gallbladder wall thickening, or biliary dilatation. Pancreas: Unremarkable. No pancreatic ductal dilatation or surrounding inflammatory changes. Spleen: Normal in size without focal abnormality. Adrenals/Urinary Tract: Adrenal glands appear normal. Stable right renal cysts are noted. No hydronephrosis or renal obstruction is noted. Urinary bladder is decompressed. No renal or ureteral calculi are noted. Stomach/Bowel: The stomach and appendix appear normal. Focal diverticulitis is noted in the distal descending colon. Vascular/Lymphatic: Aortic atherosclerosis. No enlarged abdominal or pelvic lymph nodes. Reproductive: Status post prostatectomy. Other: No abdominal wall hernia or abnormality. No abdominopelvic ascites. 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 No acute abnormality is noted. IMPRESSION: Focal diverticulitis is noted in the distal descending colon. No abscess is noted. 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 Electronically Signed   By: Marijo Conception, M.D.   On: 12/20/2017 13:35       Assessment & Plan:  [name] is a 67 y.o. male Left flank pain - Plan: POCT CBC, CT Abdomen Pelvis W Contrast Left low back  pain, unspecified chronicity, with sciatica presence unspecified - Plan: DG Lumbar Spine Complete LLQ abdominal pain - Plan: CT Abdomen Pelvis W Contrast Left inguinal pain Mid back pain on left side - Plan: DG Thoracic Spine 2 View  -CT scan obtained, noted results as above with diverticulitis.  Called patient on April 5th.  No significant changes, minimal nausea, no fevers.  Results discussed, plan to start Cipro and Flagyl with repeat exam on Monday.  Over the weekend ER precautions discussed if any worsening symptoms.  Left carpal tunnel syndrome Neck pain Left arm numbness   Possible neuronal impingement from neck versus carpal tunnel syndrome versus both.  Over-the-counter wrist brace as needed for now, recheck in the next week to 2 to determine next step.No orders of the defined types were placed in this encounter.  Patient Instructions    I will check xray of your back today. Please return to discuss leg, arm wrist and neck symptoms further. You do appear to have some possible nerve symptoms from the neck and possible carpal tunnel syndrome on the left. You can wear an over the counter wrist brace for now, but return in the next week for possible xrays and to discuss those areas further.  Return to the clinic or go to the nearest emergency room if any of your symptoms worsen or new symptoms occur.  I will check a CT scan today to look at intestines and to rule out a kidney stone or intestinal infection. Ok to continue tramadol as needed for pain for now.    [place]  IF you received an x-ray today, you will receive an invoice from [facility]. Please contact [facility] at [phone] with questions or concerns regarding your invoice.   IF you received labwork today, you will receive an invoice from [place]. Please contact [facility] at [phone] with questions or concerns regarding your invoice.   Our billing staff will not  be able to assist you with questions regarding bills from these companies.  You will be contacted with the lab results as soon as they are available. The fastest way to get your results is to activate your My Chart account. Instructions are located on the  last page of this paperwork. If you have not heard from [place] regarding the results in 2 weeks, please contact this office.       Signed,   [name], MD [facility] [place].  12/21/17 4:01 PM

## 2017-12-21 ENCOUNTER — Encounter: Payer: Self-pay | Admitting: Family Medicine

## 2017-12-21 MED ORDER — METRONIDAZOLE 500 MG PO TABS: ORAL_TABLET | ORAL | 0 refills | Status: DC

## 2017-12-21 MED ORDER — CIPROFLOXACIN HCL 500 MG PO TABS: 500.0000 mg | ORAL_TABLET | Freq: Two times a day (BID) | ORAL | 0 refills | Status: DC

## 2017-12-24 ENCOUNTER — Encounter: Payer: Self-pay | Admitting: Family Medicine

## 2017-12-24 ENCOUNTER — Telehealth: Payer: Self-pay

## 2017-12-24 ENCOUNTER — Ambulatory Visit: Payer: Managed Care, Other (non HMO) | Admitting: Family Medicine

## 2017-12-24 ENCOUNTER — Other Ambulatory Visit: Payer: Self-pay

## 2017-12-24 VITALS — BP 130/80 | HR 114 | Temp 98.0°F | Ht 71.0 in | Wt 217.2 lb

## 2017-12-24 DIAGNOSIS — R197 Diarrhea, unspecified: Secondary | ICD-10-CM | POA: Diagnosis not present

## 2017-12-24 DIAGNOSIS — R112 Nausea with vomiting, unspecified: Secondary | ICD-10-CM | POA: Diagnosis not present

## 2017-12-24 DIAGNOSIS — K5792 Diverticulitis of intestine, part unspecified, without perforation or abscess without bleeding: Secondary | ICD-10-CM | POA: Diagnosis not present

## 2017-12-24 LAB — POCT CBC
Granulocyte percent: 55 %G (ref 37–80)
HEMATOCRIT: 52.5 % (ref 43.5–53.7)
Hemoglobin: 16.6 g/dL (ref 14.1–18.1)
LYMPH, POC: 2.3 (ref 0.6–3.4)
MCH, POC: 28.9 pg (ref 27–31.2)
MCHC: 31.7 g/dL — AB (ref 31.8–35.4)
MCV: 91.4 fL (ref 80–97)
MID (cbc): 0.4 (ref 0–0.9)
MPV: 9.3 fL (ref 0–99.8)
POC GRANULOCYTE: 3.2 (ref 2–6.9)
POC LYMPH %: 38.3 % (ref 10–50)
POC MID %: 6.7 %M (ref 0–12)
Platelet Count, POC: 303 10*3/uL (ref 142–424)
RBC: 5.75 M/uL (ref 4.69–6.13)
RDW, POC: 13.5 %
WBC: 5.9 10*3/uL (ref 4.6–10.2)

## 2017-12-24 MED ORDER — ONDANSETRON HCL 4 MG PO TABS: 4.0000 mg | ORAL_TABLET | Freq: Three times a day (TID) | ORAL | 0 refills | Status: DC | PRN

## 2017-12-24 NOTE — Telephone Encounter (Signed)
Results discussed April 5th - see office notes.

## 2017-12-24 NOTE — Telephone Encounter (Signed)
Copied from [place] [phone] [phone]. Topic: Quick Communication - Other Results >> Dec 21, 2017  1:15 PM Lennox Solders wrote: Pt would like ct scan results. Pt had ct scan done at [facility] imaging

## 2017-12-24 NOTE — Progress Notes (Signed)
Subjective:  By signing my name below, I, [name], attest that this documentation has been prepared under the direction and in the presence of [name], MD Electronically Signed: Ladene Artist, ED Scribe 12/24/2017 at 12:49 PM.   Patient ID: [name], male    DOB: 04/19/1951, 67 y.o.   MRN: [id number]  Chief Complaint  Patient presents with  . Abdominal Pain    nausa, vomit, headache, diarreha, lower back pain now.      HPI Pedro JUNCAJ "[name]" is a 67 y.o. male who presents to Primary Care at [facility] for f/u of lower abdominal pain and flank pain. Last seen 4/4. CT abdomen/pelvis ordered indicating diverticulitis. Called 4/5 to start Cipro and flagyl. Was having some low back pain/flank pain previous visit with normal lipase, reassuring UA. degenerative changes at thoracic and lumbar spine.  Pt started medication late in the evening on 4/5; he has had his sixth dose. States he felt a little better yesterday but today he reports improving abdominal pain, nausea, vomiting, diarrhea, low back pain, HA, subjective fever. He has had 4 episodes of diarrhea onset 4 AM this morning and 1 episode of emesis. Pt has been taking Tramadol bid with some relief. Denies blood in stools, bladder/bowel incontinence, difficulty urinating.  Patient Active Problem List   Diagnosis Date Noted  . Allergic rhinitis 03/12/2017  . Thrush, oral 02/27/2017  . SUI (stress urinary incontinence), male 03/20/2016  . Prostate cancer ([place]) 02/09/2016  . Obese 09/08/2015  . S/P right TKA 09/06/2015  . S/P knee replacement 09/06/2015  . 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 Pulmonary nodules 02/23/2014  . Tobacco use disorder, moderate, in sustained remission 02/23/2014  . Recurrent right inguinal hernia 10/20/2013  . ED (erectile dysfunction) 08/26/2012  . Physical exam, annual 11/05/2011  . HTN (hypertension) 11/05/2011  . GERD (gastroesophageal reflux disease) 11/05/2011  . Chronic joint pain  11/05/2011   Past Medical History:  Diagnosis Date  . Allergy    seasonal  . Arthritis    oa  . Asthma   . COPD (chronic obstructive pulmonary disease) ([place])   . GERD (gastroesophageal reflux disease)   . Hypertension   . Prostate cancer ([place]) 01/2016   prostatectomy   . Shortness of breath dyspnea    Past Surgical History:  Procedure Laterality Date  . HERNIA REPAIR  last done 3-4 yrs ago   x 2  . INGUINAL HERNIA REPAIR Right 11/20/2013   Procedure: OPEN REPAIR OF RECURRENT RIGHT INGUINAL HERNIA WITH MESH;  Surgeon: [name]. [name], MD;  Location: [facility];  Service: General;  Laterality: Right;  . INSERTION OF MESH Right 11/20/2013   Procedure: INSERTION OF MESH;  Surgeon: [name]. [name], MD;  Location: [facility];  Service: General;  Laterality: Right;  . KNEE SURGERY Left july 2014   mcl and meniscus repair  . LYMPHADENECTOMY Bilateral 02/09/2016   Procedure: PELVIC LYMPHADENECTOMY;  Surgeon: [name], MD;  Location: [facility];  Service: Urology;  Laterality: Bilateral;  . right knee arthroscopy     . ROBOT ASSISTED LAPAROSCOPIC RADICAL PROSTATECTOMY N/A 02/09/2016   Procedure: XI ROBOTIC ASSISTED LAPAROSCOPIC RADICAL PROSTATECTOMY WITH INDOCYANINE GREEN DYE AND ADHESIOLYSIS;  Surgeon: [name], MD;  Location: [facility];  Service: Urology;  Laterality: N/A;  . TOTAL KNEE ARTHROPLASTY Right 09/06/2015   Procedure: RIGHT TOTAL KNEE ARTHROPLASTY;  Surgeon: [name], MD;  Location: [facility];  Service: Orthopedics;  Laterality: Right;   Allergies  Allergen Reactions  .  Percocet [Oxycodone-Acetaminophen] Other (See Comments)    Nausea-Doesn't work  . Amoxicillin Rash    Has patient had a PCN reaction causing immediate rash, facial/tongue/throat swelling, SOB or lightheadedness with hypotension: No Has patient had a PCN reaction causing severe rash involving mucus membranes or skin necrosis: No Has patient had a PCN reaction that required hospitalization No Has patient had a PCN  reaction occurring within the last 10 years: Yes- 2016 If all of the above answers are "NO", then may proceed with Cephalosporin use.    Prior to Admission medications   Medication Sig Start Date End Date Taking? Authorizing Provider  albuterol (PROVENTIL HFA;VENTOLIN HFA) 108 (90 Base) MCG/ACT inhaler Inhale 2 puffs into the lungs every 6 (six) hours as needed for wheezing or shortness of breath. 03/12/17  Yes Byrum, Rose Fillers, MD  amLODipine (NORVASC) 5 MG tablet Take 1 tablet (5 mg total) by mouth daily. 02/19/17  Yes [name], MD  ciprofloxacin (CIPRO) 500 MG tablet Take 1 tablet (500 mg total) by mouth 2 (two) times daily. 12/21/17  Yes [name], MD  clobetasol ointment (TEMOVATE) 0.05 % Apply topically 2 (two) times daily. Apply bid to itchy rash, do not use on face, genitals, axillae Patient taking differently: Apply 1 application topically 2 (two) times daily as needed (Apply to itchy rash, do not use on face, genitals, axillae.).  02/24/13  Yes Guest, Benn Moulder, MD  clotrimazole (MYCELEX) 10 MG troche Take 1 tablet (10 mg total) by mouth 5 (five) times daily. 03/14/17  Yes [name], MD  cyclobenzaprine (FLEXERIL) 5 MG tablet 1 pill by mouth each bedtime as needed. 12/18/17  Yes [name], MD  esomeprazole (NEXIUM) 40 MG capsule TAKE 1 CAPSULE BY MOUTH DAILY 11/30/17  Yes [name], MD  fluconazole (DIFLUCAN) 100 MG tablet Take 1 tablet (100 mg total) by mouth daily. 03/12/17  Yes Collene Gobble, MD  fluticasone (FLONASE) 50 MCG/ACT nasal spray PLACE 2 SPRAYS INTO BOTH NOSTRILS DAILY AS NEEDED FOR ALLERGIES. 05/29/17  Yes [name], MD  HYDROcodone-acetaminophen (NORCO) 7.5-325 MG tablet Take 1-2 tablets by mouth every 6 (six) hours as needed for moderate pain. 02/09/16  Yes Dancy, Estill Bamberg, PA-C  KLS ALLERCLEAR 10 MG tablet TAKE 1 TABLET BY MOUTH DAILY 08/15/17  Yes Byrum, Rose Fillers, MD  [facility] ARTHRITIS PAIN RELIEF) PADS Apply 1 each topically  daily as needed (Pain).   Yes [provider]  methocarbamol (ROBAXIN) 500 MG tablet Take 1 tablet (500 mg total) by mouth every 6 (six) hours as needed for muscle spasms. 09/08/15  Yes Babish, Rodman Key, PA-C  metroNIDAZOLE (FLAGYL) 500 MG tablet 1 pill by mouth twice per day.  Avoid any alcohol while taking this medicine. 12/21/17  Yes [name], MD  Naphazoline-Pheniramine (OPCON-A) 0.027-0.315 % SOLN Apply 1 drop to eye 2 (two) times daily as needed (dry/irritated eyes).   Yes [provider]  omeprazole (PRILOSEC) 20 MG capsule Take 1 capsule (20 mg total) by mouth daily as needed. 02/21/17  Yes [name], MD  SPIRIVA RESPIMAT 2.5 MCG/ACT AERS INHALE TWO PUFFS BY MOUTH DAILY  10/29/17  Yes Collene Gobble, MD  Tiotropium Bromide Monohydrate (SPIRIVA RESPIMAT) 2.5 MCG/ACT AERS Inhale 2 puffs into the lungs daily.   Yes [provider]  traMADol (ULTRAM) 50 MG tablet Take 1 tablet (50 mg total) by mouth every 6 (six) hours as needed. 12/18/17  Yes [name], MD  triamcinolone cream (KENALOG) 0.1 % Apply 1 application topically 2 (two) times daily as needed. To affected areas only as needed. 08/07/16  Yes [name], MD  trolamine salicylate (ASPERCREME) 10 % cream Apply 1 application topically as needed for muscle pain.   Yes [provider]   Social History   Socioeconomic History  . Marital status: Married    Spouse name: Not on file  . Number of children: Not on file  . Years of education: Not on file  . Highest education level: Not on file  Occupational History  . Not on file  Social Needs  . Financial resource strain: Not on file  . Food insecurity:    Worry: Not on file    Inability: Not on file  . Transportation needs:    Medical: Not on file    Non-medical: Not on file  Tobacco Use  . Smoking status: Former Smoker    Packs/day: 1.50    Years: 20.00    Pack years: 30.00    Types: Cigarettes    Last attempt to quit:  11/05/1999    Years since quitting: 18.1  . Smokeless tobacco: Never Used  Substance and Sexual Activity  . Alcohol use: No    Comment: last used 25 years ago   . Drug use: Yes    Types: Cocaine    Comment: last used cocaine  and acid 25 yrs ago,none used since  . Sexual activity: Not on file  Lifestyle  . Physical activity:    Days per week: Not on file    Minutes per session: Not on file  . Stress: Not on file  Relationships  . Social connections:    Talks on phone: Not on file    Gets together: Not on file    Attends religious service: Not on file    Active member of club or organization: Not on file    Attends meetings of clubs or organizations: Not on file    Relationship status: Not on file  . Intimate partner violence:    Fear of current or ex partner: Not on file    Emotionally abused: Not on file    Physically abused: Not on file    Forced sexual activity: Not on file  Other Topics Concern  . Not on file  Social History Narrative  . Not on file   Review of Systems  Constitutional: Fever: subjective.  Gastrointestinal: Positive for abdominal pain, diarrhea, nausea and vomiting. Negative for blood in stool.  Genitourinary: Negative for difficulty urinating and enuresis.  Musculoskeletal: Positive for back pain.  Neurological: Positive for headaches.      Objective:   Physical Exam  Constitutional: He is oriented to person, place, and time. He appears well-developed and well-nourished. No distress.  HENT:  Head: Normocephalic and atraumatic.  Eyes: Conjunctivae and EOM are normal.  Neck: Neck supple. No tracheal deviation present.  Cardiovascular: Normal rate and regular rhythm.  Pulmonary/Chest: Effort normal and breath sounds normal. No respiratory distress.  Abdominal: Bowel sounds are increased. There is tenderness in the left upper quadrant and left lower quadrant. There is no CVA tenderness.  Slight tenderness in LUQ, primarily in LLQ.  Musculoskeletal:  Normal range of motion.  Minimal paraspinal tenderness on the L.  Neurological: He is alert and oriented to person, place, and time.  Skin: Skin is warm and dry.  Psychiatric: He has a normal mood and affect. His behavior is normal.  Nursing note and vitals  reviewed.  Vitals:   12/24/17 1149  BP: 130/80  Pulse: (!) 114  Temp: 98 F (36.7 C)  TempSrc: Oral  SpO2: 95%  Weight: 217 lb 3.2 oz (98.5 kg)  Height: 5\' 11"  (1.803 m)      Assessment & Plan:    [name] is a 67 y.o. male Diverticulitis - Plan: POCT CBC  Diarrhea, unspecified type - Plan: GI Profile, Stool, PCR  Non-intractable vomiting with nausea, unspecified vomiting type - Plan: ondansetron (ZOFRAN) 4 MG tablet  Overall diverticulitis appears to be improving.  Nausea/diarrhea likely related to use Cipro and Flagyl.  Zofran prescribed, but if persistent vomiting may need to change regimen.  Additionally will check GI profile with diarrhea, but less likely C. difficile colitis.  Recheck 3 days, sooner if worse, ER precautions.  Tramadol if needed for pain.  Meds ordered this encounter  Medications  . ondansetron (ZOFRAN) 4 MG tablet    Sig: Take 1 tablet (4 mg total) by mouth every 8 (eight) hours as needed for nausea or vomiting.    Dispense:  10 tablet    Refill:  0   Patient Instructions   Nausea and diarrhea may be related to the antibiotics. zofran if needed for nausea, but if vomiting persists, or continued diarrhea (more than 3-4 times per day), let me know so we can decide on change in meds. Continue tramadol if needed for pain.  I will likely refer you to gastroenterology at next visit to discuss colonoscopy and timing after this infection. Recheck in 3 days - Return to the clinic or go to the nearest emergency room if any of your symptoms worsen or new symptoms occur.   Diverticulitis Diverticulitis is infection or inflammation of small pouches (diverticula) in the colon that form due to a  condition called diverticulosis. Diverticula can trap stool (feces) and bacteria, causing infection and inflammation. Diverticulitis may cause severe stomach pain and diarrhea. It may lead to tissue damage in the colon that causes bleeding. The diverticula may also burst (rupture) and cause infected stool to enter other areas of the abdomen. Complications of diverticulitis can include:  Bleeding.  Severe infection.  Severe pain.  Rupture (perforation) of the colon.  Blockage (obstruction) of the colon.  What are the causes? This condition is caused by stool becoming trapped in the diverticula, which allows bacteria to grow in the diverticula. This leads to inflammation and infection. What increases the risk? You are more likely to develop this condition if:  You have diverticulosis. The risk for diverticulosis increases if: ? You are overweight or obese. ? You use tobacco products. ? You do not get enough exercise.  You eat a diet that does not include enough fiber. High-fiber foods include fruits, vegetables, beans, nuts, and whole grains.  What are the signs or symptoms? Symptoms of this condition may include:  Pain and tenderness in the abdomen. The pain is normally located on the left side of the abdomen, but it may occur in other areas.  Fever and chills.  Bloating.  Cramping.  Nausea.  Vomiting.  Changes in bowel routines.  Blood in your stool.  How is this diagnosed? This condition is diagnosed based on:  Your medical history.  A physical exam.  Tests to make sure there is nothing else causing your condition. These tests may include: ? Blood tests. ? Urine tests. ? Imaging tests of the abdomen, including X-rays, ultrasounds, MRIs, or CT scans.  How is this treated? Most  cases of this condition are mild and can be treated at home. Treatment may include:  Taking over-the-counter pain medicines.  Following a clear liquid diet.  Taking antibiotic  medicines by mouth.  Rest.  More severe cases may need to be treated at a hospital. Treatment may include:  Not eating or drinking.  Taking prescription pain medicine.  Receiving antibiotic medicines through an IV tube.  Receiving fluids and nutrition through an IV tube.  Surgery.  When your condition is under control, your health care provider may recommend that you have a colonoscopy. This is an exam to look at the entire large intestine. During the exam, a lubricated, bendable tube is inserted into the anus and then passed into the rectum, colon, and other parts of the large intestine. A colonoscopy can show how severe your diverticula are and whether something else may be causing your symptoms. Follow these instructions at home: Medicines  Take over-the-counter and prescription medicines only as told by your health care provider. These include fiber supplements, probiotics, and stool softeners.  If you were prescribed an antibiotic medicine, take it as told by your health care provider. Do not stop taking the antibiotic even if you start to feel better.  Do not drive or use heavy machinery while taking prescription pain medicine. General instructions  Follow a full liquid diet or another diet as directed by your health care provider. After your symptoms improve, your health care provider may tell you to change your diet. He or she may recommend that you eat a diet that contains at least 25 g (25 grams) of fiber daily. Fiber makes it easier to pass stool. Healthy sources of fiber include: ? Berries. One cup contains 4-8 grams of fiber. ? Beans or lentils. One half cup contains 5-8 grams of fiber. ? Green vegetables. One cup contains 4 grams of fiber.  Exercise for at least 30 minutes, 3 times each week. You should exercise hard enough to raise your heart rate and break a sweat.  Keep all follow-up visits as told by your health care provider. This is important. You may need a  colonoscopy. Contact a health care provider if:  Your pain does not improve.  You have a hard time drinking or eating food.  Your bowel movements do not return to normal. Get help right away if:  Your pain gets worse.  Your symptoms do not get better with treatment.  Your symptoms suddenly get worse.  You have a fever.  You vomit more than one time.  You have stools that are bloody, black, or tarry. Summary  Diverticulitis is infection or inflammation of small pouches (diverticula) in the colon that form due to a condition called diverticulosis. Diverticula can trap stool (feces) and bacteria, causing infection and inflammation.  You are at higher risk for this condition if you have diverticulosis and you eat a diet that does not include enough fiber.  Most cases of this condition are mild and can be treated at home. More severe cases may need to be treated at a hospital.  When your condition is under control, your health care provider may recommend that you have an exam called a colonoscopy. This exam can show how severe your diverticula are and whether something else may be causing your symptoms. This information is not intended to replace advice given to you by your health care provider. Make sure you discuss any questions you have with your health care provider. Document Released: 06/14/2005 Document Revised: 10/07/2016  Document Reviewed: 10/07/2016 Elsevier Interactive Patient Education  [employer].    IF you received an x-ray today, you will receive an invoice from [facility]. Please contact [facility] at [phone] with questions or concerns regarding your invoice.   IF you received labwork today, you will receive an invoice from [facility]. Please contact [facility] at [phone] with questions or concerns regarding your invoice.   Our billing staff will not be able to assist you with questions regarding bills from these companies.  You  will be contacted with the lab results as soon as they are available. The fastest way to get your results is to activate your My Chart account. Instructions are located on the last page of this paperwork. If you have not heard from [place] regarding the results in 2 weeks, please contact this office.       I personally performed the services described in this documentation, which was scribed in my presence. The recorded information has been reviewed and considered for accuracy and completeness, addended by me as needed, and agree with information above.  Signed,   [name], MD [facility] at [facility].  12/26/17 12:41 PM

## 2017-12-24 NOTE — Patient Instructions (Addendum)
Nausea and diarrhea may be related to the antibiotics. zofran if needed for nausea, but if vomiting persists, or continued diarrhea (more than 3-4 times per day), let me know so we can decide on change in meds. Continue tramadol if needed for pain.  I will likely refer you to gastroenterology at next visit to discuss colonoscopy and timing after this infection. Recheck in 3 days - Return to the clinic or go to the nearest emergency room if any of your symptoms worsen or new symptoms occur.   Diverticulitis Diverticulitis is infection or inflammation of small pouches (diverticula) in the colon that form due to a condition called diverticulosis. Diverticula can trap stool (feces) and bacteria, causing infection and inflammation. Diverticulitis may cause severe stomach pain and diarrhea. It may lead to tissue damage in the colon that causes bleeding. The diverticula may also burst (rupture) and cause infected stool to enter other areas of the abdomen. Complications of diverticulitis can include:  Bleeding.  Severe infection.  Severe pain.  Rupture (perforation) of the colon.  Blockage (obstruction) of the colon.  What are the causes? This condition is caused by stool becoming trapped in the diverticula, which allows bacteria to grow in the diverticula. This leads to inflammation and infection. What increases the risk? You are more likely to develop this condition if:  You have diverticulosis. The risk for diverticulosis increases if: ? You are overweight or obese. ? You use tobacco products. ? You do not get enough exercise.  You eat a diet that does not include enough fiber. High-fiber foods include fruits, vegetables, beans, nuts, and whole grains.  What are the signs or symptoms? Symptoms of this condition may include:  Pain and tenderness in the abdomen. The pain is normally located on the left side of the abdomen, but it may occur in other areas.  Fever and  chills.  Bloating.  Cramping.  Nausea.  Vomiting.  Changes in bowel routines.  Blood in your stool.  How is this diagnosed? This condition is diagnosed based on:  Your medical history.  A physical exam.  Tests to make sure there is nothing else causing your condition. These tests may include: ? Blood tests. ? Urine tests. ? Imaging tests of the abdomen, including X-rays, ultrasounds, MRIs, or CT scans.  How is this treated? Most cases of this condition are mild and can be treated at home. Treatment may include:  Taking over-the-counter pain medicines.  Following a clear liquid diet.  Taking antibiotic medicines by mouth.  Rest.  More severe cases may need to be treated at a hospital. Treatment may include:  Not eating or drinking.  Taking prescription pain medicine.  Receiving antibiotic medicines through an IV tube.  Receiving fluids and nutrition through an IV tube.  Surgery.  When your condition is under control, your health care provider may recommend that you have a colonoscopy. This is an exam to look at the entire large intestine. During the exam, a lubricated, bendable tube is inserted into the anus and then passed into the rectum, colon, and other parts of the large intestine. A colonoscopy can show how severe your diverticula are and whether something else may be causing your symptoms. Follow these instructions at home: Medicines  Take over-the-counter and prescription medicines only as told by your health care provider. These include fiber supplements, probiotics, and stool softeners.  If you were prescribed an antibiotic medicine, take it as told by your health care provider. Do not stop taking the  antibiotic even if you start to feel better.  Do not drive or use heavy machinery while taking prescription pain medicine. General instructions  Follow a full liquid diet or another diet as directed by your health care provider. After your symptoms  improve, your health care provider may tell you to change your diet. He or she may recommend that you eat a diet that contains at least 25 g (25 grams) of fiber daily. Fiber makes it easier to pass stool. Healthy sources of fiber include: ? Berries. One cup contains 4-8 grams of fiber. ? Beans or lentils. One half cup contains 5-8 grams of fiber. ? Green vegetables. One cup contains 4 grams of fiber.  Exercise for at least 30 minutes, 3 times each week. You should exercise hard enough to raise your heart rate and break a sweat.  Keep all follow-up visits as told by your health care provider. This is important. You may need a colonoscopy. Contact a health care provider if:  Your pain does not improve.  You have a hard time drinking or eating food.  Your bowel movements do not return to normal. Get help right away if:  Your pain gets worse.  Your symptoms do not get better with treatment.  Your symptoms suddenly get worse.  You have a fever.  You vomit more than one time.  You have stools that are bloody, black, or tarry. Summary  Diverticulitis is infection or inflammation of small pouches (diverticula) in the colon that form due to a condition called diverticulosis. Diverticula can trap stool (feces) and bacteria, causing infection and inflammation.  You are at higher risk for this condition if you have diverticulosis and you eat a diet that does not include enough fiber.  Most cases of this condition are mild and can be treated at home. More severe cases may need to be treated at a hospital.  When your condition is under control, your health care provider may recommend that you have an exam called a colonoscopy. This exam can show how severe your diverticula are and whether something else may be causing your symptoms. This information is not intended to replace advice given to you by your health care provider. Make sure you discuss any questions you have with your health care  provider. Document Released: 06/14/2005 Document Revised: 10/07/2016 Document Reviewed: 10/07/2016 [employer] Interactive Patient Education  2018 [employer].    IF you received an x-ray today, you will receive an invoice from [facility]. Please contact [facility] at [phone] with questions or concerns regarding your invoice.   IF you received labwork today, you will receive an invoice from [facility]. Please contact [facility] at [phone] with questions or concerns regarding your invoice.   Our billing staff will not be able to assist you with questions regarding bills from these companies.  You will be contacted with the lab results as soon as they are available. The fastest way to get your results is to activate your My Chart account. Instructions are located on the last page of this paperwork. If you have not heard from [place] regarding the results in 2 weeks, please contact this office.

## 2017-12-26 ENCOUNTER — Encounter: Payer: Self-pay | Admitting: Family Medicine

## 2017-12-26 LAB — GI PROFILE, STOOL, PCR
Adenovirus F 40/41: NOT DETECTED
Astrovirus: NOT DETECTED
C DIFFICILE TOXIN A/B: NOT DETECTED
CAMPYLOBACTER: NOT DETECTED
Cryptosporidium: NOT DETECTED
Cyclospora cayetanensis: NOT DETECTED
ENTEROTOXIGENIC E COLI: NOT DETECTED
Entamoeba histolytica: NOT DETECTED
Enteroaggregative E coli: NOT DETECTED
Enteropathogenic E coli: NOT DETECTED
GIARDIA LAMBLIA: NOT DETECTED
NOROVIRUS GI/GII: NOT DETECTED
PLESIOMONAS SHIGELLOIDES: NOT DETECTED
ROTAVIRUS A: NOT DETECTED
SHIGA-TOXIN-PRODUCING E COLI: NOT DETECTED
SHIGELLA/ENTEROINVASIVE E COLI: NOT DETECTED
Salmonella: NOT DETECTED
Sapovirus: NOT DETECTED
Vibrio cholerae: NOT DETECTED
Vibrio: NOT DETECTED
Yersinia enterocolitica: NOT DETECTED

## 2017-12-27 ENCOUNTER — Encounter: Payer: Self-pay | Admitting: Family Medicine

## 2017-12-27 ENCOUNTER — Ambulatory Visit: Payer: Managed Care, Other (non HMO) | Admitting: Family Medicine

## 2017-12-27 ENCOUNTER — Other Ambulatory Visit: Payer: Self-pay

## 2017-12-27 ENCOUNTER — Telehealth: Payer: Self-pay

## 2017-12-27 VITALS — BP 120/88 | HR 83 | Temp 98.4°F | Ht 71.0 in | Wt 217.6 lb

## 2017-12-27 DIAGNOSIS — K5732 Diverticulitis of large intestine without perforation or abscess without bleeding: Secondary | ICD-10-CM | POA: Diagnosis not present

## 2017-12-27 DIAGNOSIS — R195 Other fecal abnormalities: Secondary | ICD-10-CM | POA: Diagnosis not present

## 2017-12-27 DIAGNOSIS — R110 Nausea: Secondary | ICD-10-CM

## 2017-12-27 NOTE — Progress Notes (Signed)
Subjective:  By signing my name below, I, [name], attest that this documentation has been prepared under the direction and in the presence of [name], MD. Electronically Signed: [name], [place]. 12/27/2017 , 10:19 AM .  Patient was seen in Room 12 .   Patient ID: [name], male    DOB: [birth year], 67 y.o.   MRN: [id number] Chief Complaint  Patient presents with  . Diverticulitis    Follow up... loose stoole and nausa (maybe due to medication), overall getting better   HPI [name] is a 67 y.o. male Here for follow up of diverticulitis. See office visit 3 days ago. He was started on Cipro and Flagyl 6 days ago. He was having some nausea and diarrhea at last visit. GI profile was negative, and CBC was normal. Zofran was prescribed as needed for nausea, but significantly improving abdominal pain. Continue treatment.   Patient states he's still having some nausea with diarrhea. He hasn't taken Zofran. He's been having loose stools 2-3x in the morning and once at night. He's been drinking plenty of fluids and able to eat regular foods. His abdominal pain has improved. He did vomit once 2 days ago, but no other vomiting. He denies fever.   Last colonoscopy done about 9-10 years ago by Dr. Benson Norway.   Patient Active Problem List   Diagnosis Date Noted  . Allergic rhinitis 03/12/2017  . Thrush, oral 02/27/2017  . SUI (stress urinary incontinence), male 03/20/2016  . Prostate cancer ([place]) 02/09/2016  . Obese 09/08/2015  . S/P right TKA 09/06/2015  . S/P knee replacement 09/06/2015  . 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 Pulmonary nodules 02/23/2014  . Tobacco use disorder, moderate, in sustained remission 02/23/2014  . Recurrent right inguinal hernia 10/20/2013  . ED (erectile dysfunction) 08/26/2012  . Physical exam, annual 11/05/2011  . HTN (hypertension) 11/05/2011  . GERD (gastroesophageal reflux disease) 11/05/2011  . Chronic joint pain 11/05/2011    Past Medical History:  Diagnosis Date  . Allergy    seasonal  . Arthritis    oa  . Asthma   . COPD (chronic obstructive pulmonary disease) ([place])   . GERD (gastroesophageal reflux disease)   . Hypertension   . Prostate cancer ([place]) 01/2016   prostatectomy   . Shortness of breath dyspnea    Past Surgical History:  Procedure Laterality Date  . HERNIA REPAIR  last done 3-4 yrs ago   x 2  . INGUINAL HERNIA REPAIR Right 11/20/2013   Procedure: OPEN REPAIR OF RECURRENT RIGHT INGUINAL HERNIA WITH MESH;  Surgeon: [name]. [name], MD;  Location: [facility];  Service: General;  Laterality: Right;  . INSERTION OF MESH Right 11/20/2013   Procedure: INSERTION OF MESH;  Surgeon: [name]. [name], MD;  Location: [facility];  Service: General;  Laterality: Right;  . KNEE SURGERY Left july 2014   mcl and meniscus repair  . LYMPHADENECTOMY Bilateral 02/09/2016   Procedure: PELVIC LYMPHADENECTOMY;  Surgeon: [name], MD;  Location: [facility];  Service: Urology;  Laterality: Bilateral;  . right knee arthroscopy     . ROBOT ASSISTED LAPAROSCOPIC RADICAL PROSTATECTOMY N/A 02/09/2016   Procedure: XI ROBOTIC ASSISTED LAPAROSCOPIC RADICAL PROSTATECTOMY WITH INDOCYANINE GREEN DYE AND ADHESIOLYSIS;  Surgeon: [name], MD;  Location: [facility];  Service: Urology;  Laterality: N/A;  . TOTAL KNEE ARTHROPLASTY Right 09/06/2015   Procedure: RIGHT TOTAL KNEE ARTHROPLASTY;  Surgeon: [name], MD;  Location: [facility];  Service: Orthopedics;  Laterality:  Right;   Allergies  Allergen Reactions  . Percocet [Oxycodone-Acetaminophen] Other (See Comments)    Nausea-Doesn't work  . Amoxicillin Rash    Has patient had a PCN reaction causing immediate rash, facial/tongue/throat swelling, SOB or lightheadedness with hypotension: No Has patient had a PCN reaction causing severe rash involving mucus membranes or skin necrosis: No Has patient had a PCN reaction that required hospitalization No Has patient had a PCN reaction  occurring within the last 10 years: Yes- 2016 If all of the above answers are "NO", then may proceed with Cephalosporin use.    Prior to Admission medications   Medication Sig Start Date End Date Taking? Authorizing Provider  albuterol (PROVENTIL HFA;VENTOLIN HFA) 108 (90 Base) MCG/ACT inhaler Inhale 2 puffs into the lungs every 6 (six) hours as needed for wheezing or shortness of breath. 03/12/17   Byrum, Rose Fillers, MD  amLODipine (NORVASC) 5 MG tablet Take 1 tablet (5 mg total) by mouth daily. 02/19/17   [name], MD  ciprofloxacin (CIPRO) 500 MG tablet Take 1 tablet (500 mg total) by mouth 2 (two) times daily. 12/21/17   [name], MD  clobetasol ointment (TEMOVATE) 0.05 % Apply topically 2 (two) times daily. Apply bid to itchy rash, do not use on face, genitals, axillae Patient taking differently: Apply 1 application topically 2 (two) times daily as needed (Apply to itchy rash, do not use on face, genitals, axillae.).  02/24/13   Orma Flaming, MD  clotrimazole (MYCELEX) 10 MG troche Take 1 tablet (10 mg total) by mouth 5 (five) times daily. 03/14/17   [name], MD  cyclobenzaprine (FLEXERIL) 5 MG tablet 1 pill by mouth each bedtime as needed. 12/18/17   [name], MD  esomeprazole (NEXIUM) 40 MG capsule TAKE 1 CAPSULE BY MOUTH DAILY 11/30/17   [name], MD  fluconazole (DIFLUCAN) 100 MG tablet Take 1 tablet (100 mg total) by mouth daily. 03/12/17   Collene Gobble, MD  fluticasone (FLONASE) 50 MCG/ACT nasal spray PLACE 2 SPRAYS INTO BOTH NOSTRILS DAILY AS NEEDED FOR ALLERGIES. 05/29/17   [name], MD  HYDROcodone-acetaminophen (NORCO) 7.5-325 MG tablet Take 1-2 tablets by mouth every 6 (six) hours as needed for moderate pain. 02/09/16   Debbrah Alar, PA-C  KLS ALLERCLEAR 10 MG tablet TAKE 1 TABLET BY MOUTH DAILY 08/15/17   Collene Gobble, MD  Liniments [facility] ARTHRITIS PAIN RELIEF) PADS Apply 1 each topically daily as needed (Pain).    [provider]  methocarbamol (ROBAXIN) 500 MG tablet Take 1 tablet (500 mg total) by mouth every 6 (six) hours as needed for muscle spasms. 09/08/15   Danae Orleans, PA-C  metroNIDAZOLE (FLAGYL) 500 MG tablet 1 pill by mouth twice per day.  Avoid any alcohol while taking this medicine. 12/21/17   [name], MD  Naphazoline-Pheniramine (OPCON-A) 0.027-0.315 % SOLN Apply 1 drop to eye 2 (two) times daily as needed (dry/irritated eyes).    [provider]  omeprazole (PRILOSEC) 20 MG capsule Take 1 capsule (20 mg total) by mouth daily as needed. 02/21/17   [name], MD  ondansetron (ZOFRAN) 4 MG tablet Take 1 tablet (4 mg total) by mouth every 8 (eight) hours as needed for nausea or vomiting. 12/24/17   [name], MD  SPIRIVA RESPIMAT 2.5 MCG/ACT AERS INHALE TWO PUFFS BY MOUTH DAILY  10/29/17   Collene Gobble, MD  Tiotropium Bromide Monohydrate (SPIRIVA RESPIMAT) 2.5 MCG/ACT AERS Inhale  2 puffs into the lungs daily.    [provider]  traMADol (ULTRAM) 50 MG tablet Take 1 tablet (50 mg total) by mouth every 6 (six) hours as needed. 12/18/17   [name], MD  triamcinolone cream (KENALOG) 0.1 % Apply 1 application topically 2 (two) times daily as needed. To affected areas only as needed. 08/07/16   [name], MD  trolamine salicylate (ASPERCREME) 10 % cream Apply 1 application topically as needed for muscle pain.    [provider]   Social History   Socioeconomic History  . Marital status: Married    Spouse name: Not on file  . Number of children: Not on file  . Years of education: Not on file  . Highest education level: Not on file  Occupational History  . Not on file  Social Needs  . Financial resource strain: Not on file  . Food insecurity:    Worry: Not on file    Inability: Not on file  . Transportation needs:    Medical: Not on file    Non-medical: Not on file  Tobacco Use  . Smoking status: Former Smoker    Packs/day:  1.50    Years: 20.00    Pack years: 30.00    Types: Cigarettes    Last attempt to quit: 11/05/1999    Years since quitting: 18.1  . Smokeless tobacco: Never Used  Substance and Sexual Activity  . Alcohol use: No    Comment: last used 25 years ago   . Drug use: Yes    Types: Cocaine    Comment: last used cocaine  and acid 25 yrs ago,none used since  . Sexual activity: Not on file  Lifestyle  . Physical activity:    Days per week: Not on file    Minutes per session: Not on file  . Stress: Not on file  Relationships  . Social connections:    Talks on phone: Not on file    Gets together: Not on file    Attends religious service: Not on file    Active member of club or organization: Not on file    Attends meetings of clubs or organizations: Not on file    Relationship status: Not on file  . Intimate partner violence:    Fear of current or ex partner: Not on file    Emotionally abused: Not on file    Physically abused: Not on file    Forced sexual activity: Not on file  Other Topics Concern  . Not on file  Social History Narrative  . Not on file   Review of Systems  Constitutional: Negative for fatigue, fever and unexpected weight change.  Eyes: Negative for visual disturbance.  Respiratory: Negative for cough, chest tightness and shortness of breath.   Cardiovascular: Negative for chest pain, palpitations and leg swelling.  Gastrointestinal: Positive for diarrhea, nausea and vomiting (once, 2 days ago). Negative for abdominal pain (improved) and blood in stool.  Neurological: Negative for dizziness, light-headedness and headaches.       Objective:   Physical Exam  Constitutional: He is oriented to person, place, and time. He appears well-developed and well-nourished. No distress.  HENT:  Head: Normocephalic and atraumatic.  Eyes: Pupils are equal, round, and reactive to light. EOM are normal.  Neck: Neck supple.  Cardiovascular: Normal rate.  Pulmonary/Chest: Effort  normal. No respiratory distress.  Abdominal: He exhibits no distension. There is tenderness (very minimal discomfort, but not painful) in  the left lower quadrant.  Musculoskeletal: Normal range of motion.  Neurological: He is alert and oriented to person, place, and time.  Skin: Skin is warm and dry.  Psychiatric: He has a normal mood and affect. His behavior is normal.  Nursing note and vitals reviewed.   Vitals:   12/27/17 0956  BP: 120/88  Pulse: 83  Temp: 98.4 F (36.9 C)  TempSrc: Oral  SpO2: 95%  Weight: 217 lb 9.6 oz (98.7 kg)  Height: 5\' 11"  (1.803 m)       Assessment & Plan:    KHAYRI KARGBO is a 67 y.o. male Diverticulitis of colon - Plan: Ambulatory referral to Gastroenterology  Nausea without vomiting  Loose stools  Diverticulitis, improving.  Some nausea without vomiting, and has not had to use Zofran.  Loose stools 3 times per day, may be related to diverticulitis versus medication.  Bland diet discussed until those improve, continue hydration, continue full course of Cipro/Flagyl at this time and follow-up with gastroenterology.  RTC precautions if worsening symptoms.  Return to work in 5 days as long as he is improved.  Note provided  No orders of the defined types were placed in this encounter.  Patient Instructions    Currently your diverticulitis appears to be improving.  Continue antibiotics until those run out.  Bland foods until diarrhea improves, then can increase diet as tolerated.  If any return of pain or worsening symptoms after you have completed antibiotics, return right away as you may need a longer course.  I will refer you to gastroenterologist to determine timing of colonoscopy after this infection.  Let me know if you have questions.  Plan to return to work this upcoming Tuesday as long as you are improved.  Return to the clinic or go to the nearest emergency room if any of your symptoms worsen or new symptoms  occur.   Diverticulitis Diverticulitis is infection or inflammation of small pouches (diverticula) in the colon that form due to a condition called diverticulosis. Diverticula can trap stool (feces) and bacteria, causing infection and inflammation. Diverticulitis may cause severe stomach pain and diarrhea. It may lead to tissue damage in the colon that causes bleeding. The diverticula may also burst (rupture) and cause infected stool to enter other areas of the abdomen. Complications of diverticulitis can include:  Bleeding.  Severe infection.  Severe pain.  Rupture (perforation) of the colon.  Blockage (obstruction) of the colon.  What are the causes? This condition is caused by stool becoming trapped in the diverticula, which allows bacteria to grow in the diverticula. This leads to inflammation and infection. What increases the risk? You are more likely to develop this condition if:  You have diverticulosis. The risk for diverticulosis increases if: ? You are overweight or obese. ? You use tobacco products. ? You do not get enough exercise.  You eat a diet that does not include enough fiber. High-fiber foods include fruits, vegetables, beans, nuts, and whole grains.  What are the signs or symptoms? Symptoms of this condition may include:  Pain and tenderness in the abdomen. The pain is normally located on the left side of the abdomen, but it may occur in other areas.  Fever and chills.  Bloating.  Cramping.  Nausea.  Vomiting.  Changes in bowel routines.  Blood in your stool.  How is this diagnosed? This condition is diagnosed based on:  Your medical history.  A physical exam.  Tests to make sure there is nothing  else causing your condition. These tests may include: ? Blood tests. ? Urine tests. ? Imaging tests of the abdomen, including X-rays, ultrasounds, MRIs, or CT scans.  How is this treated? Most cases of this condition are mild and can be  treated at home. Treatment may include:  Taking over-the-counter pain medicines.  Following a clear liquid diet.  Taking antibiotic medicines by mouth.  Rest.  More severe cases may need to be treated at a hospital. Treatment may include:  Not eating or drinking.  Taking prescription pain medicine.  Receiving antibiotic medicines through an IV tube.  Receiving fluids and nutrition through an IV tube.  Surgery.  When your condition is under control, your health care provider may recommend that you have a colonoscopy. This is an exam to look at the entire large intestine. During the exam, a lubricated, bendable tube is inserted into the anus and then passed into the rectum, colon, and other parts of the large intestine. A colonoscopy can show how severe your diverticula are and whether something else may be causing your symptoms. Follow these instructions at home: Medicines  Take over-the-counter and prescription medicines only as told by your health care provider. These include fiber supplements, probiotics, and stool softeners.  If you were prescribed an antibiotic medicine, take it as told by your health care provider. Do not stop taking the antibiotic even if you start to feel better.  Do not drive or use heavy machinery while taking prescription pain medicine. General instructions  Follow a full liquid diet or another diet as directed by your health care provider. After your symptoms improve, your health care provider may tell you to change your diet. He or she may recommend that you eat a diet that contains at least 25 g (25 grams) of fiber daily. Fiber makes it easier to pass stool. Healthy sources of fiber include: ? Berries. One cup contains 4-8 grams of fiber. ? Beans or lentils. One half cup contains 5-8 grams of fiber. ? Green vegetables. One cup contains 4 grams of fiber.  Exercise for at least 30 minutes, 3 times each week. You should exercise hard enough to raise  your heart rate and break a sweat.  Keep all follow-up visits as told by your health care provider. This is important. You may need a colonoscopy. Contact a health care provider if:  Your pain does not improve.  You have a hard time drinking or eating food.  Your bowel movements do not return to normal. Get help right away if:  Your pain gets worse.  Your symptoms do not get better with treatment.  Your symptoms suddenly get worse.  You have a fever.  You vomit more than one time.  You have stools that are bloody, black, or tarry. Summary  Diverticulitis is infection or inflammation of small pouches (diverticula) in the colon that form due to a condition called diverticulosis. Diverticula can trap stool (feces) and bacteria, causing infection and inflammation.  You are at higher risk for this condition if you have diverticulosis and you eat a diet that does not include enough fiber.  Most cases of this condition are mild and can be treated at home. More severe cases may need to be treated at a hospital.  When your condition is under control, your health care provider may recommend that you have an exam called a colonoscopy. This exam can show how severe your diverticula are and whether something else may be causing your symptoms. This information  is not intended to replace advice given to you by your health care provider. Make sure you discuss any questions you have with your health care provider. Document Released: 06/14/2005 Document Revised: 10/07/2016 Document Reviewed: 10/07/2016 Elsevier Interactive Patient Education  2018 [employer].   IF you received an x-ray today, you will receive an invoice from [facility]. Please contact [facility] at [phone] with questions or concerns regarding your invoice.   IF you received labwork today, you will receive an invoice from [facility]. Please contact [facility] at [phone] with questions or concerns  regarding your invoice.   Our billing staff will not be able to assist you with questions regarding bills from these companies.  You will be contacted with the lab results as soon as they are available. The fastest way to get your results is to activate your My Chart account. Instructions are located on the last page of this paperwork. If you have not heard from [place] regarding the results in 2 weeks, please contact this office.       I personally performed the services described in this documentation, which was scribed in my presence. The recorded information has been reviewed and considered for accuracy and completeness, addended by me as needed, and agree with information above.  Signed,   [name], MD [facility].  12/27/17 10:27 AM

## 2017-12-27 NOTE — Telephone Encounter (Signed)
Advised front office that we will place in your box once it has been dropped off for you to fill out.   Copied from [place] [phone]. Topic: General - Other >> Dec 27, 2017 12:52 PM [name] wrote: Reason for CRM: pt cannot return to work until dr fills out fmla paperwork. Pt states he is on the way this afternoon to bring to the dr to fill out.  Pt was there this am, but his employer would not accept the RTW note he got today.

## 2017-12-27 NOTE — Patient Instructions (Addendum)
Currently your diverticulitis appears to be improving.  Continue antibiotics until those run out.  Bland foods until diarrhea improves, then can increase diet as tolerated.  If any return of pain or worsening symptoms after you have completed antibiotics, return right away as you may need a longer course.  I will refer you to gastroenterologist to determine timing of colonoscopy after this infection.  Let me know if you have questions.  Plan to return to work this upcoming Tuesday as long as you are improved.  Return to the clinic or go to the nearest emergency room if any of your symptoms worsen or new symptoms occur.   Diverticulitis Diverticulitis is infection or inflammation of small pouches (diverticula) in the colon that form due to a condition called diverticulosis. Diverticula can trap stool (feces) and bacteria, causing infection and inflammation. Diverticulitis may cause severe stomach pain and diarrhea. It may lead to tissue damage in the colon that causes bleeding. The diverticula may also burst (rupture) and cause infected stool to enter other areas of the abdomen. Complications of diverticulitis can include:  Bleeding.  Severe infection.  Severe pain.  Rupture (perforation) of the colon.  Blockage (obstruction) of the colon.  What are the causes? This condition is caused by stool becoming trapped in the diverticula, which allows bacteria to grow in the diverticula. This leads to inflammation and infection. What increases the risk? You are more likely to develop this condition if:  You have diverticulosis. The risk for diverticulosis increases if: ? You are overweight or obese. ? You use tobacco products. ? You do not get enough exercise.  You eat a diet that does not include enough fiber. High-fiber foods include fruits, vegetables, beans, nuts, and whole grains.  What are the signs or symptoms? Symptoms of this condition may include:  Pain and tenderness in the  abdomen. The pain is normally located on the left side of the abdomen, but it may occur in other areas.  Fever and chills.  Bloating.  Cramping.  Nausea.  Vomiting.  Changes in bowel routines.  Blood in your stool.  How is this diagnosed? This condition is diagnosed based on:  Your medical history.  A physical exam.  Tests to make sure there is nothing else causing your condition. These tests may include: ? Blood tests. ? Urine tests. ? Imaging tests of the abdomen, including X-rays, ultrasounds, MRIs, or CT scans.  How is this treated? Most cases of this condition are mild and can be treated at home. Treatment may include:  Taking over-the-counter pain medicines.  Following a clear liquid diet.  Taking antibiotic medicines by mouth.  Rest.  More severe cases may need to be treated at a hospital. Treatment may include:  Not eating or drinking.  Taking prescription pain medicine.  Receiving antibiotic medicines through an IV tube.  Receiving fluids and nutrition through an IV tube.  Surgery.  When your condition is under control, your health care provider may recommend that you have a colonoscopy. This is an exam to look at the entire large intestine. During the exam, a lubricated, bendable tube is inserted into the anus and then passed into the rectum, colon, and other parts of the large intestine. A colonoscopy can show how severe your diverticula are and whether something else may be causing your symptoms. Follow these instructions at home: Medicines  Take over-the-counter and prescription medicines only as told by your health care provider. These include fiber supplements, probiotics, and stool softeners.  If you were prescribed an antibiotic medicine, take it as told by your health care provider. Do not stop taking the antibiotic even if you start to feel better.  Do not drive or use heavy machinery while taking prescription pain medicine. General  instructions  Follow a full liquid diet or another diet as directed by your health care provider. After your symptoms improve, your health care provider may tell you to change your diet. He or she may recommend that you eat a diet that contains at least 25 g (25 grams) of fiber daily. Fiber makes it easier to pass stool. Healthy sources of fiber include: ? Berries. One cup contains 4-8 grams of fiber. ? Beans or lentils. One half cup contains 5-8 grams of fiber. ? Green vegetables. One cup contains 4 grams of fiber.  Exercise for at least 30 minutes, 3 times each week. You should exercise hard enough to raise your heart rate and break a sweat.  Keep all follow-up visits as told by your health care provider. This is important. You may need a colonoscopy. Contact a health care provider if:  Your pain does not improve.  You have a hard time drinking or eating food.  Your bowel movements do not return to normal. Get help right away if:  Your pain gets worse.  Your symptoms do not get better with treatment.  Your symptoms suddenly get worse.  You have a fever.  You vomit more than one time.  You have stools that are bloody, black, or tarry. Summary  Diverticulitis is infection or inflammation of small pouches (diverticula) in the colon that form due to a condition called diverticulosis. Diverticula can trap stool (feces) and bacteria, causing infection and inflammation.  You are at higher risk for this condition if you have diverticulosis and you eat a diet that does not include enough fiber.  Most cases of this condition are mild and can be treated at home. More severe cases may need to be treated at a hospital.  When your condition is under control, your health care provider may recommend that you have an exam called a colonoscopy. This exam can show how severe your diverticula are and whether something else may be causing your symptoms. This information is not intended to replace  advice given to you by your health care provider. Make sure you discuss any questions you have with your health care provider. Document Released: 06/14/2005 Document Revised: 10/07/2016 Document Reviewed: 10/07/2016 Elsevier Interactive Patient Education  2018 [employer].   IF you received an x-ray today, you will receive an invoice from [facility]. Please contact [facility] at [phone] with questions or concerns regarding your invoice.   IF you received labwork today, you will receive an invoice from [facility]. Please contact [facility] at [phone] with questions or concerns regarding your invoice.   Our billing staff will not be able to assist you with questions regarding bills from these companies.  You will be contacted with the lab results as soon as they are available. The fastest way to get your results is to activate your My Chart account. Instructions are located on the last page of this paperwork. If you have not heard from [place] regarding the results in 2 weeks, please contact this office.

## 2017-12-28 ENCOUNTER — Telehealth: Payer: Self-pay | Admitting: Family Medicine

## 2017-12-28 NOTE — Telephone Encounter (Signed)
Patient needs both FMLA and Disability forms completed for his employer for his most recent OV with Dr Carlota Raspberry. I have completed the forms based off the OV notes and highlighted the areas I was not sure about. I will place the forms in Dr Vonna Kotyk box on 12/29/17. Please return them to the FMLA/Disability box at the [address] checkout desk within 5-7 business days, thank you!

## 2018-01-01 ENCOUNTER — Telehealth: Payer: Self-pay

## 2018-01-01 NOTE — Telephone Encounter (Signed)
Original out of work note stated to return on 4/16 ok to write with no restrictions?  Copied from [place] [id number]. Topic: General - Other >> Jan 01, 2018 11:55 AM Carolyn Stare wrote:  Pt need a form typed up on letterhead saying it is ok for him to return to work on 01/02/18 without any restrictions. He need this letter today   [phone]

## 2018-01-01 NOTE — Telephone Encounter (Signed)
Pt is calling back checking on status of FMLA and Disability forms. Pt is aware may take up to 5-7 business day

## 2018-01-01 NOTE — Telephone Encounter (Signed)
OK for letter to return to work without restrictions. I am out of town, so can be signed by other provider if needed. I did provide letter last visit stating RTW today if that will suffice.  I am back in office Thursday and can work on [employer] ppwk then. Thanks.

## 2018-01-02 NOTE — Telephone Encounter (Signed)
Patient presents in clinic today for letter. He states he needs letter to state a return to work tomorrow with no restrictions, has not been able to return due to this. Letter printed with return to work for tomorrow with no restrictions.

## 2018-01-03 NOTE — Telephone Encounter (Signed)
FMLA ppwk and other form done - in [facility] box.

## 2018-01-07 ENCOUNTER — Telehealth: Payer: Self-pay | Admitting: Family Medicine

## 2018-01-07 NOTE — Telephone Encounter (Signed)
Relation to pt: self  Call back number: [phone]    Reason for call:  Patient confirming if FMLA paperwork was faxed to [employer] and [employer], patient states if not faxed to employer he can pick up forms, please advise

## 2018-01-07 NOTE — Telephone Encounter (Signed)
PATIENT CAME BY [address] TO DROP OFF A APPLICATION FOR RENEWAL OF DISABILITY PARKING PLACARD FORM FOR DR. Carlota Raspberry TO COMPLETE. HE WOULD LIKE TO BE CALLED WHEN THE FORM HAS BEEN DONE. I PUT THE FORM AT THE [address] IN [address]onna Kotyk [place]. BEST PHONE [phone] (CELL) [place]

## 2018-01-07 NOTE — Telephone Encounter (Signed)
See below

## 2018-01-07 NOTE — Telephone Encounter (Signed)
Spoke to patient concerning FMLA forms completed and Caitlyn is not here today. I will speak to her when she return and find out if the forms have been faxed to employer and [employer]. Also, patient has a Handicap form sent to him by [employer] to be completed by Dr Carlota Raspberry. He will drop it off at the office today, I advised him it will take at least 3 business days to be completed.

## 2018-01-08 NOTE — Telephone Encounter (Signed)
Paperwork scanned and faxed on 4/23

## 2018-01-10 DIAGNOSIS — Z0271 Encounter for disability determination: Secondary | ICD-10-CM

## 2018-01-10 NOTE — Telephone Encounter (Signed)
Pt advised form was completed. Wants to check status on his FMLA forms.

## 2018-01-10 NOTE — Telephone Encounter (Signed)
I have called pt and informed him that his disability Placard form is completed and ready for pick up.   Please update patient on the FMLA

## 2018-01-10 NOTE — Telephone Encounter (Signed)
Forms to be completed in providers box

## 2018-01-10 NOTE — Telephone Encounter (Signed)
Form completed.

## 2018-02-13 ENCOUNTER — Other Ambulatory Visit: Payer: Self-pay | Admitting: Family Medicine

## 2018-02-13 DIAGNOSIS — J301 Allergic rhinitis due to pollen: Secondary | ICD-10-CM

## 2018-02-14 ENCOUNTER — Other Ambulatory Visit: Payer: Self-pay | Admitting: Emergency Medicine

## 2018-02-14 ENCOUNTER — Other Ambulatory Visit: Payer: Self-pay | Admitting: Family Medicine

## 2018-02-14 DIAGNOSIS — J301 Allergic rhinitis due to pollen: Secondary | ICD-10-CM

## 2018-02-20 ENCOUNTER — Other Ambulatory Visit: Payer: Self-pay | Admitting: Family Medicine

## 2018-02-20 ENCOUNTER — Telehealth: Payer: Self-pay | Admitting: Emergency Medicine

## 2018-02-20 DIAGNOSIS — J301 Allergic rhinitis due to pollen: Secondary | ICD-10-CM

## 2018-02-20 NOTE — Telephone Encounter (Signed)
Please see message below and advise. Is it okay to fill Brand? Or is there another recommendation on Flonase.

## 2018-02-20 NOTE — Telephone Encounter (Signed)
Patient would like a prescription for [facility].  He does not want generic.  It is not working.

## 2018-02-20 NOTE — Telephone Encounter (Signed)
Received a Fax for a PA for Spiriva respimat  2.5 mcg. I have called the insurance with pharmacy benefits [phone]. They will be faxing a form for [place] to fill out for the PA. Will follow up on this.

## 2018-02-21 NOTE — Telephone Encounter (Signed)
I do not expect the brand name Flonase to work any differently than the generic.  I did send a prescription in, but would recommend he try 2 sprays in each nostril each day to make sure he is getting adequate treatment.  If he is still not having control with that dosing, would recommend follow-up to discuss other treatment options.

## 2018-02-26 NOTE — Telephone Encounter (Signed)
Attempted to initiate PA via [email or web address], but pt's insurance info is not up to date. Pt has not been seen since 03/12/17, needs office visit for additional refills.  lmtcb for pt.

## 2018-02-26 NOTE — Telephone Encounter (Signed)
Pt is calling about the Spiriva. Pt needs a refill [facility]   Pt Number [phone]

## 2018-02-27 MED ORDER — TIOTROPIUM BROMIDE MONOHYDRATE 2.5 MCG/ACT IN AERS: 2.0000 | INHALATION_SPRAY | Freq: Every day | RESPIRATORY_TRACT | 0 refills | Status: DC

## 2018-02-27 NOTE — Telephone Encounter (Signed)
LMTCB for the pt 

## 2018-02-27 NOTE — Telephone Encounter (Signed)
PA has been initiated through Cover My Meds. Key: [id number]. PA has been sent to the pt's insurance plan. Will await PA decision.

## 2018-02-27 NOTE — Telephone Encounter (Signed)
Spoke with pt  I advised will call and try to get PA  He states pharmacy has his most updated ins card  I advised he needs ov since he is overdue and scheduled him appt with RB for 04/29/18 (first available pt could come) I left 2 samples up front since he has been out of med x 2 wks  Ria Comment working on PA through [facility]- will forward to her to document

## 2018-02-27 NOTE — Telephone Encounter (Signed)
Pt is calling back [phone] 

## 2018-02-27 NOTE — Telephone Encounter (Signed)
Pt said that [facility] needs a prior authorization for Spiriva.   Pt Number [phone]

## 2018-03-01 NOTE — Telephone Encounter (Signed)
Checked CCM and msg states outcome was "NA"  Called plan at [phone]  Spoke with rep  She stated that the pt must try and fail anoro, incruse, or stiolto first  RB- please advise, thanks

## 2018-03-07 MED ORDER — UMECLIDINIUM BROMIDE 62.5 MCG/INH IN AEPB: 1.0000 | INHALATION_SPRAY | Freq: Every day | RESPIRATORY_TRACT | 5 refills | Status: DC

## 2018-03-07 NOTE — Telephone Encounter (Signed)
Incruse would be an acceptable alternative to Spiriva.  If he has never tried Incruse for failed it, then okay to order this as a substitute for Spiriva

## 2018-03-07 NOTE — Telephone Encounter (Signed)
Rx has been sent in per Dr. [name]. Nothing further is needed.

## 2018-03-14 ENCOUNTER — Other Ambulatory Visit: Payer: Self-pay | Admitting: Family Medicine

## 2018-03-14 DIAGNOSIS — I10 Essential (primary) hypertension: Secondary | ICD-10-CM

## 2018-03-16 ENCOUNTER — Telehealth: Payer: Self-pay | Admitting: Family Medicine

## 2018-03-16 DIAGNOSIS — I10 Essential (primary) hypertension: Secondary | ICD-10-CM

## 2018-03-22 NOTE — Telephone Encounter (Signed)
Pt is saying pharmacy only gave him enough for 15 days. (2 - 20mg ) a day. So he is running out sooner.   Ins. Only pay for over the counter but still needs prescription

## 2018-03-23 NOTE — Telephone Encounter (Signed)
LMOVM for pt to clarify which medication he is talking about that he is taking 2 (20mg ) tabs per day. Asked him to return call to the office and let us know.

## 2018-03-25 NOTE — Telephone Encounter (Signed)
Spoke to patient concerning message, he states his insurance will not pay for omeprazole by prescription. The insurance will pay for the over the counter omeprazole, which has only 14 tablets in the box. The patient states it will not last taking 2 tablets a day, he will run out. Please advise, thank you.

## 2018-03-26 ENCOUNTER — Other Ambulatory Visit: Payer: Self-pay | Admitting: Emergency Medicine

## 2018-03-26 ENCOUNTER — Other Ambulatory Visit: Payer: Self-pay | Admitting: Family Medicine

## 2018-03-26 DIAGNOSIS — I10 Essential (primary) hypertension: Secondary | ICD-10-CM

## 2018-03-27 MED ORDER — AMLODIPINE BESYLATE 5 MG PO TABS: 5.0000 mg | ORAL_TABLET | Freq: Every day | ORAL | 1 refills | Status: DC

## 2018-03-27 MED ORDER — OMEPRAZOLE 20 MG PO CPDR: 20.0000 mg | DELAYED_RELEASE_CAPSULE | Freq: Every day | ORAL | 0 refills | Status: DC | PRN

## 2018-03-27 NOTE — Addendum Note (Signed)
Addended by: [name] R on: 03/27/2018 03:37 PM   Modules accepted: Orders

## 2018-03-27 NOTE — Telephone Encounter (Signed)
Refill request for amlodipine 5 mg tab, prescription expired on 02/19/18 And omeprazole 20 mg cap, prescription expired on 02/21/18  LOV  12/27/17 Dr. Claudette [facility]

## 2018-03-27 NOTE — Telephone Encounter (Signed)
I don't see that we have discussed his heartburn recently. I will refill the omeprazole and amlodipine for now, but please schedule appointment within 1 month to review these meds and for fasting labwork.

## 2018-03-28 ENCOUNTER — Other Ambulatory Visit: Payer: Self-pay | Admitting: Family Medicine

## 2018-04-07 ENCOUNTER — Telehealth: Payer: Self-pay | Admitting: Family Medicine

## 2018-04-07 NOTE — Telephone Encounter (Signed)
Call from answering service. Initially with sx's of thrush as has been using inhaler more often. Has had some increased dyspnea, and some flair of his back pain. But also noted to nurse he has had headache and numbness in left fingers over past week. May be similar to prior sx's.   Advised to be seen in ER or urgent care today for any increased dyspnea or needing albuterol more frequently than 4 hours otherwise be seen tomorrow in office if not requiring as frequent.   Also advised ER eval if new headache or numbness in fingers. If chronic issue, can discuss in office. Can discuss low back pain in office as well as similar sx's in past, unless worsening pain - be seen in ER.

## 2018-04-15 ENCOUNTER — Ambulatory Visit: Payer: Managed Care, Other (non HMO) | Admitting: Family Medicine

## 2018-04-22 ENCOUNTER — Other Ambulatory Visit: Payer: Self-pay

## 2018-04-22 ENCOUNTER — Encounter: Payer: Self-pay | Admitting: Family Medicine

## 2018-04-22 ENCOUNTER — Ambulatory Visit: Payer: Managed Care, Other (non HMO) | Admitting: Family Medicine

## 2018-04-22 VITALS — BP 138/84 | HR 68 | Temp 99.2°F | Ht 71.0 in | Wt 219.2 lb

## 2018-04-22 DIAGNOSIS — R059 Cough, unspecified: Secondary | ICD-10-CM

## 2018-04-22 DIAGNOSIS — R05 Cough: Secondary | ICD-10-CM

## 2018-04-22 DIAGNOSIS — H1010 Acute atopic conjunctivitis, unspecified eye: Secondary | ICD-10-CM

## 2018-04-22 DIAGNOSIS — J3489 Other specified disorders of nose and nasal sinuses: Secondary | ICD-10-CM

## 2018-04-22 DIAGNOSIS — B370 Candidal stomatitis: Secondary | ICD-10-CM | POA: Diagnosis not present

## 2018-04-22 DIAGNOSIS — J309 Allergic rhinitis, unspecified: Secondary | ICD-10-CM

## 2018-04-22 DIAGNOSIS — M545 Low back pain: Secondary | ICD-10-CM

## 2018-04-22 DIAGNOSIS — J449 Chronic obstructive pulmonary disease, unspecified: Secondary | ICD-10-CM

## 2018-04-22 DIAGNOSIS — R937 Abnormal findings on diagnostic imaging of other parts of musculoskeletal system: Secondary | ICD-10-CM

## 2018-04-22 LAB — POCT CBC
Granulocyte percent: 53.4 %G (ref 37–80)
HCT, POC: 50.1 % (ref 43.5–53.7)
Hemoglobin: 16 g/dL (ref 14.1–18.1)
Lymph, poc: 2.1 (ref 0.6–3.4)
MCH, POC: 28.9 pg (ref 27–31.2)
MCHC: 31.9 g/dL (ref 31.8–35.4)
MCV: 90.6 fL (ref 80–97)
MID (CBC): 0.4 (ref 0–0.9)
MPV: 8.8 fL (ref 0–99.8)
PLATELET COUNT, POC: 288 10*3/uL (ref 142–424)
POC Granulocyte: 2.9 (ref 2–6.9)
POC LYMPH %: 39.8 % (ref 10–50)
POC MID %: 6.8 % (ref 0–12)
RBC: 5.53 M/uL (ref 4.69–6.13)
RDW, POC: 13.8 %
WBC: 5.4 10*3/uL (ref 4.6–10.2)

## 2018-04-22 LAB — POCT SKIN KOH: Skin KOH, POC: NEGATIVE

## 2018-04-22 MED ORDER — CYCLOBENZAPRINE HCL 5 MG PO TABS: 5.0000 mg | ORAL_TABLET | Freq: Three times a day (TID) | ORAL | 1 refills | Status: DC | PRN

## 2018-04-22 MED ORDER — AZELASTINE HCL 0.05 % OP SOLN: 1.0000 [drp] | Freq: Two times a day (BID) | OPHTHALMIC | 1 refills | Status: DC | PRN

## 2018-04-22 NOTE — Patient Instructions (Addendum)
Use flonase every day for congestion.  Claritin once per day. Optivar eye drops for itchy eyes if needed as those may help if allergy cause.  Return if congestion is not improving in the next week to 10 days or any worsening symptoms such as fever or increased face pain,  worsening cough, worsening fatigue, or needing the albuterol more frequently.   You should start back on Spiriva daily, just make sure to rinse mouth after use. Discuss copd and meds with Dr. [name] as well.   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  Okay to use the Flexeril which is a different muscle relaxant up to every 8 hours/day but start with that at bedtime because it does make you sleepy. It is okay to use Tylenol with that medication for now for your back pain. If you have any worsening back pain, loss of feeling in your legs, weakness in the legs, loss of control of urine or stool,  or other worsening symptoms return here to the emergency room  If worsening mouth pain, or any persistent discoloration of tongue - please return for recheck to decide next step.   Please follow-up with me in 1 week to discuss any concerns we did not have a chance to discuss today.  If you have any worsening symptoms prior to that time please return here or the emergency room   Nasal Allergies Nasal allergies are a reaction to allergens in the air. Allergens are particles in the air that cause your body to have an allergic reaction. Nasal allergies are not passed from person to person (are not contagious). They cannot be cured, but they can be controlled. What are the causes? Seasonal nasal allergies (hay fever) are caused by pollen allergens that come from grasses, trees, and weeds. Year-round nasal allergies (perennial allergic rhinitis) are caused by allergens such as house dust mites, pet dander, and mold spores. What increases the  risk? The following factors may make you more likely to develop this condition:  Having certain health conditions. These include: ? Other types of allergies, such as food allergies. ? Asthma. ? Eczema.  Having a close relative who has allergies or asthma.  Exposure to house dust, pollen, dander, or other allergens at home or at work.  Exposure to air pollution or secondhand smoke when you were a child.  What are the signs or symptoms? Symptoms of this condition include:  Sneezing.  Runny nose or stuffy nose (congestion).  Watery (tearing) eyes.  Itchy eyes, nose, mouth, throat, skin, or other area.  Sore throat.  Headache.  Decreased sense of smell or taste.  Fatigue. This may occur if you have trouble sleeping due to allergies.  Swollen eyelids.  How is this diagnosed? This condition is diagnosed with a medical history and physical exam. Allergy testing may be done to determine exactly what triggers your nasal allergies. How is this treated? There is no cure for nasal allergies. Treatment focuses on controlling your symptoms, and it may include:  Medicines that block allergy symptoms. These may include allergy shots, nasal sprays, and oral antihistamines.  Avoiding the allergen.  Follow these instructions at home:  Avoid the allergen that is causing your symptoms, if possible.  Keep windows closed. If possible, use air conditioning when pollen counts are high.  Do not use fans in your home.  Do not hang  clothes outside to dry.  Wear sunglasses to keep pollen out of your eyes.  Wash your hands right away after you touch household pets.  Take over-the-counter and prescription medicines only as told by your health care provider.  Keep all follow-up visits as told by your health care provider. This is important. Contact a health care provider if:  You have a fever.  You develop a cough that does not go away (is persistent).  You start to wheeze.  Your  symptoms do not improve with treatment.  You have thick nasal discharge.  You start to have nosebleeds. Get help right away if:  Your tongue or your lips are swollen.  You have trouble breathing.  You feel light-headed or you feel like you are going to faint.  You have cold sweats. This information is not intended to replace advice given to you by your health care provider. Make sure you discuss any questions you have with your health care provider. Document Released: 09/04/2005 Document Revised: 02/07/2016 Document Reviewed: 03/17/2015 Elsevier Interactive Patient Education  2018 [employer].   IF you received an x-ray today, you will receive an invoice from [facility]. Please contact [facility] at [phone] with questions or concerns regarding your invoice.   IF you received labwork today, you will receive an invoice from [facility]. Please contact [facility] at [phone] with questions or concerns regarding your invoice.   Our billing staff will not be able to assist you with questions regarding bills from these companies.  You will be contacted with the lab results as soon as they are available. The fastest way to get your results is to activate your My Chart account. Instructions are located on the last page of this paperwork. If you have not heard from [place] regarding the results in 2 weeks, please contact this office.

## 2018-04-22 NOTE — Progress Notes (Signed)
Subjective:    Patient ID: [name], male    DOB: 12/11/1950, 67 y.o.   MRN: [id number]  HPI [name] is a 67 y.o. male Presents today for: Chief Complaint  Patient presents with  . Left eye and nose running    Left side of top of mouth hurts  (thursh come everytime he uses inhealers, but he feels like it has resolved for the most part)  . Cough  . Back Pain   Here for multiple concerns today.    Thrush/COPD.  He does use inhalers for COPD.  Spiriva, albuterol for breathing treatments. See phone note form 7/21. Was rinsing mouth after using inhaler. 2nd episode in past 6 months.  Stopped Spiriva 3 weeks ago as thought it was causing thrush, and used albuterol few days ago.  Breathing is doing ok. Felt like had thrush with soreness on tongue, coating around 7/21.  Tried starting back on clotrimazole lozenges 5 times per day - used for 1 week and took care of problem. No soreness, coating on tongue improved. No unexplained weight loss, night sweats, fevers.   Runny nose, congestion: 2 weeks. No measured fevers. Has had some cough - past week. Nonproductive. Wheezing also, last used albuterol few days ago.no attempted treatments. Uses flonase nasal spray daily - later in visit - states he takes it every few days.  No other allergy meds. Itchy eye at times. No known sick contacts. Cough comes and goes. Soreness in roof of mouth - more of a scratchy sensation in top of mouth.   Back pain: Chronic issue, comes and goes over past few years. Robaxin helped in past, but less helpful recently.  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 No other treatments. Other treatment: Alleve about once a week. Min relief.  No bowel or bladder incontinence, no saddle anesthesia, no lower extremity weakness. No unexplained weight loss.  Occasional sweats at night. No fever. Left low back pain when seen in April - CT indicated diverticulitis. No PT, no injections for back. 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 Back pain into buttocks both side at times.   He was seen in April for diverticulitis.  Did have CT abdomen pelvis at that time.  Notation was made regarding left hemipelvis and possible Paget's disease, but reported stable findings.  No prior hip surgery.  Had been told he had degenerative changes.  Chronic pain in left hip, but feels like may be worse. Ortho for knees - Dr. Alvan Dame. ([facility]).  Has not see ortho for hip issues.    Patient Active Problem List   Diagnosis Date Noted  . Allergic rhinitis 03/12/2017  . Thrush, oral 02/27/2017  . SUI (stress urinary incontinence), male 03/20/2016  . Prostate cancer ([place]) 02/09/2016  . Obese 09/08/2015  . S/P right TKA 09/06/2015  . S/P knee replacement 09/06/2015  . COPD, mild ([place]) 07/20/2014  . Pulmonary nodules 02/23/2014  . Tobacco use disorder, moderate, in sustained remission 02/23/2014  . Recurrent right inguinal hernia 10/20/2013  . ED (erectile dysfunction) 08/26/2012  . Physical exam, annual 11/05/2011  . HTN (hypertension) 11/05/2011  . GERD (gastroesophageal reflux disease) 11/05/2011  . Chronic joint pain 11/05/2011   Past Medical History:  Diagnosis Date  . Allergy    seasonal  . Arthritis    oa  . Asthma   . COPD (chronic obstructive pulmonary disease) ([place])   . GERD (gastroesophageal reflux disease)   .  Hypertension   . Prostate cancer ([place]) 01/2016   prostatectomy   . Shortness of breath dyspnea    Past Surgical History:  Procedure Laterality Date  . HERNIA REPAIR  last done 3-4 yrs ago   x 2  . INGUINAL HERNIA REPAIR Right 11/20/2013   Procedure: OPEN REPAIR OF RECURRENT RIGHT INGUINAL HERNIA WITH MESH;  Surgeon: [name]. [name], MD;  [facility];  Service: General;  Laterality: Right;  . INSERTION OF MESH Right 11/20/2013   Procedure: INSERTION OF MESH;  Surgeon: [name]. [name], MD;  [facility];  Service: General;  Laterality: Right;  . KNEE SURGERY Left july 2014    mcl and meniscus repair  . LYMPHADENECTOMY Bilateral 02/09/2016   Procedure: PELVIC LYMPHADENECTOMY;  Surgeon: [name], MD;  [facility];  Service: Urology;  Laterality: Bilateral;  . right knee arthroscopy     . ROBOT ASSISTED LAPAROSCOPIC RADICAL PROSTATECTOMY N/A 02/09/2016   Procedure: XI ROBOTIC ASSISTED LAPAROSCOPIC RADICAL PROSTATECTOMY WITH INDOCYANINE GREEN DYE AND ADHESIOLYSIS;  Surgeon: [name], MD;  [facility];  Service: Urology;  Laterality: N/A;  . TOTAL KNEE ARTHROPLASTY Right 09/06/2015   Procedure: RIGHT TOTAL KNEE ARTHROPLASTY;  Surgeon: [name], MD;  [facility];  Service: Orthopedics;  Laterality: Right;   Allergies  Allergen Reactions  . Percocet [Oxycodone-Acetaminophen] Other (See Comments)    Nausea-Doesn't work  . Amoxicillin Rash    Has patient had a PCN reaction causing immediate rash, facial/tongue/throat swelling, SOB or lightheadedness with hypotension: No Has patient had a PCN reaction causing severe rash involving mucus membranes or skin necrosis: No Has patient had a PCN reaction that required hospitalization No Has patient had a PCN reaction occurring within the last 10 years: Yes- 2016 If all of the above answers are "NO", then may proceed with Cephalosporin use.    Prior to Admission medications   Medication Sig Start Date End Date Taking? Authorizing Provider  amLODipine (NORVASC) 5 MG tablet Take 1 tablet (5 mg total) by mouth daily. 03/27/18  Yes [name], MD  clobetasol ointment (TEMOVATE) 0.05 % Apply topically 2 (two) times daily. Apply bid to itchy rash, do not use on face, genitals, axillae Patient taking differently: Apply 1 application topically 2 (two) times daily as needed (Apply to itchy rash, do not use on face, genitals, axillae.).  02/24/13  Yes Guest, Benn Moulder, MD  cyclobenzaprine (FLEXERIL) 5 MG tablet 1 pill by mouth each bedtime as needed. 12/18/17  Yes [name], MD  esomeprazole (NEXIUM)  40 MG capsule TAKE 1 CAPSULE BY MOUTH DAILY 11/30/17  Yes [name], MD  fluconazole (DIFLUCAN) 100 MG tablet Take 1 tablet (100 mg total) by mouth daily. 03/12/17  Yes Collene Gobble, MD  fluticasone (FLONASE) 50 MCG/ACT nasal spray PLACE 2 SPRAYS INTO BOTH NOSTRILS DAILY AS NEEDED FOR ALLERGIES. 02/21/18  Yes [name], MD  KLS ALLERCLEAR 10 MG tablet TAKE 1 TABLET BY MOUTH DAILY 08/15/17  Yes Collene Gobble, MD  KLS ESOMEPRAZOLE MAGNESIUM 20 MG capsule TAKE TWO CAPSULES BY MOUTH DAILY  03/28/18  Yes [name], MD  metroNIDAZOLE (FLAGYL) 500 MG tablet 1 pill by mouth twice per day.  Avoid any alcohol while taking this medicine. 12/21/17  Yes [name], MD  Naphazoline-Pheniramine (OPCON-A) 0.027-0.315 % SOLN Apply 1 drop to eye 2 (two) times daily as needed (dry/irritated eyes).   Yes [provider]  omeprazole (PRILOSEC) 20 MG capsule  Take 1-2 capsules (20-40 mg total) by mouth daily as needed. 03/27/18  Yes [name], MD  traMADol (ULTRAM) 50 MG tablet Take 1 tablet (50 mg total) by mouth every 6 (six) hours as needed. 12/18/17  Yes [name], MD  triamcinolone cream (KENALOG) 0.1 % Apply 1 application topically 2 (two) times daily as needed. To affected areas only as needed. 08/07/16  Yes [name], MD  albuterol (PROVENTIL HFA;VENTOLIN HFA) 108 (90 Base) MCG/ACT inhaler Inhale 2 puffs into the lungs every 6 (six) hours as needed for wheezing or shortness of breath. Patient not taking: Reported on 04/22/2018 03/12/17   Collene Gobble, MD  methocarbamol (ROBAXIN) 500 MG tablet Take 1 tablet (500 mg total) by mouth every 6 (six) hours as needed for muscle spasms. Patient not taking: Reported on 04/22/2018 09/08/15   Danae Orleans, PA-C  ondansetron (ZOFRAN) 4 MG tablet Take 1 tablet (4 mg total) by mouth every 8 (eight) hours as needed for nausea or vomiting. Patient not taking: Reported on 04/22/2018 12/24/17   [name], MD  SPIRIVA  RESPIMAT 2.5 MCG/ACT AERS INHALE TWO PUFFS BY MOUTH DAILY  Patient not taking: Reported on 04/22/2018 10/29/17   Collene Gobble, MD   Social History   Socioeconomic History  . Marital status: Married    Spouse name: Not on file  . Number of children: Not on file  . Years of education: Not on file  . Highest education level: Not on file  Occupational History  . Not on file  Social Needs  . Financial resource strain: Not on file  . Food insecurity:    Worry: Not on file    Inability: Not on file  . Transportation needs:    Medical: Not on file    Non-medical: Not on file  Tobacco Use  . Smoking status: Former Smoker    Packs/day: 1.50    Years: 20.00    Pack years: 30.00    Types: Cigarettes    Last attempt to quit: 11/05/1999    Years since quitting: 18.4  . Smokeless tobacco: Never Used  Substance and Sexual Activity  . Alcohol use: No    Comment: last used 25 years ago   . Drug use: Yes    Types: Cocaine    Comment: last used cocaine  and acid 25 yrs ago,none used since  . Sexual activity: Not on file  Lifestyle  . Physical activity:    Days per week: Not on file    Minutes per session: Not on file  . Stress: Not on file  Relationships  . Social connections:    Talks on phone: Not on file    Gets together: Not on file    Attends religious service: Not on file    Active member of club or organization: Not on file    Attends meetings of clubs or organizations: Not on file    Relationship status: Not on file  . Intimate partner violence:    Fear of current or ex partner: Not on file    Emotionally abused: Not on file    Physically abused: Not on file    Forced sexual activity: Not on file  Other Topics Concern  . Not on file  Social History Narrative  . Not on file    Review of Systems Per hpi.     Objective:   Physical Exam  Constitutional: He is oriented to person, place, and time. He appears  well-developed and well-nourished.  HENT:  Head:  Normocephalic and atraumatic.  Right Ear: Tympanic membrane, external ear and ear canal normal.  Left Ear: Tympanic membrane, external ear and ear canal normal.  Nose: No rhinorrhea.  Mouth/Throat: Mucous membranes are normal. No oropharyngeal exudate or posterior oropharyngeal erythema.  Min white coating on tongue. Buccal mucosa and throat clear.     Eyes: Pupils are equal, round, and reactive to light. Conjunctivae are normal.  Neck: Neck supple.  Cardiovascular: Normal rate, regular rhythm, normal heart sounds and intact distal pulses.  No murmur heard. Pulmonary/Chest: Effort normal and breath sounds normal. He has no wheezes. He has no rhonchi. He has no rales.  Abdominal: Soft. There is no tenderness.  Musculoskeletal:  Negative seated straight leg raise.  Difficulty obtaining reflexes but equal bilaterally lower extremities.  Lymphadenopathy:    He has no cervical adenopathy.  Neurological: He is alert and oriented to person, place, and time.  Skin: Skin is warm and dry. No rash noted.  Psychiatric: He has a normal mood and affect. His behavior is normal.  Vitals reviewed.  Vitals:   04/22/18 1021  BP: 138/84  Pulse: 68  Temp: 99.2 F (37.3 C)  TempSrc: Oral  SpO2: 92%  Weight: 219 lb 3.2 oz (99.4 kg)  Height: 5\' 11"  (1.803 m)   Results for orders placed or performed in visit on 04/22/18  POCT CBC  Result Value Ref Range   WBC 5.4 4.6 - 10.2 K/uL   Lymph, poc 2.1 0.6 - 3.4   POC LYMPH PERCENT 39.8 10 - 50 %L   MID (cbc) 0.4 0 - 0.9   POC MID % 6.8 0 - 12 %M   POC Granulocyte 2.9 2 - 6.9   Granulocyte percent 53.4 37 - 80 %G   RBC 5.53 4.69 - 6.13 M/uL   Hemoglobin 16.0 14.1 - 18.1 g/dL   HCT, POC 50.1 43.5 - 53.7 %   MCV 90.6 80 - 97 fL   MCH, POC 28.9 27 - 31.2 pg   MCHC 31.9 31.8 - 35.4 g/dL   RDW, POC 13.8 %   Platelet Count, POC 288 142 - 424 K/uL   MPV 8.8 0 - 99.8 fL  POCT Skin KOH  Result Value Ref Range   Skin KOH, POC Negative Negative        Assessment & Plan:    FAHEEM ZIEMANN is a 67 y.o. male Cough - Plan: POCT CBC Rhinorrhea Allergic rhinitis, unspecified seasonality, unspecified trigger Allergic conjunctivitis, unspecified laterality - Plan: azelastine (OPTIVAR) 0.05 % ophthalmic solution Chronic obstructive pulmonary disease, unspecified COPD type (HCC)  -Cough may be related in part to postnasal drip with allergies or possible viral URI, in addition to COPD.  Reassuring exam.  Continue inhaler.  Stressed importance of daily use of Flonase, add Optivar eyedrops for allergic conjunctivitis symptoms, Claritin daily.  RTC precautions if not improving next week to 10 days, or worse sooner.  Low back pain, unspecified back pain laterality, unspecified chronicity, with sciatica presence unspecified - Plan: Ambulatory referral to Orthopedic Surgery, cyclobenzaprine (FLEXERIL) 5 MG tablet Abnormal bone xray - Plan: Ambulatory referral to Orthopedic Surgery  -Persistent low back pain, episodic left hip pain.  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  Denies new symptoms.  Will refill Flexeril and refer to orthopedics for further evaluation.  Thrush - Plan: POCT CBC, POCT Skin KOH, HIV antibody  -Negative in office testing, could have had previously.  May be  related to inhaler use.  Continue to rinse mouth after use of inhaler, check testing as above.  RTC precautions if worse symptoms.    Meds ordered this encounter  Medications  . azelastine (OPTIVAR) 0.05 % ophthalmic solution    Sig: Place 1 drop into both eyes 2 (two) times daily as needed. For itching/allergy symptoms    Dispense:  6 mL    Refill:  1  . cyclobenzaprine (FLEXERIL) 5 MG tablet    Sig: Take 1 tablet (5 mg total) by mouth 3 (three) times daily as needed for muscle spasms (start qhs prn due to sedation).    Dispense:  15 tablet    Refill:  1   Patient Instructions    Use flonase every day for congestion.  Claritin once per day. Optivar  eye drops for itchy eyes if needed as those may help if allergy cause.  Return if congestion is not improving in the next week to 10 days or any worsening symptoms such as fever or increased face pain,  worsening cough, worsening fatigue, or needing the albuterol more frequently.   You should start back on Spiriva daily, just make sure to rinse mouth after use. Discuss copd and meds with Dr. [name] as well.   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  Okay to use the Flexeril which is a different muscle relaxant up to every 8 hours/day but start with that at bedtime because it does make you sleepy. It is okay to use Tylenol with that medication for now for your back pain. If you have any worsening back pain, loss of feeling in your legs, weakness in the legs, loss of control of urine or stool,  or other worsening symptoms return here to the emergency room  If worsening mouth pain, or any persistent discoloration of tongue - please return for recheck to decide next step.   Please follow-up with me in 1 week to discuss any concerns we did not have a chance to discuss today.  If you have any worsening symptoms prior to that time please return here or the emergency room   Nasal Allergies Nasal allergies are a reaction to allergens in the air. Allergens are particles in the air that cause your body to have an allergic reaction. Nasal allergies are not passed from person to person (are not contagious). They cannot be cured, but they can be controlled. What are the causes? Seasonal nasal allergies (hay fever) are caused by pollen allergens that come from grasses, trees, and weeds. Year-round nasal allergies (perennial allergic rhinitis) are caused by allergens such as house dust mites, pet dander, and mold spores. What increases the risk? The following factors may make you more likely to develop this  condition:  Having certain health conditions. These include: ? Other types of allergies, such as food allergies. ? Asthma. ? Eczema.  Having a close relative who has allergies or asthma.  Exposure to house dust, pollen, dander, or other allergens at home or at work.  Exposure to air pollution or secondhand smoke when you were a child.  What are the signs or symptoms? Symptoms of this condition include:  Sneezing.  Runny nose or stuffy nose (congestion).  Watery (tearing) eyes.  Itchy eyes, nose, mouth, throat, skin, or other area.  Sore throat.  Headache.  Decreased sense of smell or taste.  Fatigue.  This may occur if you have trouble sleeping due to allergies.  Swollen eyelids.  How is this diagnosed? This condition is diagnosed with a medical history and physical exam. Allergy testing may be done to determine exactly what triggers your nasal allergies. How is this treated? There is no cure for nasal allergies. Treatment focuses on controlling your symptoms, and it may include:  Medicines that block allergy symptoms. These may include allergy shots, nasal sprays, and oral antihistamines.  Avoiding the allergen.  Follow these instructions at home:  Avoid the allergen that is causing your symptoms, if possible.  Keep windows closed. If possible, use air conditioning when pollen counts are high.  Do not use fans in your home.  Do not hang clothes outside to dry.  Wear sunglasses to keep pollen out of your eyes.  Wash your hands right away after you touch household pets.  Take over-the-counter and prescription medicines only as told by your health care provider.  Keep all follow-up visits as told by your health care provider. This is important. Contact a health care provider if:  You have a fever.  You develop a cough that does not go away (is persistent).  You start to wheeze.  Your symptoms do not improve with treatment.  You have thick nasal  discharge.  You start to have nosebleeds. Get help right away if:  Your tongue or your lips are swollen.  You have trouble breathing.  You feel light-headed or you feel like you are going to faint.  You have cold sweats. This information is not intended to replace advice given to you by your health care provider. Make sure you discuss any questions you have with your health care provider. Document Released: 09/04/2005 Document Revised: 02/07/2016 Document Reviewed: 03/17/2015 Elsevier Interactive Patient Education  2018 [employer].   IF you received an x-ray today, you will receive an invoice from [facility]. Please contact [facility] at [phone] with questions or concerns regarding your invoice.   IF you received labwork today, you will receive an invoice from [facility]. Please contact [facility] at [phone] with questions or concerns regarding your invoice.   Our billing staff will not be able to assist you with questions regarding bills from these companies.  You will be contacted with the lab results as soon as they are available. The fastest way to get your results is to activate your My Chart account. Instructions are located on the last page of this paperwork. If you have not heard from [place] regarding the results in 2 weeks, please contact this office.       Signed,   [name], MD [facility].  04/27/18 10:37 AM

## 2018-04-23 LAB — HIV ANTIBODY (ROUTINE TESTING W REFLEX): HIV Screen 4th Generation wRfx: NONREACTIVE

## 2018-04-29 ENCOUNTER — Encounter: Payer: Self-pay | Admitting: Family Medicine

## 2018-04-29 ENCOUNTER — Ambulatory Visit: Payer: Managed Care, Other (non HMO) | Admitting: Family Medicine

## 2018-04-29 ENCOUNTER — Ambulatory Visit: Payer: Managed Care, Other (non HMO) | Admitting: Emergency Medicine

## 2018-04-29 ENCOUNTER — Encounter: Payer: Self-pay | Admitting: Emergency Medicine

## 2018-04-29 VITALS — BP 138/88 | HR 78 | Temp 98.0°F | Ht 71.0 in | Wt 217.4 lb

## 2018-04-29 DIAGNOSIS — J449 Chronic obstructive pulmonary disease, unspecified: Secondary | ICD-10-CM

## 2018-04-29 DIAGNOSIS — K219 Gastro-esophageal reflux disease without esophagitis: Secondary | ICD-10-CM | POA: Diagnosis not present

## 2018-04-29 DIAGNOSIS — R0981 Nasal congestion: Secondary | ICD-10-CM

## 2018-04-29 DIAGNOSIS — M545 Low back pain: Secondary | ICD-10-CM | POA: Diagnosis not present

## 2018-04-29 DIAGNOSIS — J309 Allergic rhinitis, unspecified: Secondary | ICD-10-CM | POA: Diagnosis not present

## 2018-04-29 DIAGNOSIS — J019 Acute sinusitis, unspecified: Secondary | ICD-10-CM | POA: Diagnosis not present

## 2018-04-29 MED ORDER — MELOXICAM 7.5 MG PO TABS: 7.5000 mg | ORAL_TABLET | Freq: Every day | ORAL | 0 refills | Status: DC

## 2018-04-29 MED ORDER — AZITHROMYCIN 250 MG PO TABS: ORAL_TABLET | ORAL | 0 refills | Status: DC

## 2018-04-29 MED ORDER — UMECLIDINIUM BROMIDE 62.5 MCG/INH IN AEPB: 1.0000 | INHALATION_SPRAY | Freq: Every day | RESPIRATORY_TRACT | 5 refills | Status: DC

## 2018-04-29 NOTE — Progress Notes (Signed)
[name]    [id number]    03-Oct-1950  Primary Care Physician:Greene, Ranell Patrick, MD  Referring Physician: [name], MD [address] [place], [place] [address]  Chief complaint:  COPD  HPI: [name] is a 67yo former smoker M w/ PMH of HTN, GERD, Chronic sinusitis and Prostate cancer s/p proctectomy presenting to the clinic for management of his COPD. He states he has been 'feeling well' with no acute exacerbations on Spiriva. He had no dyspnea on exertion and intermittent cough usually at late evening while lying down. He is using his rescue inhaler once every 3-4 days. He has been having trouble with tongue irritation 2/2 presumed thrush associated with inhaler use. He states he has been stopping his Spiriva use with every thrush episode which have happened 2-3 times over the last 6 months. His most recent episode resolved about 2 weeks ago with diflucan tablets. Denies any F/N/V/D/C  Allergies as of 04/29/2018 - Review Complete 04/29/2018  Allergen Reaction Noted  . Percocet [oxycodone-acetaminophen] Other (See Comments) 11/17/2013  . Amoxicillin Rash 07/20/2014    Past Medical History:  Diagnosis Date  . Allergy    seasonal  . Arthritis    oa  . Asthma   . COPD (chronic obstructive pulmonary disease) ([place])   . GERD (gastroesophageal reflux disease)   . Hypertension   . Prostate cancer ([place]) 01/2016   prostatectomy   . Shortness of breath dyspnea     Past Surgical History:  Procedure Laterality Date  . HERNIA REPAIR  last done 3-4 yrs ago   x 2  . INGUINAL HERNIA REPAIR Right 11/20/2013   Procedure: OPEN REPAIR OF RECURRENT RIGHT INGUINAL HERNIA WITH MESH;  Surgeon: [name]. [name], MD;  [facility];  Service: General;  Laterality: Right;  . INSERTION OF MESH Right 11/20/2013   Procedure: INSERTION OF MESH;  Surgeon: [name]. [name], MD;  [facility]: [facility];  Service: General;  Laterality: Right;  . KNEE SURGERY Left july 2014   mcl and  meniscus repair  . LYMPHADENECTOMY Bilateral 02/09/2016   Procedure: PELVIC LYMPHADENECTOMY;  Surgeon: [name], MD;  [facility];  Service: Urology;  Laterality: Bilateral;  . right knee arthroscopy     . ROBOT ASSISTED LAPAROSCOPIC RADICAL PROSTATECTOMY N/A 02/09/2016   Procedure: XI ROBOTIC ASSISTED LAPAROSCOPIC RADICAL PROSTATECTOMY WITH INDOCYANINE GREEN DYE AND ADHESIOLYSIS;  Surgeon: [name], MD;  [facility];  Service: Urology;  Laterality: N/A;  . TOTAL KNEE ARTHROPLASTY Right 09/06/2015   Procedure: RIGHT TOTAL KNEE ARTHROPLASTY;  Surgeon: [name], MD;  [facility];  Service: Orthopedics;  Laterality: Right;     Review of systems: Review of Systems  Constitutional: Negative for fever and chills.  HENT: Negative.   Eyes: Negative for blurred vision.  Respiratory: as per HPI  Cardiovascular: Negative for chest pain and palpitations.  Gastrointestinal: Negative for vomiting, diarrhea, blood per rectum. All other systems reviewed and are negative.  Physical Exam: Blood pressure 128/90, pulse 90, height 5\' 11"  (1.803 m), weight 219 lb (99.3 kg), SpO2 94 %. Gen:      No acute distress HEENT:  EOMI, sclera anicteric, No obvious white lesions on tongue. Neck:     No masses; no thyromegaly Lungs:    Clear to auscultation bilaterally; normal respiratory effort CV:         Regular rate and rhythm; no murmurs Abd:      + bowel sounds; soft,  non-tender; no palpable masses, no distension Ext:    No edema; adequate peripheral perfusion Skin:      Warm and dry; no rash Neuro: alert and oriented x 3 Psych: normal mood and affect  A/P: COPD, mild Mr.Newill presenting for management of his COPD management. His symptoms are well controlled on Spiriva. Denies any acute exacerbations or night time symptoms. Rescue inhaler use once every 3-4 days. His insurance is no longer paying for Spiriva. He will need to switch to a different LAMA. He is also endorsing  recurrent thrush treated w/ diflucan  - Start Incruse Ellipta 62.10mcg 1 puff per day - If he cannot tolerate Incruse or insurance charge expensive co-pays, he may need to switch to cheaper maintenance med even if there's inconvenient dosing - Stop Spiriva - Continue to use your albuterol inhaler as needed for rescue    Allergic rhinitis Continue to use loratadine 10mg  daily and Flonase nasal spray  GERD (gastroesophageal reflux disease) Continue omeprazole 20mg  daily    Gilberto Better, PGY1 [facility] 04/29/2018, 5:51 PM  CC: [name], MD

## 2018-04-29 NOTE — Patient Instructions (Addendum)
For back pain, ok tho continue flexeril at bedtime, mobic once per day for pain during the day if needed until you see Dr. Durward Fortes.  I would not want to use that medicine long term due to potential side effects.   For sinus congestion, can continue flonase nasal spray every day for likely allergies, try saline nasal spray 3-4 times per day in each nostril, then if not improving in the next 4 to 5 days, start the antibiotic that was printed today.  If worsening symptoms, return to recheck and discuss other treatments.  Thank you for coming in today.   Sinusitis, Adult Sinusitis is soreness and inflammation of your sinuses. Sinuses are hollow spaces in the bones around your face. Your sinuses are located:  Around your eyes.  In the middle of your forehead.  Behind your nose.  In your cheekbones.  Your sinuses and nasal passages are lined with a stringy fluid (mucus). Mucus normally drains out of your sinuses. When your nasal tissues become inflamed or swollen, the mucus can become trapped or blocked so air cannot flow through your sinuses. This allows bacteria, viruses, and funguses to grow, which leads to infection. Sinusitis can develop quickly and last for 7?10 days (acute) or for more than 12 weeks (chronic). Sinusitis often develops after a cold. What are the causes? This condition is caused by anything that creates swelling in the sinuses or stops mucus from draining, including:  Allergies.  Asthma.  Bacterial or viral infection.  Abnormally shaped bones between the nasal passages.  Nasal growths that contain mucus (nasal polyps).  Narrow sinus openings.  Pollutants, such as chemicals or irritants in the air.  A foreign object stuck in the nose.  A fungal infection. This is rare.  What increases the risk? The following factors may make you more likely to develop this condition:  Having allergies or asthma.  Having had a recent cold or respiratory tract  infection.  Having structural deformities or blockages in your nose or sinuses.  Having a weak immune system.  Doing a lot of swimming or diving.  Overusing nasal sprays.  Smoking.  What are the signs or symptoms? The main symptoms of this condition are pain and a feeling of pressure around the affected sinuses. Other symptoms include:  Upper toothache.  Earache.  Headache.  Bad breath.  Decreased sense of smell and taste.  A cough that may get worse at night.  Fatigue.  Fever.  Thick drainage from your nose. The drainage is often green and it may contain pus (purulent).  Stuffy nose or congestion.  Postnasal drip. This is when extra mucus collects in the throat or back of the nose.  Swelling and warmth over the affected sinuses.  Sore throat.  Sensitivity to light.  How is this diagnosed? This condition is diagnosed based on symptoms, a medical history, and a physical exam. To find out if your condition is acute or chronic, your health care provider may:  Look in your nose for signs of nasal polyps.  Tap over the affected sinus to check for signs of infection.  View the inside of your sinuses using an imaging device that has a light attached (endoscope).  If your health care provider suspects that you have chronic sinusitis, you may also:  Be tested for allergies.  Have a sample of mucus taken from your nose (nasal culture) and checked for bacteria.  Have a mucus sample examined to see if your sinusitis is related to an  allergy.  If your sinusitis does not respond to treatment and it lasts longer than 8 weeks, you may have an MRI or CT scan to check your sinuses. These scans also help to determine how severe your infection is. In rare cases, a bone biopsy may be done to rule out more serious types of fungal sinus disease. How is this treated? Treatment for sinusitis depends on the cause and whether your condition is chronic or acute. If a virus is  causing your sinusitis, your symptoms will go away on their own within 10 days. You may be given medicines to relieve your symptoms, including:  Topical nasal decongestants. They shrink swollen nasal passages and let mucus drain from your sinuses.  Antihistamines. These drugs block inflammation that is triggered by allergies. This can help to ease swelling in your nose and sinuses.  Topical nasal corticosteroids. These are nasal sprays that ease inflammation and swelling in your nose and sinuses.  Nasal saline washes. These rinses can help to get rid of thick mucus in your nose.  If your condition is caused by bacteria, you will be given an antibiotic medicine. If your condition is caused by a fungus, you will be given an antifungal medicine. Surgery may be needed to correct underlying conditions, such as narrow nasal passages. Surgery may also be needed to remove polyps. Follow these instructions at home: Medicines  Take, use, or apply over-the-counter and prescription medicines only as told by your health care provider. These may include nasal sprays.  If you were prescribed an antibiotic medicine, take it as told by your health care provider. Do not stop taking the antibiotic even if you start to feel better. Hydrate and Humidify  Drink enough water to keep your urine clear or pale yellow. Staying hydrated will help to thin your mucus.  Use a cool mist humidifier to keep the humidity level in your home above 50%.  Inhale steam for 10-15 minutes, 3-4 times a day or as told by your health care provider. You can do this in the bathroom while a hot shower is running.  Limit your exposure to cool or dry air. Rest  Rest as much as possible.  Sleep with your head raised (elevated).  Make sure to get enough sleep each night. General instructions  Apply a warm, moist washcloth to your face 3-4 times a day or as told by your health care provider. This will help with discomfort.  Wash  your hands often with soap and water to reduce your exposure to viruses and other germs. If soap and water are not available, use hand sanitizer.  Do not smoke. Avoid being around people who are smoking (secondhand smoke).  Keep all follow-up visits as told by your health care provider. This is important. Contact a health care provider if:  You have a fever.  Your symptoms get worse.  Your symptoms do not improve within 10 days. Get help right away if:  You have a severe headache.  You have persistent vomiting.  You have pain or swelling around your face or eyes.  You have vision problems.  You develop confusion.  Your neck is stiff.  You have trouble breathing. This information is not intended to replace advice given to you by your health care provider. Make sure you discuss any questions you have with your health care provider. Document Released: 09/04/2005 Document Revised: 04/30/2016 Document Reviewed: 06/30/2015 Elsevier Interactive Patient Education  2018 [employer].     IF you received  an x-ray today, you will receive an invoice from [facility]. Please contact [facility] at [phone] with questions or concerns regarding your invoice.   IF you received labwork today, you will receive an invoice from [facility]. Please contact [facility] at [phone] with questions or concerns regarding your invoice.   Our billing staff will not be able to assist you with questions regarding bills from these companies.  You will be contacted with the lab results as soon as they are available. The fastest way to get your results is to activate your My Chart account. Instructions are located on the last page of this paperwork. If you have not heard from [place] regarding the results in 2 weeks, please contact this office.

## 2018-04-29 NOTE — Assessment & Plan Note (Signed)
Continue to use loratadine 10mg  daily and Flonase nasal spray

## 2018-04-29 NOTE — Assessment & Plan Note (Addendum)
[name] presenting for management of his COPD management. His symptoms are well controlled on Spiriva. Denies any acute exacerbations or night time symptoms. Rescue inhaler use once every 3-4 days. His insurance is no longer paying for Spiriva. He will need to switch to a different LAMA. He is also endorsing recurrent thrush treated w/ diflucan  - Start Incruse Ellipta 62.51mcg 1 puff per day - If he cannot tolerate Incruse or insurance charge expensive co-pays, he may need to switch to cheaper maintenance med even if there's inconvenient dosing - Stop Spiriva - Continue to use your albuterol inhaler as needed for rescue

## 2018-04-29 NOTE — Progress Notes (Signed)
Subjective:  By signing my name below, I, [name], attest that this documentation has been prepared under the direction and in the presence of [name], MD Electronically Signed: Ladene Artist, ED Scribe 04/29/2018 at 5:01 PM.   Patient ID: [name], male    DOB: June 19, 1951, 67 y.o.   MRN: [id number]  Chief Complaint  Patient presents with  . left side face still congested  . back ache    left side extremities tingling still there   HPI [name] "[name]" is a 67 y.o. male who presents to Primary Care at [facility] for f/u. Last seen 8/5 for multiple concerns.  Nasal Congestion See last visit. Recommended using Flonase daily for poss allergy cause. Thought that cough was also related to allergies with postnasal drip. Additionally recommended to use Spiriva and Claritin daily. Started optivar eye drops for allergic conjunctivitis. - Pt reports that he still has L sided nasal congestion x 3 wks, now having some L-sided HA, sinus pressure, fullness in L ear as well. He has been using Flonase 2 puffs daily without relief and optivar with some relief of eye itching. Pt has not started Claritin, states he forgot to pick it up.  Thrush Thought to have thrush prior to last OV that was improving after home use of clotrimazole lozenges. In office testing, neg. Recommended to continue ringing mouth after using Spiriva.  Low Back Pain and L Hip Pain See last OV. Flexeril was refilled. Referred to ortho as back pain was chronic that flared over the past few yrs. He does work as a Scientist, water quality at [employer], difficult to work with flares. Minimal relief with Aleve once/wk. 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 Did report back pain into both sides of buttocks at times when discussed last OV. Poss paget's on prior CT imaging on L hip. Referred to ortho, prescribed flexeril and tylenol OTC. - Pt states that he likes flexeril at night as it improves pain and helps him sleep without  hangover effect, but he requests a daytime medication. He has tried Tylenol without relief and 800 mg ibuprofen with relief of back and shoulder pain. Pt has an upcoming appointment with ortho Dr. Durward Torres on 9/9.  COPD Pulmonology: Dr. [name]. Spiriva was changed to Incruse Ellipta 62.5 1 puff qd.  Patient Active Problem List   Diagnosis Date Noted  . Allergic rhinitis 03/12/2017  . Thrush, oral 02/27/2017  . SUI (stress urinary incontinence), male 03/20/2016  . Prostate cancer ([place]) 02/09/2016  . Obese 09/08/2015  . S/P right TKA 09/06/2015  . S/P knee replacement 09/06/2015  . COPD, mild ([place]) 07/20/2014  . Pulmonary nodules 02/23/2014  . Tobacco use disorder, moderate, in sustained remission 02/23/2014  . Recurrent right inguinal hernia 10/20/2013  . ED (erectile dysfunction) 08/26/2012  . Physical exam, annual 11/05/2011  . HTN (hypertension) 11/05/2011  . GERD (gastroesophageal reflux disease) 11/05/2011  . Chronic joint pain 11/05/2011   Past Medical History:  Diagnosis Date  . Allergy    seasonal  . Arthritis    oa  . Asthma   . COPD (chronic obstructive pulmonary disease) ([place])   . GERD (gastroesophageal reflux disease)   . Hypertension   . Prostate cancer ([place]) 01/2016   prostatectomy   . Shortness of breath dyspnea    Past Surgical History:  Procedure Laterality Date  . HERNIA REPAIR  last done 3-4 yrs ago   x 2  . INGUINAL HERNIA REPAIR Right  11/20/2013   Procedure: OPEN REPAIR OF RECURRENT RIGHT INGUINAL HERNIA WITH MESH;  Surgeon: [name]. [name], MD;  Location: [facility];  Service: General;  Laterality: Right;  . INSERTION OF MESH Right 11/20/2013   Procedure: INSERTION OF MESH;  Surgeon: [name]. [name], MD;  Location: [facility];  Service: General;  Laterality: Right;  . KNEE SURGERY Left july 2014   mcl and meniscus repair  . LYMPHADENECTOMY Bilateral 02/09/2016   Procedure: PELVIC LYMPHADENECTOMY;  Surgeon: [name], MD;  Location: [facility];  Service:  Urology;  Laterality: Bilateral;  . right knee arthroscopy     . ROBOT ASSISTED LAPAROSCOPIC RADICAL PROSTATECTOMY N/A 02/09/2016   Procedure: XI ROBOTIC ASSISTED LAPAROSCOPIC RADICAL PROSTATECTOMY WITH INDOCYANINE GREEN DYE AND ADHESIOLYSIS;  Surgeon: [name], MD;  Location: [facility];  Service: Urology;  Laterality: N/A;  . TOTAL KNEE ARTHROPLASTY Right 09/06/2015   Procedure: RIGHT TOTAL KNEE ARTHROPLASTY;  Surgeon: [name], MD;  Location: [facility];  Service: Orthopedics;  Laterality: Right;   Allergies  Allergen Reactions  . Percocet [Oxycodone-Acetaminophen] Other (See Comments)    Nausea-Doesn't work  . Amoxicillin Rash    Has patient had a PCN reaction causing immediate rash, facial/tongue/throat swelling, SOB or lightheadedness with hypotension: No Has patient had a PCN reaction causing severe rash involving mucus membranes or skin necrosis: No Has patient had a PCN reaction that required hospitalization No Has patient had a PCN reaction occurring within the last 10 years: Yes- 2016 If all of the above answers are "NO", then may proceed with Cephalosporin use.    Prior to Admission medications   Medication Sig Start Date End Date Taking? Authorizing Provider  albuterol (PROVENTIL HFA;VENTOLIN HFA) 108 (90 Base) MCG/ACT inhaler Inhale 2 puffs into the lungs every 6 (six) hours as needed for wheezing or shortness of breath. 03/12/17   Byrum, Rose Fillers, MD  amLODipine (NORVASC) 5 MG tablet Take 1 tablet (5 mg total) by mouth daily. 03/27/18   [name], MD  azelastine (OPTIVAR) 0.05 % ophthalmic solution Place 1 drop into both eyes 2 (two) times daily as needed. For itching/allergy symptoms 04/22/18   [name], MD  clobetasol ointment (TEMOVATE) 0.05 % Apply topically 2 (two) times daily. Apply bid to itchy rash, do not use on face, genitals, axillae Patient taking differently: Apply 1 application topically 2 (two) times daily as needed (Apply to itchy rash, do not use  on face, genitals, axillae.).  02/24/13   Orma Flaming, MD  cyclobenzaprine (FLEXERIL) 5 MG tablet Take 1 tablet (5 mg total) by mouth 3 (three) times daily as needed for muscle spasms (start qhs prn due to sedation). 04/22/18   [name], MD  esomeprazole (NEXIUM) 40 MG capsule TAKE 1 CAPSULE BY MOUTH DAILY 11/30/17   [name], MD  fluconazole (DIFLUCAN) 100 MG tablet Take 1 tablet (100 mg total) by mouth daily. 03/12/17   Collene Gobble, MD  fluticasone (FLONASE) 50 MCG/ACT nasal spray PLACE 2 SPRAYS INTO BOTH NOSTRILS DAILY AS NEEDED FOR ALLERGIES. 02/21/18   [name], MD  KLS ALLERCLEAR 10 MG tablet TAKE 1 TABLET BY MOUTH DAILY 08/15/17   Collene Gobble, MD  KLS ESOMEPRAZOLE MAGNESIUM 20 MG capsule TAKE TWO CAPSULES BY MOUTH DAILY  03/28/18   [name], MD  Naphazoline-Pheniramine (OPCON-A) 0.027-0.315 % SOLN Apply 1 drop to eye 2 (two) times daily as needed (dry/irritated eyes).    [provider]  omeprazole ([place])  20 MG capsule Take 1-2 capsules (20-40 mg total) by mouth daily as needed. 03/27/18   [name], MD  traMADol (ULTRAM) 50 MG tablet Take 1 tablet (50 mg total) by mouth every 6 (six) hours as needed. 12/18/17   [name], MD  triamcinolone cream (KENALOG) 0.1 % Apply 1 application topically 2 (two) times daily as needed. To affected areas only as needed. 08/07/16   [name], MD  umeclidinium bromide (INCRUSE ELLIPTA) 62.5 MCG/INH AEPB Inhale 1 puff into the lungs daily. 04/29/18   Collene Gobble, MD   Social History   Socioeconomic History  . Marital status: Married    Spouse name: Not on file  . Number of children: Not on file  . Years of education: Not on file  . Highest education level: Not on file  Occupational History  . Not on file  Social Needs  . Financial resource strain: Not on file  . Food insecurity:    Worry: Not on file    Inability: Not on file  . Transportation needs:    Medical: Not on file      Non-medical: Not on file  Tobacco Use  . Smoking status: Former Smoker    Packs/day: 1.50    Years: 20.00    Pack years: 30.00    Types: Cigarettes    Last attempt to quit: 11/05/1999    Years since quitting: 18.4  . Smokeless tobacco: Never Used  Substance and Sexual Activity  . Alcohol use: No    Comment: last used 25 years ago   . Drug use: Yes    Types: Cocaine    Comment: last used cocaine  and acid 25 yrs ago,none used since  . Sexual activity: Not on file  Lifestyle  . Physical activity:    Days per week: Not on file    Minutes per session: Not on file  . Stress: Not on file  Relationships  . Social connections:    Talks on phone: Not on file    Gets together: Not on file    Attends religious service: Not on file    Active member of club or organization: Not on file    Attends meetings of clubs or organizations: Not on file    Relationship status: Not on file  . Intimate partner violence:    Fear of current or ex partner: Not on file    Emotionally abused: Not on file    Physically abused: Not on file    Forced sexual activity: Not on file  Other Topics Concern  . Not on file  Social History Narrative  . Not on file   Review of Systems  HENT: Positive for congestion and sinus pressure.   Musculoskeletal: Positive for back pain.  Neurological: Positive for headaches.      Objective:   Physical Exam  Constitutional: He is oriented to person, place, and time. He appears well-developed and well-nourished.  HENT:  Head: Normocephalic and atraumatic.  Right Ear: Tympanic membrane, external ear and ear canal normal.  Left Ear: Tympanic membrane, external ear and ear canal normal.  Nose: Mucosal edema (Slight, L) present. No rhinorrhea.  Mouth/Throat: Oropharynx is clear and moist and mucous membranes are normal. No oropharyngeal exudate or posterior oropharyngeal erythema.  Nose: Slight pressure sensation with percussion of L maxillary sinus. No active  discharge Mouth/Throat: No drainage  Eyes: Pupils are equal, round, and reactive to light. Conjunctivae are normal.  Neck: Neck supple.  Cardiovascular: Normal rate, regular rhythm, normal heart sounds and intact distal pulses.  No murmur heard. Pulmonary/Chest: Effort normal and breath sounds normal. He has no wheezes. He has no rhonchi. He has no rales.  Abdominal: Soft. There is no tenderness.  Lymphadenopathy:    He has no cervical adenopathy.  Neurological: He is alert and oriented to person, place, and time.  Skin: Skin is warm and dry. No rash noted.  Psychiatric: He has a normal mood and affect. His behavior is normal.  Vitals reviewed.   Vitals:   04/29/18 1648  BP: 138/88  Pulse: 78  Temp: 98 F (36.7 C)  TempSrc: Oral  SpO2: 97%  Weight: 217 lb 6.4 oz (98.6 kg)  Height: 5\' 11"  (1.803 m)      Assessment & Plan:    [name] is a 67 y.o. male Low back pain, unspecified back pain laterality, unspecified chronicity, with sciatica presence unspecified - Plan: meloxicam (MOBIC) 7.5 MG tablet  -Continue follow-up as planned with orthopedics.  Mobic refilled temporarily, but discussed short-term use ideally, and potential risks.  Continue Flexeril at bedtime as needed.  Sinus congestion Subacute sinusitis, unspecified location - Plan: azithromycin (ZITHROMAX) 250 MG tablet  -Continue treatment with Flonase, saline nasal spray, trial of Claritin, then azithromycin if not improving.  RTC precautions given.  Meds ordered this encounter  Medications  . meloxicam (MOBIC) 7.5 MG tablet    Sig: Take 1 tablet (7.5 mg total) by mouth daily. As needed    Dispense:  30 tablet    Refill:  0  . azithromycin (ZITHROMAX) 250 MG tablet    Sig: Take 2 pills by mouth on day 1, then 1 pill by mouth per day on days 2 through 5.    Dispense:  6 tablet    Refill:  0   Patient Instructions   For back pain, ok tho continue flexeril at bedtime, mobic once per day for pain during  the day if needed until you see Dr. Durward Torres.  I would not want to use that medicine long term due to potential side effects.   For sinus congestion, can continue flonase nasal spray every day for likely allergies, try saline nasal spray 3-4 times per day in each nostril, then if not improving in the next 4 to 5 days, start the antibiotic that was printed today.  If worsening symptoms, return to recheck and discuss other treatments.  Thank you for coming in today.   Sinusitis, Adult Sinusitis is soreness and inflammation of your sinuses. Sinuses are hollow spaces in the bones around your face. Your sinuses are located:  Around your eyes.  In the middle of your forehead.  Behind your nose.  In your cheekbones.  Your sinuses and nasal passages are lined with a stringy fluid (mucus). Mucus normally drains out of your sinuses. When your nasal tissues become inflamed or swollen, the mucus can become trapped or blocked so air cannot flow through your sinuses. This allows bacteria, viruses, and funguses to grow, which leads to infection. Sinusitis can develop quickly and last for 7?10 days (acute) or for more than 12 weeks (chronic). Sinusitis often develops after a cold. What are the causes? This condition is caused by anything that creates swelling in the sinuses or stops mucus from draining, including:  Allergies.  Asthma.  Bacterial or viral infection.  Abnormally shaped bones between the nasal passages.  Nasal growths that contain mucus (nasal polyps).  Narrow sinus openings.  Pollutants, such  as chemicals or irritants in the air.  A foreign object stuck in the nose.  A fungal infection. This is rare.  What increases the risk? The following factors may make you more likely to develop this condition:  Having allergies or asthma.  Having had a recent cold or respiratory tract infection.  Having structural deformities or blockages in your nose or sinuses.  Having a weak  immune system.  Doing a lot of swimming or diving.  Overusing nasal sprays.  Smoking.  What are the signs or symptoms? The main symptoms of this condition are pain and a feeling of pressure around the affected sinuses. Other symptoms include:  Upper toothache.  Earache.  Headache.  Bad breath.  Decreased sense of smell and taste.  A cough that may get worse at night.  Fatigue.  Fever.  Thick drainage from your nose. The drainage is often green and it may contain pus (purulent).  Stuffy nose or congestion.  Postnasal drip. This is when extra mucus collects in the throat or back of the nose.  Swelling and warmth over the affected sinuses.  Sore throat.  Sensitivity to light.  How is this diagnosed? This condition is diagnosed based on symptoms, a medical history, and a physical exam. To find out if your condition is acute or chronic, your health care provider may:  Look in your nose for signs of nasal polyps.  Tap over the affected sinus to check for signs of infection.  View the inside of your sinuses using an imaging device that has a light attached (endoscope).  If your health care provider suspects that you have chronic sinusitis, you may also:  Be tested for allergies.  Have a sample of mucus taken from your nose (nasal culture) and checked for bacteria.  Have a mucus sample examined to see if your sinusitis is related to an allergy.  If your sinusitis does not respond to treatment and it lasts longer than 8 weeks, you may have an MRI or CT scan to check your sinuses. These scans also help to determine how severe your infection is. In rare cases, a bone biopsy may be done to rule out more serious types of fungal sinus disease. How is this treated? Treatment for sinusitis depends on the cause and whether your condition is chronic or acute. If a virus is causing your sinusitis, your symptoms will go away on their own within 10 days. You may be given  medicines to relieve your symptoms, including:  Topical nasal decongestants. They shrink swollen nasal passages and let mucus drain from your sinuses.  Antihistamines. These drugs block inflammation that is triggered by allergies. This can help to ease swelling in your nose and sinuses.  Topical nasal corticosteroids. These are nasal sprays that ease inflammation and swelling in your nose and sinuses.  Nasal saline washes. These rinses can help to get rid of thick mucus in your nose.  If your condition is caused by bacteria, you will be given an antibiotic medicine. If your condition is caused by a fungus, you will be given an antifungal medicine. Surgery may be needed to correct underlying conditions, such as narrow nasal passages. Surgery may also be needed to remove polyps. Follow these instructions at home: Medicines  Take, use, or apply over-the-counter and prescription medicines only as told by your health care provider. These may include nasal sprays.  If you were prescribed an antibiotic medicine, take it as told by your health care provider. Do not stop  taking the antibiotic even if you start to feel better. Hydrate and Humidify  Drink enough water to keep your urine clear or pale yellow. Staying hydrated will help to thin your mucus.  Use a cool mist humidifier to keep the humidity level in your home above 50%.  Inhale steam for 10-15 minutes, 3-4 times a day or as told by your health care provider. You can do this in the bathroom while a hot shower is running.  Limit your exposure to cool or dry air. Rest  Rest as much as possible.  Sleep with your head raised (elevated).  Make sure to get enough sleep each night. General instructions  Apply a warm, moist washcloth to your face 3-4 times a day or as told by your health care provider. This will help with discomfort.  Wash your hands often with soap and water to reduce your exposure to viruses and other germs. If soap  and water are not available, use hand sanitizer.  Do not smoke. Avoid being around people who are smoking (secondhand smoke).  Keep all follow-up visits as told by your health care provider. This is important. Contact a health care provider if:  You have a fever.  Your symptoms get worse.  Your symptoms do not improve within 10 days. Get help right away if:  You have a severe headache.  You have persistent vomiting.  You have pain or swelling around your face or eyes.  You have vision problems.  You develop confusion.  Your neck is stiff.  You have trouble breathing. This information is not intended to replace advice given to you by your health care provider. Make sure you discuss any questions you have with your health care provider. Document Released: 09/04/2005 Document Revised: 04/30/2016 Document Reviewed: 06/30/2015 Elsevier Interactive Patient Education  2018 [employer].     IF you received an x-ray today, you will receive an invoice from [facility]. Please contact [facility] at [phone] with questions or concerns regarding your invoice.   IF you received labwork today, you will receive an invoice from [facility]. Please contact [facility] at [phone] with questions or concerns regarding your invoice.   Our billing staff will not be able to assist you with questions regarding bills from these companies.  You will be contacted with the lab results as soon as they are available. The fastest way to get your results is to activate your My Chart account. Instructions are located on the last page of this paperwork. If you have not heard from [place] regarding the results in 2 weeks, please contact this office.       I personally performed the services described in this documentation, which was scribed in my presence. The recorded information has been reviewed and considered for accuracy and completeness, addended by me as needed, and agree with  information above.  Signed,   [name], MD [facility].  05/05/18 3:57 PM

## 2018-04-29 NOTE — Patient Instructions (Addendum)
Mr.Erhart  Thank you for coming in. Here are the recommendations based on what we discussed today:  - Start Incruse Ellipta 62.29mcg 1 puff per day - Please let us know if you have significant side effects or lack of improvement with your breathing - Stop Spiriva (but don't throw them away) - Continue to use your albuterol inhaler as needed for rescue - Please continue taking your omeprazole and Flonase for heart burn and sinus symptoms

## 2018-04-29 NOTE — Assessment & Plan Note (Signed)
-  Continue omeprazole 20 mg daily

## 2018-05-27 ENCOUNTER — Ambulatory Visit (INDEPENDENT_AMBULATORY_CARE_PROVIDER_SITE_OTHER): Payer: Managed Care, Other (non HMO) | Admitting: Orthopaedic Surgery

## 2018-06-04 ENCOUNTER — Other Ambulatory Visit: Payer: Self-pay | Admitting: Family Medicine

## 2018-06-05 ENCOUNTER — Other Ambulatory Visit: Payer: Self-pay | Admitting: Family Medicine

## 2018-06-05 NOTE — Telephone Encounter (Signed)
[facility] would like to see if this can be written for 90 days instead of 21 days so that insurance will cover the medication in whole.

## 2018-06-05 NOTE — Telephone Encounter (Signed)
Please review chart for refill- medication is on med list twice- listed at 20 mg twice daily and 40 mg once daily. Not sure about this 21 day refill.  Please send for provider review to discontinue one of these if not needed.

## 2018-06-10 ENCOUNTER — Encounter: Payer: Self-pay | Admitting: Emergency Medicine

## 2018-06-10 ENCOUNTER — Ambulatory Visit: Payer: Managed Care, Other (non HMO) | Admitting: Emergency Medicine

## 2018-06-10 VITALS — BP 124/86 | HR 83 | Ht 71.0 in | Wt 218.0 lb

## 2018-06-10 DIAGNOSIS — K219 Gastro-esophageal reflux disease without esophagitis: Secondary | ICD-10-CM | POA: Diagnosis not present

## 2018-06-10 DIAGNOSIS — J309 Allergic rhinitis, unspecified: Secondary | ICD-10-CM | POA: Diagnosis not present

## 2018-06-10 DIAGNOSIS — Z23 Encounter for immunization: Secondary | ICD-10-CM | POA: Diagnosis not present

## 2018-06-10 DIAGNOSIS — R918 Other nonspecific abnormal finding of lung field: Secondary | ICD-10-CM

## 2018-06-10 DIAGNOSIS — J449 Chronic obstructive pulmonary disease, unspecified: Secondary | ICD-10-CM

## 2018-06-10 DIAGNOSIS — B370 Candidal stomatitis: Secondary | ICD-10-CM

## 2018-06-10 NOTE — Patient Instructions (Addendum)
Please continue your Incruse 1 inhalation daily.  Continue to rinse and gargle after you use this medication. Keep your albuterol available to use 2 puffs up to every 4 hours if needed for shortness of breath, chest tightness, wheezing. Please continue your Flonase nasal spray and as omeprazole as you have been taking them.  I agree with using these every day on a regular schedule. Get your flu shot this fall as planned. You would benefit from getting an pneumonia vaccine.  Follow with Dr [name] in 6 months or sooner if you have any problems

## 2018-06-10 NOTE — Progress Notes (Signed)
Subjective:    Patient ID: [name], male    DOB: 15-Jun-1951, 67 y.o.   MRN: [id number]  HPI  ROV 06/10/18 --67 year old former smoker (30 pack years) with a history of COPD, benign pulmonary nodules that we followed with serial CT scans of the chest.  He has some chronic cough in the setting of GERD, chronic rhinitis.  He is also been dealing with mouth soreness and tongue soreness that have been thought to possibly been related to thrush versus irritation from his medications. Last time we started him on Incruse, he feels that he is benefiting, breathing well. He is not having any increased cough or mouth sx on it so far. No wheeze. No flares since last time. He is using flonase daily, generic esomeprazole 40mg  daily.     Review of Systems  Constitutional: Negative for fever and unexpected weight change.  HENT: Negative for congestion, dental problem, ear pain, nosebleeds, postnasal drip, rhinorrhea, sinus pressure, sneezing, sore throat and trouble swallowing.   Eyes: Negative for redness and itching.  Respiratory: Positive for shortness of breath and wheezing. Negative for chest tightness.   Cardiovascular: Negative for palpitations and leg swelling.       Knees   Gastrointestinal: Negative for nausea and vomiting.  Genitourinary: Negative for dysuria.  Musculoskeletal: Negative for joint swelling.  Skin: Negative for rash.  Neurological: Negative for headaches.  Hematological: Does not bruise/bleed easily.  Psychiatric/Behavioral: Negative for dysphoric mood. The patient is not nervous/anxious.         Objective:   Physical Exam Vitals:   06/10/18 1013  BP: 124/86  Pulse: 83  SpO2: 95%  Weight: 218 lb (98.9 kg)  Height: 5\' 11"  (1.803 m)   Gen: Pleasant, well-nourished, in no distress,  normal affect  ENT: No lesions,  mouth clear,  oropharynx clear, no postnasal drip  Neck: No JVD, no TMG, no carotid bruits  Lungs: No use of accessory muscles, clear without  rales or rhonchi  Cardiovascular: RRR, heart sounds normal, no murmur or gallops, no peripheral edema  Musculoskeletal: No deformities, no cyanosis or clubbing  Neuro: alert, non focal  Skin: Warm, no lesions or rashes  07/13/14 --  COMPARISON: 01/05/2014  FINDINGS: Mediastinum: Normal heart size. No pericardial effusion. The trachea appears patent and is midline. Normal appearance of the esophagus. No mediastinal or hilar adenopathy identified.  Lungs/Pleura: There is no pleural effusion identified. Index nodule in the right upper lobe measures 4 mm and is unchanged from previous exam. Subpleural nodule within the anterior right upper lobe is unchanged measuring 4 mm, image 24/series 3. Subpleural right middle lobe nodule is unchanged measuring 3 mm, image 33/series 3. No new or enlarging pulmonary nodules or mass is identified. Sub solid lesion within the right base measures 9 mm, image 105 of series 602. This is stable when compared with 01/05/2014 but is new from 07/04/2013.  Upper Abdomen: The visualized portions of the liver, gallbladder, and pancreas are unremarkable. The spleen is negative.  Musculoskeletal: Review of the visualized bony structures is on unremarkable. No aggressive lytic or sclerotic bone lesions.  IMPRESSION: 1. Stable small solid pulmonary nodules. Compatible with benign lesions. 2. There is a 9 mm sub solid lesion in the right base which is unchanged from 01/05/2014 but new from 07/04/2013. This will need annual surveillance for minimum of 36 months to document of benignity. The next followup examination should be obtained on 07/14/2015. A PET-CT would be of limited value in evaluating this  lesion. This recommendation follows the consensus statement: Recommendations for the Management of Subsolid Pulmonary Nodules Detected at CT: A Statement from the [employer] as published in Radiology 2013; 266:304-317.     Assessment & Plan:   COPD, mild He is tolerating the Incruse, benefiting from it.  He has not had any side effects including mouth soreness or thrush which have been a problem for him.  We will continue.  Use albuterol as needed.  He is planning to get the flu shot at work.  He needs 1 of the pneumonia vaccines.  Please continue your Incruse 1 inhalation daily.  Continue to rinse and gargle after you use this medication. Keep your albuterol available to use 2 puffs up to every 4 hours if needed for shortness of breath, chest tightness, wheezing. Get your flu shot this fall as planned. You would benefit from getting an pneumonia vaccine.  Follow with Dr [name] in 6 months or sooner if you have any problems  Allergic rhinitis Continue fluticasone nasal spray 2 sprays each nostril daily  GERD (gastroesophageal reflux disease) Continue esomeprazole as he has been taking it  Thrush, oral Appears to be resolved  Pulmonary nodules Benign based on serial CT scans  Baltazar Apo, MD, PhD 06/10/2018, 10:41 AM [facility] [phone] or if no answer [phone]

## 2018-06-10 NOTE — Assessment & Plan Note (Addendum)
Continue esomeprazole as he has been taking it

## 2018-06-10 NOTE — Assessment & Plan Note (Signed)
Appears to be resolved. 

## 2018-06-10 NOTE — Assessment & Plan Note (Signed)
Benign based on serial CT scans

## 2018-06-10 NOTE — Assessment & Plan Note (Signed)
Continue fluticasone nasal spray 2 sprays each nostril daily

## 2018-06-10 NOTE — Addendum Note (Signed)
Addended by: Len Blalock on: 06/10/2018 10:45 AM   Modules accepted: Orders

## 2018-06-10 NOTE — Assessment & Plan Note (Signed)
He is tolerating the Incruse, benefiting from it.  He has not had any side effects including mouth soreness or thrush which have been a problem for him.  We will continue.  Use albuterol as needed.  He is planning to get the flu shot at work.  He needs 1 of the pneumonia vaccines.  Please continue your Incruse 1 inhalation daily.  Continue to rinse and gargle after you use this medication. Keep your albuterol available to use 2 puffs up to every 4 hours if needed for shortness of breath, chest tightness, wheezing. Get your flu shot this fall as planned. You would benefit from getting an pneumonia vaccine.  Follow with Dr [name] in 6 months or sooner if you have any problems

## 2018-06-20 ENCOUNTER — Other Ambulatory Visit: Payer: Self-pay | Admitting: Family Medicine

## 2018-06-20 DIAGNOSIS — M545 Low back pain, unspecified: Secondary | ICD-10-CM

## 2018-07-16 ENCOUNTER — Telehealth: Payer: Self-pay | Admitting: Emergency Medicine

## 2018-07-16 NOTE — Telephone Encounter (Signed)
Called and spoke with pt and clarified the date and the injection that he received.  Pt expressed understanding.nothing further needed.

## 2018-07-16 NOTE — Telephone Encounter (Signed)
He received the Prevnae 13.  ATC pt, no answer. Left message for pt to call back.

## 2018-07-16 NOTE — Telephone Encounter (Signed)
Pt is calling back [phone]

## 2018-08-19 ENCOUNTER — Other Ambulatory Visit: Payer: Self-pay

## 2018-08-19 DIAGNOSIS — J301 Allergic rhinitis due to pollen: Secondary | ICD-10-CM

## 2018-08-19 MED ORDER — FLUTICASONE PROPIONATE 50 MCG/ACT NA SUSP: 2.0000 | Freq: Every day | NASAL | 5 refills | Status: DC

## 2018-08-27 ENCOUNTER — Other Ambulatory Visit: Payer: Self-pay | Admitting: Family Medicine

## 2018-08-28 NOTE — Telephone Encounter (Signed)
Left message for patient to call office to schedule a physical.

## 2018-08-28 NOTE — Telephone Encounter (Signed)
Requested medication (s) are due for refill today: yes  Requested medication (s) are on the active medication list: yes  Last refill:  06/10/18   #90  Daneil Dolin  Future visit scheduled: no needs physical  Notes to clinic:  Call placed to patient to call office to schedule appointment for physical. This medication has not been address in any recent office visit.    Requested Prescriptions  Pending Prescriptions Disp Refills   KLS ESOMEPRAZOLE MAGNESIUM 20 MG capsule [Pharmacy Med Name: KLS Esomeprazole Magnesium Oral Capsule Delayed Release 20 MG] 84 capsule 0    Sig: TAKE TWO CAPSULES BY MOUTH DAILY     Gastroenterology: Proton Pump Inhibitors Passed - 08/28/2018  4:08 PM      Passed - Valid encounter within last 12 months    Recent Outpatient Visits          4 months ago Low back pain, unspecified back pain laterality, unspecified chronicity, with sciatica presence unspecified   [facility], Ranell Patrick, MD   4 months ago Cough   [facility], Ranell Patrick, MD   8 months ago Diverticulitis of colon   [facility], MD   8 months ago Diverticulitis   [facility], Ranell Patrick, MD   8 months ago Left flank pain   [facility], Ranell Patrick, MD

## 2018-09-02 ENCOUNTER — Other Ambulatory Visit: Payer: Self-pay | Admitting: Family Medicine

## 2018-09-02 DIAGNOSIS — M545 Low back pain, unspecified: Secondary | ICD-10-CM

## 2018-10-21 ENCOUNTER — Encounter: Payer: Managed Care, Other (non HMO) | Admitting: Family Medicine

## 2018-11-13 ENCOUNTER — Encounter: Payer: 59 | Admitting: Family Medicine

## 2018-11-25 ENCOUNTER — Encounter: Payer: 59 | Admitting: Family Medicine

## 2018-12-09 ENCOUNTER — Other Ambulatory Visit: Payer: Self-pay | Admitting: Family Medicine

## 2018-12-12 ENCOUNTER — Telehealth: Payer: Self-pay | Admitting: Emergency Medicine

## 2018-12-12 NOTE — Telephone Encounter (Signed)
Returned call to patient's spouse Re-iterated precautions to take for COVID ie: handwashing and contacting face with hands. She was just checking to see if b/c of COPD he should continue working. 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 She voices understanding. Nothing further needed.

## 2018-12-30 ENCOUNTER — Encounter: Payer: Self-pay | Admitting: Family Medicine

## 2018-12-30 ENCOUNTER — Other Ambulatory Visit: Payer: Self-pay

## 2018-12-30 ENCOUNTER — Ambulatory Visit (INDEPENDENT_AMBULATORY_CARE_PROVIDER_SITE_OTHER): Payer: 59 | Admitting: Family Medicine

## 2018-12-30 VITALS — BP 128/82 | HR 106 | Temp 98.8°F | Resp 16 | Wt 215.8 lb

## 2018-12-30 DIAGNOSIS — R51 Headache: Secondary | ICD-10-CM | POA: Diagnosis not present

## 2018-12-30 DIAGNOSIS — Z125 Encounter for screening for malignant neoplasm of prostate: Secondary | ICD-10-CM | POA: Diagnosis not present

## 2018-12-30 DIAGNOSIS — I10 Essential (primary) hypertension: Secondary | ICD-10-CM | POA: Diagnosis not present

## 2018-12-30 DIAGNOSIS — R519 Headache, unspecified: Secondary | ICD-10-CM

## 2018-12-30 NOTE — Patient Instructions (Addendum)
Based on your current symptoms, I suspect the headache may be related to allergies. Continue flonase nasal spray 2 sprays per nostril daily, restart allerclear (loratadine 10mg ) once per day, and Opcon can be used twice per day for eye itching and redness. Make sure to take blood pressure med daily.  I expect the headache to improve in next week. If any worsening headache be seen, including the emergency ro63m if needed.  Plan on physical by telemed in 1 week.  Can discuss the lab work from today at that time as well.  Return to the clinic or go to the nearest emergency room if any of your symptoms worsen or new symptoms occur.  Allergic Rhinitis, Adult Allergic rhinitis is an allergic reaction that affects the mucous membrane inside the nose. It causes sneezing, a runny or stuffy nose, and the feeling of mucus going down the back of the throat (postnasal drip). Allergic rhinitis can be mild to severe. There are two types of allergic rhinitis:  Seasonal. This type is also called hay fever. It happens only during certain seasons.  Perennial. This type can happen at any time of the year. What are the causes? This condition happens when the body's defense system (immune system) responds to certain harmless substances called allergens as though they were germs.  Seasonal allergic rhinitis is triggered by pollen, which can come from grasses, trees, and weeds. Perennial allergic rhinitis may be caused by:  House dust mites.  Pet dander.  Mold spores. What are the signs or symptoms? Symptoms of this condition include:  Sneezing.  Runny or stuffy nose (nasal congestion).  Postnasal drip.  Itchy nose.  Tearing of the eyes.  Trouble sleeping.  Daytime sleepiness. How is this diagnosed? This condition may be diagnosed based on:  Your medical history.  A physical exam.  Tests to check for related conditions, such as: ? Asthma. ? Pink eye. ? Ear infection. ? Upper respiratory  infection.  Tests to find out which allergens trigger your symptoms. These may include skin or blood tests. How is this treated? There is no cure for this condition, but treatment can help control symptoms. Treatment may include:  Taking medicines that block allergy symptoms, such as antihistamines. Medicine may be given as a shot, nasal spray, or pill.  Avoiding the allergen.  Desensitization. This treatment involves getting ongoing shots until your body becomes less sensitive to the allergen. This treatment may be done if other treatments do not help.  If taking medicine and avoiding the allergen does not work, new, stronger medicines may be prescribed. Follow these instructions at home:  Find out what you are allergic to. Common allergens include smoke, dust, and pollen.  Avoid the things you are allergic to. These are some things you can do to help avoid allergens: ? Replace carpet with wood, tile, or vinyl flooring. Carpet can trap dander and dust. ? Do not smoke. Do not allow smoking in your home. ? Change your heating and air conditioning filter at least once a month. ? During allergy season:  Keep windows closed as much as possible.  Plan outdoor activities when pollen counts are lowest. This is usually during the evening hours.  When coming indoors, change clothing and shower before sitting on furniture or bedding.  Take over-the-counter and prescription medicines only as told by your health care provider.  Keep all follow-up visits as told by your health care provider. This is important. Contact a health care provider if:  You have  a fever.  You develop a persistent cough.  You make whistling sounds when you breathe (you wheeze).  Your symptoms interfere with your normal daily activities. Get help right away if:  You have shortness of breath. Summary  This condition can be managed by taking medicines as directed and avoiding allergens.  Contact your health  care provider if you develop a persistent cough or fever.  During allergy season, keep windows closed as much as possible. This information is not intended to replace advice given to you by your health care provider. Make sure you discuss any questions you have with your health care provider. Document Released: 05/30/2001 Document Revised: 10/12/2016 Document Reviewed: 10/12/2016 Elsevier Interactive Patient Education  2019 [place] Headache Without Cause A headache is pain or discomfort that is felt around the head or neck area. There are many causes and types of headaches. In some cases, the cause may not be found. Follow these instructions at home: Watch your condition for any changes. Let your doctor know about them. Take these steps to help with your condition: Managing pain      Take over-the-counter and prescription medicines only as told by your doctor.  Lie down in a dark, quiet room when you have a headache.  If told, put ice on your head and neck area: ? Put ice in a plastic bag. ? Place a towel between your skin and the bag. ? Leave the ice on for 20 minutes, 2-3 times per day.  If told, put heat on the affected area. Use the heat source that your doctor recommends, such as a moist heat pack or a heating pad. ? Place a towel between your skin and the heat source. ? Leave the heat on for 20-30 minutes. ? Remove the heat if your skin turns bright red. This is very important if you are unable to feel pain, heat, or cold. You may have a greater risk of getting burned.  Keep lights dim if bright lights bother you or make your headaches worse. Eating and drinking  Eat meals on a regular schedule.  If you drink alcohol: ? Limit how much you use to:  0-1 drink a day for women.  0-2 drinks a day for men. ? Be aware of how much alcohol is in your drink. In the [place] one drink equals one 12 oz bottle of beer (355 mL), one 5 oz glass of wine (148 mL), or one 1  oz glass of hard liquor (44 mL).  Stop drinking caffeine, or reduce how much caffeine you drink. General instructions   Keep a journal to find out if certain things bring on headaches. For example, write down: ? What you eat and drink. ? How much sleep you get. ? Any change to your diet or medicines.  Get a massage or try other ways to relax.  Limit stress.  Sit up straight. Do not tighten (tense) your muscles.  Do not use any products that contain nicotine or tobacco. This includes cigarettes, e-cigarettes, and chewing tobacco. If you need help quitting, ask your doctor.  Exercise regularly as told by your doctor.  Get enough sleep. This often means 7-9 hours of sleep each night.  Keep all follow-up visits as told by your doctor. This is important. Contact a doctor if:  Your symptoms are not helped by medicine.  You have a headache that feels different than the other headaches.  You feel sick to your stomach (nauseous) or you throw  up (vomit).  You have a fever. Get help right away if:  Your headache gets very bad quickly.  Your headache gets worse after a lot of physical activity.  You keep throwing up.  You have a stiff neck.  You have trouble seeing.  You have trouble speaking.  You have pain in the eye or ear.  Your muscles are weak or you lose muscle control.  You lose your balance or have trouble walking.  You feel like you will pass out (faint) or you pass out.  You are mixed up (confused).  You have a seizure. Summary  A headache is pain or discomfort that is felt around the head or neck area.  There are many causes and types of headaches. In some cases, the cause may not be found.  Keep a journal to help find out what causes your headaches. Watch your condition for any changes. Let your doctor know about them.  Contact a doctor if you have a headache that is different from usual, or if your headache is not helped by medicine.  Get help  right away if your headache gets very bad, you throw up, you have trouble seeing, you lose your balance, or you have a seizure. This information is not intended to replace advice given to you by your health care provider. Make sure you discuss any questions you have with your health care provider. Document Released: 06/13/2008 Document Revised: 03/25/2018 Document Reviewed: 03/25/2018 Elsevier Interactive Patient Education  [employer].     If you have lab work done today you will be contacted with your lab results within the next 2 weeks.  If you have not heard from [place] then please contact us. The fastest way to get your results is to register for My Chart.   IF you received an x-ray today, you will receive an invoice from [facility]. Please contact [facility] at [phone] with questions or concerns regarding your invoice.   IF you received labwork today, you will receive an invoice from [facility]. Please contact [facility] at [phone] with questions or concerns regarding your invoice.   Our billing staff will not be able to assist you with questions regarding bills from these companies.  You will be contacted with the lab results as soon as they are available. The fastest way to get your results is to activate your My Chart account. Instructions are located on the last page of this paperwork. If you have not heard from [place] regarding the results in 2 weeks, please contact this office.

## 2018-12-30 NOTE — Progress Notes (Signed)
Virtual Visit via Telephone Note 3:15 PM Called home and mobile numbers, left voicemail that I will try to reach him again but may need to reschedule if I have not heard from him this afternoon.  Repeat call: 3:37 PM  I connected with [name] on 12/30/18 at 3:37 PM  by telephone and verified that I am speaking with the correct person using two identifiers.   I discussed the limitations, risks, security and privacy concerns of performing an evaluation and management service by telephone and the availability of in person appointments. I also discussed with the patient that there may be a patient responsible charge related to this service. The patient expressed understanding and agreed to proceed, consent obtained  CC: annual exam and headache.   History of Present Illness: Initially scheduled for annual exam, but also complaining of headache for the past 2 weeks.  He has a history of multiple medical problems including hypertension, GERD, tobacco use, COPD, prostate cancer, allergic rhinitis.    Headache: HA in front of head past few weeks. 2 weeks. Not worst HA of life. Not severe, just persistent. 4-5/10.  No CP/heart palpitations.  No new dyspnea or cough.  No slurred speech or facial droop. No new focal weakness. Episodic numbness in left upper arm since last year - no new symptoms.  Has had sneezing past week.  No fever.  Eyes watering and itchy. No other change in vision.  No discolored nasal d/c.  No n/v.  flonase - 2spr/nost QD. occasional missed.  dose - 1-2 times per week.  Not using allerclear (generic loratadine) - has not used recently.  Using opcon-A once per day - still itching and watering. Not using optivar.   Home from work due to age. Works at [employer].   Tx: alleve, acetaminophen - not taking daily.   Hypertension: BP Readings from Last 3 Encounters:  12/30/18 128/82  06/10/18 124/86  04/29/18 138/88   Lab Results  Component Value Date   CREATININE 0.95 12/18/2017  missed 3 doses of blood pressure meds past 2 weeks. Reading ok as above.   Patient Active Problem List   Diagnosis Date Noted  . Allergic rhinitis 03/12/2017  . Thrush, oral 02/27/2017  . SUI (stress urinary incontinence), male 03/20/2016  . Prostate cancer ([place]) 02/09/2016  . Obese 09/08/2015  . S/P right TKA 09/06/2015  . S/P knee replacement 09/06/2015  . COPD, mild ([place]) 07/20/2014  . Pulmonary nodules 02/23/2014  . Tobacco use disorder, moderate, in sustained remission 02/23/2014  . Recurrent right inguinal hernia 10/20/2013  . ED (erectile dysfunction) 08/26/2012  . Physical exam, annual 11/05/2011  . HTN (hypertension) 11/05/2011  . GERD (gastroesophageal reflux disease) 11/05/2011  . Chronic joint pain 11/05/2011   Past Medical History:  Diagnosis Date  . Allergy    seasonal  . Arthritis    oa  . Asthma   . COPD (chronic obstructive pulmonary disease) ([place])   . GERD (gastroesophageal reflux disease)   . Hypertension   . Prostate cancer ([place]) 01/2016   prostatectomy   . Shortness of breath dyspnea    Past Surgical History:  Procedure Laterality Date  . HERNIA REPAIR  last done 3-4 yrs ago   x 2  . INGUINAL HERNIA REPAIR Right 11/20/2013   Procedure: OPEN REPAIR OF RECURRENT RIGHT INGUINAL HERNIA WITH MESH;  Surgeon: [name]. [name], MD;  Location: [facility];  Service: General;  Laterality: Right;  . INSERTION OF MESH Right 11/20/2013  Procedure: INSERTION OF MESH;  Surgeon: [name]. [name], MD;  Location: [facility];  Service: General;  Laterality: Right;  . KNEE SURGERY Left july 2014   mcl and meniscus repair  . LYMPHADENECTOMY Bilateral 02/09/2016   Procedure: PELVIC LYMPHADENECTOMY;  Surgeon: [name], MD;  Location: [facility];  Service: Urology;  Laterality: Bilateral;  . right knee arthroscopy     . ROBOT ASSISTED LAPAROSCOPIC RADICAL PROSTATECTOMY N/A 02/09/2016   Procedure: XI ROBOTIC ASSISTED LAPAROSCOPIC RADICAL PROSTATECTOMY WITH  INDOCYANINE GREEN DYE AND ADHESIOLYSIS;  Surgeon: [name], MD;  Location: [facility];  Service: Urology;  Laterality: N/A;  . TOTAL KNEE ARTHROPLASTY Right 09/06/2015   Procedure: RIGHT TOTAL KNEE ARTHROPLASTY;  Surgeon: [name], MD;  Location: [facility];  Service: Orthopedics;  Laterality: Right;   Allergies  Allergen Reactions  . Percocet [Oxycodone-Acetaminophen] Other (See Comments)    Nausea-Doesn't work  . Amoxicillin Rash    Has patient had a PCN reaction causing immediate rash, facial/tongue/throat swelling, SOB or lightheadedness with hypotension: No Has patient had a PCN reaction causing severe rash involving mucus membranes or skin necrosis: No Has patient had a PCN reaction that required hospitalization No Has patient had a PCN reaction occurring within the last 10 years: Yes- 2016 If all of the above answers are "NO", then may proceed with Cephalosporin use.    Prior to Admission medications   Medication Sig Start Date End Date Taking? Authorizing Provider  albuterol (PROVENTIL HFA;VENTOLIN HFA) 108 (90 Base) MCG/ACT inhaler Inhale 2 puffs into the lungs every 6 (six) hours as needed for wheezing or shortness of breath. 03/12/17   Byrum, Rose Fillers, MD  amLODipine (NORVASC) 5 MG tablet Take 1 tablet (5 mg total) by mouth daily. 03/27/18   [name], MD  azelastine (OPTIVAR) 0.05 % ophthalmic solution Place 1 drop into both eyes 2 (two) times daily as needed. For itching/allergy symptoms 04/22/18   [name], MD  clobetasol ointment (TEMOVATE) 0.05 % Apply topically 2 (two) times daily. Apply bid to itchy rash, do not use on face, genitals, axillae Patient taking differently: Apply 1 application topically 2 (two) times daily as needed (Apply to itchy rash, do not use on face, genitals, axillae.).  02/24/13   Orma Flaming, MD  esomeprazole (NEXIUM) 40 MG capsule TAKE 1 CAPSULE BY MOUTH DAILY 11/30/17   [name], MD  fluticasone [facility]) 50 MCG/ACT nasal  spray PLACE 2 SPRAYS INTO BOTH NOSTRILS DAILY AS NEEDED FOR ALLERGIES. 02/21/18   [name], MD  fluticasone (KLS ALLER-FLO) 50 MCG/ACT nasal spray Place 2 sprays into both nostrils daily. 08/19/18   [name], MD  KLS ALLERCLEAR 10 MG tablet TAKE 1 TABLET BY MOUTH DAILY 08/15/17   Collene Gobble, MD  KLS ESOMEPRAZOLE MAGNESIUM 20 MG capsule TAKE TWO CAPSULES BY MOUTH DAILY  12/09/18   [name], MD  meloxicam (MOBIC) 7.5 MG tablet TAKE ONE TABLET BY MOUTH DAILY AS NEEDED  09/02/18   [name], MD  Naphazoline-Pheniramine (OPCON-A) 0.027-0.315 % SOLN Apply 1 drop to eye 2 (two) times daily as needed (dry/irritated eyes).    [provider]  omeprazole (PRILOSEC) 20 MG capsule Take 1-2 capsules (20-40 mg total) by mouth daily as needed. 03/27/18   [name], MD  traMADol (ULTRAM) 50 MG tablet Take 1 tablet (50 mg total) by mouth every 6 (six) hours as needed. 12/18/17   [name], MD  triamcinolone cream (KENALOG)  0.1 % Apply 1 application topically 2 (two) times daily as needed. To affected areas only as needed. 08/07/16   [name], MD  umeclidinium bromide (INCRUSE ELLIPTA) 62.5 MCG/INH AEPB Inhale 1 puff into the lungs daily. 04/29/18   Collene Gobble, MD   Social History   Socioeconomic History  . Marital status: Married    Spouse name: Not on file  . Number of children: Not on file  . Years of education: Not on file  . Highest education level: Not on file  Occupational History  . Not on file  Social Needs  . Financial resource strain: Not on file  . Food insecurity:    Worry: Not on file    Inability: Not on file  . Transportation needs:    Medical: Not on file    Non-medical: Not on file  Tobacco Use  . Smoking status: Former Smoker    Packs/day: 1.50    Years: 20.00    Pack years: 30.00    Types: Cigarettes    Last attempt to quit: 11/05/1999    Years since quitting: 19.1  . Smokeless tobacco: Never Used  Substance  and Sexual Activity  . Alcohol use: No    Comment: last used 25 years ago   . Drug use: Yes    Types: Cocaine    Comment: last used cocaine  and acid 25 yrs ago,none used since  . Sexual activity: Not on file  Lifestyle  . Physical activity:    Days per week: Not on file    Minutes per session: Not on file  . Stress: Not on file  Relationships  . Social connections:    Talks on phone: Not on file    Gets together: Not on file    Attends religious service: Not on file    Active member of club or organization: Not on file    Attends meetings of clubs or organizations: Not on file    Relationship status: Not on file  . Intimate partner violence:    Fear of current or ex partner: Not on file    Emotionally abused: Not on file    Physically abused: Not on file    Forced sexual activity: Not on file  Other Topics Concern  . Not on file  Social History Narrative  . Not on file     Observations/Objective: Vitals:   12/30/18 1417  BP: 128/82  Pulse: (!) 106  Resp: 16  Temp: 98.8 F (37.1 C)  TempSrc: Oral  SpO2: 99%  Weight: 215 lb 12.8 oz (97.9 kg)     Assessment and Plan: Essential hypertension - Plan: Lipid panel, TSH, CMP14+EGFR  -Stable on in office tests, labs pending as above.  No changes.  Plan on physical in 1 week for other health maintenance review  Screening for malignant neoplasm of prostate - Plan: PSA  -PSA obtained as part of physical, will discuss further at health maintenance review at time of planned physical.  Nonintractable headache, unspecified chronicity pattern, unspecified headache type  -Suspected headache from allergies, no red flags on history.  Not worse headache of life.  -Continue Flonase nasal spray, restart AllerClear/loratadine, and Opcon drops can be used twice per day for eye symptoms.  -Adherence to BP meds discussed, but in office reading at time of lab work looked okay.  Unlikely cause of headache.  RTC/ER precautions if not  improving or worsening symptoms    Follow Up Instructions:  Patient Instructions   Based on your current symptoms, I suspect the headache may be related to allergies. Continue flonase nasal spray 2 sprays per nostril daily, restart allerclear (loratadine 9m) once per day, and Opcon can be used twice per day for eye itching and redness. Make sure to take blood pressure med daily.  I expect the headache to improve in next week. If any worsening headache be seen, including the emergency [id number] needed.  Plan on physical by telemed in 1 week.  Can discuss the lab work from today at that time as well.  Return to the clinic or go to the nearest emergency room if any of your symptoms worsen or new symptoms occur.  Allergic Rhinitis, Adult Allergic rhinitis is an allergic reaction that affects the mucous membrane inside the nose. It causes sneezing, a runny or stuffy nose, and the feeling of mucus going down the back of the throat (postnasal drip). Allergic rhinitis can be mild to severe. There are two types of allergic rhinitis:  Seasonal. This type is also called hay fever. It happens only during certain seasons.  Perennial. This type can happen at any time of the year. What are the causes? This condition happens when the body's defense system (immune system) responds to certain harmless substances called allergens as though they were germs.  Seasonal allergic rhinitis is triggered by pollen, which can come from grasses, trees, and weeds. Perennial allergic rhinitis may be caused by:  House dust mites.  Pet dander.  Mold spores. What are the signs or symptoms? Symptoms of this condition include:  Sneezing.  Runny or stuffy nose (nasal congestion).  Postnasal drip.  Itchy nose.  Tearing of the eyes.  Trouble sleeping.  Daytime sleepiness. How is this diagnosed? This condition may be diagnosed based on:  Your medical history.  A physical exam.  Tests to check for  related conditions, such as: ? Asthma. ? Pink eye. ? Ear infection. ? Upper respiratory infection.  Tests to find out which allergens trigger your symptoms. These may include skin or blood tests. How is this treated? There is no cure for this condition, but treatment can help control symptoms. Treatment may include:  Taking medicines that block allergy symptoms, such as antihistamines. Medicine may be given as a shot, nasal spray, or pill.  Avoiding the allergen.  Desensitization. This treatment involves getting ongoing shots until your body becomes less sensitive to the allergen. This treatment may be done if other treatments do not help.  If taking medicine and avoiding the allergen does not work, new, stronger medicines may be prescribed. Follow these instructions at home:  Find out what you are allergic to. Common allergens include smoke, dust, and pollen.  Avoid the things you are allergic to. These are some things you can do to help avoid allergens: ? Replace carpet with wood, tile, or vinyl flooring. Carpet can trap dander and dust. ? Do not smoke. Do not allow smoking in your home. ? Change your heating and air conditioning filter at least once a month. ? During allergy season:  Keep windows closed as much as possible.  Plan outdoor activities when pollen counts are lowest. This is usually during the evening hours.  When coming indoors, change clothing and shower before sitting on furniture or bedding.  Take over-the-counter and prescription medicines only as told by your health care provider.  Keep all follow-up visits as told by your health care provider. This is important. Contact a health care provider  if:  You have a fever.  You develop a persistent cough.  You make whistling sounds when you breathe (you wheeze).  Your symptoms interfere with your normal daily activities. Get help right away if:  You have shortness of breath. Summary  This condition can  be managed by taking medicines as directed and avoiding allergens.  Contact your health care provider if you develop a persistent cough or fever.  During allergy season, keep windows closed as much as possible. This information is not intended to replace advice given to you by your health care provider. Make sure you discuss any questions you have with your health care provider. Document Released: 05/30/2001 Document Revised: 10/12/2016 Document Reviewed: 10/12/2016 Elsevier Interactive Patient Education  2019 [place] Headache Without Cause A headache is pain or discomfort that is felt around the head or neck area. There are many causes and types of headaches. In some cases, the cause may not be found. Follow these instructions at home: Watch your condition for any changes. Let your doctor know about them. Take these steps to help with your condition: Managing pain      Take over-the-counter and prescription medicines only as told by your doctor.  Lie down in a dark, quiet room when you have a headache.  If told, put ice on your head and neck area: ? Put ice in a plastic bag. ? Place a towel between your skin and the bag. ? Leave the ice on for 20 minutes, 2-3 times per day.  If told, put heat on the affected area. Use the heat source that your doctor recommends, such as a moist heat pack or a heating pad. ? Place a towel between your skin and the heat source. ? Leave the heat on for 20-30 minutes. ? Remove the heat if your skin turns bright red. This is very important if you are unable to feel pain, heat, or cold. You may have a greater risk of getting burned.  Keep lights dim if bright lights bother you or make your headaches worse. Eating and drinking  Eat meals on a regular schedule.  If you drink alcohol: ? Limit how much you use to:  0-1 drink a day for women.  0-2 drinks a day for men. ? Be aware of how much alcohol is in your drink. In the [place] one  drink equals one 12 oz bottle of beer (355 mL), one 5 oz glass of wine (148 mL), or one 1 oz glass of hard liquor (44 mL).  Stop drinking caffeine, or reduce how much caffeine you drink. General instructions   Keep a journal to find out if certain things bring on headaches. For example, write down: ? What you eat and drink. ? How much sleep you get. ? Any change to your diet or medicines.  Get a massage or try other ways to relax.  Limit stress.  Sit up straight. Do not tighten (tense) your muscles.  Do not use any products that contain nicotine or tobacco. This includes cigarettes, e-cigarettes, and chewing tobacco. If you need help quitting, ask your doctor.  Exercise regularly as told by your doctor.  Get enough sleep. This often means 7-9 hours of sleep each night.  Keep all follow-up visits as told by your doctor. This is important. Contact a doctor if:  Your symptoms are not helped by medicine.  You have a headache that feels different than the other headaches.  You feel sick to your stomach (  nauseous) or you throw up (vomit).  You have a fever. Get help right away if:  Your headache gets very bad quickly.  Your headache gets worse after a lot of physical activity.  You keep throwing up.  You have a stiff neck.  You have trouble seeing.  You have trouble speaking.  You have pain in the eye or ear.  Your muscles are weak or you lose muscle control.  You lose your balance or have trouble walking.  You feel like you will pass out (faint) or you pass out.  You are mixed up (confused).  You have a seizure. Summary  A headache is pain or discomfort that is felt around the head or neck area.  There are many causes and types of headaches. In some cases, the cause may not be found.  Keep a journal to help find out what causes your headaches. Watch your condition for any changes. Let your doctor know about them.  Contact a doctor if you have a headache  that is different from usual, or if your headache is not helped by medicine.  Get help right away if your headache gets very bad, you throw up, you have trouble seeing, you lose your balance, or you have a seizure. This information is not intended to replace advice given to you by your health care provider. Make sure you discuss any questions you have with your health care provider. Document Released: 06/13/2008 Document Revised: 03/25/2018 Document Reviewed: 03/25/2018 Elsevier Interactive Patient Education  [employer].     If you have lab work done today you will be contacted with your lab results within the next 2 weeks.  If you have not heard from [place] then please contact us. The fastest way to get your results is to register for My Chart.   IF you received an x-ray today, you will receive an invoice from [facility]. Please contact [facility] at [phone] with questions or concerns regarding your invoice.   IF you received labwork today, you will receive an invoice from [facility]. Please contact [facility] at [phone] with questions or concerns regarding your invoice.   Our billing staff will not be able to assist you with questions regarding bills from these companies.  You will be contacted with the lab results as soon as they are available. The fastest way to get your results is to activate your My Chart account. Instructions are located on the last page of this paperwork. If you have not heard from [place] regarding the results in 2 weeks, please contact this office.        I discussed the assessment and treatment plan with the patient. The patient was provided an opportunity to ask questions and all were answered. The patient agreed with the plan and demonstrated an understanding of the instructions.   The patient was advised to call back or seek an in-person evaluation if the symptoms worsen or if the condition fails to improve as anticipated.  I  provided 20 minutes of non-face-to-face time during this encounter.  Signed,   [name], MD [facility] [facility].  12/30/18

## 2018-12-31 LAB — LIPID PANEL
Chol/HDL Ratio: 5.1 ratio — ABNORMAL HIGH (ref 0.0–5.0)
Cholesterol, Total: 199 mg/dL (ref 100–199)
HDL: 39 mg/dL — ABNORMAL LOW (ref >39)
LDL Calculated: 131 mg/dL — ABNORMAL HIGH (ref 0–99)
Triglycerides: 143 mg/dL (ref 0–149)
VLDL Cholesterol Cal: 29 mg/dL (ref 5–40)

## 2018-12-31 LAB — CMP14+EGFR
ALT: 38 IU/L (ref 0–44)
AST: 21 IU/L (ref 0–40)
Albumin/Globulin Ratio: 2.1 (ref 1.2–2.2)
Albumin: 4.6 g/dL (ref 3.8–4.8)
Alkaline Phosphatase: 109 IU/L (ref 39–117)
BUN/Creatinine Ratio: 14 (ref 10–24)
BUN: 12 mg/dL (ref 8–27)
Bilirubin Total: 0.7 mg/dL (ref 0.0–1.2)
CO2: 20 mmol/L (ref 20–29)
Calcium: 10 mg/dL (ref 8.6–10.2)
Chloride: 102 mmol/L (ref 96–106)
Creatinine, Ser: 0.87 mg/dL (ref 0.76–1.27)
GFR calc Af Amer: 103 mL/min/{1.73_m2} (ref >59)
GFR calc non Af Amer: 89 mL/min/{1.73_m2} (ref >59)
Globulin, Total: 2.2 g/dL (ref 1.5–4.5)
Glucose: 84 mg/dL (ref 65–99)
Potassium: 4.2 mmol/L (ref 3.5–5.2)
Sodium: 140 mmol/L (ref 134–144)
Total Protein: 6.8 g/dL (ref 6.0–8.5)

## 2018-12-31 LAB — TSH: TSH: 1.29 u[IU]/mL (ref 0.450–4.500)

## 2018-12-31 LAB — PSA: Prostate Specific Ag, Serum: <0.1 ng/mL (ref 0.0–4.0)

## 2019-01-08 ENCOUNTER — Other Ambulatory Visit: Payer: Self-pay

## 2019-01-08 ENCOUNTER — Telehealth (INDEPENDENT_AMBULATORY_CARE_PROVIDER_SITE_OTHER): Payer: 59 | Admitting: Family Medicine

## 2019-01-08 DIAGNOSIS — K219 Gastro-esophageal reflux disease without esophagitis: Secondary | ICD-10-CM

## 2019-01-08 DIAGNOSIS — L299 Pruritus, unspecified: Secondary | ICD-10-CM | POA: Diagnosis not present

## 2019-01-08 DIAGNOSIS — Z0001 Encounter for general adult medical examination with abnormal findings: Secondary | ICD-10-CM | POA: Diagnosis not present

## 2019-01-08 DIAGNOSIS — Z0000 Encounter for general adult medical examination without abnormal findings: Secondary | ICD-10-CM

## 2019-01-08 DIAGNOSIS — I10 Essential (primary) hypertension: Secondary | ICD-10-CM

## 2019-01-08 DIAGNOSIS — G4489 Other headache syndrome: Secondary | ICD-10-CM | POA: Diagnosis not present

## 2019-01-08 DIAGNOSIS — E785 Hyperlipidemia, unspecified: Secondary | ICD-10-CM | POA: Diagnosis not present

## 2019-01-08 DIAGNOSIS — R21 Rash and other nonspecific skin eruption: Secondary | ICD-10-CM

## 2019-01-08 DIAGNOSIS — J449 Chronic obstructive pulmonary disease, unspecified: Secondary | ICD-10-CM

## 2019-01-08 DIAGNOSIS — Z1211 Encounter for screening for malignant neoplasm of colon: Secondary | ICD-10-CM

## 2019-01-08 MED ORDER — TRIAMCINOLONE ACETONIDE 0.1 % EX CREA: 1.0000 "application " | TOPICAL_CREAM | Freq: Two times a day (BID) | CUTANEOUS | 0 refills | Status: DC | PRN

## 2019-01-08 MED ORDER — ATORVASTATIN CALCIUM 10 MG PO TABS: 10.0000 mg | ORAL_TABLET | Freq: Every day | ORAL | 1 refills | Status: DC

## 2019-01-08 NOTE — Progress Notes (Signed)
Virtual Visit via Telephone Note  I connected with [name] on 01/08/19 at 10:29 AM by telephone and verified that I am speaking with the correct person using two identifiers.   I discussed the limitations, risks, security and privacy concerns of performing an evaluation and management service by telephone and the availability of in person appointments. I also discussed with the patient that there may be a patient responsible charge related to this service. The patient expressed understanding and agreed to proceed, consent obtained  Chief complaint: Wellness exam, follow up HA.   History of Present Illness: [name] is a 68 y.o. male Plan for wellness exam, initially evaluated April 13, but due to symptoms at that time, was treated for acute issues.  Headache: See last office visit, nonfocal neurologic symptoms.  Suspected allergy component.  Continue to Flonase nasal spray, advised to restart loratadine and increased frequency of the Opcon eyedrops. HA is getting better with allergy treatment.   Hypertension: BP Readings from Last 3 Encounters:  12/30/18 128/82  06/10/18 124/86  04/29/18 138/88   Lab Results  Component Value Date   CREATININE 0.87 12/30/2018  Takes Norvasc 5 mg daily, reading controlled at last visit when obtaining labs.  Denies any new side effects of meds.  No home readings.  Constitutional: Negative for fatigue and unexpected weight change.  Eyes: Negative for visual disturbance.  Respiratory: Negative for cough, chest tightness and shortness of breath.   Cardiovascular: Negative for chest pain, palpitations and leg swelling.  Gastrointestinal: Negative for abdominal pain and blood in stool.  Neurological: Negative for dizziness, light-headedness and headaches.   Rash: Spot on right ankle and private area for for few weeks. Slight itching. No penile discharge, no abd pain. Small bump on head of penis. No hx of genital HSV or other STI. No  new sexual contacts. No spread of rash to other areas - just 2 bumps. No known poison ivy.  Tx: steroid cream form few years ago - once per day. Helps itching some.     Hyperlipidemia:  Lab Results  Component Value Date   CHOL 199 12/30/2018   HDL 39 (L) 12/30/2018   LDLCALC 131 (H) 12/30/2018   TRIG 143 12/30/2018   CHOLHDL 5.1 (H) 12/30/2018   Lab Results  Component Value Date   ALT 38 12/30/2018   AST 21 12/30/2018   ALKPHOS 109 12/30/2018   BILITOT 0.7 12/30/2018  Not currently on statin.  The 10-year ASCVD risk score [name] DC [name]., et al., 2013) is: 18.4%   Values used to calculate the score:     Age: 52 years     Sex: Male     Is Non-Hispanic African American: Yes     Diabetic: No     Tobacco smoker: No     Systolic Blood Pressure: 619 mmHg     Is BP treated: Yes     HDL Cholesterol: 39 mg/dL     Total Cholesterol: 199 mg/dL   COPD: Former smoker.  Pulmonary Dr. [name].  Uses Incruse Ellipta 1 puff daily.  Has albuterol as needed.  GERD Has taken  esomeprazole 20mg  QD. Controlling heartburn.  No blood in stool, no hx of PUD.   Cancer screening: Colonoscopy: last had in around 2007 or 2008. Normal.    Planned last year "chickened out" Plans on postponing until after current pandemic. Option of Cologuard, but would like to proceed with colonoscopy.  Prostate cancer testing: PSA less than 0.1 on  testing April 13  Immunization History  Administered Date(s) Administered  . Influenza Split 06/18/2013  . Influenza,inj,Quad PF,6+ Mos 08/03/2015  . Influenza-Unspecified 07/13/2014, 07/02/2016  . Pneumococcal Conjugate-13 06/10/2018  Plan for Pneumovax later this year.  Prevnar September 2019. Shingles vaccine: not sure if had - maybe done at [facility].   Functional Status Survey:  Reviewed without concerns  6CIT Screen 12/30/2018  What Year? 0 points  What month? 0 points  What time? 0 points  Count back from 20 0 points  Months in reverse 0 points  Repeat phrase  0 points  Total Score 0   Vision: due for repeat exam. Last appt about 1.5 yrs ago.   Dental: saw 68months ago.   Exercise: walking, squats at times. 51min qd, daily.   Alcohol use: none.  No tobacco.   Advanced directives: No advanced directive, information to be provided.       Patient Active Problem List   Diagnosis Date Noted  . Allergic rhinitis 03/12/2017  . Thrush, oral 02/27/2017  . SUI (stress urinary incontinence), male 03/20/2016  . Prostate cancer ([place]) 02/09/2016  . Obese 09/08/2015  . S/P right TKA 09/06/2015  . S/P knee replacement 09/06/2015  . COPD, mild ([place]) 07/20/2014  . Pulmonary nodules 02/23/2014  . Tobacco use disorder, moderate, in sustained remission 02/23/2014  . Recurrent right inguinal hernia 10/20/2013  . ED (erectile dysfunction) 08/26/2012  . Physical exam, annual 11/05/2011  . HTN (hypertension) 11/05/2011  . GERD (gastroesophageal reflux disease) 11/05/2011  . Chronic joint pain 11/05/2011   Past Medical History:  Diagnosis Date  . Allergy    seasonal  . Arthritis    oa  . Asthma   . COPD (chronic obstructive pulmonary disease) ([place])   . GERD (gastroesophageal reflux disease)   . Hypertension   . Prostate cancer ([place]) 01/2016   prostatectomy   . Shortness of breath dyspnea    Past Surgical History:  Procedure Laterality Date  . HERNIA REPAIR  last done 3-4 yrs ago   x 2  . INGUINAL HERNIA REPAIR Right 11/20/2013   Procedure: OPEN REPAIR OF RECURRENT RIGHT INGUINAL HERNIA WITH MESH;  Surgeon: [name]. [name], MD;  Location: [facility];  Service: General;  Laterality: Right;  . INSERTION OF MESH Right 11/20/2013   Procedure: INSERTION OF MESH;  Surgeon: [name]. [name], MD;  Location: [facility];  Service: General;  Laterality: Right;  . KNEE SURGERY Left july 2014   mcl and meniscus repair  . LYMPHADENECTOMY Bilateral 02/09/2016   Procedure: PELVIC LYMPHADENECTOMY;  Surgeon: [name], MD;  Location: [facility];  Service: Urology;   Laterality: Bilateral;  . right knee arthroscopy     . ROBOT ASSISTED LAPAROSCOPIC RADICAL PROSTATECTOMY N/A 02/09/2016   Procedure: XI ROBOTIC ASSISTED LAPAROSCOPIC RADICAL PROSTATECTOMY WITH INDOCYANINE GREEN DYE AND ADHESIOLYSIS;  Surgeon: [name], MD;  Location: [facility];  Service: Urology;  Laterality: N/A;  . TOTAL KNEE ARTHROPLASTY Right 09/06/2015   Procedure: RIGHT TOTAL KNEE ARTHROPLASTY;  Surgeon: [name], MD;  Location: [facility];  Service: Orthopedics;  Laterality: Right;   Allergies  Allergen Reactions  . Percocet [Oxycodone-Acetaminophen] Other (See Comments)    Nausea-Doesn't work  . Amoxicillin Rash    Has patient had a PCN reaction causing immediate rash, facial/tongue/throat swelling, SOB or lightheadedness with hypotension: No Has patient had a PCN reaction causing severe rash involving mucus membranes or skin necrosis: No Has patient had a PCN reaction that required hospitalization No Has patient  had a PCN reaction occurring within the last 10 years: Yes- 2016 If all of the above answers are "NO", then may proceed with Cephalosporin use.    Prior to Admission medications   Medication Sig Start Date End Date Taking? Authorizing Provider  albuterol (PROVENTIL HFA;VENTOLIN HFA) 108 (90 Base) MCG/ACT inhaler Inhale 2 puffs into the lungs every 6 (six) hours as needed for wheezing or shortness of breath. 03/12/17  Yes Byrum, Rose Fillers, MD  amLODipine (NORVASC) 5 MG tablet Take 1 tablet (5 mg total) by mouth daily. 03/27/18  Yes [name], MD  azelastine (OPTIVAR) 0.05 % ophthalmic solution Place 1 drop into both eyes 2 (two) times daily as needed. For itching/allergy symptoms 04/22/18  Yes [name], MD  clobetasol ointment (TEMOVATE) 0.05 % Apply topically 2 (two) times daily. Apply bid to itchy rash, do not use on face, genitals, axillae Patient taking differently: Apply 1 application topically 2 (two) times daily as needed (Apply to itchy rash, do not use  on face, genitals, axillae.).  02/24/13  Yes GuestBenn Moulder, MD  esomeprazole (NEXIUM) 40 MG capsule TAKE 1 CAPSULE BY MOUTH DAILY 11/30/17  Yes [name], MD  fluticasone [facility]) 50 MCG/ACT nasal spray PLACE 2 SPRAYS INTO BOTH NOSTRILS DAILY AS NEEDED FOR ALLERGIES. 02/21/18  Yes [name], MD  fluticasone (KLS ALLER-FLO) 50 MCG/ACT nasal spray Place 2 sprays into both nostrils daily. 08/19/18  Yes [name], MD  KLS ALLERCLEAR 10 MG tablet TAKE 1 TABLET BY MOUTH DAILY 08/15/17  Yes Collene Gobble, MD  KLS ESOMEPRAZOLE MAGNESIUM 20 MG capsule TAKE TWO CAPSULES BY MOUTH DAILY  12/09/18  Yes [name], MD  meloxicam (MOBIC) 7.5 MG tablet TAKE ONE TABLET BY MOUTH DAILY AS NEEDED  09/02/18  Yes [name], MD  Naphazoline-Pheniramine (OPCON-A) 0.027-0.315 % SOLN Apply 1 drop to eye 2 (two) times daily as needed (dry/irritated eyes).   Yes [provider]  omeprazole (PRILOSEC) 20 MG capsule Take 1-2 capsules (20-40 mg total) by mouth daily as needed. 03/27/18  Yes [name], MD  traMADol (ULTRAM) 50 MG tablet Take 1 tablet (50 mg total) by mouth every 6 (six) hours as needed. 12/18/17  Yes [name], MD  triamcinolone cream (KENALOG) 0.1 % Apply 1 application topically 2 (two) times daily as needed. To affected areas only as needed. 08/07/16  Yes [name], MD  umeclidinium bromide (INCRUSE ELLIPTA) 62.5 MCG/INH AEPB Inhale 1 puff into the lungs daily. 04/29/18  Yes Collene Gobble, MD   Social History   Socioeconomic History  . Marital status: Married    Spouse name: Not on file  . Number of children: Not on file  . Years of education: Not on file  . Highest education level: Not on file  Occupational History  . Not on file  Social Needs  . Financial resource strain: Not on file  . Food insecurity:    Worry: Not on file    Inability: Not on file  . Transportation needs:    Medical: Not on file    Non-medical: Not on file   Tobacco Use  . Smoking status: Former Smoker    Packs/day: 1.50    Years: 20.00    Pack years: 30.00    Types: Cigarettes    Last attempt to quit: 11/05/1999    Years since quitting: 19.1  . Smokeless tobacco: Never Used  Substance and Sexual Activity  .  Alcohol use: No    Comment: last used 25 years ago   . Drug use: Yes    Types: Cocaine    Comment: last used cocaine  and acid 25 yrs ago,none used since  . Sexual activity: Not on file  Lifestyle  . Physical activity:    Days per week: Not on file    Minutes per session: Not on file  . Stress: Not on file  Relationships  . Social connections:    Talks on phone: Not on file    Gets together: Not on file    Attends religious service: Not on file    Active member of club or organization: Not on file    Attends meetings of clubs or organizations: Not on file    Relationship status: Not on file  . Intimate partner violence:    Fear of current or ex partner: Not on file    Emotionally abused: Not on file    Physically abused: Not on file    Forced sexual activity: Not on file  Other Topics Concern  . Not on file  Social History Narrative  . Not on file     Observations/Objective: No distress on phone, appropriate responses.  Assessment and Plan: Medicare annual wellness visit, subsequent  - - anticipatory guidance as below in AVS, screening labs if needed. Health maintenance items as above in HPI discussed/recommended as applicable.  - no concerning responses on depression, fall, or functional status screening. Any positive responses noted as above. Advanced directives discussed as in [facility].    Pruritus - Plan: triamcinolone cream (KENALOG) 0.1 % Rash and nonspecific skin eruption - Plan: triamcinolone cream (KENALOG) 0.1 %  -Based on description, possible contact dermatitis.  Symptoms stable over past week, no spreading rash.  2 isolated areas without other systemic involvement.  -Short-term trial of triamcinolone  topical twice daily as needed, potential side effects and risk discussed including to genital area, shortest course necessary.  -If rash not improving over the course of the next week, or any spreading advised in person or at minimum video appointment.  Hyperlipidemia, unspecified hyperlipidemia type - Plan: atorvastatin (LIPITOR) 10 MG tablet, Lipid panel, Comprehensive metabolic panel  -Option of trial of diet changes or statin discussed, he would like to try a statin potential side effects and risks discussed.  Lipitor 10 mg daily, plan on repeat lab testing in 6 weeks  Special screening for malignant neoplasms, colon - Plan: Ambulatory referral to Gastroenterology  -Options for colon cancer screening discussed including Cologuard or colonoscopy.  Proceeding with colonoscopy at his wishes.  Referral placed  Essential hypertension  - Stable, tolerating current regimen. Medications refilled. Labs stable   Gastroesophageal reflux disease, esophagitis presence not specified  -  Stable, tolerating current regimen. Medications refilled.    Chronic obstructive pulmonary disease, unspecified COPD type ([place])  - stable.  Continue routine follow-up with pulmonologist, no change in regimen at this time  Other headache syndrome  -Headache has improved.  Likely was related to his allergies that are now improving with current regimen.  No changes but RTC precautions given  Follow Up Instructions: 1 week for rash on legs or genitals if not improving, sooner if worse. 6-week follow-up for lab only visit.,  6 months appointment   I discussed the assessment and treatment plan with the patient. The patient was provided an opportunity to ask questions and all were answered. The patient agreed with the plan and demonstrated an understanding of the instructions.  The patient was advised to call back or seek an in-person evaluation if the symptoms worsen or if the condition fails to improve as anticipated.   I provided 20 minutes of non-face-to-face time during this encounter.  Signed,   [name], MD [facility] [place].  01/08/19

## 2019-01-08 NOTE — Patient Instructions (Addendum)
For the rash in the genital area and on your ankle it is okay to temporarily apply the steroid cream up to twice per day as needed.  If not improving in next week or any spread of rash call back for video visit or possible in person appointment to look at those areas.   If headache not continuing to improve - please schedule another visit.   Return to the clinic or go to the nearest emergency room if any of your symptoms worsen or new symptoms occur.  Start lipitor once per day for cholesterol and recheck levels in 6 weeks.   Preventive Care 68 Years and Older, Male Preventive care refers to lifestyle choices and visits with your health care provider that can promote health and wellness. What does preventive care include?   A yearly physical exam. This is also called an annual well check.  Dental exams once or twice a year.  Routine eye exams. Ask your health care provider how often you should have your eyes checked.  Personal lifestyle choices, including: ? Daily care of your teeth and gums. ? Regular physical activity. ? Eating a healthy diet. ? Avoiding tobacco and drug use. ? Limiting alcohol use. ? Practicing safe sex. ? Taking low doses of aspirin every day. ? Taking vitamin and mineral supplements as recommended by your health care provider. What happens during an annual well check? The services and screenings done by your health care provider during your annual well check will depend on your age, overall health, lifestyle risk factors, and family history of disease. Counseling Your health care provider may ask you questions about your:  Alcohol use.  Tobacco use.  Drug use.  Emotional well-being.  Home and relationship well-being.  Sexual activity.  Eating habits.  History of falls.  Memory and ability to understand (cognition).  Work and work Statistician. Screening You may have the following tests or measurements:  Height, weight, and BMI.  Blood  pressure.  Lipid and cholesterol levels. These may be checked every 5 years, or more frequently if you are over 68 years old.  Skin check.  Lung cancer screening. You may have this screening every year starting at age 75 if you have a 30-pack-year history of smoking and currently smoke or have quit within the past 15 years.  Colorectal cancer screening. All adults should have this screening starting at age 9 and continuing until age 42. You will have tests every 1-10 years, depending on your results and the type of screening test. People at increased risk should start screening at an earlier age. Screening tests may include: ? Guaiac-based fecal occult blood testing. ? Fecal immunochemical test (FIT). ? Stool DNA test. ? Virtual colonoscopy. ? Sigmoidoscopy. During this test, a flexible tube with a tiny camera (sigmoidoscope) is used to examine your rectum and lower colon. The sigmoidoscope is inserted through your anus into your rectum and lower colon. ? Colonoscopy. During this test, a long, thin, flexible tube with a tiny camera (colonoscope) is used to examine your entire colon and rectum.  Prostate cancer screening. Recommendations will vary depending on your family history and other risks.  Hepatitis C blood test.  Hepatitis B blood test.  Sexually transmitted disease (STD) testing.  Diabetes screening. This is done by checking your blood sugar (glucose) after you have not eaten for a while (fasting). You may have this done every 1-3 years.  Abdominal aortic aneurysm (AAA) screening. You may need this if you are a current  or former smoker.  Osteoporosis. You may be screened starting at age 61 if you are at high risk. Talk with your health care provider about your test results, treatment options, and if necessary, the need for more tests. Vaccines Your health care provider may recommend certain vaccines, such as:  Influenza vaccine. This is recommended every year.  Tetanus,  diphtheria, and acellular pertussis (Tdap, Td) vaccine. You may need a Td booster every 10 years.  Varicella vaccine. You may need this if you have not been vaccinated.  Zoster vaccine. You may need this after age 23.  Measles, mumps, and rubella (MMR) vaccine. You may need at least one dose of MMR if you were born in 1957 or later. You may also need a second dose.  Pneumococcal 13-valent conjugate (PCV13) vaccine. One dose is recommended after age 56.  Pneumococcal polysaccharide (PPSV23) vaccine. One dose is recommended after age 41.  Meningococcal vaccine. You may need this if you have certain conditions.  Hepatitis A vaccine. You may need this if you have certain conditions or if you travel or work in places where you may be exposed to hepatitis A.  Hepatitis B vaccine. You may need this if you have certain conditions or if you travel or work in places where you may be exposed to hepatitis B.  Haemophilus influenzae type b (Hib) vaccine. You may need this if you have certain risk factors. Talk to your health care provider about which screenings and vaccines you need and how often you need them. This information is not intended to replace advice given to you by your health care provider. Make sure you discuss any questions you have with your health care provider. Document Released: 10/01/2015 Document Revised: 10/25/2017 Document Reviewed: 07/06/2015 Elsevier Interactive Patient Education  2019 Else[employer] Inc.   Rash, Adult A rash is a change in the color of your skin. A rash can also change the way your skin feels. There are many different conditions and factors that can cause a rash. Some rashes may disappear after a few days, but some may last for a few weeks. Common causes of rashes include:  Viral infections, such as: ? Colds. ? Measles. ? Hand, foot, and mouth disease.  Bacterial infections, such as: ? Scarlet fever. ? Impetigo.  Fungal infections, such as  Candida.  Allergic reactions to food, medicines, or skin care products. Follow these instructions at home: The goal of treatment is to stop the itching and keep the rash from spreading. Pay attention to any changes in your symptoms. Follow these instructions to help with your condition: Medicine Take or apply over-the-counter and prescription medicines only as told by your health care provider. These may include:  Corticosteroid creams to treat red or swollen skin.  Anti-itch lotions.  Oral allergy medicines (antihistamines).  Oral corticosteroids for severe symptoms.  Skin care  Apply cool compresses to the affected areas.  Do not scratch or rub your skin.  Avoid covering the rash. Make sure the rash is exposed to air as much as possible. Managing itching and discomfort  Avoid hot showers or baths, which can make itching worse. A cold shower may help.  Try taking a bath with: ? Epsom salts. Follow manufacturer instructions on the packaging. You can get these at your local pharmacy or grocery store. ? Baking soda. Pour a small amount into the bath as told by your health care provider. ? Colloidal oatmeal. Follow manufacturer instructions on the packaging. You can get this at your  local pharmacy or grocery store.  Try applying baking soda paste to your skin. Stir water into baking soda until it reaches a paste-like consistency.  Try applying calamine lotion. This is an over-the-counter lotion that helps to relieve itchiness.  Keep cool and out of the sun. Sweating and being hot can make itching worse. General instructions   Rest as needed.  Drink enough fluid to keep your urine pale yellow.  Wear loose-fitting clothing.  Avoid scented soaps, detergents, and perfumes. Use gentle soaps, detergents, perfumes, and other cosmetic products.  Avoid any substance that causes your rash. Keep a journal to help track what causes your rash. Write down: ? What you eat. ? What  cosmetic products you use. ? What you drink. ? What you wear. This includes jewelry.  Keep all follow-up visits as told by your health care provider. This is important. Contact a health care provider if:  You sweat at night.  You lose weight.  You urinate more than normal.  You urinate less than normal, or you notice that your urine is a darker color than usual.  You feel weak.  You vomit.  Your skin or the whites of your eyes look yellow (jaundice).  Your skin: ? Tingles. ? Is numb.  Your rash: ? Does not go away after several days. ? Gets worse.  You are: ? Unusually thirsty. ? More tired than normal.  You have: ? New symptoms. ? Pain in your abdomen. ? A fever. ? Diarrhea. Get help right away if you:  Have a fever and your symptoms suddenly get worse.  Develop confusion.  Have a severe headache or a stiff neck.  Have severe joint pains or stiffness.  Have a seizure.  Develop a rash that covers all or most of your body. The rash may or may not be painful.  Develop blisters that: ? Are on top of the rash. ? Grow larger or grow together. ? Are painful. ? Are inside your nose or mouth.  Develop a rash that: ? Looks like purple pinprick-sized spots all over your body. ? Has a "bull's eye" or looks like a target. ? Is not related to sun exposure, is red and painful, and causes your skin to peel. Summary  A rash is a change in the color of your skin. Some rashes disappear after a few days, but some may last for a few weeks.  The goal of treatment is to stop the itching and keep the rash from spreading.  Take or apply over-the-counter and prescription medicines only as told by your health care provider.  Contact a health care provider if you have new or worsening symptoms.  Keep all follow-up visits as told by your health care provider. This is important. This information is not intended to replace advice given to you by your health care provider.  Make sure you discuss any questions you have with your health care provider. Document Released: 08/25/2002 Document Revised: 04/08/2018 Document Reviewed: 04/08/2018 Elsevier Interactive Patient Education  [employer].   If you have lab work done today you will be contacted with your lab results within the next 2 weeks.  If you have not heard from [place] then please contact us. The fastest way to get your results is to register for My Chart.   IF you received an x-ray today, you will receive an invoice from [facility]. Please contact [facility] at [phone] with questions or concerns regarding your invoice.   IF you  received labwork today, you will receive an invoice from [place]. Please contact [facility] at [phone] with questions or concerns regarding your invoice.   Our billing staff will not be able to assist you with questions regarding bills from these companies.  You will be contacted with the lab results as soon as they are available. The fastest way to get your results is to activate your My Chart account. Instructions are located on the last page of this paperwork. If you have not heard from [place] regarding the results in 2 weeks, please contact this office.

## 2019-01-08 NOTE — Progress Notes (Signed)
CC- wellness physical and headache f/u- Patient was schedule last week for physical but was not done due to other issus that was more important. Patient already had labs done and results are ready for review. Patient stated his headache are doing much better. Patient past the 6Cit and the functional status question.

## 2019-01-09 ENCOUNTER — Other Ambulatory Visit: Payer: Self-pay | Admitting: Emergency Medicine

## 2019-01-15 ENCOUNTER — Telehealth: Payer: Self-pay | Admitting: Family Medicine

## 2019-01-15 NOTE — Telephone Encounter (Signed)
Copied from [place] [phone]. Topic: General - Other >> Jan 15, 2019 11:38 AM Keene Breath wrote: Reason for CRM: Patient called to inform the doctor that he is experiencing muscle pain which is making it hard for him to move around because of his cholesterol medication.  Patient stated that the doctor told him that the muscle pain could be a side effect.  Please advise and call patient to let him know what he should do.  CB# [phone]

## 2019-01-16 NOTE — Telephone Encounter (Signed)
Dr. Carlota Raspberry,  I spoke with [name] he will discontinue the medication and see if that helps.  Please Advise on an alternative.

## 2019-01-16 NOTE — Telephone Encounter (Signed)
Relayed message and patient understood.

## 2019-01-16 NOTE — Telephone Encounter (Signed)
Stop atorvastatin for now.  Once muscle pain resolves, can try restarting in the next few weeks with 1 pill every 3 days or every 2 days to see if that is tolerated.  If not can change to different medication. Let me know.

## 2019-01-30 ENCOUNTER — Other Ambulatory Visit: Payer: Self-pay | Admitting: Family Medicine

## 2019-01-30 DIAGNOSIS — M545 Low back pain, unspecified: Secondary | ICD-10-CM

## 2019-01-30 NOTE — Telephone Encounter (Signed)
Requested medication (s) are due for refill today: Not on list  Requested medication (s) are on the active medication list: Not on list  Last refill:  04/22/18  Future visit scheduled: No  Notes to clinic:  See request    Requested Prescriptions  Pending Prescriptions Disp Refills   cyclobenzaprine (FLEXERIL) 5 MG tablet [Pharmacy Med Name: Cyclobenzaprine HCl Oral Tablet 5 MG] 15 tablet 0    Sig: TAKE ONE TABLET BY MOUTH THREE TIMES DAILY AS NEEDED FOR MUSCLE SPASM. START AT BEDTIME AS NEEDED DUE TO SEDATION     Not Delegated - Analgesics:  Muscle Relaxants Failed - 01/30/2019  1:14 PM      Failed - This refill cannot be delegated      Failed - Valid encounter within last 6 months    Recent Outpatient Visits          1 month ago Essential hypertension   [facility] [place], MD   9 months ago Low back pain, unspecified back pain laterality, unspecified chronicity, with sciatica presence unspecified   [facility] [place], Ranell Patrick, MD   9 months ago Cough   [facility] [place], Ranell Patrick, MD   1 year ago Diverticulitis of colon   [facility] [place], Ranell Patrick, MD   1 year ago Diverticulitis   [facility] [place] [facility], Ranell Patrick, MD

## 2019-01-31 NOTE — Telephone Encounter (Signed)
Flexeril refilled, but please schedule telemed visit if flare of back pain.

## 2019-02-19 ENCOUNTER — Other Ambulatory Visit: Payer: Self-pay | Admitting: Family Medicine

## 2019-02-19 DIAGNOSIS — I10 Essential (primary) hypertension: Secondary | ICD-10-CM

## 2019-02-19 MED ORDER — PRAVASTATIN SODIUM 20 MG PO TABS: 20.0000 mg | ORAL_TABLET | Freq: Every day | ORAL | 1 refills | Status: DC

## 2019-02-19 NOTE — Telephone Encounter (Signed)
Can try pravastatin for now- if aches on that as well, then stop until we can discuss options in office.

## 2019-02-19 NOTE — Telephone Encounter (Signed)
Pt states the atorvastatin (LIPITOR) 10 MG tablet is still making his joints ache.  Pt states he went off of it  As instructed, and he got better.  Pt went back on it, and his sx start all over again.  Joints aching.  Please advise.

## 2019-02-19 NOTE — Telephone Encounter (Signed)
Pt states he tried starting the Lipitor again and now having the joint pains again. He would like to try something different.

## 2019-02-20 NOTE — Telephone Encounter (Signed)
I have called the pt to relay provider message. There was no answer so left a message to call back.

## 2019-03-12 ENCOUNTER — Other Ambulatory Visit: Payer: Self-pay | Admitting: Family Medicine

## 2019-04-04 ENCOUNTER — Other Ambulatory Visit: Payer: Self-pay | Admitting: Family Medicine

## 2019-04-04 DIAGNOSIS — M545 Low back pain, unspecified: Secondary | ICD-10-CM

## 2019-04-04 NOTE — Telephone Encounter (Signed)
Requested Prescriptions  Pending Prescriptions Disp Refills  . cyclobenzaprine (FLEXERIL) 5 MG tablet [Pharmacy Med Name: Cyclobenzaprine HCl Oral Tablet 5 MG] 15 tablet 0    Sig: take one tablet by mouth three times daily as needed for muscle spasm. start at bedtime due to sedation     Not Delegated - Analgesics:  Muscle Relaxants Failed - 04/04/2019 12:09 PM      Failed - This refill cannot be delegated      Failed - Valid encounter within last 6 months    Recent Outpatient Visits          3 months ago Essential hypertension   [facility] [place], Ranell Patrick, MD   11 months ago Low back pain, unspecified back pain laterality, unspecified chronicity, with sciatica presence unspecified   [facility] [place] [facility], Ranell Patrick, MD   11 months ago Cough   [facility] [place] [facility], Ranell Patrick, MD   1 year ago Diverticulitis of colon   [facility] [place], Ranell Patrick, MD   1 year ago Diverticulitis   [facility] [place], Ranell Patrick, MD             . meloxicam (MOBIC) 7.5 MG tablet [Pharmacy Med Name: Meloxicam Oral Tablet 7.5 MG] 30 tablet 0    Sig: TAKE ONE TABLET BY MOUTH DAILY AS NEEDED     Analgesics:  COX2 Inhibitors Passed - 04/04/2019 12:09 PM      Passed - HGB in normal range and within 360 days    Hemoglobin  Date Value Ref Range Status  04/22/2018 16.0 14.1 - 18.1 g/dL Final  02/10/2016 14.3 13.0 - 17.0 g/dL Final         Passed - Cr in normal range and within 360 days    Creat  Date Value Ref Range Status  08/03/2015 0.92 0.70 - 1.25 mg/dL Final   Creatinine, Ser  Date Value Ref Range Status  12/30/2018 0.87 0.76 - 1.27 mg/dL Final         Passed - Patient is not pregnant      Passed - Valid encounter within last 12 months    Recent Outpatient Visits          3 months ago Essential hypertension   [facility] [place], Ranell Patrick, MD   11 months ago Low back pain, unspecified back pain laterality, unspecified  chronicity, with sciatica presence unspecified   [facility] [place] [facility], Ranell Patrick, MD   11 months ago Cough   [facility], Ranell Patrick, MD   1 year ago Diverticulitis of colon   [facility], Ranell Patrick, MD   1 year ago Diverticulitis   [facility], Ranell Patrick, MD

## 2019-05-13 ENCOUNTER — Encounter (HOSPITAL_COMMUNITY): Payer: Self-pay

## 2019-05-13 ENCOUNTER — Emergency Department (HOSPITAL_COMMUNITY)
Admission: EM | Admit: 2019-05-13 | Discharge: 2019-05-13 | Disposition: A | Discharge status: Home / Self Care | Payer: 59 | Attending: Emergency Medicine | Admitting: Emergency Medicine

## 2019-05-13 ENCOUNTER — Other Ambulatory Visit: Payer: Self-pay

## 2019-05-13 ENCOUNTER — Emergency Department (HOSPITAL_COMMUNITY): Payer: 59

## 2019-05-13 ENCOUNTER — Ambulatory Visit: Payer: Self-pay

## 2019-05-13 DIAGNOSIS — I10 Essential (primary) hypertension: Secondary | ICD-10-CM

## 2019-05-13 DIAGNOSIS — R110 Nausea: Secondary | ICD-10-CM | POA: Diagnosis not present

## 2019-05-13 DIAGNOSIS — J449 Chronic obstructive pulmonary disease, unspecified: Secondary | ICD-10-CM | POA: Diagnosis not present

## 2019-05-13 DIAGNOSIS — Z79899 Other long term (current) drug therapy: Secondary | ICD-10-CM | POA: Diagnosis not present

## 2019-05-13 DIAGNOSIS — R51 Headache: Secondary | ICD-10-CM | POA: Diagnosis not present

## 2019-05-13 DIAGNOSIS — M791 Myalgia, unspecified site: Secondary | ICD-10-CM | POA: Insufficient documentation

## 2019-05-13 DIAGNOSIS — Z87891 Personal history of nicotine dependence: Secondary | ICD-10-CM | POA: Diagnosis not present

## 2019-05-13 DIAGNOSIS — Z8546 Personal history of malignant neoplasm of prostate: Secondary | ICD-10-CM | POA: Diagnosis not present

## 2019-05-13 DIAGNOSIS — H538 Other visual disturbances: Secondary | ICD-10-CM | POA: Diagnosis not present

## 2019-05-13 DIAGNOSIS — Z96651 Presence of right artificial knee joint: Secondary | ICD-10-CM | POA: Insufficient documentation

## 2019-05-13 DIAGNOSIS — R519 Headache, unspecified: Secondary | ICD-10-CM

## 2019-05-13 LAB — BASIC METABOLIC PANEL
Anion gap: 8 (ref 5–15)
BUN: 10 mg/dL (ref 8–23)
CO2: 29 mmol/L (ref 22–32)
Calcium: 9.3 mg/dL (ref 8.9–10.3)
Chloride: 102 mmol/L (ref 98–111)
Creatinine, Ser: 0.85 mg/dL (ref 0.61–1.24)
GFR calc Af Amer: >60 mL/min (ref >60)
GFR calc non Af Amer: >60 mL/min (ref >60)
Glucose, Bld: 101 mg/dL — ABNORMAL HIGH (ref 70–99)
Potassium: 4.3 mmol/L (ref 3.5–5.1)
Sodium: 139 mmol/L (ref 135–145)

## 2019-05-13 LAB — CBC WITH DIFFERENTIAL/PLATELET
Abs Immature Granulocytes: 0.04 10*3/uL (ref 0.00–0.07)
Basophils Absolute: 0.1 10*3/uL (ref 0.0–0.1)
Basophils Relative: 1 %
Eosinophils Absolute: 0.1 10*3/uL (ref 0.0–0.5)
Eosinophils Relative: 1 %
HCT: 51.8 % (ref 39.0–52.0)
Hemoglobin: 16.5 g/dL (ref 13.0–17.0)
Immature Granulocytes: 0 %
Lymphocytes Relative: 19 %
Lymphs Abs: 2 10*3/uL (ref 0.7–4.0)
MCH: 30.1 pg (ref 26.0–34.0)
MCHC: 31.9 g/dL (ref 30.0–36.0)
MCV: 94.4 fL (ref 80.0–100.0)
Monocytes Absolute: 1.1 10*3/uL — ABNORMAL HIGH (ref 0.1–1.0)
Monocytes Relative: 10 %
Neutro Abs: 7.7 10*3/uL (ref 1.7–7.7)
Neutrophils Relative %: 69 %
Platelets: 264 10*3/uL (ref 150–400)
RBC: 5.49 MIL/uL (ref 4.22–5.81)
RDW: 12.4 % (ref 11.5–15.5)
WBC: 11 10*3/uL — ABNORMAL HIGH (ref 4.0–10.5)
nRBC: 0 % (ref 0.0–0.2)

## 2019-05-13 LAB — URINALYSIS, ROUTINE W REFLEX MICROSCOPIC
Bilirubin Urine: NEGATIVE
Glucose, UA: NEGATIVE mg/dL
Hgb urine dipstick: NEGATIVE
Ketones, ur: NEGATIVE mg/dL
Leukocytes,Ua: NEGATIVE
Nitrite: NEGATIVE
Protein, ur: NEGATIVE mg/dL
Specific Gravity, Urine: 1.01 (ref 1.005–1.030)
pH: 8 (ref 5.0–8.0)

## 2019-05-13 NOTE — Discharge Instructions (Signed)
You were seen in the ED today for high blood pressure and a headache.  Your labwork was very reassuring today and your  blood pressure has remained within normal limits while in the ED.  Please follow up with your PCP regarding your symptoms.  Return to the ED for any worsening symptoms.

## 2019-05-13 NOTE — Telephone Encounter (Signed)
Pt called stating that he has multiple symptoms and has been sent home from work. He states that today his temperature was 99.7 he has headache dizziness blurred vision nausea. He has body aches but they have gone. He has taken 3 tylenol. Patient was advised to read the package directions and not to exceed doses. Patient states his BP at the pharmacy at work was 151/92 and 161/92. He has headache and blurred vision. He has not missed a dose of BP medications in two days. Patient was advised to go to the ER for evaluation of BP with symptoms. [place] RN Stacy notified that patient would be coming by private car for evaluation.  Care advice read to patient. He verbalized understanding of all information.  Reason for Disposition . [1] COVID-19 infection suspected by caller or triager AND [2] mild symptoms (cough, fever, or others) AND [id number] no complications or SOB . [id number] Systolic BP  >= 0000000 OR Diastolic >= [id number] AND [id number] cardiac or neurologic symptoms (e.g., chest pain, difficulty breathing, unsteady gait, blurred vision)  Answer Assessment - Initial Assessment Questions 1. COVID-19 DIAGNOSIS: "Who made your Coronavirus (COVID-19) diagnosis?" "Was it confirmed by a positive lab test?" If not diagnosed by a HCP, ask "Are there lots of cases (community spread) where you live?" (See public health department website, if unsure)     [place] 2. ONSET: "When did the COVID-19 symptoms start?"     This am temp 99.7 3. WORST SYMPTOM: "What is your worst symptom?" (e.g., cough, fever, shortness of breath, muscle aches)    Aches headache 4. COUGH: "Do you have a cough?" If so, ask: "How bad is the cough?"       no 5. FEVER: "Do you have a fever?" If so, ask: "What is your temperature, how was it measured, and when did it start?"    99.7 6. RESPIRATORY STATUS: "Describe your breathing?" (e.g., shortness of breath, wheezing, unable to speak)      no 7. BETTER-SAME-WORSE: "Are you getting better, staying the same or  getting worse compared to yesterday?"  If getting worse, ask, "In what way?"     same 8. HIGH RISK DISEASE: "Do you have any chronic medical problems?" (e.g., asthma, heart or lung disease, weak immune system, etc.)     COPD 9. PREGNANCY: "Is there any chance you are pregnant?" "When was your last menstrual period?"    N/A 10. OTHER SYMPTOMS: "Do you have any other symptoms?"  (e.g., chills, fatigue, headache, loss of smell or taste, muscle pain, sore throat)      Headache,muscle pain,  Answer Assessment - Initial Assessment Questions 1. BLOOD PRESSURE: "What is the blood pressure?" "Did you take at least two measurements 5 minutes apart?"     151/92  161/92 2. ONSET: "When did you take your blood pressure?"    At par[facility]acy 3. HOW: "How did you obtain the blood pressure?" (e.g., visiting nurse, automatic home BP monitor)    pharmicist 4. HISTORY: "Do you have a history of high blood pressure?"     yes 5. MEDICATIONS: "Are you taking any medications for blood pressure?" "Have you missed any doses recently?"     No not in 2 days 6. OTHER SYMPTOMS: "Do you have any symptoms?" (e.g., headache, chest pain, blurred vision, difficulty breathing, weakness)     Headache blurred vision every now and  7. PREGNANCY: "Is there any chance you are pregnant?" "When was your last menstrual period?"    N/A  Protocols  used: CORONAVIRUS (COVID-19) DIAGNOSED OR SUSPECTED-A-AH, HIGH BLOOD PRESSURE-A-AH

## 2019-05-13 NOTE — ED Provider Notes (Signed)
[facility] DEPT Provider Note   CSN: [id number] Arrival date & time: 05/13/19  1045     History   Chief Complaint Chief Complaint  Patient presents with   Hypertension    HPI [name] is a 68 y.o. male with PMHx COPD, GERD, HTN presents to the ED today with complaints of high blood pressure.  States that he woke up this morning and noticed that he had a diffuse, achy, 8 out of 10 headache.  He states that he was also having some blurry vision, nausea, body aches.  Patient states he then checked his temperature which was 99.7.  Patient took 3 Tylenol for his headache and body aches and went to work.  Patient states he works at [employer].  He saw the pharmacist at [employer] who checked his blood pressure and reports it was high.  Patient reports blood pressure readings of 161/92 and 151/92.  he was told to go home and rest.  He then called his primary care doctor's office to have a telemedicine visit and spoke to the nurse on-call.  Patient was told that he needs to come to the ED for further evaluation given complaint of headache and high blood pressure.  She was also concerned about COVID given complaints of body aches and temperature of 99.7.  No known COVID-19 positive exposures.  Again patient works at [employer] and states that most of the guests wear masks but they cannot enforce if someone takes off their mask.  Pt denies chills, double vision, nausea, vomiting, diarrhea, constipation, abdominal pain, speech difficulties, confusion, unilateral weakness or numbness, any other associated symptoms.        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   S/P knee replacement 09/06/2015   COPD, mild ([place]) 07/20/2014   Pulmonary nodules 02/23/2014   Tobacco use disorder, moderate, in sustained remission 02/23/2014   Recurrent right inguinal hernia 10/20/2013   ED (erectile dysfunction) 08/26/2012   Physical exam, annual 11/05/2011   HTN (hypertension) 11/05/2011   GERD (gastroesophageal reflux disease) 11/05/2011   Chronic joint pain 11/05/2011    Past Surgical History:  Procedure Laterality Date   HERNIA REPAIR  last done 3-4 yrs ago   x 2   INGUINAL HERNIA REPAIR Right 11/20/2013   Procedure: OPEN REPAIR OF RECURRENT RIGHT INGUINAL HERNIA WITH MESH;  Surgeon: [name]. [name], MD;  Location: [facility];  Service: General;  Laterality: Right;   INSERTION OF MESH Right 11/20/2013   Procedure: INSERTION OF MESH;  Surgeon: [name]. [name], MD;  Location: [facility];  Service: General;  Laterality: Right;   KNEE SURGERY Left july 2014   mcl and meniscus repair   LYMPHADENECTOMY Bilateral 02/09/2016   Procedure: PELVIC LYMPHADENECTOMY;  Surgeon: [name], MD;  Location: [facility];  Service: Urology;  Laterality: Bilateral;   right knee arthroscopy      ROBOT ASSISTED LAPAROSCOPIC RADICAL  PROSTATECTOMY N/A 02/09/2016   Procedure: XI ROBOTIC ASSISTED LAPAROSCOPIC RADICAL PROSTATECTOMY WITH INDOCYANINE GREEN DYE AND ADHESIOLYSIS;  Surgeon: [name], MD;  Location: [facility];  Service: Urology;  Laterality: N/A;   TOTAL KNEE ARTHROPLASTY Right 09/06/2015   Procedure: RIGHT TOTAL KNEE ARTHROPLASTY;  Surgeon: [name], MD;  Location: [facility];  Service: Orthopedics;  Laterality: Right;        Home Medications    Prior to Admission medications   Medication Sig Start Date End Date Taking? Authorizing Provider  albuterol (PROVENTIL  HFA;VENTOLIN HFA) 108 (90 Base) MCG/ACT inhaler Inhale 2 puffs into the lungs every 6 (six) hours as needed for wheezing or shortness of breath. 03/12/17   Collene Gobble, MD  amLODipine (NORVASC) 5 MG tablet TAKE ONE TABLET BY MOUTH ONE TIME DAILY  02/19/19   [name], MD  azelastine (OPTIVAR) 0.05 % ophthalmic solution Place 1 drop into both eyes 2 (two) times daily as needed. For itching/allergy symptoms 04/22/18   [name], MD  cyclobenzaprine (FLEXERIL) 5 MG tablet take one tablet by mouth three times daily as needed for muscle spasm. start at bedtime due to sedation 04/04/19   [name], MD  esomeprazole (NEXIUM) 40 MG capsule TAKE 1 CAPSULE BY MOUTH DAILY 11/30/17   [name], MD  fluticasone [facility]) 50 MCG/ACT nasal spray PLACE 2 SPRAYS INTO BOTH NOSTRILS DAILY AS NEEDED FOR ALLERGIES. 02/21/18   [name], MD  fluticasone (KLS ALLER-FLO) 50 MCG/ACT nasal spray Place 2 sprays into both nostrils daily. 08/19/18   [name], MD  INCRUSE ELLIPTA 62.5 MCG/INH AEPB inhale 1 puff into lungs once daily 01/09/19   Collene Gobble, MD  KLS ALLERCLEAR 10 MG tablet TAKE 1 TABLET BY MOUTH DAILY 08/15/17   Collene Gobble, MD  KLS ESOMEPRAZOLE MAGNESIUM 20 MG capsule TAKE TWO CAPSULES BY MOUTH DAILY  03/12/19   [name], MD  meloxicam (MOBIC) 7.5 MG tablet TAKE ONE TABLET BY MOUTH DAILY AS NEEDED  04/04/19   [name], MD  Naphazoline-Pheniramine (OPCON-A) 0.027-0.315 % SOLN Apply 1 drop to eye 2 (two) times daily as needed (dry/irritated eyes).    [provider]  pravastatin (PRAVACHOL) 20 MG tablet Take 1 tablet (20 mg total) by mouth daily. 02/19/19   [name], MD  triamcinolone cream (KENALOG) 0.1 % Apply 1 application topically 2 (two) times daily as needed. To affected areas only as needed. 01/08/19   [name], MD    Family History Family History  Problem Relation Age of Onset   Cancer Mother    Cancer Father      Cancer Sister    Arthritis Sister    Cancer Maternal Grandmother    Heart disease Maternal Grandfather    Stroke Maternal Grandfather     Social History Social History   Tobacco Use   Smoking status: Former Smoker    Packs/day: 1.50    Years: 20.00    Pack years: 30.00    Types: Cigarettes    Quit date: 11/05/1999    Years since quitting: 19.5   Smokeless tobacco: Never Used  Substance Use Topics   Alcohol use: No    Comment: last used 25 years ago    Drug use: Yes    Types: Cocaine    Comment: last used cocaine  and acid 25 yrs ago,none used since     Allergies   Percocet [oxycodone-acetaminophen] and Amoxicillin  Review of Systems Review of Systems  Constitutional: Negative for chills.  HENT: Negative for congestion.   Eyes: Positive for visual disturbance (blurry).  Respiratory: Negative for cough and shortness of breath.   Cardiovascular: Negative for chest pain.  Gastrointestinal: Positive for nausea. Negative for abdominal pain, constipation, diarrhea and vomiting.  Genitourinary: Negative for dysuria.  Musculoskeletal: Positive for myalgias. Negative for neck pain and neck stiffness.  Skin: Negative for rash.  Neurological: Positive for headaches. Negative for syncope, weakness and numbness.     Physical Exam Updated Vital Signs BP (!) 156/101 (BP Location: Left Arm)    Pulse 97    Temp 98.7 F (37.1 C) (Oral)    Resp 18    SpO2 98%   Physical Exam Vitals signs and nursing note reviewed.  Constitutional:      Appearance: He is not ill-appearing or diaphoretic.  HENT:     Head: Normocephalic and atraumatic.     Right Ear: Tympanic membrane normal.     Left Ear: Tympanic membrane normal.     Mouth/Throat:     Mouth: Mucous membranes are moist.  Eyes:     Extraocular Movements: Extraocular movements intact.     Conjunctiva/sclera: Conjunctivae normal.     Pupils: Pupils are equal, round, and reactive to light.  Neck:      Musculoskeletal: Neck supple. No neck rigidity.     Comments: Negative Brudzinki's and Kernig's sign Cardiovascular:     Rate and Rhythm: Normal rate and regular rhythm.     Pulses: Normal pulses.  Pulmonary:     Effort: Pulmonary effort is normal.     Breath sounds: Normal breath sounds. No wheezing, rhonchi or rales.  Abdominal:     Palpations: Abdomen is soft.     Tenderness: There is no abdominal tenderness. There is no right CVA tenderness, left CVA tenderness, guarding or rebound.  Skin:    General: Skin is warm and dry.  Neurological:     Mental Status: He is alert.     Comments: CN 3-12 grossly intact A&O x4 GCS 15 Sensation and strength intact Gait nonataxic including with tandem walking Coordination with finger-to-nose WNL Neg romberg, neg pronator drift       ED Treatments / Results  Labs (all labs ordered are listed, but only abnormal results are displayed) Labs Reviewed  BASIC METABOLIC PANEL - Abnormal; Notable for the following components:      Result Value   Glucose, Bld 101 (*)    All other components within normal limits  CBC WITH DIFFERENTIAL/PLATELET - Abnormal; Notable for the following components:   WBC 11.0 (*)    Monocytes Absolute 1.1 (*)    All other components within normal limits  URINALYSIS, ROUTINE W REFLEX MICROSCOPIC - Abnormal; Notable for the following components:   Color, Urine STRAW (*)    All other components within normal limits    EKG None  Radiology Dg Chest Port 1 View  Result Date: 05/13/2019 CLINICAL DATA:  Fever, body aches, blurred vision, COPD, hypertension EXAM: PORTABLE CHEST 1 VIEW COMPARISON:  Portable exam 1304 hours compared to 12/18/2017 FINDINGS: Upper normal size of cardiac silhouette. Tortuous aorta. Mediastinal contours and pulmonary vascularity otherwise normal. Lungs clear. No acute infiltrate, pleural effusion or pneumothorax. Osseous structures unremarkable. IMPRESSION: No acute abnormalities.  Electronically Signed   By: Lavonia Dana M.D.   On: 05/13/2019 13:27    Procedures Procedures (including critical care time)  Medications Ordered in ED Medications - No data  to display   Initial Impression / Assessment and Plan / ED Course  I have reviewed the triage vital signs and the nursing notes.  Pertinent labs & imaging results that were available during my care of the patient were reviewed by me and considered in my medical decision making (see chart for details).    68 year old male who presents to the ED today with complaints of high blood pressure.  he had a telemedicine visit today after having a headache, blurred vision, nausea, temperature of 99.7 this morning as well as having a high blood pressure reading while at work of 161/92.  He was sent to the ED for further evaluation given the headache and high blood pressure.  Patient's blood pressure in the ED today is initially 156/101 but on repeat 153/90.  Patient states that he might have missed 1 dose of his amlodipine probably 3 to 4 days ago.  States he has been otherwise compliant.  Patient did take 3 Tylenol prior to going to work today.  He has been counseled thoroughly on taking above the recommended dose.  He states that his headache has since improved.  He no longer has blurry vision or body aches.  Telemedicine nurse had some concern for COVID.  Patient has had no COVID-19 positive exposures.  Concerning headache-patient has known focal neuro deficits on exam.  No meningeal signs.  Afebrile without tachycardia or tachypnea in the ED.  Obtain baseline blood work as well as a UA to ensure there are no issues with patient's kidneys.  Do not feel he needs imaging of his head today.  We will continue to reevaluate.   Cell count mildly elevated 11.  Again patient afebrile without tachycardia or tachypnea.  Suspect this is high due to headache.  Electrolyte abnormalities today.  Creatinine within normal limits.  UA without infection.   Specific gravity within normal limits.  Patient's blood pressure has continued to remain stable in the emergency department.  With most recent reading [id number] systolic.  Visual acuity without any abnormalities.    Reevaluation patient states that his headache has improved significantly after taking 3 Tylenol at home prior to coming to the emergency department.  Discussed potentially swabbing patient for COVID at this time given telemedicine nurse concern.  He does not want to be swab for COVID currently as he has a total knee replacement scheduled in 1 month I will need to be tested at that point.  Advised the patient follow-up with his primary care physician.  I advised that he stay home today and rest and monitor his symptoms.  He is in agreement with plan and stable for discharge at this time.      Final Clinical Impressions(s) / ED Diagnoses   Final diagnoses:  Essential hypertension  Acute nonintractable headache, unspecified headache type    ED Discharge Orders    None       Eustaquio Maize, PA-C 05/13/19 1505    Quintella Reichert, MD 05/15/19 [id number]

## 2019-05-13 NOTE — ED Notes (Signed)
Pt ambulatory to bathroom to provide urine specimen

## 2019-05-13 NOTE — Telephone Encounter (Signed)
Agree with ER eval.

## 2019-05-13 NOTE — Telephone Encounter (Signed)
Pt is being evaluated at the ER today.

## 2019-05-13 NOTE — ED Notes (Signed)
Per phone triage RN-states patient called with symptoms of HTN, blurred vision, body aches and a fever of 99.7-took 3 Tylenol for the body aches-sending to ED for evaluation

## 2019-05-16 ENCOUNTER — Telehealth: Payer: Self-pay | Admitting: Family Medicine

## 2019-05-16 NOTE — Telephone Encounter (Signed)
preop clearance form received - appt noted 8/31 - may be able to review at that time.

## 2019-05-19 ENCOUNTER — Encounter: Payer: Self-pay | Admitting: Family Medicine

## 2019-05-19 ENCOUNTER — Ambulatory Visit: Payer: Managed Care, Other (non HMO) | Admitting: Family Medicine

## 2019-05-19 ENCOUNTER — Other Ambulatory Visit: Payer: Self-pay

## 2019-05-19 VITALS — BP 151/81 | HR 76 | Temp 98.6°F | Ht 71.0 in | Wt 221.4 lb

## 2019-05-19 DIAGNOSIS — J309 Allergic rhinitis, unspecified: Secondary | ICD-10-CM

## 2019-05-19 DIAGNOSIS — R51 Headache: Secondary | ICD-10-CM | POA: Diagnosis not present

## 2019-05-19 DIAGNOSIS — E785 Hyperlipidemia, unspecified: Secondary | ICD-10-CM | POA: Diagnosis not present

## 2019-05-19 DIAGNOSIS — I10 Essential (primary) hypertension: Secondary | ICD-10-CM | POA: Diagnosis not present

## 2019-05-19 DIAGNOSIS — R519 Headache, unspecified: Secondary | ICD-10-CM

## 2019-05-19 MED ORDER — AMLODIPINE BESYLATE 2.5 MG PO TABS: 2.5000 mg | ORAL_TABLET | Freq: Every day | ORAL | 1 refills | Status: DC

## 2019-05-19 MED ORDER — PRAVASTATIN SODIUM 20 MG PO TABS: 20.0000 mg | ORAL_TABLET | Freq: Every day | ORAL | 1 refills | Status: DC

## 2019-05-19 NOTE — Progress Notes (Signed)
Subjective:    Patient ID: [name], male    DOB: [birth year], 68 y.o.   MRN: [id number]  HPI [name] is a 68 y.o. male Presents today for: Chief Complaint  Patient presents with  . Hypertension    BP Follow up  . Statin med reaction    itchy skin   Hypertension: BP Readings from Last 3 Encounters:  05/19/19 (!) 151/81  05/13/19 128/89  12/30/18 128/82   Lab Results  Component Value Date   CREATININE 0.85 05/13/2019  Evaluated August 25 in the ER with concern for high blood pressure.  Woke up that morning with diffuse 8 out of 10 headache.  Noted associated blurry vision, nausea, increased body aches.  Temperature was 99.7, then 98 after an hour.   Took 3 Tylenol for headache and body aches and went to work.  Blood pressure at his job 161/92, 151/92. On ER evaluation, blood pressure 156/1 1, then 153/90.  Potentially may have missed 1 of his amlodipine was a few days prior.  Blurry vision and headache had resolved.  Monitored in the ER with BP lowering to 629 systolic.  Normal visual acuity.  Did not appear to need imaging.  COVID testing was declined. Mild leukocytosis of 11, without left shift.  Glucose 101 but other electrolytes normal.  No proteinuria and overall reassuring urinalysis in ER.  Since ER visit.  Slight headache - front of head, no new bodyaches, chronic neck and back pain. No fever, no chills.  Dry cough for past few weeks. No new dyspnea.  No change in taste/smell.  Nausea on 8/25, but no vomiting, no recurrence of nausea. No abd pain.  Off and on blurry vision for months. Has not met with optho recently (last appt in January this year).  No focal arm weakness, slurred speech.  Occasional HA in past - thought to be d/t allergies. allerclear helps HA in past, for allergies.  Had been off that , restarted about a week ago. Using fluticasone 1spr/nostril QD - helps congestion.  HA 3/10 currently. 8-9/10 last week.  amlodipine 80m QD. No missed  doses past few days. On mobic only as needed - 2 times per week.  History of urinary frequency/inconntinence treated by urology - s/p prostatectomy.   BP Readings from Last 3 Encounters:  05/19/19 (!) 151/81  05/13/19 128/89  12/30/18 128/82   Lab Results  Component Value Date   CREATININE 0.85 05/13/2019    Skin itching: On ankles, long time.  Swelling in ankles for long time.  Discussed with dermatology - recommended gold bond.   Hyperlipidemia:  Lab Results  Component Value Date   CHOL 199 12/30/2018   HDL 39 (L) 12/30/2018   LDLCALC 131 (H) 12/30/2018   TRIG 143 12/30/2018   CHOLHDL 5.1 (H) 12/30/2018   Lab Results  Component Value Date   ALT 38 12/30/2018   AST 21 12/30/2018   ALKPHOS 109 12/30/2018   BILITOT 0.7 12/30/2018  since April - pravastatin 233mqd - took once per day, stopped for a few weeks - no change in itching above, so restarted.   Plans on total knee replacement September 29.  Plan to follow-up separately for preoperative evaluation.   Patient Active Problem List   Diagnosis Date Noted  . Allergic rhinitis 03/12/2017  . Thrush, oral 02/27/2017  . SUI (stress urinary incontinence), male 03/20/2016  . Prostate cancer [place]/24/2017  . Obese 09/08/2015  . S/P right TKA 09/06/2015  .  S/P knee replacement 09/06/2015  . 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 Pulmonary nodules 02/23/2014  . Tobacco use disorder, moderate, in sustained remission 02/23/2014  . Recurrent right inguinal hernia 10/20/2013  . ED (erectile dysfunction) 08/26/2012  . Physical exam, annual 11/05/2011  . HTN (hypertension) 11/05/2011  . GERD (gastroesophageal reflux disease) 11/05/2011  . Chronic joint pain 11/05/2011   Past Medical History:  Diagnosis Date  . Allergy    seasonal  . Arthritis    oa  . Asthma   . COPD (chronic obstructive pulmonary disease) ([place])   . GERD (gastroesophageal reflux disease)   . Hypertension   . Prostate cancer ([place]) 01/2016    prostatectomy   . Shortness of breath dyspnea    Past Surgical History:  Procedure Laterality Date  . HERNIA REPAIR  last done 3-4 yrs ago   x 2  . INGUINAL HERNIA REPAIR Right 11/20/2013   Procedure: OPEN REPAIR OF RECURRENT RIGHT INGUINAL HERNIA WITH MESH;  Surgeon: [name]. [name], MD;  Location: [facility];  Service: General;  Laterality: Right;  . INSERTION OF MESH Right 11/20/2013   Procedure: INSERTION OF MESH;  Surgeon: [name]. [name], MD;  Location: [facility];  Service: General;  Laterality: Right;  . KNEE SURGERY Left july 2014   mcl and meniscus repair  . LYMPHADENECTOMY Bilateral 02/09/2016   Procedure: PELVIC LYMPHADENECTOMY;  Surgeon: [name], MD;  Location: [facility];  Service: Urology;  Laterality: Bilateral;  . right knee arthroscopy     . ROBOT ASSISTED LAPAROSCOPIC RADICAL PROSTATECTOMY N/A 02/09/2016   Procedure: XI ROBOTIC ASSISTED LAPAROSCOPIC RADICAL PROSTATECTOMY WITH INDOCYANINE GREEN DYE AND ADHESIOLYSIS;  Surgeon: [name], MD;  Location: [facility];  Service: Urology;  Laterality: N/A;  . TOTAL KNEE ARTHROPLASTY Right 09/06/2015   Procedure: RIGHT TOTAL KNEE ARTHROPLASTY;  Surgeon: [name], MD;  Location: [facility];  Service: Orthopedics;  Laterality: Right;   Allergies  Allergen Reactions  . Percocet [Oxycodone-Acetaminophen] Other (See Comments)    Nausea-Doesn't work  . Amoxicillin Rash    Has patient had a PCN reaction causing immediate rash, facial/tongue/throat swelling, SOB or lightheadedness with hypotension: No Has patient had a PCN reaction causing severe rash involving mucus membranes or skin necrosis: No Has patient had a PCN reaction that required hospitalization No Has patient had a PCN reaction occurring within the last 10 years: Yes- 2016 If all of the above answers are "NO", then may proceed with Cephalosporin use.    Prior to Admission medications   Medication Sig Start Date End Date Taking? Authorizing Provider  amLODipine (NORVASC) 5 MG  tablet TAKE ONE TABLET BY MOUTH ONE TIME DAILY  02/19/19  Yes [name], MD  azelastine (OPTIVAR) 0.05 % ophthalmic solution Place 1 drop into both eyes 2 (two) times daily as needed. For itching/allergy symptoms 04/22/18  Yes [name], MD  cyclobenzaprine (FLEXERIL) 5 MG tablet take one tablet by mouth three times daily as needed for muscle spasm. start at bedtime due to sedation 04/04/19  Yes [name], MD  esomeprazole (NEXIUM) 40 MG capsule TAKE 1 CAPSULE BY MOUTH DAILY 11/30/17  Yes [name], MD  fluticasone (KLS ALLER-FLO) 50 MCG/ACT nasal spray Place 2 sprays into both nostrils daily. 08/19/18  Yes [name], MD  INCRUSE ELLIPTA 62.5 MCG/INH AEPB inhale 1 puff into lungs once daily 01/09/19  Yes Byrum, Rose Fillers, MD  KLS ALLERCLEAR 10 MG tablet TAKE 1 TABLET BY MOUTH DAILY 08/15/17  Yes Collene Gobble, MD  KLS ESOMEPRAZOLE MAGNESIUM 20 MG capsule TAKE TWO CAPSULES BY MOUTH DAILY  03/12/19  Yes [name], MD  meloxicam (MOBIC) 7.5 MG tablet TAKE ONE TABLET BY MOUTH DAILY AS NEEDED  04/04/19  Yes [name], MD  Naphazoline-Pheniramine (OPCON-A) 0.027-0.315 % SOLN Apply 1 drop to eye 2 (two) times daily as needed (dry/irritated eyes).   Yes [provider]  pravastatin (PRAVACHOL) 20 MG tablet Take 1 tablet (20 mg total) by mouth daily. 02/19/19  Yes [name], MD  triamcinolone cream (KENALOG) 0.1 % Apply 1 application topically 2 (two) times daily as needed. To affected areas only as needed. 01/08/19  Yes [name], MD  albuterol (PROVENTIL HFA;VENTOLIN HFA) 108 (90 Base) MCG/ACT inhaler Inhale 2 puffs into the lungs every 6 (six) hours as needed for wheezing or shortness of breath. Patient not taking: Reported on 05/19/2019 03/12/17   Collene Gobble, MD   Social History   Socioeconomic History  . Marital status: Married    Spouse name: Not on file  . Number of children: Not on file  . Years of education: Not on file  .  Highest education level: Not on file  Occupational History  . Not on file  Social Needs  . Financial resource strain: Not on file  . Food insecurity    Worry: Not on file    Inability: Not on file  . Transportation needs    Medical: Not on file    Non-medical: Not on file  Tobacco Use  . Smoking status: Former Smoker    Packs/day: 1.50    Years: 20.00    Pack years: 30.00    Types: Cigarettes    Quit date: 11/05/1999    Years since quitting: 19.5  . Smokeless tobacco: Never Used  Substance and Sexual Activity  . Alcohol use: No    Comment: last used 25 years ago   . Drug use: Yes    Types: Cocaine    Comment: last used cocaine  and acid 25 yrs ago,none used since  . Sexual activity: Not on file  Lifestyle  . Physical activity    Days per week: Not on file    Minutes per session: Not on file  . Stress: Not on file  Relationships  . Social Herbalist on phone: Not on file    Gets together: Not on file    Attends religious service: Not on file    Active member of club or organization: Not on file    Attends meetings of clubs or organizations: Not on file    Relationship status: Not on file  . Intimate partner violence    Fear of current or ex partner: Not on file    Emotionally abused: Not on file    Physically abused: Not on file    Forced sexual activity: Not on file  Other Topics Concern  . Not on file  Social History Narrative  . Not on file    Review of Systems     Objective:   Physical Exam Vitals signs reviewed.  Constitutional:      Appearance: He is well-developed.  HENT:     Head: Normocephalic and atraumatic.     Nose:     Comments: Sinuses nontender.  Eyes:     Pupils: Pupils are equal, round, and reactive to light.  Neck:     Vascular: No carotid bruit or JVD.  Cardiovascular:     Rate and Rhythm: Normal rate and regular rhythm.     Heart sounds: Normal heart sounds. No murmur.  Pulmonary:     Effort: Pulmonary effort is  normal.     Breath sounds: Normal breath sounds. No rales.  Skin:    General: Skin is warm and dry.  Neurological:     General: No focal deficit present.     Mental Status: He is alert and oriented to person, place, and time.     Cranial Nerves: No facial asymmetry.     Motor: Motor function is intact. No weakness, tremor or pronator drift.     Coordination: Coordination is intact.     Gait: Gait normal.    Vitals:   05/19/19 1054  BP: (!) 151/81  Pulse: 76  Temp: 98.6 F (37 C)  TempSrc: Oral  SpO2: 95%  Weight: 221 lb 6.4 oz (100.4 kg)  Height: '5\' 11"'$  (1.803 m)   Exam performed with N95 mask, face shield and gown/gloves.    Assessment & Plan:   [name] is a 68 y.o. male Nonintractable episodic headache, unspecified headache type - Plan: Novel Coronavirus, NAA ([facility])  -Headache could have been related to elevated blood pressure, but with other symptoms above did recommend COVID-19 testing, although less likely cause.  Testing performed in office.  ER precautions given if any new neurologic symptoms or acute worsening headache.  Recheck within the next 10 days.  Did recommend self isolation until negative testing results.   Essential hypertension - Plan: amLODipine (NORVASC) 2.5 MG tablet  -Decreased control, add additional 2.5 mg of amlodipine for total dose of 7.5 mg.  Allergic rhinitis, unspecified seasonality, unspecified trigger  -Option of increasing the fluticasone nasal spray to 2 sprays per nostril each day.  Continue AllerClear.  Hyperlipidemia, unspecified hyperlipidemia type - Plan: pravastatin (PRAVACHOL) 20 MG tablet  -Continue pravastatin for now, plan for labs at next visit.  Meds ordered this encounter  Medications  . amLODipine (NORVASC) 2.5 MG tablet    Sig: Take 1 tablet (2.5 mg total) by mouth daily.    Dispense:  30 tablet    Refill:  1  . pravastatin (PRAVACHOL) 20 MG tablet    Sig: Take 1 tablet (20 mg total) by mouth daily.     Dispense:  30 tablet    Refill:  1   Patient Instructions   Although less likely, I did check a COVID-19 test today based on the symptoms he had last week.  I would recommend self isolation as much as possible until those results have returned in the next few days.  If any new or worsening symptoms, be evaluated right away.  Headache could be related to blood pressure and possibly allergies.  Okay to try the fluticasone nasal spray up to 2 sprays in each nostril each day if that is more effective.  Continue the AllerClear.  Increase amlodipine by 2.5 mg for a total dose of 7.5 mg/day.  Try to check your blood pressures at home and bring the record those to your next visit within the next 10 days.  At next visit I can check some lab work as we discussed.  I did refill your cholesterol medicine the same for now  Return to the clinic or go to the nearest emergency room if any of your symptoms worsen or new symptoms occur.   If you have lab work done today you will be contacted with your lab results  within the next 2 weeks.  If you have not heard from [place] then please contact us. The fastest way to get your results is to register for My Chart.   IF you received an x-ray today, you will receive an invoice from [facility]. Please contact [facility] at [phone] with questions or concerns regarding your invoice.   IF you received labwork today, you will receive an invoice from [place]. Please contact [facility] at [phone] with questions or concerns regarding your invoice.   Our billing staff will not be able to assist you with questions regarding bills from these companies.  You will be contacted with the lab results as soon as they are available. The fastest way to get your results is to activate your My Chart account. Instructions are located on the last page of this paperwork. If you have not heard from [place] regarding the results in 2 weeks, please contact this office.        Signed,   [name], MD [facility].  05/20/19 9:27 PM

## 2019-05-19 NOTE — Patient Instructions (Addendum)
Although less likely, I did check a COVID-19 test today based on the symptoms he had last week.  I would recommend self isolation as much as possible until those results have returned in the next few days.  If any new or worsening symptoms, be evaluated right away.  Headache could be related to blood pressure and possibly allergies.  Okay to try the fluticasone nasal spray up to 2 sprays in each nostril each day if that is more effective.  Continue the AllerClear.  Increase amlodipine by 2.5 mg for a total dose of 7.5 mg/day.  Try to check your blood pressures at home and bring the record those to your next visit within the next 10 days.  At next visit I can check some lab work as we discussed.  I did refill your cholesterol medicine the same for now  Return to the clinic or go to the nearest emergency room if any of your symptoms worsen or new symptoms occur.   If you have lab work done today you will be contacted with your lab results within the next 2 weeks.  If you have not heard from [place] then please contact us. The fastest way to get your results is to register for My Chart.   IF you received an x-ray today, you will receive an invoice from [facility]. Please contact [facility] at [phone] with questions or concerns regarding your invoice.   IF you received labwork today, you will receive an invoice from [place]. Please contact [facility] at [phone] with questions or concerns regarding your invoice.   Our billing staff will not be able to assist you with questions regarding bills from these companies.  You will be contacted with the lab results as soon as they are available. The fastest way to get your results is to activate your My Chart account. Instructions are located on the last page of this paperwork. If you have not heard from [place] regarding the results in 2 weeks, please contact this office.

## 2019-05-21 LAB — NOVEL CORONAVIRUS, NAA: SARS-CoV-2, NAA: NOT DETECTED

## 2019-05-22 ENCOUNTER — Telehealth: Payer: Self-pay | Admitting: Family Medicine

## 2019-05-22 NOTE — Telephone Encounter (Signed)
Negative COVID results given. Patient results "NOT Detected." Caller expressed understanding.   Pt needs a letter stating that his results were negative and that he can return to work on Saturday 9.5.20/ please advise

## 2019-05-23 ENCOUNTER — Encounter: Payer: Self-pay | Admitting: Family Medicine

## 2019-05-23 NOTE — Telephone Encounter (Signed)
Letter had been printed and at the front for pt to pick up.

## 2019-05-29 ENCOUNTER — Other Ambulatory Visit: Payer: Self-pay | Admitting: Family Medicine

## 2019-05-29 DIAGNOSIS — I10 Essential (primary) hypertension: Secondary | ICD-10-CM

## 2019-05-29 NOTE — Telephone Encounter (Signed)
[place] called in regards to pt prescription changing in dose. Pharmacy need confirmation  Norvasc 5 MG down to 2.5 MG  CB is  [id number]

## 2019-05-29 NOTE — Telephone Encounter (Signed)
Requested medication (s) are due for refill today: yes  Requested medication (s) are on the active medication list: yes  Last refill:  02/2019  Future visit scheduled: yes  Notes to clinic:  Please review to see if refill is necessary  Looks like a different strength was sent in on 31st.   Requested Prescriptions  Pending Prescriptions Disp Refills   amLODipine (NORVASC) 5 MG tablet [Pharmacy Med Name: amLODIPine Besylate Oral Tablet 5 MG] 90 tablet 0    Sig: TAKE ONE TABLET BY MOUTH ONE TIME DAILY     Cardiovascular:  Calcium Channel Blockers Failed - 05/29/2019  8:46 AM      Failed - Last BP in normal range    BP Readings from Last 1 Encounters:  05/19/19 (!) 151/81         Passed - Valid encounter within last 6 months    Recent Outpatient Visits          1 week ago Nonintractable episodic headache, unspecified headache type   [facility], Ranell Patrick, MD   4 months ago Medicare annual wellness visit, subsequent   [facility], Ranell Patrick, MD   5 months ago Essential hypertension   [facility], Ranell Patrick, MD   1 year ago Low back pain, unspecified back pain laterality, unspecified chronicity, with sciatica presence unspecified   [facility], Ranell Patrick, MD   1 year ago Cough   [facility], Ranell Patrick, MD      Future Appointments            In 4 days [name], MD [facility] [place], [facility]

## 2019-06-02 ENCOUNTER — Ambulatory Visit: Payer: Managed Care, Other (non HMO) | Admitting: Family Medicine

## 2019-06-02 ENCOUNTER — Ambulatory Visit (INDEPENDENT_AMBULATORY_CARE_PROVIDER_SITE_OTHER): Payer: Managed Care, Other (non HMO)

## 2019-06-02 ENCOUNTER — Encounter: Payer: Self-pay | Admitting: Family Medicine

## 2019-06-02 ENCOUNTER — Ambulatory Visit: Payer: Managed Care, Other (non HMO)

## 2019-06-02 ENCOUNTER — Other Ambulatory Visit: Payer: Self-pay

## 2019-06-02 VITALS — BP 134/81 | HR 92 | Temp 97.8°F | Resp 14 | Wt 222.6 lb

## 2019-06-02 DIAGNOSIS — R519 Headache, unspecified: Secondary | ICD-10-CM

## 2019-06-02 DIAGNOSIS — Z01818 Encounter for other preprocedural examination: Secondary | ICD-10-CM

## 2019-06-02 DIAGNOSIS — R51 Headache: Secondary | ICD-10-CM

## 2019-06-02 DIAGNOSIS — I10 Essential (primary) hypertension: Secondary | ICD-10-CM

## 2019-06-02 DIAGNOSIS — M25562 Pain in left knee: Secondary | ICD-10-CM | POA: Insufficient documentation

## 2019-06-02 MED ORDER — AMLODIPINE BESYLATE 5 MG PO TABS: 5.0000 mg | ORAL_TABLET | Freq: Every day | ORAL | 0 refills | Status: DC

## 2019-06-02 NOTE — Patient Instructions (Addendum)
  7.5 mg of amlodipine appears to be a good dose.  Continue same.  Can use up to 2 sprays in each nostril nasal spray to see if that also helps with headaches.  If headaches worsen please return for recheck.  I will complete your preoperative paperwork once I review some labs and chest x-ray report.  Follow-up with me in 6 weeks, sooner if needed.   If you have lab work done today you will be contacted with your lab results within the next 2 weeks.  If you have not heard from [place] then please contact us. The fastest way to get your results is to register for My Chart.   IF you received an x-ray today, you will receive an invoice from [facility]. Please contact [facility] at [phone] with questions or concerns regarding your invoice.   IF you received labwork today, you will receive an invoice from [place]. Please contact [facility] at [phone] with questions or concerns regarding your invoice.   Our billing staff will not be able to assist you with questions regarding bills from these companies.  You will be contacted with the lab results as soon as they are available. The fastest way to get your results is to activate your My Chart account. Instructions are located on the last page of this paperwork. If you have not heard from [place] regarding the results in 2 weeks, please contact this office.

## 2019-06-02 NOTE — Progress Notes (Signed)
Subjective:    Patient ID: [name], male    DOB: 29-Sep-1950, 68 y.o.   MRN: [id number]  HPI [name] is a 68 y.o. male Presents today for: Chief Complaint  Patient presents with  . Headache    10 day f/u  . Hypertension    Also need a refill on amlodipine 5 mg   Headache, HTN See last office visit, previous headache could have been related to elevated blood pressure, COVID testing was negative.  Decreased blood pressure control, additional 2.5 mg of amlodipine added for total dose of 7.5 mg. Additionally recommended Flonase increase to 2 sprays per nostril each day, continue AllerClear for allergies. Only slight HA now, better.  Taking 7.5mg  norvasc total.min swelling at ankles- chronic, no new side effects.  Home readings - none.   Hyperlipidemia:  Lab Results  Component Value Date   CHOL 199 12/30/2018   HDL 39 (L) 12/30/2018   LDLCALC 131 (H) 12/30/2018   TRIG 143 12/30/2018   CHOLHDL 5.1 (H) 12/30/2018   Lab Results  Component Value Date   ALT 38 12/30/2018   AST 21 12/30/2018   ALKPHOS 109 12/30/2018   BILITOT 0.7 12/30/2018  Tolerating pravastatin, labs pending today. No new myalgias/side effects.   Preoperative evaluation: Plan for left total knee replacement with Dr. Alvan Dame on September 29. History of hypertension, COPD, hyperlipidemia, no known cardiac disease.  He is followed by pulmonary, Dr. [name] for COPD and pulmonary nodules. R knee replacement 3 years ago - no problems with anesthesia.  No CP, dyspnea with 2 flights oaf stairs.  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   COPD stable with Incruse Ellipta.  No hx of OSA.   Patient Active Problem List   Diagnosis Date Noted  . Allergic rhinitis 03/12/2017  . Thrush, oral 02/27/2017  . SUI (stress urinary incontinence), male 03/20/2016  . Prostate cancer ([place]) 02/09/2016  . Obese 09/08/2015  . S/P right TKA 09/06/2015  . S/P knee  replacement 09/06/2015  . COPD, mild ([place]) 07/20/2014  . Pulmonary nodules 02/23/2014  . Tobacco use disorder, moderate, in sustained remission 02/23/2014  . Recurrent right inguinal hernia 10/20/2013  . ED (erectile dysfunction) 08/26/2012  . Physical exam, annual 11/05/2011  . HTN (hypertension) 11/05/2011  . GERD (gastroesophageal reflux disease) 11/05/2011  . Chronic joint pain 11/05/2011   Past Medical History:  Diagnosis Date  . Allergy    seasonal  . Arthritis    oa  . Asthma   . COPD (chronic obstructive pulmonary disease) ([place])   . GERD (gastroesophageal reflux disease)   . Hypertension   . Prostate cancer ([place]) 01/2016   prostatectomy   . Shortness of breath dyspnea    Past Surgical History:  Procedure Laterality Date  . HERNIA REPAIR  last done 3-4 yrs ago   x 2  . INGUINAL HERNIA REPAIR Right 11/20/2013   Procedure: OPEN REPAIR OF RECURRENT RIGHT INGUINAL HERNIA WITH MESH;  Surgeon: [name]. [name], MD;  Location: [facility];  Service: General;  Laterality: Right;  . INSERTION OF MESH Right 11/20/2013   Procedure: INSERTION OF MESH;  Surgeon: [name]. [name], MD;  Location: [facility];  Service: General;  Laterality: Right;  . KNEE SURGERY Left july 2014   mcl and meniscus repair  . LYMPHADENECTOMY Bilateral 02/09/2016   Procedure: PELVIC LYMPHADENECTOMY;  Surgeon: [name], MD;  Location: [facility];  Service: Urology;  Laterality: Bilateral;  . right knee arthroscopy     . ROBOT ASSISTED LAPAROSCOPIC RADICAL PROSTATECTOMY N/A 02/09/2016   Procedure: XI ROBOTIC ASSISTED LAPAROSCOPIC RADICAL PROSTATECTOMY WITH INDOCYANINE GREEN DYE AND ADHESIOLYSIS;  Surgeon: [name], MD;  [facility];  Service: Urology;  Laterality: N/A;  . TOTAL KNEE ARTHROPLASTY Right 09/06/2015   Procedure: RIGHT TOTAL KNEE ARTHROPLASTY;  Surgeon: [name], MD;  Location: [facility];  Service: Orthopedics;  Laterality: Right;   Allergies  Allergen Reactions  . Percocet  [Oxycodone-Acetaminophen] Other (See Comments)    Nausea-Doesn't work  . Amoxicillin Rash    Has patient had a PCN reaction causing immediate rash, facial/tongue/throat swelling, SOB or lightheadedness with hypotension: No Has patient had a PCN reaction causing severe rash involving mucus membranes or skin necrosis: No Has patient had a PCN reaction that required hospitalization No Has patient had a PCN reaction occurring within the last 10 years: Yes- 2016 If all of the above answers are "NO", then may proceed with Cephalosporin use.    Prior to Admission medications   Medication Sig Start Date End Date Taking? Authorizing Provider  albuterol (PROVENTIL HFA;VENTOLIN HFA) 108 (90 Base) MCG/ACT inhaler Inhale 2 puffs into the lungs every 6 (six) hours as needed for wheezing or shortness of breath. 03/12/17  Yes Byrum, Rose Fillers, MD  amLODipine (NORVASC) 2.5 MG tablet Take 1 tablet (2.5 mg total) by mouth daily. 05/19/19  Yes [name], MD  amLODipine (NORVASC) 5 MG tablet TAKE ONE TABLET BY MOUTH ONE TIME DAILY  02/19/19  Yes [name], MD  azelastine (OPTIVAR) 0.05 % ophthalmic solution Place 1 drop into both eyes 2 (two) times daily as needed. For itching/allergy symptoms 04/22/18  Yes [name], MD  cyclobenzaprine (FLEXERIL) 5 MG tablet take one tablet by mouth three times daily as needed for muscle spasm. start at bedtime due to sedation 04/04/19  Yes [name], MD  esomeprazole (NEXIUM) 40 MG capsule TAKE 1 CAPSULE BY MOUTH DAILY 11/30/17  Yes [name], MD  fluticasone (KLS ALLER-FLO) 50 MCG/ACT nasal spray Place 2 sprays into both nostrils daily. 08/19/18  Yes [name], MD  INCRUSE ELLIPTA 62.5 MCG/INH AEPB inhale 1 puff into lungs once daily 01/09/19  Yes Byrum, Rose Fillers, MD  KLS ALLERCLEAR 10 MG tablet TAKE 1 TABLET BY MOUTH DAILY 08/15/17  Yes Collene Gobble, MD  KLS ESOMEPRAZOLE MAGNESIUM 20 MG capsule TAKE TWO CAPSULES BY MOUTH DAILY  03/12/19   Yes [name], MD  meloxicam (MOBIC) 7.5 MG tablet TAKE ONE TABLET BY MOUTH DAILY AS NEEDED  04/04/19  Yes [name], MD  Naphazoline-Pheniramine (OPCON-A) 0.027-0.315 % SOLN Apply 1 drop to eye 2 (two) times daily as needed (dry/irritated eyes).   Yes [provider]  pravastatin (PRAVACHOL) 20 MG tablet Take 1 tablet (20 mg total) by mouth daily. 05/19/19  Yes [name], MD  triamcinolone cream (KENALOG) 0.1 % Apply 1 application topically 2 (two) times daily as needed. To affected areas only as needed. 01/08/19  Yes [name], MD   Social History   Socioeconomic History  . Marital status: Married    Spouse name: Not on file  . Number of children: Not on file  . Years of education: Not on file  . Highest education level: Not on file  Occupational History  . Not on file  Social Needs  . Financial resource strain: Not on file  . Food  insecurity    Worry: Not on file    Inability: Not on file  . Transportation needs    Medical: Not on file    Non-medical: Not on file  Tobacco Use  . Smoking status: Former Smoker    Packs/day: 1.50    Years: 20.00    Pack years: 30.00    Types: Cigarettes    Quit date: 11/05/1999    Years since quitting: 19.5  . Smokeless tobacco: Never Used  Substance and Sexual Activity  . Alcohol use: No    Comment: last used 25 years ago   . Drug use: Yes    Types: Cocaine    Comment: last used cocaine  and acid 25 yrs ago,none used since  . Sexual activity: Not on file  Lifestyle  . Physical activity    Days per week: Not on file    Minutes per session: Not on file  . Stress: Not on file  Relationships  . Social Herbalist on phone: Not on file    Gets together: Not on file    Attends religious service: Not on file    Active member of club or organization: Not on file    Attends meetings of clubs or organizations: Not on file    Relationship status: Not on file  . Intimate partner violence    Fear  of current or ex partner: Not on file    Emotionally abused: Not on file    Physically abused: Not on file    Forced sexual activity: Not on file  Other Topics Concern  . Not on file  Social History Narrative  . Not on file    Review of Systems  Constitutional: Negative for fatigue and unexpected weight change.  Eyes: Negative for visual disturbance.  Respiratory: Negative for cough, chest tightness and shortness of breath.   Cardiovascular: Negative for chest pain, palpitations and leg swelling.  Gastrointestinal: Negative for abdominal pain and blood in stool.  Neurological: Negative for dizziness, light-headedness and headaches.   Per HPI.       Objective:   Physical Exam Vitals signs reviewed.  Constitutional:      Appearance: He is well-developed.  HENT:     Head: Normocephalic and atraumatic.  Eyes:     Pupils: Pupils are equal, round, and reactive to light.  Neck:     Vascular: No carotid bruit or JVD.  Cardiovascular:     Rate and Rhythm: Normal rate and regular rhythm.     Heart sounds: Normal heart sounds. No murmur. No gallop.   Pulmonary:     Effort: Pulmonary effort is normal.     Breath sounds: Normal breath sounds. No rales.  Skin:    General: Skin is warm and dry.  Neurological:     Mental Status: He is alert and oriented to person, place, and time.     Vitals:   06/02/19 1210  BP: 134/81  Pulse: 92  Resp: 14  Temp: 97.8 F (36.6 C)  TempSrc: Oral  SpO2: 97%  Weight: 222 lb 9.6 oz (101 kg)   EKG: sr, first-degree block with PR 226.  Possible left atrial enlargement.  No apparent acute findings.  Dg Chest 2 View  Result Date: 06/02/2019 CLINICAL DATA:  Preop. EXAM: CHEST - 2 VIEW COMPARISON:  Radiograph of May 13, 2019. FINDINGS: The heart size and mediastinal contours are within normal limits. Both lungs are clear. The visualized skeletal structures are unremarkable. IMPRESSION: No  active cardiopulmonary disease. Electronically Signed    By: Marijo Conception M.D.   On: 06/02/2019 15:59   Dg Chest Port 1 View  Result Date: 05/13/2019 CLINICAL DATA:  Fever, body aches, blurred vision, COPD, hypertension EXAM: PORTABLE CHEST 1 VIEW COMPARISON:  Portable exam 1304 hours compared to 12/18/2017 FINDINGS: Upper normal size of cardiac silhouette. Tortuous aorta. Mediastinal contours and pulmonary vascularity otherwise normal. Lungs clear. No acute infiltrate, pleural effusion or pneumothorax. Osseous structures unremarkable. IMPRESSION: No acute abnormalities. Electronically Signed   By: Lavonia Dana M.D.   On: 05/13/2019 13:27       Assessment & Plan:   DAWAUN PASE is a 68 y.o. male Preoperative evaluation to rule out surgical contraindication - Plan: CBC, DG Chest 2 View, EKG 12-Lead  -Appears to be able to achieve at least 6 metabolic equivalents without difficulty. EKG with first degree block, but no apparent acute findings otherwise.   Chest x-ray obtained, labs as above, will review preop form and complete depending on results.  Essential hypertension - Plan: amLODipine (NORVASC) 5 MG tablet, EKG 12-Lead  -Blood pressure is now stable.  Continue amlodipine 7.5 mg daily.  Recheck 6 weeks  Nonintractable episodic headache, unspecified headache type   -Improving, related to blood pressure, but also recommended increased dosing of steroid nasal spray as allergy component likely.  RTC precautions if worsening.    Meds ordered this encounter  Medications  . amLODipine (NORVASC) 5 MG tablet    Sig: Take 1 tablet (5 mg total) by mouth daily.    Dispense:  90 tablet    Refill:  0   Patient Instructions    7.5 mg of amlodipine appears to be a good dose.  Continue same.  Can use up to 2 sprays in each nostril nasal spray to see if that also helps with headaches.  If headaches worsen please return for recheck.  I will complete your preoperative paperwork once I review some labs and chest x-ray report.  Follow-up with me in  6 weeks, sooner if needed.   If you have lab work done today you will be contacted with your lab results within the next 2 weeks.  If you have not heard from [place] then please contact us. The fastest way to get your results is to register for My Chart.   IF you received an x-ray today, you will receive an invoice from [facility]. Please contact [facility] at [phone] with questions or concerns regarding your invoice.   IF you received labwork today, you will receive an invoice from [place]. Please contact [facility] at [phone] with questions or concerns regarding your invoice.   Our billing staff will not be able to assist you with questions regarding bills from these companies.  You will be contacted with the lab results as soon as they are available. The fastest way to get your results is to activate your My Chart account. Instructions are located on the last page of this paperwork. If you have not heard from [place] regarding the results in 2 weeks, please contact this office.       Signed,   [name], MD [facility].  06/03/19 1:12 PM

## 2019-06-02 NOTE — H&P (Signed)
TOTAL KNEE ADMISSION H&P  Patient is being admitted for left total knee arthroplasty.  Subjective:  Chief Complaint:  Left knee primary OA / pain  HPI: [name], 68 y.o. male, has a history of pain and functional disability in the left knee due to arthritis and has failed non-surgical conservative treatments for greater than 12 weeks to includeNSAID's and/or analgesics, corticosteriod injections and activity modification.  Onset of symptoms was gradual, starting 1+ years ago with gradually worsening course since that time. The patient noted prior procedures on the knee to include  arthroscopy on the left knee(s).  Patient currently rates pain in the left knee(s) at 8 out of 10 with activity. Patient has night pain, worsening of pain with activity and weight bearing, pain that interferes with activities of daily living, pain with passive range of motion, crepitus and joint swelling.  Patient has evidence of periarticular osteophytes and joint space narrowing by imaging studies.  There is no active infection.  Risks, benefits and expectations were discussed with the patient.  Risks including but not limited to the risk of anesthesia, blood clots, nerve damage, blood vessel damage, failure of the prosthesis, infection and up to and including death.  Patient understand the risks, benefits and expectations and wishes to proceed with surgery.   PCP: [name], MD  D/C Plans:       Home   Post-op Meds:       No Rx given   Tranexamic Acid:      To be given - IV   Decadron:      Is to be given  FYI:     ASA  Norco  DME:   Pt equipment rx sent for - RW & 3-n-1  PT:   OPPT    Pharmacy: [facility]     Patient Active Problem List   Diagnosis Date Noted  . Allergic rhinitis 03/12/2017  . Thrush, oral 02/27/2017  . SUI (stress urinary incontinence), male 03/20/2016  . Prostate cancer ([place]) 02/09/2016  . Obese 09/08/2015  . S/P right TKA 09/06/2015  . S/P knee  replacement 09/06/2015  . COPD, mild ([place]) 07/20/2014  . Pulmonary nodules 02/23/2014  . Tobacco use disorder, moderate, in sustained remission 02/23/2014  . Recurrent right inguinal hernia 10/20/2013  . ED (erectile dysfunction) 08/26/2012  . Physical exam, annual 11/05/2011  . HTN (hypertension) 11/05/2011  . GERD (gastroesophageal reflux disease) 11/05/2011  . Chronic joint pain 11/05/2011   Past Medical History:  Diagnosis Date  . Allergy    seasonal  . Arthritis    oa  . Asthma   . COPD (chronic obstructive pulmonary disease) ([place])   . GERD (gastroesophageal reflux disease)   . Hypertension   . Prostate cancer ([place]) 01/2016   prostatectomy   . Shortness of breath dyspnea     Past Surgical History:  Procedure Laterality Date  . HERNIA REPAIR  last done 3-4 yrs ago   x 2  . INGUINAL HERNIA REPAIR Right 11/20/2013   Procedure: OPEN REPAIR OF RECURRENT RIGHT INGUINAL HERNIA WITH MESH;  Surgeon: [name]. [name], MD;  [facility];  Service: General;  Laterality: Right;  . INSERTION OF MESH Right 11/20/2013   Procedure: INSERTION OF MESH;  Surgeon: [name]. [name], MD;  [facility];  Service: General;  Laterality: Right;  . KNEE SURGERY Left july 2014   mcl and meniscus repair  . LYMPHADENECTOMY Bilateral 02/09/2016   Procedure: PELVIC LYMPHADENECTOMY;  Surgeon: Hubbard Robinson  [name], MD;  [facility];  Service: Urology;  Laterality: Bilateral;  . right knee arthroscopy     . ROBOT ASSISTED LAPAROSCOPIC RADICAL PROSTATECTOMY N/A 02/09/2016   Procedure: XI ROBOTIC ASSISTED LAPAROSCOPIC RADICAL PROSTATECTOMY WITH INDOCYANINE GREEN DYE AND ADHESIOLYSIS;  Surgeon: [name], MD;  [facility];  Service: Urology;  Laterality: N/A;  . TOTAL KNEE ARTHROPLASTY Right 09/06/2015   Procedure: RIGHT TOTAL KNEE ARTHROPLASTY;  Surgeon: [name], MD;  Location[facility];  Service: Orthopedics;  Laterality: Right;    No current facility-administered medications for this  encounter.    Current Outpatient Medications  Medication Sig Dispense Refill Last Dose  . albuterol (PROVENTIL HFA;VENTOLIN HFA) 108 (90 Base) MCG/ACT inhaler Inhale 2 puffs into the lungs every 6 (six) hours as needed for wheezing or shortness of breath. (Patient not taking: Reported on 05/19/2019) 1 Inhaler 6 Not Taking  . amLODipine (NORVASC) 2.5 MG tablet Take 1 tablet (2.5 mg total) by mouth daily. 30 tablet 1   . amLODipine (NORVASC) 5 MG tablet TAKE ONE TABLET BY MOUTH ONE TIME DAILY  90 tablet 0 Taking  . azelastine (OPTIVAR) 0.05 % ophthalmic solution Place 1 drop into both eyes 2 (two) times daily as needed. For itching/allergy symptoms 6 mL 1 Taking  . cyclobenzaprine (FLEXERIL) 5 MG tablet take one tablet by mouth three times daily as needed for muscle spasm. start at bedtime due to sedation 15 tablet 0 Taking  . esomeprazole (NEXIUM) 40 MG capsule TAKE 1 CAPSULE BY MOUTH DAILY 30 capsule 2 Taking  . fluticasone (KLS ALLER-FLO) 50 MCG/ACT nasal spray Place 2 sprays into both nostrils daily. 16 g 5 Taking  . INCRUSE ELLIPTA 62.5 MCG/INH AEPB inhale 1 puff into lungs once daily 30 each 2 Taking  . KLS ALLERCLEAR 10 MG tablet TAKE 1 TABLET BY MOUTH DAILY 365 tablet 0 Taking  . KLS ESOMEPRAZOLE MAGNESIUM 20 MG capsule TAKE TWO CAPSULES BY MOUTH DAILY  84 capsule 0 Taking  . meloxicam (MOBIC) 7.5 MG tablet TAKE ONE TABLET BY MOUTH DAILY AS NEEDED  30 tablet 2 Taking  . Naphazoline-Pheniramine (OPCON-A) 0.027-0.315 % SOLN Apply 1 drop to eye 2 (two) times daily as needed (dry/irritated eyes).   Taking  . pravastatin (PRAVACHOL) 20 MG tablet Take 1 tablet (20 mg total) by mouth daily. 30 tablet 1   . triamcinolone cream (KENALOG) 0.1 % Apply 1 application topically 2 (two) times daily as needed. To affected areas only as needed. 30 g 0 Taking   Allergies  Allergen Reactions  . Percocet [Oxycodone-Acetaminophen] Other (See Comments)    Nausea-Doesn't work  . Amoxicillin Rash    Has patient  had a PCN reaction causing immediate rash, facial/tongue/throat swelling, SOB or lightheadedness with hypotension: No Has patient had a PCN reaction causing severe rash involving mucus membranes or skin necrosis: No Has patient had a PCN reaction that required hospitalization No Has patient had a PCN reaction occurring within the last 10 years: Yes- 2016 If all of the above answers are "NO", then may proceed with Cephalosporin use.     Social History   Tobacco Use  . Smoking status: Former Smoker    Packs/day: 1.50    Years: 20.00    Pack years: 30.00    Types: Cigarettes    Quit date: 11/05/1999    Years since quitting: 19.5  . Smokeless tobacco: Never Used  Substance Use Topics  . Alcohol use: No    Comment: last used 25 years  ago     Family History  Problem Relation Age of Onset  . Cancer Mother   . Cancer Father   . Cancer Sister   . Arthritis Sister   . Cancer Maternal Grandmother   . Heart disease Maternal Grandfather   . Stroke Maternal Grandfather      Review of Systems  Constitutional: Negative.   HENT: Negative.   Eyes: Negative.   Respiratory: Negative.   Cardiovascular: Negative.   Gastrointestinal: Positive for heartburn.  Genitourinary: Negative.   Musculoskeletal: Positive for joint pain.  Skin: Negative.   Neurological: Negative.   Endo/Heme/Allergies: Positive for environmental allergies.  Psychiatric/Behavioral: Negative.     Objective:  Physical Exam  Constitutional: He is oriented to person, place, and time. He appears well-developed.  HENT:  Head: Normocephalic.  Eyes: Pupils are equal, round, and reactive to light.  Neck: Neck supple. No JVD present. No tracheal deviation present. No thyromegaly present.  Cardiovascular: Normal rate, regular rhythm and intact distal pulses.  Respiratory: Effort normal and breath sounds normal. No respiratory distress. He has no wheezes.  GI: Soft. There is no abdominal tenderness. There is no guarding.   Musculoskeletal:     Left knee: He exhibits swelling and bony tenderness. He exhibits no ecchymosis, no deformity, no laceration and no erythema. Tenderness found.  Lymphadenopathy:    He has no cervical adenopathy.  Neurological: He is alert and oriented to person, place, and time.  Skin: Skin is warm and dry.  Psychiatric: He has a normal mood and affect.     Labs:  Estimated body mass index is 30.88 kg/m as calculated from the following:   Height as of 05/19/19: 5\' 11"  (1.803 m).   Weight as of 05/19/19: 100.4 kg.   Imaging Review Plain radiographs demonstrate severe degenerative joint disease of the left knee. The bone quality appears to be good for age and reported activity level.      Assessment/Plan:  End stage arthritis, left knee   The patient history, physical examination, clinical judgment of the provider and imaging studies are consistent with end stage degenerative joint disease of the left knee(s) and total knee arthroplasty is deemed medically necessary. The treatment options including medical management, injection therapy arthroscopy and arthroplasty were discussed at length. The risks and benefits of total knee arthroplasty were presented and reviewed. The risks due to aseptic loosening, infection, stiffness, patella tracking problems, thromboembolic complications and other imponderables were discussed. The patient acknowledged the explanation, agreed to proceed with the plan and consent was signed. Patient is being admitted for inpatient treatment for surgery, pain control, PT, OT, prophylactic antibiotics, VTE prophylaxis, progressive ambulation and ADL's and discharge planning. The patient is planning to be discharged home with home health services.     Patient's anticipated LOS is less than 2 midnights, meeting these requirements: - Lives within 1 hour of care - Has a competent adult at home to recover with post-op recover - NO history of  - Chronic pain  requiring opiods  - Diabetes  - Coronary Artery Disease  - Heart failure  - Heart attack  - Stroke  - DVT/VTE  - Cardiac arrhythmia  - Respiratory Failure/COPD  - Renal failure  - Anemia  - Advanced Liver disease    [place] P[place]. Jamelah Sitzer   PA-C  06/02/2019, 9:47 PM

## 2019-06-03 ENCOUNTER — Encounter: Payer: Self-pay | Admitting: Family Medicine

## 2019-06-03 LAB — CBC
Hematocrit: 51.5 % — ABNORMAL HIGH (ref 37.5–51.0)
Hemoglobin: 17.2 g/dL (ref 13.0–17.7)
MCH: 30.5 pg (ref 26.6–33.0)
MCHC: 33.4 g/dL (ref 31.5–35.7)
MCV: 91 fL (ref 79–97)
Platelets: 287 10*3/uL (ref 150–450)
RBC: 5.64 x10E6/uL (ref 4.14–5.80)
RDW: 12.9 % (ref 11.6–15.4)
WBC: 5.4 10*3/uL (ref 3.4–10.8)

## 2019-06-04 ENCOUNTER — Other Ambulatory Visit: Payer: Self-pay | Admitting: Family Medicine

## 2019-06-05 NOTE — Patient Instructions (Addendum)
DUE TO COVID-19 ONLY ONE VISITOR IS ALLOWED TO COME WITH YOU AND STAY IN THE WAITING ROOM ONLY DURING PRE OP AND PROCEDURE DAY OF SURGERY. THE 1 VISITOR MAY VISIT WITH YOU AFTER SURGERY IN YOUR PRIVATE ROOM DURING VISITING HOURS ONLY!  YOU NEED TO HAVE A COVID 19 TEST ON 06-13-2019 @ 2:45 PM, THIS TEST MUST BE DONE BEFORE SURGERY, COME  [place], [place] , [id number].  ([place]) ONCE YOUR COVID TEST IS COMPLETED, PLEASE BEGIN THE QUARANTINE INSTRUCTIONS AS OUTLINED IN YOUR HANDOUT.                [name]    Your procedure is scheduled on: 06-17-2019   Report to [facility] Main  Entrance    Report to [place] at 5:30AM    Call this number if you have problems the morning of surgery [phone]       Do not eat food After Midnight. YOU MAY HAVE CLEAR LIQUIDS FROM MIDNIGHT UNTIL 4:30AM. At 4:30AM Please finish the prescribed Pre-Surgery ENSURE drink. Nothing by mouth after you finish the ENSURE drink !   CLEAR LIQUID DIET   Foods Allowed                                                                     Foods Excluded  Coffee and tea, regular and decaf                             liquids that you cannot  Plain Jell-O any favor except red or purple                                           see through such as: Fruit ices (not with fruit pulp)                                     milk, soups, orange juice  Iced Popsicles                                    All solid food Carbonated beverages, regular and diet                                    Cranberry, grape and apple juices Sports drinks like Gatorade Lightly seasoned clear broth or consume(fat free) Sugar, honey syrup   _____________________________________________________________________     Take these medicines the morning of surgery with A SIP OF WATER: AMLODPINE, PRAVASTATIN, FLUTICASONE, ELLIPTA INHALER, ALBUTEROL INHALER, EYEDROPS, LORATADINE        BRUSH YOUR TEETH  MORNING OF SURGERY AND RINSE YOUR MOUTH OUT, NO CHEWING GUM CANDY OR MINTS.                          You may not have any metal on  your body including hair pins and              piercings     Do not wear jewelry, cologne, lotions, powders or perfumes, deodorant                      Men may shave face and neck.   Do not bring valuables to the hospital. [place].   Contacts, dentures or bridgework may not be worn into surgery.   YOU MAY BRING A SMALL OVERNIGHT BAG             Please read over the following fact sheets you were given: _____________________________________________________________________             [facility] - Preparing for Surgery Before surgery, you can play an important role.  Because skin is not sterile, your skin needs to be as free of germs as possible.  You can reduce the number of germs on your skin by washing with CHG (chlorahexidine gluconate) soap before surgery.  CHG is an antiseptic cleaner which kills germs and bonds with the skin to continue killing germs even after washing. Please DO NOT use if you have an allergy to CHG or antibacterial soaps.  If your skin becomes reddened/irritated stop using the CHG and inform your nurse when you arrive at Short Stay. Do not shave (including legs and underarms) for at least 48 hours prior to the first CHG shower.  You may shave your face/neck. Please follow these instructions carefully:  1.  Shower with CHG Soap the night before surgery and the  morning of Surgery.  2.  If you choose to wash your hair, wash your hair first as usual with your  normal  shampoo.  3.  After you shampoo, rinse your hair and body thoroughly to remove the  shampoo.                           4.  Use CHG as you would any other liquid soap.  You can apply chg directly  to the skin and wash                       Gently with a scrungie or clean washcloth.  5.  Apply the CHG Soap to your body ONLY  FROM THE NECK DOWN.   Do not use on face/ open                           Wound or open sores. Avoid contact with eyes, ears mouth and genitals (private parts).                       Wash face,  Genitals (private parts) with your normal soap.             6.  Wash thoroughly, paying special attention to the area where your surgery  will be performed.  7.  Thoroughly rinse your body with warm water from the neck down.  8.  DO NOT shower/wash with your normal soap after using and rinsing off  the CHG Soap.                9.  Pat yourself dry with a clean towel.  10.  Wear clean pajamas.            11.  Place clean sheets on your bed the night of your first shower and do not  sleep with pets. Day of Surgery : Do not apply any lotions/deodorants the morning of surgery.  Please wear clean clothes to the hospital/surgery center.  FAILURE TO FOLLOW THESE INSTRUCTIONS MAY RESULT IN THE CANCELLATION OF YOUR SURGERY PATIENT SIGNATURE_________________________________  NURSE SIGNATURE__________________________________  ________________________________________________________________________   [name]  An incentive spirometer is a tool that can help keep your lungs clear and active. This tool measures how well you are filling your lungs with each breath. Taking long deep breaths may help reverse or decrease the chance of developing breathing (pulmonary) problems (especially infection) following:  A long period of time when you are unable to move or be active. BEFORE THE PROCEDURE   If the spirometer includes an indicator to show your best effort, your nurse or respiratory therapist will set it to a desired goal.  If possible, sit up straight or lean slightly forward. Try not to slouch.  Hold the incentive spirometer in an upright position. INSTRUCTIONS FOR USE  1. Sit on the edge of your bed if possible, or sit up as far as you can in bed or on a chair. 2. Hold the incentive  spirometer in an upright position. 3. Breathe out normally. 4. Place the mouthpiece in your mouth and seal your lips tightly around it. 5. Breathe in slowly and as deeply as possible, raising the piston or the ball toward the top of the column. 6. Hold your breath for 3-5 seconds or for as long as possible. Allow the piston or ball to fall to the bottom of the column. 7. Remove the mouthpiece from your mouth and breathe out normally. 8. Rest for a few seconds and repeat Steps 1 through 7 at least 10 times every 1-2 hours when you are awake. Take your time and take a few normal breaths between deep breaths. 9. The spirometer may include an indicator to show your best effort. Use the indicator as a goal to work toward during each repetition. 10. After each set of 10 deep breaths, practice coughing to be sure your lungs are clear. If you have an incision (the cut made at the time of surgery), support your incision when coughing by placing a pillow or rolled up towels firmly against it. Once you are able to get out of bed, walk around indoors and cough well. You may stop using the incentive spirometer when instructed by your caregiver.  RISKS AND COMPLICATIONS  Take your time so you do not get dizzy or light-headed.  If you are in pain, you may need to take or ask for pain medication before doing incentive spirometry. It is harder to take a deep breath if you are having pain. AFTER USE  Rest and breathe slowly and easily.  It can be helpful to keep track of a log of your progress. Your caregiver can provide you with a simple table to help with this. If you are using the spirometer at home, follow these instructions: [place] IF:   You are having difficultly using the spirometer.  You have trouble using the spirometer as often as instructed.  Your pain medication is not giving enough relief while using the spirometer.  You develop fever of 100.5 F (38.1 C) or higher. SEEK  IMMEDIATE MEDICAL CARE IF:   You cough up bloody  sputum that had not been present before.  You develop fever of 102 F (38.9 C) or greater.  You develop worsening pain at or near the incision site. MAKE SURE YOU:   Understand these instructions.  Will watch your condition.  Will get help right away if you are not doing well or get worse. Document Released: 01/15/2007 Document Revised: 11/27/2011 Document Reviewed: 03/18/2007 ExitCare Patient Information 2014 ExitCare, [place].   ________________________________________________________________________  WHAT IS A BLOOD TRANSFUSION? Blood Transfusion Information  A transfusion is the replacement of blood or some of its parts. Blood is made up of multiple cells which provide different functions.  Red blood cells carry oxygen and are used for blood loss replacement.  White blood cells fight against infection.  Platelets control bleeding.  Plasma helps clot blood.  Other blood products are available for specialized needs, such as hemophilia or other clotting disorders. BEFORE THE TRANSFUSION  Who gives blood for transfusions?   Healthy volunteers who are fully evaluated to make sure their blood is safe. This is blood bank blood. Transfusion therapy is the safest it has ever been in the practice of medicine. Before blood is taken from a donor, a complete history is taken to make sure that person has no history of diseases nor engages in risky social behavior (examples are intravenous drug use or sexual activity with multiple partners). The donor's travel history is screened to minimize risk of transmitting infections, such as malaria. The donated blood is tested for signs of infectious diseases, such as HIV and hepatitis. The blood is then tested to be sure it is compatible with you in order to minimize the chance of a transfusion reaction. If you or a relative donates blood, this is often done in anticipation of surgery and is not  appropriate for emergency situations. It takes many days to process the donated blood. RISKS AND COMPLICATIONS Although transfusion therapy is very safe and saves many lives, the main dangers of transfusion include:   Getting an infectious disease.  Developing a transfusion reaction. This is an allergic reaction to something in the blood you were given. Every precaution is taken to prevent this. The decision to have a blood transfusion has been considered carefully by your caregiver before blood is given. Blood is not given unless the benefits outweigh the risks. AFTER THE TRANSFUSION  Right after receiving a blood transfusion, you will usually feel much better and more energetic. This is especially true if your red blood cells have gotten low (anemic). The transfusion raises the level of the red blood cells which carry oxygen, and this usually causes an energy increase.  The nurse administering the transfusion will monitor you carefully for complications. HOME CARE INSTRUCTIONS  No special instructions are needed after a transfusion. You may find your energy is better. Speak with your caregiver about any limitations on activity for underlying diseases you may have. SEEK MEDICAL CARE IF:   Your condition is not improving after your transfusion.  You develop redness or irritation at the intravenous (IV) site. SEEK IMMEDIATE MEDICAL CARE IF:  Any of the following symptoms occur over the next 12 hours:  Shaking chills.  You have a temperature by mouth above 102 F (38.9 C), not controlled by medicine.  Chest, back, or muscle pain.  People around you feel you are not acting correctly or are confused.  Shortness of breath or difficulty breathing.  Dizziness and fainting.  You get a rash or develop hives.  You have a decrease  in urine output.  Your urine turns a dark color or changes to pink, red, or brown. Any of the following symptoms occur over the next 10 days:  You have a  temperature by mouth above 102 F (38.9 C), not controlled by medicine.  Shortness of breath.  Weakness after normal activity.  The white part of the eye turns yellow (jaundice).  You have a decrease in the amount of urine or are urinating less often.  Your urine turns a dark color or changes to pink, red, or brown. Document Released: 09/01/2000 Document Revised: 11/27/2011 Document Reviewed: 04/20/2008 [employer] [facility] [employer] [facility] Patient Information 2014 [place], [place].  _______________________________________________________________________

## 2019-06-09 ENCOUNTER — Encounter (HOSPITAL_COMMUNITY): Payer: Self-pay

## 2019-06-09 ENCOUNTER — Encounter (HOSPITAL_COMMUNITY)
Admission: RE | Admit: 2019-06-09 | Discharge: 2019-06-09 | Disposition: A | Discharge status: Home / Self Care | Payer: 59 | Source: Ambulatory Visit | Attending: Orthopedic Surgery | Admitting: Orthopedic Surgery

## 2019-06-09 ENCOUNTER — Other Ambulatory Visit: Payer: Self-pay

## 2019-06-09 DIAGNOSIS — Z01812 Encounter for preprocedural laboratory examination: Secondary | ICD-10-CM | POA: Insufficient documentation

## 2019-06-09 DIAGNOSIS — M25562 Pain in left knee: Secondary | ICD-10-CM | POA: Insufficient documentation

## 2019-06-09 DIAGNOSIS — M1712 Unilateral primary osteoarthritis, left knee: Secondary | ICD-10-CM | POA: Diagnosis not present

## 2019-06-09 LAB — CBC
HCT: 50.2 % (ref 39.0–52.0)
Hemoglobin: 16.1 g/dL (ref 13.0–17.0)
MCH: 30.1 pg (ref 26.0–34.0)
MCHC: 32.1 g/dL (ref 30.0–36.0)
MCV: 93.8 fL (ref 80.0–100.0)
Platelets: 248 10*3/uL (ref 150–400)
RBC: 5.35 MIL/uL (ref 4.22–5.81)
RDW: 12.4 % (ref 11.5–15.5)
WBC: 4.8 10*3/uL (ref 4.0–10.5)
nRBC: 0 % (ref 0.0–0.2)

## 2019-06-09 LAB — SURGICAL PCR SCREEN
MRSA, PCR: NEGATIVE
Staphylococcus aureus: NEGATIVE

## 2019-06-09 LAB — BASIC METABOLIC PANEL
Anion gap: 6 (ref 5–15)
BUN: 14 mg/dL (ref 8–23)
CO2: 26 mmol/L (ref 22–32)
Calcium: 9 mg/dL (ref 8.9–10.3)
Chloride: 106 mmol/L (ref 98–111)
Creatinine, Ser: 0.74 mg/dL (ref 0.61–1.24)
GFR calc Af Amer: >60 mL/min (ref >60)
GFR calc non Af Amer: >60 mL/min (ref >60)
Glucose, Bld: 108 mg/dL — ABNORMAL HIGH (ref 70–99)
Potassium: 4 mmol/L (ref 3.5–5.1)
Sodium: 138 mmol/L (ref 135–145)

## 2019-06-13 ENCOUNTER — Other Ambulatory Visit (HOSPITAL_COMMUNITY)
Admission: RE | Admit: 2019-06-13 | Discharge: 2019-06-13 | Disposition: A | Discharge status: Home / Self Care | Payer: 59 | Source: Ambulatory Visit | Attending: Orthopedic Surgery | Admitting: Orthopedic Surgery

## 2019-06-13 DIAGNOSIS — Z01812 Encounter for preprocedural laboratory examination: Secondary | ICD-10-CM | POA: Diagnosis present

## 2019-06-13 DIAGNOSIS — Z20828 Contact with and (suspected) exposure to other viral communicable diseases: Secondary | ICD-10-CM | POA: Diagnosis not present

## 2019-06-14 LAB — NOVEL CORONAVIRUS, NAA (HOSP ORDER, SEND-OUT TO REF LAB; TAT 18-24 HRS): SARS-CoV-2, NAA: NOT DETECTED

## 2019-06-17 ENCOUNTER — Ambulatory Visit (HOSPITAL_COMMUNITY): Payer: 59 | Admitting: Anesthesiology

## 2019-06-17 ENCOUNTER — Observation Stay (HOSPITAL_COMMUNITY)
Admission: RE | Admit: 2019-06-17 | Discharge: 2019-06-18 | Disposition: A | Discharge status: Home / Self Care | Payer: 59 | Attending: Orthopedic Surgery | Admitting: Orthopedic Surgery

## 2019-06-17 ENCOUNTER — Ambulatory Visit (HOSPITAL_COMMUNITY): Payer: 59 | Admitting: Physician Assistant

## 2019-06-17 ENCOUNTER — Encounter (HOSPITAL_COMMUNITY): Payer: Self-pay | Admitting: *Deleted

## 2019-06-17 ENCOUNTER — Other Ambulatory Visit: Payer: Self-pay

## 2019-06-17 ENCOUNTER — Encounter (HOSPITAL_COMMUNITY)
Admission: RE | Disposition: A | Discharge status: Home / Self Care | Payer: Self-pay | Source: Home / Self Care | Attending: Orthopedic Surgery

## 2019-06-17 DIAGNOSIS — I10 Essential (primary) hypertension: Secondary | ICD-10-CM | POA: Insufficient documentation

## 2019-06-17 DIAGNOSIS — Z79899 Other long term (current) drug therapy: Secondary | ICD-10-CM | POA: Diagnosis not present

## 2019-06-17 DIAGNOSIS — Z8546 Personal history of malignant neoplasm of prostate: Secondary | ICD-10-CM | POA: Diagnosis not present

## 2019-06-17 DIAGNOSIS — M1712 Unilateral primary osteoarthritis, left knee: Secondary | ICD-10-CM | POA: Diagnosis present

## 2019-06-17 DIAGNOSIS — M25762 Osteophyte, left knee: Secondary | ICD-10-CM | POA: Diagnosis not present

## 2019-06-17 DIAGNOSIS — K219 Gastro-esophageal reflux disease without esophagitis: Secondary | ICD-10-CM | POA: Insufficient documentation

## 2019-06-17 DIAGNOSIS — Z96651 Presence of right artificial knee joint: Secondary | ICD-10-CM | POA: Insufficient documentation

## 2019-06-17 DIAGNOSIS — Z87891 Personal history of nicotine dependence: Secondary | ICD-10-CM | POA: Insufficient documentation

## 2019-06-17 DIAGNOSIS — J449 Chronic obstructive pulmonary disease, unspecified: Secondary | ICD-10-CM | POA: Diagnosis not present

## 2019-06-17 DIAGNOSIS — Z96652 Presence of left artificial knee joint: Secondary | ICD-10-CM

## 2019-06-17 HISTORY — PX: TOTAL KNEE ARTHROPLASTY: SHX125

## 2019-06-17 LAB — TYPE AND SCREEN
ABO/RH(D): O POS
Antibody Screen: NEGATIVE

## 2019-06-17 SURGERY — ARTHROPLASTY, KNEE, TOTAL
Anesthesia: Spinal | Site: Knee | Laterality: Left

## 2019-06-17 MED ORDER — CEFAZOLIN SODIUM-DEXTROSE 2-4 GM/100ML-% IV SOLN
2.0000 g | Freq: Four times a day (QID) | INTRAVENOUS | Status: AC
  Administered 2019-06-17 (×2): 2 g via INTRAVENOUS
  Filled 2019-06-17 (×2): qty 100

## 2019-06-17 MED ORDER — HYDROCODONE-ACETAMINOPHEN 7.5-325 MG PO TABS
1.0000 | ORAL_TABLET | ORAL | Status: DC | PRN
  Administered 2019-06-17 – 2019-06-18 (×4): 2 via ORAL
  Filled 2019-06-17 (×4): qty 2

## 2019-06-17 MED ORDER — KETOROLAC TROMETHAMINE 30 MG/ML IJ SOLN
INTRAMUSCULAR | Status: AC
  Filled 2019-06-17: qty 1

## 2019-06-17 MED ORDER — DEXAMETHASONE SODIUM PHOSPHATE 10 MG/ML IJ SOLN: 10.0000 mg | Freq: Once | INTRAMUSCULAR | Status: DC

## 2019-06-17 MED ORDER — PROPOFOL 500 MG/50ML IV EMUL
INTRAVENOUS | Status: DC | PRN
  Administered 2019-06-17: 100 ug/kg/min via INTRAVENOUS

## 2019-06-17 MED ORDER — MAGNESIUM CITRATE PO SOLN: 1.0000 | Freq: Once | ORAL | Status: DC | PRN

## 2019-06-17 MED ORDER — MIDAZOLAM HCL 5 MG/5ML IJ SOLN
INTRAMUSCULAR | Status: DC | PRN
  Administered 2019-06-17: 2 mg via INTRAVENOUS

## 2019-06-17 MED ORDER — CELECOXIB 200 MG PO CAPS
200.0000 mg | ORAL_CAPSULE | Freq: Two times a day (BID) | ORAL | Status: DC
  Administered 2019-06-17 – 2019-06-18 (×2): 200 mg via ORAL
  Filled 2019-06-17 (×3): qty 1

## 2019-06-17 MED ORDER — TRANEXAMIC ACID-NACL 1000-0.7 MG/100ML-% IV SOLN
1000.0000 mg | INTRAVENOUS | Status: AC
  Administered 2019-06-17: 1000 mg via INTRAVENOUS
  Filled 2019-06-17: qty 100

## 2019-06-17 MED ORDER — STERILE WATER FOR IRRIGATION IR SOLN
Status: DC | PRN
  Administered 2019-06-17: 2000 mL

## 2019-06-17 MED ORDER — FENTANYL CITRATE (PF) 100 MCG/2ML IJ SOLN: 25.0000 ug | INTRAMUSCULAR | Status: DC | PRN

## 2019-06-17 MED ORDER — NAPHAZOLINE-PHENIRAMINE 0.027-0.315 % OP SOLN: 1.0000 [drp] | Freq: Two times a day (BID) | OPHTHALMIC | Status: DC | PRN

## 2019-06-17 MED ORDER — CHLORHEXIDINE GLUCONATE 4 % EX LIQD: 60.0000 mL | Freq: Once | CUTANEOUS | Status: DC

## 2019-06-17 MED ORDER — DIPHENHYDRAMINE HCL 12.5 MG/5ML PO ELIX: 12.5000 mg | ORAL_SOLUTION | ORAL | Status: DC | PRN

## 2019-06-17 MED ORDER — ONDANSETRON HCL 4 MG/2ML IJ SOLN
INTRAMUSCULAR | Status: AC
  Filled 2019-06-17: qty 2

## 2019-06-17 MED ORDER — ONDANSETRON HCL 4 MG/2ML IJ SOLN
INTRAMUSCULAR | Status: DC | PRN
  Administered 2019-06-17: 4 mg via INTRAVENOUS

## 2019-06-17 MED ORDER — MENTHOL 3 MG MT LOZG: 1.0000 | LOZENGE | OROMUCOSAL | Status: DC | PRN

## 2019-06-17 MED ORDER — AMLODIPINE BESYLATE 5 MG PO TABS
7.5000 mg | ORAL_TABLET | Freq: Every day | ORAL | Status: DC
  Administered 2019-06-18: 10:00:00 7.5 mg via ORAL
  Filled 2019-06-17: qty 2

## 2019-06-17 MED ORDER — ACETAMINOPHEN 325 MG PO TABS: 325.0000 mg | ORAL_TABLET | Freq: Four times a day (QID) | ORAL | Status: DC | PRN

## 2019-06-17 MED ORDER — ALBUTEROL SULFATE (2.5 MG/3ML) 0.083% IN NEBU: 2.5000 mg | INHALATION_SOLUTION | Freq: Four times a day (QID) | RESPIRATORY_TRACT | Status: DC | PRN

## 2019-06-17 MED ORDER — LORATADINE 10 MG PO TABS: 10.0000 mg | ORAL_TABLET | Freq: Every day | ORAL | Status: DC | PRN

## 2019-06-17 MED ORDER — MORPHINE SULFATE (PF) 2 MG/ML IV SOLN
0.5000 mg | INTRAVENOUS | Status: DC | PRN
  Administered 2019-06-17: 1 mg via INTRAVENOUS
  Filled 2019-06-17: qty 1

## 2019-06-17 MED ORDER — KETOTIFEN FUMARATE 0.025 % OP SOLN: 1.0000 [drp] | Freq: Two times a day (BID) | OPHTHALMIC | Status: DC | PRN

## 2019-06-17 MED ORDER — AMLODIPINE BESYLATE 5 MG PO TABS: 2.5000 mg | ORAL_TABLET | Freq: Every day | ORAL | Status: DC

## 2019-06-17 MED ORDER — SODIUM CHLORIDE (PF) 0.9 % IJ SOLN
INTRAMUSCULAR | Status: DC | PRN
  Administered 2019-06-17: 30 mL

## 2019-06-17 MED ORDER — ALUM & MAG HYDROXIDE-SIMETH 200-200-20 MG/5ML PO SUSP
15.0000 mL | ORAL | Status: DC | PRN
  Administered 2019-06-18: 15 mL via ORAL
  Filled 2019-06-17: qty 30

## 2019-06-17 MED ORDER — POLYETHYLENE GLYCOL 3350 17 G PO PACK
17.0000 g | PACK | Freq: Two times a day (BID) | ORAL | Status: DC
  Administered 2019-06-17 – 2019-06-18 (×2): 17 g via ORAL
  Filled 2019-06-17 (×2): qty 1

## 2019-06-17 MED ORDER — ONDANSETRON HCL 4 MG/2ML IJ SOLN: 4.0000 mg | Freq: Once | INTRAMUSCULAR | Status: DC | PRN

## 2019-06-17 MED ORDER — FENTANYL CITRATE (PF) 100 MCG/2ML IJ SOLN
INTRAMUSCULAR | Status: DC | PRN
  Administered 2019-06-17 (×2): 50 ug via INTRAVENOUS

## 2019-06-17 MED ORDER — ASPIRIN 81 MG PO CHEW
81.0000 mg | CHEWABLE_TABLET | Freq: Two times a day (BID) | ORAL | Status: DC
  Administered 2019-06-17 – 2019-06-18 (×2): 81 mg via ORAL
  Filled 2019-06-17 (×2): qty 1

## 2019-06-17 MED ORDER — PRAVASTATIN SODIUM 20 MG PO TABS
20.0000 mg | ORAL_TABLET | Freq: Every day | ORAL | Status: DC
  Administered 2019-06-18: 20 mg via ORAL
  Filled 2019-06-17: qty 1

## 2019-06-17 MED ORDER — 0.9 % SODIUM CHLORIDE (POUR BTL) OPTIME
TOPICAL | Status: DC | PRN
  Administered 2019-06-17: 1000 mL

## 2019-06-17 MED ORDER — PANTOPRAZOLE SODIUM 40 MG PO TBEC
40.0000 mg | DELAYED_RELEASE_TABLET | Freq: Every day | ORAL | Status: DC
  Administered 2019-06-17 – 2019-06-18 (×2): 40 mg via ORAL
  Filled 2019-06-17 (×2): qty 1

## 2019-06-17 MED ORDER — UMECLIDINIUM BROMIDE 62.5 MCG/INH IN AEPB
1.0000 | INHALATION_SPRAY | Freq: Every day | RESPIRATORY_TRACT | Status: DC
  Administered 2019-06-18: 08:00:00 1 via RESPIRATORY_TRACT
  Filled 2019-06-17: qty 7

## 2019-06-17 MED ORDER — LACTATED RINGERS IV SOLN
INTRAVENOUS | Status: DC
  Administered 2019-06-17 (×2): via INTRAVENOUS

## 2019-06-17 MED ORDER — DEXAMETHASONE SODIUM PHOSPHATE 10 MG/ML IJ SOLN
INTRAMUSCULAR | Status: DC | PRN
  Administered 2019-06-17: 10 mg via INTRAVENOUS

## 2019-06-17 MED ORDER — BUPIVACAINE HCL (PF) 0.25 % IJ SOLN
INTRAMUSCULAR | Status: AC
  Filled 2019-06-17: qty 30

## 2019-06-17 MED ORDER — FENTANYL CITRATE (PF) 100 MCG/2ML IJ SOLN
INTRAMUSCULAR | Status: AC
  Filled 2019-06-17: qty 2

## 2019-06-17 MED ORDER — METHOCARBAMOL 500 MG IVPB - SIMPLE MED
INTRAVENOUS | Status: AC
  Administered 2019-06-17: 500 mg
  Filled 2019-06-17: qty 50

## 2019-06-17 MED ORDER — METOCLOPRAMIDE HCL 5 MG PO TABS: 5.0000 mg | ORAL_TABLET | Freq: Three times a day (TID) | ORAL | Status: DC | PRN

## 2019-06-17 MED ORDER — METHOCARBAMOL 500 MG PO TABS
500.0000 mg | ORAL_TABLET | Freq: Four times a day (QID) | ORAL | Status: DC | PRN
  Administered 2019-06-17 – 2019-06-18 (×3): 500 mg via ORAL
  Filled 2019-06-17 (×3): qty 1

## 2019-06-17 MED ORDER — PROPOFOL 10 MG/ML IV BOLUS
INTRAVENOUS | Status: AC
  Filled 2019-06-17: qty 60

## 2019-06-17 MED ORDER — PROPOFOL 10 MG/ML IV BOLUS
INTRAVENOUS | Status: DC | PRN
  Administered 2019-06-17 (×2): 20 mg via INTRAVENOUS

## 2019-06-17 MED ORDER — BISACODYL 10 MG RE SUPP: 10.0000 mg | Freq: Every day | RECTAL | Status: DC | PRN

## 2019-06-17 MED ORDER — DEXAMETHASONE SODIUM PHOSPHATE 10 MG/ML IJ SOLN
INTRAMUSCULAR | Status: AC
  Filled 2019-06-17: qty 1

## 2019-06-17 MED ORDER — HYDROCODONE-ACETAMINOPHEN 5-325 MG PO TABS
1.0000 | ORAL_TABLET | ORAL | Status: DC | PRN
  Administered 2019-06-17 (×2): 2 via ORAL
  Filled 2019-06-17 (×2): qty 2

## 2019-06-17 MED ORDER — TRANEXAMIC ACID-NACL 1000-0.7 MG/100ML-% IV SOLN
1000.0000 mg | Freq: Once | INTRAVENOUS | Status: AC
  Administered 2019-06-17: 1000 mg via INTRAVENOUS
  Filled 2019-06-17: qty 100

## 2019-06-17 MED ORDER — ONDANSETRON HCL 4 MG PO TABS: 4.0000 mg | ORAL_TABLET | Freq: Four times a day (QID) | ORAL | Status: DC | PRN

## 2019-06-17 MED ORDER — SODIUM CHLORIDE 0.9 % IV SOLN
INTRAVENOUS | Status: DC
  Administered 2019-06-17 – 2019-06-18 (×2): via INTRAVENOUS

## 2019-06-17 MED ORDER — POVIDONE-IODINE 10 % EX SWAB
2.0000 "application " | Freq: Once | CUTANEOUS | Status: AC
  Administered 2019-06-17: 2 via TOPICAL

## 2019-06-17 MED ORDER — DEXAMETHASONE SODIUM PHOSPHATE 10 MG/ML IJ SOLN
10.0000 mg | Freq: Once | INTRAMUSCULAR | Status: AC
  Administered 2019-06-18: 10 mg via INTRAVENOUS
  Filled 2019-06-17: qty 1

## 2019-06-17 MED ORDER — BUPIVACAINE HCL (PF) 0.25 % IJ SOLN
INTRAMUSCULAR | Status: DC | PRN
  Administered 2019-06-17: 30 mL

## 2019-06-17 MED ORDER — ONDANSETRON HCL 4 MG/2ML IJ SOLN: 4.0000 mg | Freq: Four times a day (QID) | INTRAMUSCULAR | Status: DC | PRN

## 2019-06-17 MED ORDER — KETOROLAC TROMETHAMINE 30 MG/ML IJ SOLN
INTRAMUSCULAR | Status: DC | PRN
  Administered 2019-06-17: 30 mg

## 2019-06-17 MED ORDER — PHENOL 1.4 % MT LIQD: 1.0000 | OROMUCOSAL | Status: DC | PRN

## 2019-06-17 MED ORDER — SODIUM CHLORIDE (PF) 0.9 % IJ SOLN
INTRAMUSCULAR | Status: AC
  Filled 2019-06-17: qty 50

## 2019-06-17 MED ORDER — METHOCARBAMOL 500 MG IVPB - SIMPLE MED
500.0000 mg | Freq: Four times a day (QID) | INTRAVENOUS | Status: DC | PRN
  Filled 2019-06-17: qty 50

## 2019-06-17 MED ORDER — FLUTICASONE PROPIONATE 50 MCG/ACT NA SUSP
2.0000 | Freq: Every day | NASAL | Status: DC
  Filled 2019-06-17: qty 16

## 2019-06-17 MED ORDER — FERROUS SULFATE 325 (65 FE) MG PO TABS
325.0000 mg | ORAL_TABLET | Freq: Two times a day (BID) | ORAL | Status: DC
  Administered 2019-06-17 – 2019-06-18 (×2): 325 mg via ORAL
  Filled 2019-06-17 (×2): qty 1

## 2019-06-17 MED ORDER — DOCUSATE SODIUM 100 MG PO CAPS
100.0000 mg | ORAL_CAPSULE | Freq: Two times a day (BID) | ORAL | Status: DC
  Administered 2019-06-17 – 2019-06-18 (×2): 100 mg via ORAL
  Filled 2019-06-17 (×2): qty 1

## 2019-06-17 MED ORDER — CEFAZOLIN SODIUM-DEXTROSE 2-4 GM/100ML-% IV SOLN
2.0000 g | INTRAVENOUS | Status: AC
  Administered 2019-06-17: 07:00:00 2 g via INTRAVENOUS
  Filled 2019-06-17: qty 100

## 2019-06-17 MED ORDER — METOCLOPRAMIDE HCL 5 MG/ML IJ SOLN: 5.0000 mg | Freq: Three times a day (TID) | INTRAMUSCULAR | Status: DC | PRN

## 2019-06-17 MED ORDER — MIDAZOLAM HCL 2 MG/2ML IJ SOLN
INTRAMUSCULAR | Status: AC
  Filled 2019-06-17: qty 2

## 2019-06-17 SURGICAL SUPPLY — 67 items
ADH SKN CLS APL DERMABOND .7 (GAUZE/BANDAGES/DRESSINGS) ×1
ATTUNE MED ANAT PAT 38 KNEE (Knees) ×1 IMPLANT
ATTUNE MED ANAT PAT 38MM KNEE (Knees) ×1 IMPLANT
ATTUNE PS FEM LT SZ 8 CEM KNEE (Femur) ×2 IMPLANT
ATTUNE PSRP INSR SZ8 6 KNEE (Insert) ×1 IMPLANT
ATTUNE PSRP INSR SZ8 6MM KNEE (Insert) ×1 IMPLANT
BAG SPEC THK2 15X12 ZIP CLS (MISCELLANEOUS)
BAG ZIPLOCK 12X15 (MISCELLANEOUS) IMPLANT
BASE TIBIAL ROT PLAT SZ 8 KNEE (Knees) ×1 IMPLANT
BLADE SAW SGTL 11.0X1.19X90.0M (BLADE) IMPLANT
BLADE SAW SGTL 13.0X1.19X90.0M (BLADE) ×3 IMPLANT
BLADE SURG SZ10 CARB STEEL (BLADE) ×6 IMPLANT
BNDG CMPR MED 10X6 ELC LF (GAUZE/BANDAGES/DRESSINGS) ×1
BNDG ELASTIC 6X10 VLCR STRL LF (GAUZE/BANDAGES/DRESSINGS) ×3 IMPLANT
BNDG ELASTIC 6X5.8 VLCR STR LF (GAUZE/BANDAGES/DRESSINGS) ×3 IMPLANT
BOWL SMART MIX CTS (DISPOSABLE) ×3 IMPLANT
BSPLAT TIB 8 CMNT ROT PLAT STR (Knees) ×1 IMPLANT
CEMENT HV SMART SET (Cement) ×4 IMPLANT
COVER SURGICAL LIGHT HANDLE (MISCELLANEOUS) ×3 IMPLANT
COVER WAND RF STERILE (DRAPES) IMPLANT
CUFF TOURN SGL QUICK 34 (TOURNIQUET CUFF) ×3
CUFF TRNQT CYL 34X4.125X (TOURNIQUET CUFF) ×1 IMPLANT
DECANTER SPIKE VIAL GLASS SM (MISCELLANEOUS) ×6 IMPLANT
DERMABOND ADVANCED (GAUZE/BANDAGES/DRESSINGS) ×2
DERMABOND ADVANCED .7 DNX12 (GAUZE/BANDAGES/DRESSINGS) ×1 IMPLANT
DRAPE U-SHAPE 47X51 STRL (DRAPES) ×3 IMPLANT
DRESSING AQUACEL AG SP 3.5X10 (GAUZE/BANDAGES/DRESSINGS) ×1 IMPLANT
DRSG AQUACEL AG ADV 3.5X10 (GAUZE/BANDAGES/DRESSINGS) ×2 IMPLANT
DRSG AQUACEL AG SP 3.5X10 (GAUZE/BANDAGES/DRESSINGS) ×3
DURAPREP 26ML APPLICATOR (WOUND CARE) ×6 IMPLANT
ELECT REM PT RETURN 15FT ADLT (MISCELLANEOUS) ×3 IMPLANT
GLOVE BIO SURGEON STRL SZ 6 (GLOVE) ×3 IMPLANT
GLOVE BIOGEL PI IND STRL 6.5 (GLOVE) ×1 IMPLANT
GLOVE BIOGEL PI IND STRL 7.5 (GLOVE) ×1 IMPLANT
GLOVE BIOGEL PI IND STRL 8.5 (GLOVE) ×1 IMPLANT
GLOVE BIOGEL PI INDICATOR 6.5 (GLOVE) ×2
GLOVE BIOGEL PI INDICATOR 7.5 (GLOVE) ×2
GLOVE BIOGEL PI INDICATOR 8.5 (GLOVE) ×2
GLOVE ECLIPSE 8.0 STRL XLNG CF (GLOVE) ×3 IMPLANT
GLOVE ORTHO TXT STRL SZ7.5 (GLOVE) ×3 IMPLANT
GOWN STRL REUS W/ TWL LRG LVL3 (GOWN DISPOSABLE) ×1 IMPLANT
GOWN STRL REUS W/TWL 2XL LVL3 (GOWN DISPOSABLE) ×3 IMPLANT
GOWN STRL REUS W/TWL LRG LVL3 (GOWN DISPOSABLE) ×6 IMPLANT
HANDPIECE INTERPULSE COAX TIP (DISPOSABLE) ×3
HOLDER FOLEY CATH W/STRAP (MISCELLANEOUS) IMPLANT
KIT TURNOVER KIT A (KITS) IMPLANT
MANIFOLD NEPTUNE II (INSTRUMENTS) ×3 IMPLANT
NDL SAFETY ECLIPSE 18X1.5 (NEEDLE) IMPLANT
NEEDLE HYPO 18GX1.5 SHARP (NEEDLE)
NS IRRIG 1000ML POUR BTL (IV SOLUTION) ×3 IMPLANT
PACK TOTAL KNEE CUSTOM (KITS) ×3 IMPLANT
PIN DRILL FIX HALF THREAD (BIT) ×2 IMPLANT
PIN FIX SIGMA LCS THRD HI (PIN) ×2 IMPLANT
PROTECTOR NERVE ULNAR (MISCELLANEOUS) ×3 IMPLANT
SET HNDPC FAN SPRY TIP SCT (DISPOSABLE) ×1 IMPLANT
SET PAD KNEE POSITIONER (MISCELLANEOUS) ×3 IMPLANT
SUT MNCRL AB 4-0 PS2 18 (SUTURE) ×3 IMPLANT
SUT STRATAFIX PDS+ 0 24IN (SUTURE) ×3 IMPLANT
SUT VIC AB 1 CT1 36 (SUTURE) ×3 IMPLANT
SUT VIC AB 2-0 CT1 27 (SUTURE) ×9
SUT VIC AB 2-0 CT1 TAPERPNT 27 (SUTURE) ×3 IMPLANT
SYR 3ML LL SCALE MARK (SYRINGE) ×3 IMPLANT
TIBIAL BASE ROT PLAT SZ 8 KNEE (Knees) ×3 IMPLANT
TRAY FOLEY MTR SLVR 16FR STAT (SET/KITS/TRAYS/PACK) ×3 IMPLANT
WATER STERILE IRR 1000ML POUR (IV SOLUTION) ×6 IMPLANT
WRAP KNEE MAXI GEL POST OP (GAUZE/BANDAGES/DRESSINGS) ×3 IMPLANT
YANKAUER SUCT BULB TIP 10FT TU (MISCELLANEOUS) ×5 IMPLANT

## 2019-06-17 NOTE — Transfer of Care (Signed)
Immediate Anesthesia Transfer of Care Note  Patient: [name]  Procedure(s) Performed: TOTAL KNEE ARTHROPLASTY (Left Knee)  Patient Location: PACU  Anesthesia Type:Spinal  Level of Consciousness: drowsy  Airway & Oxygen Therapy: Patient Spontanous Breathing and Patient connected to face mask oxygen  Post-op Assessment: Report given to RN and Post -op Vital signs reviewed and stable  Post vital signs: Reviewed and stable  Last Vitals:  Vitals Value Taken Time  BP 125/84 06/17/19 0921  Temp    Pulse 75 06/17/19 0922  Resp 18 06/17/19 0922  SpO2 97 % 06/17/19 0922  Vitals shown include unvalidated device data.  Last Pain:  Vitals:   06/17/19 0559  TempSrc:   PainSc: 0-No pain      Patients Stated Pain Goal: 3 ([id number])  Complications: No apparent anesthesia complications

## 2019-06-17 NOTE — Interval H&P Note (Signed)
History and Physical Interval Note:  06/17/2019 7:07 AM  [name]  has presented today for surgery, with the diagnosis of Left knee osteoarthritis.  The various methods of treatment have been discussed with the patient and family. After consideration of risks, benefits and other options for treatment, the patient has consented to  Procedure(s) with comments: TOTAL KNEE ARTHROPLASTY (Left) - 70 mins as a surgical intervention.  The patient's history has been reviewed, patient examined, no change in status, stable for surgery.  I have reviewed the patient's chart and labs.  Questions were answered to the patient's satisfaction.     Mauri Pole

## 2019-06-17 NOTE — Anesthesia Procedure Notes (Signed)
Anesthesia Regional Block: Adductor canal block   Pre-Anesthetic Checklist: ,, timeout performed, Correct Patient, Correct Site, Correct Laterality, Correct Procedure, Correct Position, site marked, Risks and benefits discussed, pre-op evaluation,  At surgeon's request and post-op pain management  Laterality: Left  Prep: Maximum Sterile Barrier Precautions used, chloraprep       Needles:  Injection technique: Single-shot  Needle Type: Echogenic Stimulator Needle     Needle Length: 9cm  Needle Gauge: 21     Additional Needles:   Procedures:,,,, ultrasound used (permanent image in chart),,,,  Narrative:  Start time: 06/17/2019 7:00 AM End time: 06/17/2019 7:10 AM Injection made incrementally with aspirations every 5 mL.  Performed by: Personally  Anesthesiologist: Roberts Gaudy, MD  Additional Notes: 20 cc 0.75% Ropivacaine injected easily

## 2019-06-17 NOTE — Anesthesia Procedure Notes (Signed)
Date/Time: 06/17/2019 7:17 AM Performed by: Sharlette Dense, CRNA Oxygen Delivery Method: Simple face mask

## 2019-06-17 NOTE — Discharge Instructions (Signed)

## 2019-06-17 NOTE — Anesthesia Postprocedure Evaluation (Signed)
Anesthesia Post Note  Patient: [name]  Procedure(s) Performed: TOTAL KNEE ARTHROPLASTY (Left Knee)     Patient location during evaluation: PACU Anesthesia Type: Spinal Level of consciousness: oriented and awake and alert Pain management: pain level controlled Vital Signs Assessment: post-procedure vital signs reviewed and stable Respiratory status: spontaneous breathing, respiratory function stable and patient connected to nasal cannula oxygen Cardiovascular status: blood pressure returned to baseline and stable Postop Assessment: no headache, no backache and no apparent nausea or vomiting Anesthetic complications: no    Last Vitals:  Vitals:   06/17/19 1030 06/17/19 1045  BP: 130/87 (!) 144/95  Pulse: 65 64  Resp: 10 17  Temp: 36.5 C 36.5 C  SpO2: 100%     Last Pain:  Vitals:   06/17/19 1058  TempSrc:   PainSc: 5                  Ruthy Forry COKER

## 2019-06-17 NOTE — Anesthesia Procedure Notes (Signed)
Spinal  Patient location during procedure: OR Start time: 06/17/2019 7:15 AM End time: 06/17/2019 7:20 AM Staffing Anesthesiologist: Roberts Gaudy, MD Performed: anesthesiologist  Preanesthetic Checklist Completed: patient identified, site marked, surgical consent, pre-op evaluation, timeout performed, IV checked, risks and benefits discussed and monitors and equipment checked Spinal Block Patient position: sitting Prep: DuraPrep Patient monitoring: heart rate, cardiac monitor, continuous pulse ox and blood pressure Approach: midline Location: L3-4 Injection technique: single-shot Needle Needle type: Sprotte and Pencan  Needle gauge: 24 G Needle length: 9 cm Assessment Sensory level: T4 Additional Notes 1.8 cc 0.75% Bupivacaine injected easily

## 2019-06-17 NOTE — Evaluation (Signed)
Physical Therapy Evaluation Patient Details Name: [name] MRN: [id number] DOB: 04-04-1951 Today's Date: 06/17/2019   History of Present Illness  Patient is 68 y.o male s/p Lt TKA on 06/17/19 with PMH significant for COPD, GERD, HTN, prostate cancer, and Rt TKA in 2016.    Clinical Impression  [name] is a 68 y.o. male POD 0 s/p Lt TKA. Patient reports independence with mobility at baseline. Patient is now limited by functional impairments (see PT problem list below) and requires min asist for transfers and gait with RW. Patient was able to ambulate ~80 feet with RW and minimal assist/cues for safe technique. Patient instructed in exercise to facilitate ROM and circulation to manage edema. Patient will benefit from continued skilled PT interventions to address impairments and progress towards PLOF. Acute PT will follow to progress mobility and stair training in preparation for safe discharge home.      Follow Up Recommendations Follow surgeon's recommendation for DC plan and follow-up therapies    Equipment Recommendations  Rolling walker with 5" wheels(educated pt and family on how/where to obtain shower seat)    Recommendations for Other Services       Precautions / Restrictions Precautions Precautions: Fall Restrictions Weight Bearing Restrictions: No LLE Weight Bearing: Weight bearing as tolerated      Mobility  Bed Mobility Overal bed mobility: Needs Assistance Bed Mobility: Supine to Sit     Supine to sit: Supervision     General bed mobility comments: pt requries cues to scoot to EOB, pt using bed rails with HOB slightly elevated  Transfers Overall transfer level: Needs assistance Equipment used: Rolling walker (2 wheeled) Transfers: Sit to/from Stand Sit to Stand: Min assist         General transfer comment: cues for hand placement and technique, min assist to initiate power up to rise  Ambulation/Gait Ambulation/Gait assistance: Min  assist Gait Distance (Feet): 80 Feet Assistive device: Rolling walker (2 wheeled) Gait Pattern/deviations: Step-through pattern;Decreased step length - right;Decreased stance time - left;Decreased stride length;Decreased step length - left Gait velocity: decreased   General Gait Details: cues for sfe hand placement initially and pt with good awareness throughout, cues to maintain safe proximety to RW during turns and intermittent assist to manage RW, no overt LOB  Stairs            Wheelchair Mobility    Modified Rankin (Stroke Patients Only)       Balance Overall balance assessment: Needs assistance Sitting-balance support: No upper extremity supported;Feet supported Sitting balance-Leahy Scale: Good     Standing balance support: During functional activity;Bilateral upper extremity supported Standing balance-Leahy Scale: Fair Standing balance comment: pt abel to maintain standing to pull catherter garter up his leg without UE support, requires support for gait          Pertinent Vitals/Pain Pain Assessment: Faces Faces Pain Scale: Hurts little more Pain Location: Lt knee Pain Descriptors / Indicators: Grimacing;Guarding Pain Intervention(s): Limited activity within patient's tolerance;Monitored during session;Ice applied    Home Living Family/patient expects to be discharged to:: Private residence Living Arrangements: Spouse/significant other Available Help at Discharge: Family;Available 24 hours/day Type of Home: House Home Access: Stairs to enter Entrance Stairs-Rails: None Entrance Stairs-Number of Steps: 1 step, landing, 1 more step, no rails at either Home Layout: Two level;Bed/bath upstairs Home Equipment: Cane - single point;Crutches Additional Comments: pt's wife is working from home and is off for next week to assist him as needed    Prior  Function Level of Independence: Independent         Comments: pt was mobilizing with no device     Hand  Dominance        Extremity/Trunk Assessment   Upper Extremity Assessment Upper Extremity Assessment: Overall WFL for tasks assessed    Lower Extremity Assessment Lower Extremity Assessment: LLE deficits/detail LLE Deficits / Details: pt with moderate quad set in supine, no extensor lag in SLR, pt light touch sensation appears intact    Cervical / Trunk Assessment Cervical / Trunk Assessment: Normal  Communication   Communication: No difficulties  Cognition Arousal/Alertness: Awake/alert Behavior During Therapy: WFL for tasks assessed/performed Overall Cognitive Status: Within Functional Limits for tasks assessed             General Comments      Exercises Total Joint Exercises Ankle Circles/Pumps: AROM;10 reps;Both;Seated Quad Sets: AROM;Supine;Seated;Left;10 reps(2 sets (1x5 supine, 1x5 seated))   Assessment/Plan    PT Assessment Patient needs continued PT services  PT Problem List Decreased strength;Decreased balance;Decreased range of motion;Decreased mobility;Decreased activity tolerance;Decreased knowledge of use of DME       PT Treatment Interventions DME instruction;Functional mobility training;Balance training;Patient/family education;Gait training;Therapeutic activities;Stair training;Therapeutic exercise    PT Goals (Current goals can be found in the Care Plan section)  Acute Rehab PT Goals Patient Stated Goal: to return home and get back to full independence, get back to work PT Goal Formulation: With patient Time For Goal Achievement: 06/23/19 Potential to Achieve Goals: Good    Frequency 7X/week    AM-PAC PT "6 Clicks" Mobility  Outcome Measure Help needed turning from your back to your side while in a flat bed without using bedrails?: A Little Help needed moving from lying on your back to sitting on the side of a flat bed without using bedrails?: A Little Help needed moving to and from a bed to a chair (including a wheelchair)?: A Little Help  needed standing up from a chair using your arms (e.g., wheelchair or bedside chair)?: A Little Help needed to walk in hospital room?: A Little Help needed climbing 3-5 steps with a railing? : A Little 6 Click Score: 18    End of Session Equipment Utilized During Treatment: Gait belt Activity Tolerance: Patient tolerated treatment well Patient left: in chair;with call bell/phone within reach;with chair alarm set Nurse Communication: Mobility status PT Visit Diagnosis: Unsteadiness on feet (R26.81);Difficulty in walking, not elsewhere classified (R26.2);Muscle weakness (generalized) (M62.81);Other abnormalities of gait and mobility (R26.89)    Time: [id number] PT Time Calculation (min) (ACUTE ONLY): 26 min   Charges:   PT Evaluation $PT Eval Low Complexity: 1 Low PT Treatments $Therapeutic Exercise: 8-22 mins      Kipp Brood, PT, DPT, [facility] Physical Therapist with [facility]  06/17/2019 4:02 PM

## 2019-06-17 NOTE — Anesthesia Procedure Notes (Signed)
Date/Time: 06/17/2019 6:50 AM Performed by: Sharlette Dense, CRNA Oxygen Delivery Method: Nasal cannula

## 2019-06-17 NOTE — Anesthesia Preprocedure Evaluation (Signed)
Anesthesia Evaluation  Patient identified by MRN, date of birth, ID band Patient awake    Reviewed: Allergy & Precautions, NPO status , Patient's Chart, lab work & pertinent test results  Airway Mallampati: II  TM Distance: >3 FB Neck ROM: Full    Dental  (+) Edentulous Upper, Dental Advisory Given   Pulmonary former smoker,    breath sounds clear to auscultation       Cardiovascular hypertension,  Rhythm:Regular Rate:Normal     Neuro/Psych    GI/Hepatic   Endo/Other    Renal/GU      Musculoskeletal   Abdominal   Peds  Hematology   Anesthesia Other Findings   Reproductive/Obstetrics                             Anesthesia Physical Anesthesia Plan  ASA: III  Anesthesia Plan: Spinal   Post-op Pain Management:  Regional for Post-op pain   Induction:   PONV Risk Score and Plan: Ondansetron and Dexamethasone  Airway Management Planned: Natural Airway and Simple Face Mask  Additional Equipment:   Intra-op Plan:   Post-operative Plan:   Informed Consent: I have reviewed the patients History and Physical, chart, labs and discussed the procedure including the risks, benefits and alternatives for the proposed anesthesia with the patient or authorized representative who has indicated his/her understanding and acceptance.     Dental advisory given  Plan Discussed with: CRNA and Anesthesiologist  Anesthesia Plan Comments:         Anesthesia Quick Evaluation

## 2019-06-17 NOTE — Op Note (Signed)
NAME:  [name]                      MEDICAL RECORD NO.:  [id number]                             FACILITY:  [facility]      PHYSICIAN:  Pietro Cassis. Alvan Dame, M.D.  DATE OF BIRTH:  Apr 08, 1951      DATE OF PROCEDURE:  06/17/2019                                     OPERATIVE REPORT         PREOPERATIVE DIAGNOSIS:  Left knee osteoarthritis.      POSTOPERATIVE DIAGNOSIS:  Left knee osteoarthritis.      FINDINGS:  The patient was noted to have complete loss of cartilage and   bone-on-bone arthritis with associated osteophytes in the medial and patellofemoral compartments of   the knee with a significant synovitis and associated effusion.  The patient had failed months of conservative treatment including medications, injection therapy, activity modification.     PROCEDURE:  Left total knee replacement.      COMPONENTS USED:  DePuy Attune rotating platform posterior stabilized knee   system, a size 8 femur, 8 tibia, size 6 mm PS AOX insert, and 38 anatomic patellar   button.      SURGEON:  Pietro Cassis. Alvan Dame, M.D.      ASSISTANT:  Griffith Citron, PA-C.      ANESTHESIA:  Regional and Spinal.      SPECIMENS:  None.      COMPLICATION:  None.      DRAINS:  None.  EBL: <100cc      TOURNIQUET TIME:  34 min at 250 mmHg   The patient was stable to the recovery room.      INDICATION FOR PROCEDURE:  [name] is a 68 y.o. male patient of   mine.  The patient had been seen, evaluated, and treated for months conservatively in the   office with medication, activity modification, and injections.  The patient had   radiographic changes of bone-on-bone arthritis with endplate sclerosis and osteophytes noted.  Based on the radiographic changes and failed conservative measures, the patient   decided to proceed with definitive treatment, total knee replacement.  Risks of infection, DVT, component failure, need for revision surgery, neurovascular injury were reviewed in the office setting.  The  postop course was reviewed stressing the efforts to maximize post-operative satisfaction and function.  Consent was obtained for benefit of pain   relief.      PROCEDURE IN DETAIL:  The patient was brought to the operative theater.   Once adequate anesthesia, preoperative antibiotics, 2 gm of Ancef,1 gm of Tranexamic Acid, and 10 mg of Decadron administered, the patient was positioned supine with a left thigh tourniquet placed.  The  left lower extremity was prepped and draped in sterile fashion.  A time-   out was performed identifying the patient, planned procedure, and the appropriate extremity.      The left lower extremity was placed in the [facility] leg holder.  The leg was   exsanguinated, tourniquet elevated to 250 mmHg.  A midline incision was   made followed by median parapatellar arthrotomy.  Following initial   exposure, attention was first directed to  the patella.  Precut   measurement was noted to be 25 mm.  I resected down to 14 mm and used a   38 anatomic patellar button to restore patellar height as well as cover the cut surface.      The lug holes were drilled and a metal shim was placed to protect the   patella from retractors and saw blade during the procedure.      At this point, attention was now directed to the femur.  The femoral   canal was opened with a drill, irrigated to try to prevent fat emboli.  An   intramedullary rod was passed at 5 degrees valgus, 9 mm of bone was   resected off the distal femur.  Following this resection, the tibia was   subluxated anteriorly.  Using the extramedullary guide, 2 mm of bone was resected off   the proximal medial tibia.  We confirmed the gap would be   stable medially and laterally with a size 5 spacer block as well as confirmed that the tibial cut was perpendicular in the coronal plane, checking with an alignment rod.      Once this was done, I sized the femur to be a size 8 in the anterior-   posterior dimension, chose a  standard component based on medial and   lateral dimension.  The size 8 rotation block was then pinned in   position anterior referenced using the C-clamp to set rotation.  The   anterior, posterior, and  chamfer cuts were made without difficulty nor   notching making certain that I was along the anterior cortex to help   with flexion gap stability.      The final box cut was made off the lateral aspect of distal femur.      At this point, the tibia was sized to be a size 8.  The size 8 tray was   then pinned in position through the medial third of the tubercle,   drilled, and keel punched.  Trial reduction was now carried with a 8 femur,  8 tibia, a size 6 mm PS insert, and the 38 anatomic patella botton.  The knee was brought to full extension with good flexion stability with the patella   tracking through the trochlea without application of pressure.  Given   all these findings the trial components removed.  Final components were   opened and cement was mixed.  The knee was irrigated with normal saline solution and pulse lavage.  The synovial lining was   then injected with 30 cc of 0.25% Marcaine with epinephrine, 1 cc of Toradol and 30 cc of NS for a total of 61 cc.     Final implants were then cemented onto cleaned and dried cut surfaces of bone with the knee brought to extension with a size 6 mm PS trial insert.      Once the cement had fully cured, excess cement was removed   throughout the knee.  I confirmed that I was satisfied with the range of   motion and stability, and the final size 6 mm PS AOX insert was chosen.  It was   placed into the knee.      The tourniquet had been let down at 34 minutes.  No significant   hemostasis was required.  The extensor mechanism was then reapproximated using #1 Vicryl and #1 Stratafix sutures with the knee   in flexion.  The   remaining wound  was closed with 2-0 Vicryl and running 4-0 Monocryl.   The knee was cleaned, dried, dressed  sterilely using Dermabond and   Aquacel dressing.  The patient was then   brought to recovery room in stable condition, tolerating the procedure   well.   Please note that Physician Assistant, Griffith Citron, PA-C was present for the entirety of the case, and was utilized for pre-operative positioning, peri-operative retractor management, general facilitation of the procedure and for primary wound closure at the end of the case.              Pietro Cassis Alvan Dame, M.D.    06/17/2019 7:20 AM

## 2019-06-18 ENCOUNTER — Encounter (HOSPITAL_COMMUNITY): Payer: Self-pay | Admitting: Orthopedic Surgery

## 2019-06-18 DIAGNOSIS — M1712 Unilateral primary osteoarthritis, left knee: Secondary | ICD-10-CM | POA: Diagnosis not present

## 2019-06-18 LAB — CBC
HCT: 42.5 % (ref 39.0–52.0)
Hemoglobin: 13.8 g/dL (ref 13.0–17.0)
MCH: 30.3 pg (ref 26.0–34.0)
MCHC: 32.5 g/dL (ref 30.0–36.0)
MCV: 93.4 fL (ref 80.0–100.0)
Platelets: 255 10*3/uL (ref 150–400)
RBC: 4.55 MIL/uL (ref 4.22–5.81)
RDW: 12.5 % (ref 11.5–15.5)
WBC: 13.2 10*3/uL — ABNORMAL HIGH (ref 4.0–10.5)
nRBC: 0 % (ref 0.0–0.2)

## 2019-06-18 LAB — BASIC METABOLIC PANEL
Anion gap: 7 (ref 5–15)
BUN: 13 mg/dL (ref 8–23)
CO2: 23 mmol/L (ref 22–32)
Calcium: 8.4 mg/dL — ABNORMAL LOW (ref 8.9–10.3)
Chloride: 107 mmol/L (ref 98–111)
Creatinine, Ser: 0.72 mg/dL (ref 0.61–1.24)
GFR calc Af Amer: >60 mL/min (ref >60)
GFR calc non Af Amer: >60 mL/min (ref >60)
Glucose, Bld: 133 mg/dL — ABNORMAL HIGH (ref 70–99)
Potassium: 4.1 mmol/L (ref 3.5–5.1)
Sodium: 137 mmol/L (ref 135–145)

## 2019-06-18 MED ORDER — POLYETHYLENE GLYCOL 3350 17 G PO PACK: 17.0000 g | PACK | Freq: Two times a day (BID) | ORAL | 0 refills | Status: DC

## 2019-06-18 MED ORDER — METHOCARBAMOL 500 MG PO TABS: 500.0000 mg | ORAL_TABLET | Freq: Four times a day (QID) | ORAL | 0 refills | Status: DC | PRN

## 2019-06-18 MED ORDER — CELECOXIB 200 MG PO CAPS: 200.0000 mg | ORAL_CAPSULE | Freq: Two times a day (BID) | ORAL | 0 refills | Status: DC

## 2019-06-18 MED ORDER — HYDROCODONE-ACETAMINOPHEN 7.5-325 MG PO TABS: 1.0000 | ORAL_TABLET | ORAL | 0 refills | Status: DC | PRN

## 2019-06-18 MED ORDER — FERROUS SULFATE 325 (65 FE) MG PO TABS: 325.0000 mg | ORAL_TABLET | Freq: Two times a day (BID) | ORAL | 0 refills | Status: DC

## 2019-06-18 MED ORDER — ASPIRIN 81 MG PO CHEW: 81.0000 mg | CHEWABLE_TABLET | Freq: Two times a day (BID) | ORAL | 0 refills | Status: AC

## 2019-06-18 MED ORDER — DOCUSATE SODIUM 100 MG PO CAPS: 100.0000 mg | ORAL_CAPSULE | Freq: Two times a day (BID) | ORAL | 0 refills | Status: DC

## 2019-06-18 NOTE — TOC Transition Note (Signed)
Transition of Care [facility]) - CM/SW Discharge Note   Patient Details  Name: [name] MRN: [id number] Date of Birth: January 01, 1951  Transition of Care [facility]) CM/SW Contact:  Lia Hopping, [place] Phone Number: 06/18/2019, 11:15 AM   Clinical Narrative:    Therapy Plan: OPPT DME-Walker ordered and delivered through [place], decline 3 IN1 has elevated toilet seat.    Final next level of care: OP Rehab Barriers to Discharge: No Barriers Identified   Patient Goals and CMS Choice     Choice offered to / list presented to : NA  Discharge Placement  Home                      Discharge Plan and Services                DME Arranged: Walker rolling DME Agency: [employer] Date DME Agency Contacted: 06/18/19 Time DME Agency Contacted: 0900 Representative spoke with at DME Agency: [place] ([place]) Interventions     Readmission Risk Interventions No flowsheet data found.

## 2019-06-18 NOTE — Plan of Care (Signed)
  Problem: Education: Goal: Knowledge of the prescribed therapeutic regimen will improve Outcome: Progressing   Problem: Activity: Goal: Ability to avoid complications of mobility impairment will improve Outcome: Progressing   Problem: Pain Management: Goal: Pain level will decrease with appropriate interventions Outcome: Progressing   Problem: Education: Goal: Knowledge of General Education information will improve Description: Including pain rating scale, medication(s)/side effects and non-pharmacologic comfort measures Outcome: Progressing   Problem: Nutrition: Goal: Adequate nutrition will be maintained Outcome: Progressing   Problem: Pain Managment: Goal: General experience of comfort will improve Outcome: Progressing

## 2019-06-18 NOTE — Progress Notes (Signed)
Physical Therapy Treatment Patient Details Name: [name] MRN: [id number] DOB: 1950-11-01 Today's Date: 06/18/2019    History of Present Illness Patient is 68 y.o male s/p Lt TKA on 06/17/19 with PMH significant for COPD, GERD, HTN, prostate cancer, and Rt TKA in 2016.    PT Comments    Progressing very well with mobility. Reviewed/practiced exercises, gait training,and stair training. Issued HEP for pt to perform 2x/day until he begins OPPT. All education completed. Okay to d/c from PT standpoint.    Follow Up Recommendations  Follow surgeon's recommendation for DC plan and follow-up therapies     Equipment Recommendations  Rolling walker with 5" wheels    Recommendations for Other Services       Precautions / Restrictions Precautions Precautions: Fall Restrictions Weight Bearing Restrictions: No LLE Weight Bearing: Weight bearing as tolerated    Mobility  Bed Mobility Overal bed mobility: Modified Independent                Transfers Overall transfer level: Needs assistance Equipment used: Rolling walker (2 wheeled) Transfers: Sit to/from Stand Sit to Stand: Supervision            Ambulation/Gait Ambulation/Gait assistance: Supervision Gait Distance (Feet): 200 Feet Assistive device: Rolling walker (2 wheeled) Gait Pattern/deviations: Step-through pattern;Decreased stride length         Stairs Stairs: Yes Stairs assistance: Supervision Stair Management: Step to pattern;Forwards;With walker Number of Stairs: 1 General stair comments: VCs safety, sequence, technique.   Wheelchair Mobility    Modified Rankin (Stroke Patients Only)       Balance Overall balance assessment: Mild deficits observed, not formally tested                                          Cognition Arousal/Alertness: Awake/alert Behavior During Therapy: WFL for tasks assessed/performed Overall Cognitive Status: Within Functional Limits for  tasks assessed                                        Exercises Total Joint Exercises Ankle Circles/Pumps: AROM;Both;10 reps;Supine Quad Sets: AROM;Both;10 reps;Supine Heel Slides: AAROM Hip ABduction/ADduction: Left;10 reps;Supine Straight Leg Raises: AROM;Left;10 reps;Supine Long Arc Quad: AAROM;AROM;Left;10 reps;Supine Goniometric ROM: ~5-75 degrees    General Comments        Pertinent Vitals/Pain Pain Assessment: 0-10 Pain Score: 5  Pain Location: Lt knee Pain Descriptors / Indicators: Grimacing;Guarding Pain Intervention(s): Monitored during session;Ice applied;Repositioned    Home Living                      Prior Function            PT Goals (current goals can now be found in the care plan section) Progress towards PT goals: Progressing toward goals    Frequency    7X/week      PT Plan Current plan remains appropriate    Co-evaluation              AM-PAC PT "6 Clicks" Mobility   Outcome Measure  Help needed turning from your back to your side while in a flat bed without using bedrails?: None Help needed moving from lying on your back to sitting on the side of a flat bed without using bedrails?: None Help needed moving to  and from a bed to a chair (including a wheelchair)?: A Little Help needed standing up from a chair using your arms (e.g., wheelchair or bedside chair)?: A Little Help needed to walk in hospital room?: A Little Help needed climbing 3-5 steps with a railing? : A Little 6 Click Score: 20    End of Session Equipment Utilized During Treatment: Gait belt Activity Tolerance: Patient tolerated treatment well Patient left: in chair;with call bell/phone within reach;with chair alarm set   PT Visit Diagnosis: Unsteadiness on feet (R26.81);Difficulty in walking, not elsewhere classified (R26.2);Muscle weakness (generalized) (M62.81);Other abnormalities of gait and mobility (R26.89)     Time: [id number] PT  Time Calculation (min) (ACUTE ONLY): 21 min  Charges:  $Gait Training: 8-22 mins                        Weston Anna, PT Acute Rehabilitation Services Pager: [phone] Office: [phone]

## 2019-06-18 NOTE — Progress Notes (Signed)
   Subjective: 1 Day Post-Op Procedure(s) (LRB): TOTAL KNEE ARTHROPLASTY (Left) Patient reports pain as mild.   Patient seen in rounds with Dr. Alvan Dame. Patient is well, and has had no acute complaints or problems other than discomfort in the left thigh. No acute events overnight. Foley catheter removed, voiding without difficulty. Positive flatus. Denies CP, SHOB. Patient states he is ready to go home today. We will continue therapy today.   Objective: Vital signs in last 24 hours: Temp:  [97.5 F (36.4 C)-98.5 F (36.9 C)] 97.8 F (36.6 C) (09/30 0440) Pulse Rate:  [63-88] 88 (09/30 0440) Resp:  [10-18] 18 (09/30 0440) BP: (125-152)/(73-95) 130/73 (09/30 0440) SpO2:  [92 %-100 %] 95 % (09/30 0741)  Intake/Output from previous day:  Intake/Output Summary (Last 24 hours) at 06/18/2019 0756 Last data filed at 06/18/2019 0600 Gross per 24 hour  Intake 3869.34 ml  Output 2250 ml  Net 1619.34 ml     Intake/Output this shift: No intake/output data recorded.  Labs: Recent Labs    06/18/19 0307  HGB 13.8   Recent Labs    06/18/19 0307  WBC 13.2*  RBC 4.55  HCT 42.5  PLT 255   Recent Labs    06/18/19 0307  NA 137  K 4.1  CL 107  CO2 23  BUN 13  CREATININE 0.72  GLUCOSE 133*  CALCIUM 8.4*   No results for input(s): LABPT, INR in the last 72 hours.  Exam: General - Patient is Alert and Oriented Extremity - Neurologically intact Sensation intact distally Intact pulses distally Dorsiflexion/Plantar flexion intact Dressing - dressing C/D/I Motor Function - intact, moving foot and toes well on exam.   Past Medical History:  Diagnosis Date  . Allergy    seasonal  . Arthritis    oa  . Asthma   . COPD (chronic obstructive pulmonary disease) ([place])   . GERD (gastroesophageal reflux disease)   . Hypertension   . Prostate cancer ([place]) 01/2016   prostatectomy   . Shortness of breath dyspnea     Assessment/Plan: 1 Day Post-Op Procedure(s) (LRB): TOTAL KNEE  ARTHROPLASTY (Left) Active Problems:   S/P left TKA   Status post total left knee replacement  Estimated body mass index is 30.68 kg/m as calculated from the following:   Height as of this encounter: 5\' 11"  (1.803 m).   Weight as of this encounter: 99.8 kg. Advance diet Up with therapy D/C IV fluids   Patient's anticipated LOS is less than 2 midnights, meeting these requirements: - Lives within 1 hour of care - Has a competent adult at home to recover with post-op recover - NO history of  - Chronic pain requiring opiods  - Diabetes  - Coronary Artery Disease  - Heart failure  - Heart attack  - Stroke  - DVT/VTE  - Cardiac arrhythmia  - Respiratory Failure/COPD  - Renal failure  - Anemia  - Advanced Liver disease   DVT Prophylaxis - Aspirin Weight bearing as tolerated. D/C O2 and pulse ox and try on room air.  Plan is to go Home after hospital stay. Plan for discharge today after 1-2 sessions of therapy as long as he is meeting his goals. He is scheduled for OPPT. We discussed that his thigh pain is associated with tourniquet placement. He will follow up in the office in 2 weeks.   Griffith Citron, PA-C Orthopedic Surgery [phone] 06/18/2019, 7:56 AM

## 2019-06-18 NOTE — Plan of Care (Signed)

## 2019-06-23 NOTE — Discharge Summary (Signed)
Physician Discharge Summary   Patient ID: [name] MRN: [id number] DOB/AGE: 68-Feb-1952 68 y.o.  Admit date: 06/17/2019 Discharge date: 06/18/2019  Primary Diagnosis: Osteoarthritis, left knee  Admission Diagnoses:  Past Medical History:  Diagnosis Date   Allergy    seasonal   Arthritis    oa   Asthma    COPD (chronic obstructive pulmonary disease) ([place])    GERD (gastroesophageal reflux disease)    Hypertension    Prostate cancer ([place]) 01/2016   prostatectomy    Shortness of breath dyspnea    Discharge Diagnoses:   Active Problems:   S/P left TKA   Status post total left knee replacement  Estimated body mass index is 30.68 kg/m as calculated from the following:   Height as of this encounter: 5\' 11"  (1.803 m).   Weight as of this encounter: 99.8 kg.  Procedure:  Procedure(s) (LRB): TOTAL KNEE ARTHROPLASTY (Left)   Consults: None  HPI:  [name] is a 68 y.o. male patient of   mine.  The patient had been seen, evaluated, and treated for months conservatively in the   office with medication, activity modification, and injections.  The patient had   radiographic changes of bone-on-bone arthritis with endplate sclerosis and osteophytes noted.  Based on the radiographic changes and failed conservative measures, the patient   decided to proceed with definitive treatment, total knee replacement.  Risks of infection, DVT, component failure, need for revision surgery, neurovascular injury were reviewed in the office setting.  The postop course was reviewed stressing the efforts to maximize post-operative satisfaction and function.  Consent was obtained for benefit of pain   relief.   Laboratory Data: Admission on 06/17/2019, Discharged on 06/18/2019  Component Date Value Ref Range Status   WBC 06/18/2019 13.2* 4.0 - 10.5 K/uL Final   RBC 06/18/2019 4.55  4.22 - 5.81 MIL/uL Final   Hemoglobin 06/18/2019 13.8  13.0 - 17.0 g/dL Final   HCT 06/18/2019  42.5  39.0 - 52.0 % Final   MCV 06/18/2019 93.4  80.0 - 100.0 fL Final   MCH 06/18/2019 30.3  26.0 - 34.0 pg Final   MCHC 06/18/2019 32.5  30.0 - 36.0 g/dL Final   RDW 06/18/2019 12.5  11.5 - 15.5 % Final   Platelets 06/18/2019 255  150 - 400 K/uL Final   nRBC 06/18/2019 0.0  0.0 - 0.2 % Final   Performed at [facility], [place] [address] [place], [place] [address]   Sodium 06/18/2019 137  135 - 145 mmol/L Final   Potassium 06/18/2019 4.1  3.5 - 5.1 mmol/L Final   Chloride 06/18/2019 107  98 - 111 mmol/L Final   CO2 06/18/2019 23  22 - 32 mmol/L Final   Glucose, Bld 06/18/2019 133* 70 - 99 mg/dL Final   BUN 06/18/2019 13  8 - 23 mg/dL Final   Creatinine, Ser 06/18/2019 0.72  0.61 - 1.24 mg/dL Final   Calcium 06/18/2019 8.4* 8.9 - 10.3 mg/dL Final   GFR calc non Af Amer 06/18/2019 >60  >60 mL/min Final   GFR calc Af Amer 06/18/2019 >60  >60 mL/min Final   Anion gap 06/18/2019 7  5 - 15 Final   Performed at [facility], [place] [address], [place], [place] [address]  Hospital Outpatient Visit on 06/13/2019  Component Date Value Ref Range Status   SARS-CoV-2, NAA 06/13/2019 NOT DETECTED  NOT DETECTED Final   Comment: (NOTE) This nucleic acid amplification test was  developed and its performance characteristics determined by [employer]. Nucleic acid amplification tests include PCR and TMA. This test has not been FDA cleared or approved. This test has been authorized by FDA under an Emergency Use Authorization (EUA). This test is only authorized for the duration of time the declaration that circumstances exist justifying the authorization of the emergency use of in vitro diagnostic tests for detection of SARS-CoV-2 virus and/or diagnosis of COVID-19 infection under section 564(b)(1) of the Act, 21 U.S.C. [id number]) (1), unless the authorization is terminated or revoked sooner. When diagnostic testing is negative, the  possibility of a false negative result should be considered in the context of a patient's recent exposures and the presence of clinical signs and symptoms consistent with COVID-19. An individual without symptoms of COVID- 19 and who is not shedding SARS-CoV-2 vi                          rus would expect to have a negative (not detected) result in this assay. Performed At: [facility] [place], [place] [id number] Rush Farmer MD [id number]    Coronavirus Source 06/13/2019 NASOPHARYNGEAL   Final   Performed at [facility], [place] [address] [place], [place] [address]  Hospital Outpatient Visit on 06/09/2019  Component Date Value Ref Range Status   ABO/RH(D) 06/09/2019 O POS   Final   Antibody Screen 06/09/2019 NEG   Final   Sample Expiration 06/09/2019 06/20/2019,2359   Final   Extend sample reason 06/09/2019    Final                   Value:NO TRANSFUSIONS OR PREGNANCY IN THE PAST 3 MONTHS Performed at [facility], [place] [address], [place], [place] [address]    MRSA, PCR 06/09/2019 NEGATIVE  NEGATIVE Final   Staphylococcus aureus 06/09/2019 NEGATIVE  NEGATIVE Final   Comment: (NOTE) The Xpert SA Assay (FDA approved for NASAL specimens in patients 37 years of age and older), is one component of a comprehensive surveillance program. It is not intended to diagnose infection nor to guide or monitor treatment. Performed at [facility], [place] [address], [place], [place] [address]    Sodium 06/09/2019 138  135 - 145 mmol/L Final   Potassium 06/09/2019 4.0  3.5 - 5.1 mmol/L Final   Chloride 06/09/2019 106  98 - 111 mmol/L Final   CO2 06/09/2019 26  22 - 32 mmol/L Final   Glucose, Bld 06/09/2019 108* 70 - 99 mg/dL Final   BUN 06/09/2019 14  8 - 23 mg/dL Final   Creatinine, Ser 06/09/2019 0.74  0.61 - 1.24 mg/dL Final   Calcium 06/09/2019 9.0  8.9 - 10.3 mg/dL Final   GFR calc non Af Amer 06/09/2019  >60  >60 mL/min Final   GFR calc Af Amer 06/09/2019 >60  >60 mL/min Final   Anion gap 06/09/2019 6  5 - 15 Final   Performed at [facility], [place] [address], [place], [place] [address]   WBC 06/09/2019 4.8  4.0 - 10.5 K/uL Final   RBC 06/09/2019 5.35  4.22 - 5.81 MIL/uL Final   Hemoglobin 06/09/2019 16.1  13.0 - 17.0 g/dL Final   HCT 06/09/2019 50.2  39.0 - 52.0 % Final   MCV 06/09/2019 93.8  80.0 - 100.0 fL Final   MCH 06/09/2019 30.1  26.0 - 34.0 pg Final   MCHC 06/09/2019 32.1  30.0 -  36.0 g/dL Final   RDW 06/09/2019 12.4  11.5 - 15.5 % Final   Platelets 06/09/2019 248  150 - 400 K/uL Final   nRBC 06/09/2019 0.0  0.0 - 0.2 % Final   Performed at [facility], [place] [address], [place], [place] [address]  Office Visit on 06/02/2019  Component Date Value Ref Range Status   WBC 06/02/2019 5.4  3.4 - 10.8 x10E3/uL Final   RBC 06/02/2019 5.64  4.14 - 5.80 x10E6/uL Final   Hemoglobin 06/02/2019 17.2  13.0 - 17.7 g/dL Final   Hematocrit 06/02/2019 51.5* 37.5 - 51.0 % Final   MCV 06/02/2019 91  79 - 97 fL Final   MCH 06/02/2019 30.5  26.6 - 33.0 pg Final   MCHC 06/02/2019 33.4  31.5 - 35.7 g/dL Final   RDW 06/02/2019 12.9  11.6 - 15.4 % Final   Platelets 06/02/2019 287  150 - 450 x10E3/uL Final  Office Visit on 05/19/2019  Component Date Value Ref Range Status   SARS-CoV-2, NAA 05/19/2019 Not Detected  Not Detected Final   Comment: This nucleic acid amplification test was developed and its perfomance characteristics determined by [employer]. Nucleic acid amplification tests include PCR and TMA. This test has not been FDA cleared or approved. This test has been authorized by FDA under an Emergency Use Authorization (EUA). This test is only authorized for the duration of time the declaration that circumstances exist justifying the authorization of the emergency use of in vitro diagnostic tests for detection of SARS-CoV-2  virus and/or diagnosis of COVID-19 infection under section 564(b)(1) of the Act, 21 U.S.C. [id number]) (1), unless the authorization is terminated or revoked sooner. When diagnostic testing is negative, the possibility of a false negative result should be considered in the context of a patient's recent exposures and the presence of clinical signs and symptoms consistent with COVID-19. An individual without symptoms of COVID-19 and who is not shedding SARS-CoV-2 virus would                           expect to have a negative (not detected) result in this assay.   Admission on 05/13/2019, Discharged on 05/13/2019  Component Date Value Ref Range Status   Sodium 05/13/2019 139  135 - 145 mmol/L Final   Potassium 05/13/2019 4.3  3.5 - 5.1 mmol/L Final   Chloride 05/13/2019 102  98 - 111 mmol/L Final   CO2 05/13/2019 29  22 - 32 mmol/L Final   Glucose, Bld 05/13/2019 101* 70 - 99 mg/dL Final   BUN 05/13/2019 10  8 - 23 mg/dL Final   Creatinine, Ser 05/13/2019 0.85  0.61 - 1.24 mg/dL Final   Calcium 05/13/2019 9.3  8.9 - 10.3 mg/dL Final   GFR calc non Af Amer 05/13/2019 >60  >60 mL/min Final   GFR calc Af Amer 05/13/2019 >60  >60 mL/min Final   Anion gap 05/13/2019 8  5 - 15 Final   Performed at [facility], [place] [address], [place], [place] [address]   WBC 05/13/2019 11.0* 4.0 - 10.5 K/uL Final   RBC 05/13/2019 5.49  4.22 - 5.81 MIL/uL Final   Hemoglobin 05/13/2019 16.5  13.0 - 17.0 g/dL Final   HCT 05/13/2019 51.8  39.0 - 52.0 % Final   MCV 05/13/2019 94.4  80.0 - 100.0 fL Final   MCH 05/13/2019 30.1  26.0 - 34.0 pg Final   MCHC 05/13/2019 31.9  30.0 -  36.0 g/dL Final   RDW 05/13/2019 12.4  11.5 - 15.5 % Final   Platelets 05/13/2019 264  150 - 400 K/uL Final   nRBC 05/13/2019 0.0  0.0 - 0.2 % Final   Neutrophils Relative % 05/13/2019 69  % Final   Neutro Abs 05/13/2019 7.7  1.7 - 7.7 K/uL Final   Lymphocytes Relative 05/13/2019 19  % Final     Lymphs Abs 05/13/2019 2.0  0.7 - 4.0 K/uL Final   Monocytes Relative 05/13/2019 10  % Final   Monocytes Absolute 05/13/2019 1.1* 0.1 - 1.0 K/uL Final   Eosinophils Relative 05/13/2019 1  % Final   Eosinophils Absolute 05/13/2019 0.1  0.0 - 0.5 K/uL Final   Basophils Relative 05/13/2019 1  % Final   Basophils Absolute 05/13/2019 0.1  0.0 - 0.1 K/uL Final   Immature Granulocytes 05/13/2019 0  % Final   Abs Immature Granulocytes 05/13/2019 0.04  0.00 - 0.07 K/uL Final   Performed at [facility], [place] [address], [place], [place] [address]   Color, Urine 05/13/2019 STRAW* YELLOW Final   APPearance 05/13/2019 CLEAR  CLEAR Final   Specific Gravity, Urine 05/13/2019 1.010  1.005 - 1.030 Final   pH 05/13/2019 8.0  5.0 - 8.0 Final   Glucose, UA 05/13/2019 NEGATIVE  NEGATIVE mg/dL Final   Hgb urine dipstick 05/13/2019 NEGATIVE  NEGATIVE Final   Bilirubin Urine 05/13/2019 NEGATIVE  NEGATIVE Final   Ketones, ur 05/13/2019 NEGATIVE  NEGATIVE mg/dL Final   Protein, ur 05/13/2019 NEGATIVE  NEGATIVE mg/dL Final   Nitrite 05/13/2019 NEGATIVE  NEGATIVE Final   Leukocytes,Ua 05/13/2019 NEGATIVE  NEGATIVE Final   Performed at [place] [address], [place], [place] [address]     X-Rays:Dg Chest 2 View  Result Date: 06/02/2019 CLINICAL DATA:  Preop. EXAM: CHEST - 2 VIEW COMPARISON:  Radiograph of May 13, 2019. FINDINGS: The heart size and mediastinal contours are within normal limits. Both lungs are clear. The visualized skeletal structures are unremarkable. IMPRESSION: No active cardiopulmonary disease. Electronically Signed   By: Marijo Conception M.D.   On: 06/02/2019 15:59    EKG: Orders placed or performed in visit on 06/02/19   EKG 12-Lead     Hospital Course: [name] is a 68 y.o. who was admitted to [facility]. They were brought to the operating room on 06/17/2019 and underwent Procedure(s): TOTAL KNEE  ARTHROPLASTY.  Patient tolerated the procedure well and was later transferred to the recovery room and then to the orthopaedic floor for postoperative care. They were given PO and IV analgesics for pain control following their surgery. They were given 24 hours of postoperative antibiotics of  Anti-infectives (From admission, onward)   Start     Dose/Rate Route Frequency Ordered Stop   06/17/19 1330  ceFAZolin (ANCEF) IVPB 2g/100 mL premix     2 g 200 mL/hr over 30 Minutes Intravenous Every 6 hours 06/17/19 1046 06/17/19 1949   06/17/19 0600  ceFAZolin (ANCEF) IVPB 2g/100 mL premix     2 g 200 mL/hr over 30 Minutes Intravenous On call to O.R. 06/17/19 0543 06/17/19 0733     and started on DVT prophylaxis in the form of Aspirin.   PT and OT were ordered for total joint protocol. Discharge planning consulted to help with postop disposition and equipment needs.  Patient had a good night on the evening of surgery. They started to get up OOB with therapy on POD #0.  Pt was seen during rounds and was ready to go home pending progress with therapy. He worked with therapy on POD #1 and was meeting his goals. Pt was discharged to home later that day in stable condition.  Diet: Regular diet Activity: WBAT Follow-up: in 2 weeks Disposition: Home Discharged Condition: good   Discharge Instructions    Call MD / Call 911   Complete by: As directed    If you experience chest pain or shortness of breath, CALL 911 and be transported to the hospital emergency room.  If you develope a fever above 101 F, pus (white drainage) or increased drainage or redness at the wound, or calf pain, call your surgeon's office.   Change dressing   Complete by: As directed    Maintain surgical dressing until follow up in the clinic. If the edges start to pull up, may reinforce with tape. If the dressing is no longer working, may remove and cover with gauze and tape, but must keep the area dry and clean.  Call with any questions  or concerns.   Constipation Prevention   Complete by: As directed    Drink plenty of fluids.  Prune juice may be helpful.  You may use a stool softener, such as Colace (over the counter) 100 mg twice a day.  Use MiraLax (over the counter) for constipation as needed.   Diet - low sodium heart healthy   Complete by: As directed    Discharge instructions   Complete by: As directed    Maintain surgical dressing until follow up in the clinic. If the edges start to pull up, may reinforce with tape. If the dressing is no longer working, may remove and cover with gauze and tape, but must keep the area dry and clean.  Follow up in 2 weeks at [facility]. Call with any questions or concerns.   Increase activity slowly as tolerated   Complete by: As directed    Weight bearing as tolerated with assist device (walker, cane, etc) as directed, use it as long as suggested by your surgeon or therapist, typically at least 4-6 weeks.   TED hose   Complete by: As directed    Use stockings (TED hose) for 2 weeks on both leg(s).  You may remove them at night for sleeping.     Allergies as of 06/18/2019      Reactions   Percocet [oxycodone-acetaminophen] Other (See Comments)   Nausea-Doesn't work   Amoxicillin Rash   Has patient had a PCN reaction causing immediate rash, facial/tongue/throat swelling, SOB or lightheadedness with hypotension: No Has patient had a PCN reaction causing severe rash involving mucus membranes or skin necrosis: No Has patient had a PCN reaction that required hospitalization No Has patient had a PCN reaction occurring within the last 10 years: Yes- 2016 If all of the above answers are "NO", then may proceed with Cephalosporin use.      Medication List    STOP taking these medications   meloxicam 7.5 MG tablet Commonly known as: MOBIC   traMADol 50 MG tablet Commonly known as: ULTRAM     TAKE these medications   albuterol 108 (90 Base) MCG/ACT inhaler Commonly  known as: VENTOLIN HFA Inhale 2 puffs into the lungs every 6 (six) hours as needed for wheezing or shortness of breath.   amLODipine 2.5 MG tablet Commonly known as: NORVASC Take 1 tablet (2.5 mg total) by mouth daily.   amLODipine 5 MG tablet Commonly known as:  NORVASC Take 1 tablet (5 mg total) by mouth daily.   aspirin 81 MG chewable tablet Chew 1 tablet (81 mg total) by mouth 2 (two) times daily for 28 days. Then discontinue Aspirin. Notes to patient: Blood thinner   azelastine 0.05 % ophthalmic solution Commonly known as: OPTIVAR Place 1 drop into both eyes 2 (two) times daily as needed. For itching/allergy symptoms   celecoxib 200 MG capsule Commonly known as: CELEBREX Take 1 capsule (200 mg total) by mouth 2 (two) times daily.   cyclobenzaprine 5 MG tablet Commonly known as: FLEXERIL take one tablet by mouth three times daily as needed for muscle spasm. start at bedtime due to sedation What changed: See the new instructions.   docusate sodium 100 MG capsule Commonly known as: COLACE Take 1 capsule (100 mg total) by mouth 2 (two) times daily. Notes to patient: Stool Softner   esomeprazole 40 MG capsule Commonly known as: NEXIUM TAKE 1 CAPSULE BY MOUTH DAILY   KLS Esomeprazole Magnesium 20 MG capsule Generic drug: esomeprazole TAKE TWO CAPSULES BY MOUTH DAILY   ferrous sulfate 325 (65 FE) MG tablet Take 1 tablet (325 mg total) by mouth 2 (two) times daily with a meal for 14 days.   fluticasone 50 MCG/ACT nasal spray Commonly known as: KLS Aller-Flo Place 2 sprays into both nostrils daily.   HYDROcodone-acetaminophen 7.5-325 MG tablet Commonly known as: NORCO Take 1-2 tablets by mouth every 4 (four) hours as needed for severe pain (pain score 7-10). Notes to patient: Pain   Incruse Ellipta 62.5 MCG/INH Aepb Generic drug: umeclidinium bromide inhale 1 puff into lungs once daily What changed: See the new instructions.   KLS AllerClear 10 MG tablet Generic  drug: loratadine TAKE 1 TABLET BY MOUTH DAILY What changed:   how much to take  when to take this  reasons to take this   methocarbamol 500 MG tablet Commonly known as: ROBAXIN Take 1 tablet (500 mg total) by mouth every 6 (six) hours as needed for muscle spasms. Notes to patient: Muscle Relaxer   Opcon-A 0.027-0.315 % Soln Generic drug: Naphazoline-Pheniramine Apply 1 drop to eye 2 (two) times daily as needed (dry/irritated eyes).   polyethylene glycol 17 g packet Commonly known as: MIRALAX / GLYCOLAX Take 17 g by mouth 2 (two) times daily.   pravastatin 20 MG tablet Commonly known as: PRAVACHOL Take 1 tablet (20 mg total) by mouth daily.   SALONPAS PAIN RELIEF PATCH EX Place 1 patch onto the skin daily as needed (pain.).   triamcinolone cream 0.1 % Commonly known as: KENALOG Apply 1 application topically 2 (two) times daily as needed. To affected areas only as needed. What changed:   reasons to take this  additional instructions   Vitamin D3 50 MCG (2000 UT) Tabs Take 2,000 Units by mouth daily.            Discharge Care Instructions  (From admission, onward)         Start     Ordered   06/18/19 0000  Change dressing    Comments: Maintain surgical dressing until follow up in the clinic. If the edges start to pull up, may reinforce with tape. If the dressing is no longer working, may remove and cover with gauze and tape, but must keep the area dry and clean.  Call with any questions or concerns.   06/18/19 1115         Follow-up Information    [name], MD. Schedule an appointment  as soon as possible for a visit in 2 weeks.   Specialty: Orthopedic Surgery Contact information: [address] [place] [address] [phone]           Signed: Griffith Citron, PA-C Orthopedic Surgery 06/23/2019, 10:47 AM

## 2019-07-10 ENCOUNTER — Other Ambulatory Visit: Payer: Self-pay | Admitting: Family Medicine

## 2019-07-10 DIAGNOSIS — I10 Essential (primary) hypertension: Secondary | ICD-10-CM

## 2019-07-14 ENCOUNTER — Ambulatory Visit: Payer: Managed Care, Other (non HMO) | Admitting: Family Medicine

## 2019-07-14 ENCOUNTER — Other Ambulatory Visit: Payer: Self-pay

## 2019-07-14 ENCOUNTER — Encounter: Payer: Self-pay | Admitting: Family Medicine

## 2019-07-14 VITALS — BP 134/80 | HR 100 | Temp 98.4°F | Wt 225.0 lb

## 2019-07-14 DIAGNOSIS — J449 Chronic obstructive pulmonary disease, unspecified: Secondary | ICD-10-CM

## 2019-07-14 DIAGNOSIS — E785 Hyperlipidemia, unspecified: Secondary | ICD-10-CM | POA: Diagnosis not present

## 2019-07-14 DIAGNOSIS — R1031 Right lower quadrant pain: Secondary | ICD-10-CM

## 2019-07-14 DIAGNOSIS — J309 Allergic rhinitis, unspecified: Secondary | ICD-10-CM | POA: Diagnosis not present

## 2019-07-14 DIAGNOSIS — Z23 Encounter for immunization: Secondary | ICD-10-CM

## 2019-07-14 DIAGNOSIS — Z8719 Personal history of other diseases of the digestive system: Secondary | ICD-10-CM

## 2019-07-14 DIAGNOSIS — I10 Essential (primary) hypertension: Secondary | ICD-10-CM | POA: Diagnosis not present

## 2019-07-14 DIAGNOSIS — H1010 Acute atopic conjunctivitis, unspecified eye: Secondary | ICD-10-CM

## 2019-07-14 MED ORDER — AZELASTINE HCL 0.05 % OP SOLN: 1.0000 [drp] | Freq: Two times a day (BID) | OPHTHALMIC | 1 refills | Status: AC | PRN

## 2019-07-14 MED ORDER — FLUTICASONE PROPIONATE 50 MCG/ACT NA SUSP: 2.0000 | Freq: Every day | NASAL | 5 refills | Status: DC

## 2019-07-14 MED ORDER — ESOMEPRAZOLE MAGNESIUM 40 MG PO CPDR: 40.0000 mg | DELAYED_RELEASE_CAPSULE | Freq: Every day | ORAL | 1 refills | Status: DC

## 2019-07-14 MED ORDER — AMLODIPINE BESYLATE 5 MG PO TABS: 5.0000 mg | ORAL_TABLET | Freq: Every day | ORAL | 1 refills | Status: DC

## 2019-07-14 MED ORDER — AMLODIPINE BESYLATE 2.5 MG PO TABS: 2.5000 mg | ORAL_TABLET | Freq: Every day | ORAL | 1 refills | Status: DC

## 2019-07-14 MED ORDER — PRAVASTATIN SODIUM 20 MG PO TABS: 20.0000 mg | ORAL_TABLET | Freq: Every day | ORAL | 1 refills | Status: DC

## 2019-07-14 NOTE — Progress Notes (Signed)
Subjective:    Patient ID: [name], male    DOB: [birth year], 68 y.o.   MRN: [id number]  HPI [name] is a 68 y.o. male Presents today for: Chief Complaint  Patient presents with   Medical Management of Chronic Issues     6 week f/u   Groin Pain    right groin pain. Possible hernia   Here for follow-up.  Status post left total knee replacement September 29. In PT. Progressing well. Pain managed with hydrocodone as needed. Taking 3-4 times on average.   Hypertension: BP Readings from Last 3 Encounters:  07/14/19 134/80  06/18/19 (!) 156/79  06/09/19 (!) 154/83   Lab Results  Component Value Date   CREATININE 0.72 06/18/2019  Variable control previously, stabilized at September 14 visit on 7.5 mg amlodipine daily.  Headaches are also improving at that time, but recommended continued use of steroid nasal spray for allergies for possible allergic component of headache. Not having HA.  No new side effects of meds - no home readings.   COPD: Followed by pulmonology, Dr. [name]. History of benign pulmonary nodules that have been followed with serial CT scans of the chest.  Chronic cough in setting of GERD and chronic rhinitis.  Treated with Incruse, albuterol as needed. Not needing albuterol.  Pneumovax given today.    Allergic rhinitis: Managed with over-the-counter fluticasone 2 sprays per nostril daily,  azelastine for ophthalmic symptoms, AllerClear 10 mg daily. Working well - allergies controlled.   GERD Managed with esomeprazole 40 mg daily.  Possible contribution to cough as above.  Taking daily.  Rare breakthrough sx's resolved with TUMS once per week.   Hyperlipidemia:  Lab Results  Component Value Date   CHOL 199 12/30/2018   HDL 39 (L) 12/30/2018   LDLCALC 131 (H) 12/30/2018   TRIG 143 12/30/2018   CHOLHDL 5.1 (H) 12/30/2018   Lab Results  Component Value Date   ALT 38 12/30/2018   AST 21 12/30/2018   ALKPHOS 109 12/30/2018   BILITOT  0.7 12/30/2018  Pravastatin 20 mg daily. No new myalgias/side effects.   R groin pain:  In area of prior hernia surgery. Sore with sitting up. Bending. No swelling seen in area.  2 prior hernia surgeries on right, 1 on left. Dr. [name]. Last surgery in 2015 - open repair of recurrent R inguinal hernia with mesh.  No bowel changes or bleeding. On stool softener for opioid use. Not straining/not constipated.   Patient Active Problem List   Diagnosis Date Noted   S/P left TKA 06/17/2019   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/17/2019   Allergic rhinitis 03/12/2017   Thrush, oral 02/27/2017   SUI (stress urinary incontinence), male 03/20/2016   Prostate cancer ([place]) 02/09/2016   Obese 09/08/2015   S/P right TKA 09/06/2015   S/P knee replacement 09/06/2015   COPD, mild ([place]) 07/20/2014   Pulmonary nodules 02/23/2014   Tobacco use disorder, moderate, in sustained remission 02/23/2014   Recurrent right inguinal hernia 10/20/2013   ED (erectile dysfunction) 08/26/2012   Physical exam, annual 11/05/2011   HTN (hypertension) 11/05/2011   GERD (gastroesophageal reflux disease) 11/05/2011   Chronic joint pain 11/05/2011   Past Medical History:  Diagnosis Date   Allergy    seasonal   Arthritis    oa   Asthma    COPD (chronic obstructive pulmonary disease) ([place])    GERD (gastroesophageal reflux disease)    Hypertension    Prostate cancer ([place])  01/2016   prostatectomy    Shortness of breath dyspnea    Past Surgical History:  Procedure Laterality Date   HERNIA REPAIR  last done 3-4 yrs ago   x 2   INGUINAL HERNIA REPAIR Right 11/20/2013   Procedure: OPEN REPAIR OF RECURRENT RIGHT INGUINAL HERNIA WITH MESH;  Surgeon: [name]. [name], MD;  Location: [facility];  Service: General;  Laterality: Right;   INSERTION OF MESH Right 11/20/2013   Procedure: INSERTION OF MESH;  Surgeon: [name]. [name], MD;  Location: [facility];  Service: General;  Laterality: Right;    KNEE SURGERY Left july 2014   mcl and meniscus repair   LYMPHADENECTOMY Bilateral 02/09/2016   Procedure: PELVIC LYMPHADENECTOMY;  Surgeon: [name], MD;  Location: [facility];  Service: Urology;  Laterality: Bilateral;   right knee arthroscopy      ROBOT ASSISTED LAPAROSCOPIC RADICAL PROSTATECTOMY N/A 02/09/2016   Procedure: XI ROBOTIC ASSISTED LAPAROSCOPIC RADICAL PROSTATECTOMY WITH INDOCYANINE GREEN DYE AND ADHESIOLYSIS;  Surgeon: [name], MD;  Location: [facility];  Service: Urology;  Laterality: N/A;   TOTAL KNEE ARTHROPLASTY Right 09/06/2015   Procedure: RIGHT TOTAL KNEE ARTHROPLASTY;  Surgeon: [name], MD;  Location: [facility];  Service: Orthopedics;  Laterality: Right;   TOTAL KNEE ARTHROPLASTY Left 06/17/2019   Procedure: TOTAL KNEE ARTHROPLASTY;  Surgeon: [name], MD;  Location: [facility];  Service: Orthopedics;  Laterality: Left;  70 mins   Allergies  Allergen Reactions   Percocet [Oxycodone-Acetaminophen] Other (See Comments)    Nausea-Doesn't work   Amoxicillin Rash    Has patient had a PCN reaction causing immediate rash, facial/tongue/throat swelling, SOB or lightheadedness with hypotension: No Has patient had a PCN reaction causing severe rash involving mucus membranes or skin necrosis: No Has patient had a PCN reaction that required hospitalization No Has patient had a PCN reaction occurring within the last 10 years: Yes- 2016 If all of the above answers are "NO", then may proceed with Cephalosporin use.    Prior to Admission medications   Medication Sig Start Date End Date Taking? Authorizing Provider  albuterol (PROVENTIL HFA;VENTOLIN HFA) 108 (90 Base) MCG/ACT inhaler Inhale 2 puffs into the lungs every 6 (six) hours as needed for wheezing or shortness of breath. 03/12/17  Yes Byrum, Rose Fillers, MD  amLODipine (NORVASC) 2.5 MG tablet TAKE ONE TABLET BY MOUTH ONE TIME DAILY  07/10/19  Yes [name], MD  amLODipine (NORVASC) 5 MG tablet Take 1 tablet  (5 mg total) by mouth daily. 06/02/19  Yes [name], MD  aspirin 81 MG chewable tablet Chew 1 tablet (81 mg total) by mouth 2 (two) times daily for 28 days. Then discontinue Aspirin. 06/18/19 07/16/19 Yes Maurice March, PA-C  azelastine (OPTIVAR) 0.05 % ophthalmic solution Place 1 drop into both eyes 2 (two) times daily as needed. For itching/allergy symptoms 04/22/18  Yes [name], MD  celecoxib (CELEBREX) 200 MG capsule Take 1 capsule (200 mg total) by mouth 2 (two) times daily. 06/18/19  Yes Maurice March, PA-C  Cholecalciferol (VITAMIN D3) 50 MCG (2000 UT) TABS Take 2,000 Units by mouth daily.   Yes [provider]  cyclobenzaprine (FLEXERIL) 5 MG tablet take one tablet by mouth three times daily as needed for muscle spasm. start at bedtime due to sedation Patient taking differently: Take 5 mg by mouth 3 (three) times daily as needed for muscle spasms.  04/04/19  Yes [name], MD  docusate sodium (COLACE) 100  MG capsule Take 1 capsule (100 mg total) by mouth 2 (two) times daily. 06/18/19  Yes Maurice March, PA-C  esomeprazole (NEXIUM) 40 MG capsule TAKE 1 CAPSULE BY MOUTH DAILY 11/30/17  Yes [name], MD  fluticasone (KLS ALLER-FLO) 50 MCG/ACT nasal spray Place 2 sprays into both nostrils daily. 08/19/18  Yes [name], MD  HYDROcodone-acetaminophen (NORCO) 7.5-325 MG tablet Take 1-2 tablets by mouth every 4 (four) hours as needed for severe pain (pain score 7-10). 06/18/19  Yes Stinson, Nicola Girt, PA-C  INCRUSE ELLIPTA 62.5 MCG/INH AEPB inhale 1 puff into lungs once daily Patient taking differently: Inhale 1 puff into the lungs daily.  01/09/19  Yes Byrum, Rose Fillers, MD  KLS ALLERCLEAR 10 MG tablet TAKE 1 TABLET BY MOUTH DAILY Patient taking differently: Take 10 mg by mouth daily as needed for allergies.  08/15/17  Yes Collene Gobble, MD  KLS ESOMEPRAZOLE MAGNESIUM 20 MG capsule TAKE TWO CAPSULES BY MOUTH DAILY  06/04/19  Yes [name],  MD  Menthol-Methyl Salicylate (SALONPAS PAIN RELIEF PATCH EX) Place 1 patch onto the skin daily as needed (pain.).   Yes [provider]  methocarbamol (ROBAXIN) 500 MG tablet Take 1 tablet (500 mg total) by mouth every 6 (six) hours as needed for muscle spasms. 06/18/19  Yes Maurice March, PA-C  Naphazoline-Pheniramine (OPCON-A) 0.027-0.315 % SOLN Apply 1 drop to eye 2 (two) times daily as needed (dry/irritated eyes).   Yes [provider]  polyethylene glycol (MIRALAX / GLYCOLAX) 17 g packet Take 17 g by mouth 2 (two) times daily. 06/18/19  Yes Maurice March, PA-C  pravastatin (PRAVACHOL) 20 MG tablet Take 1 tablet (20 mg total) by mouth daily. 05/19/19  Yes [name], MD  triamcinolone cream (KENALOG) 0.1 % Apply 1 application topically 2 (two) times daily as needed. To affected areas only as needed. Patient taking differently: Apply 1 application topically 2 (two) times daily as needed (rash).  01/08/19  Yes [name], MD  ferrous sulfate 325 (65 FE) MG tablet Take 1 tablet (325 mg total) by mouth 2 (two) times daily with a meal for 14 days. 06/18/19 07/02/19  Maurice March, PA-C   Social History   Socioeconomic History   Marital status: Married    Spouse name: Not on file   Number of children: Not on file   Years of education: Not on file   Highest education level: Not on file  Occupational History   Not on file  Social Needs   Financial resource strain: Not on file   Food insecurity    Worry: Not on file    Inability: Not on file   Transportation needs    Medical: Not on file    Non-medical: Not on file  Tobacco Use   Smoking status: Former Smoker    Packs/day: 1.50    Years: 20.00    Pack years: 30.00    Types: Cigarettes    Quit date: 11/05/1999    Years since quitting: 19.7   Smokeless tobacco: Never Used  Substance and Sexual Activity   Alcohol use: No    Comment: last used 25 years ago    Drug use: Yes    Types:  Cocaine    Comment: last used cocaine  and acid 25 yrs ago,none used since   Sexual activity: Not on file  Lifestyle   Physical activity    Days per week: Not on file  Minutes per session: Not on file   Stress: Not on file  Relationships   Social connections    Talks on phone: Not on file    Gets together: Not on file    Attends religious service: Not on file    Active member of club or organization: Not on file    Attends meetings of clubs or organizations: Not on file    Relationship status: Not on file   Intimate partner violence    Fear of current or ex partner: Not on file    Emotionally abused: Not on file    Physically abused: Not on file    Forced sexual activity: Not on file  Other Topics Concern   Not on file  Social History Narrative   Not on file    Review of Systems  Constitutional: Negative for fatigue and unexpected weight change.  Eyes: Negative for visual disturbance.  Respiratory: Negative for cough, chest tightness and shortness of breath.   Cardiovascular: Negative for chest pain, palpitations and leg swelling.  Gastrointestinal: Negative for abdominal pain and blood in stool.  Neurological: Negative for dizziness, light-headedness and headaches.       Objective:   Physical Exam Vitals signs reviewed.  Constitutional:      Appearance: He is well-developed.  HENT:     Head: Normocephalic and atraumatic.  Eyes:     Pupils: Pupils are equal, round, and reactive to light.  Neck:     Vascular: No carotid bruit or JVD.  Cardiovascular:     Rate and Rhythm: Normal rate and regular rhythm.     Heart sounds: Normal heart sounds. No murmur.  Pulmonary:     Effort: Pulmonary effort is normal.     Breath sounds: Normal breath sounds. No rales.  Abdominal:    Skin:    General: Skin is warm and dry.  Neurological:     Mental Status: He is alert and oriented to person, place, and time.    Vitals:   07/14/19 1118  BP: 134/80  Pulse: 100    Temp: 98.4 F (36.9 C)  TempSrc: Oral  SpO2: 96%  Weight: 225 lb (102.1 kg)       Assessment & Plan:   [name] is a 68 y.o. male Essential hypertension - Plan: Comprehensive metabolic panel, amLODipine (NORVASC) 2.5 MG tablet, amLODipine (NORVASC) 5 MG tablet  - Stable, tolerating current regimen. Medications refilled. Labs pending as above.   Need for prophylactic vaccination against Streptococcus pneumoniae (pneumococcus) - Plan: Pneumococcal polysaccharide vaccine 23-valent greater than or equal to 2yo subcutaneous/IM  Hyperlipidemia, unspecified hyperlipidemia type - Plan: Lipid panel, Comprehensive metabolic panel, pravastatin (PRAVACHOL) 20 MG tablet  -Tolerating current dose, continue same for now but option of higher dosing depending on lipid panel  Allergic rhinitis, unspecified seasonality, unspecified trigger  -Stable, continue same regimen  Chronic obstructive pulmonary disease, unspecified COPD type ([place])  -Stable, but routine follow-up recommended with pulmonary.  History of right inguinal hernia - Plan: Ambulatory referral to General Surgery Right groin pain - Plan: Ambulatory referral to General Surgery  -No appreciable hernia palpated, but we will have him follow-up with previous surgeon to evaluate further.  Possible pain from scar tissue.  Allergic conjunctivitis, unspecified laterality - Plan: azelastine (OPTIVAR) 0.05 % ophthalmic solution   Meds ordered this encounter  Medications   amLODipine (NORVASC) 2.5 MG tablet    Sig: Take 1 tablet (2.5 mg total) by mouth daily. For total dose 7.5mg   Dispense:  90 tablet    Refill:  1   amLODipine (NORVASC) 5 MG tablet    Sig: Take 1 tablet (5 mg total) by mouth daily.    Dispense:  90 tablet    Refill:  1   esomeprazole (NEXIUM) 40 MG capsule    Sig: Take 1 capsule (40 mg total) by mouth daily.    Dispense:  90 capsule    Refill:  1   azelastine (OPTIVAR) 0.05 % ophthalmic solution    Sig:  Place 1 drop into both eyes 2 (two) times daily as needed. For itching/allergy symptoms    Dispense:  6 mL    Refill:  1   fluticasone (KLS ALLER-FLO) 50 MCG/ACT nasal spray    Sig: Place 2 sprays into both nostrils daily.    Dispense:  16 g    Refill:  5    Per pharmacy-ins covers only [place] aller-flo   pravastatin (PRAVACHOL) 20 MG tablet    Sig: Take 1 tablet (20 mg total) by mouth daily.    Dispense:  90 tablet    Refill:  1   Patient Instructions    No change in BP meds.  Call Dr. Agustina Caroli office for appointment, but sounds like COPD is stable.  No med changes for now, but I will look at cholesterol levels.  I will refer you to Dr. [name] to evaluate the groin pain.  Return to the clinic or go to the nearest emergency room if any of your symptoms worsen or new symptoms occur.      If you have lab work done today you will be contacted with your lab results within the next 2 weeks.  If you have not heard from [place] then please contact us. The fastest way to get your results is to register for My Chart.   IF you received an x-ray today, you will receive an invoice from [facility]. Please contact [facility] at [phone] with questions or concerns regarding your invoice.   IF you received labwork today, you will receive an invoice from [place]. Please contact [facility] at [phone] with questions or concerns regarding your invoice.   Our billing staff will not be able to assist you with questions regarding bills from these companies.  You will be contacted with the lab results as soon as they are available. The fastest way to get your results is to activate your My Chart account. Instructions are located on the last page of this paperwork. If you have not heard from [place] regarding the results in 2 weeks, please contact this office.       Signed,   [name], MD [facility].  07/14/19 7:21 PM

## 2019-07-14 NOTE — Patient Instructions (Addendum)
  No change in BP meds.  Call Dr. Agustina Caroli office for appointment, but sounds like COPD is stable.  No med changes for now, but I will look at cholesterol levels.  I will refer you to Dr. [name] to evaluate the groin pain.  Return to the clinic or go to the nearest emergency room if any of your symptoms worsen or new symptoms occur.      If you have lab work done today you will be contacted with your lab results within the next 2 weeks.  If you have not heard from [place] then please contact us. The fastest way to get your results is to register for My Chart.   IF you received an x-ray today, you will receive an invoice from [facility]. Please contact [facility] at [phone] with questions or concerns regarding your invoice.   IF you received labwork today, you will receive an invoice from [place]. Please contact [facility] at [phone] with questions or concerns regarding your invoice.   Our billing staff will not be able to assist you with questions regarding bills from these companies.  You will be contacted with the lab results as soon as they are available. The fastest way to get your results is to activate your My Chart account. Instructions are located on the last page of this paperwork. If you have not heard from [place] regarding the results in 2 weeks, please contact this office.

## 2019-07-15 LAB — COMPREHENSIVE METABOLIC PANEL
ALT: 20 IU/L (ref 0–44)
AST: 13 IU/L (ref 0–40)
Albumin/Globulin Ratio: 2.1 (ref 1.2–2.2)
Albumin: 4.7 g/dL (ref 3.8–4.8)
Alkaline Phosphatase: 124 IU/L — ABNORMAL HIGH (ref 39–117)
BUN/Creatinine Ratio: 17 (ref 10–24)
BUN: 14 mg/dL (ref 8–27)
Bilirubin Total: 0.6 mg/dL (ref 0.0–1.2)
CO2: 21 mmol/L (ref 20–29)
Calcium: 10 mg/dL (ref 8.6–10.2)
Chloride: 101 mmol/L (ref 96–106)
Creatinine, Ser: 0.83 mg/dL (ref 0.76–1.27)
GFR calc Af Amer: 105 mL/min/{1.73_m2} (ref >59)
GFR calc non Af Amer: 91 mL/min/{1.73_m2} (ref >59)
Globulin, Total: 2.2 g/dL (ref 1.5–4.5)
Glucose: 99 mg/dL (ref 65–99)
Potassium: 4.7 mmol/L (ref 3.5–5.2)
Sodium: 138 mmol/L (ref 134–144)
Total Protein: 6.9 g/dL (ref 6.0–8.5)

## 2019-07-15 LAB — LIPID PANEL
Chol/HDL Ratio: 4.5 ratio (ref 0.0–5.0)
Cholesterol, Total: 159 mg/dL (ref 100–199)
HDL: 35 mg/dL — ABNORMAL LOW (ref >39)
LDL Chol Calc (NIH): 95 mg/dL (ref 0–99)
Triglycerides: 167 mg/dL — ABNORMAL HIGH (ref 0–149)
VLDL Cholesterol Cal: 29 mg/dL (ref 5–40)

## 2019-07-16 ENCOUNTER — Telehealth: Payer: Self-pay | Admitting: Family Medicine

## 2019-07-16 NOTE — Telephone Encounter (Signed)
[name] with [facility] calling and states that the quantity of this prescription needs to be increased due to patient being an employee of [employer], they have to use OTC products. Are requesting quantity be changed to 168 tablets. Please advise.

## 2019-07-25 NOTE — Telephone Encounter (Signed)
Spoke to pharmacy and verbalized okay with the OTC Rx. They stated understanding.

## 2019-07-28 ENCOUNTER — Encounter: Payer: Self-pay | Admitting: Radiology

## 2019-07-28 ENCOUNTER — Telehealth: Payer: Self-pay

## 2019-07-28 NOTE — Telephone Encounter (Signed)
Called pt and informed him of his lab results, he verbalized understanding.

## 2019-08-29 ENCOUNTER — Other Ambulatory Visit: Payer: Self-pay

## 2019-08-29 ENCOUNTER — Ambulatory Visit: Payer: Managed Care, Other (non HMO) | Admitting: Emergency Medicine

## 2019-08-29 ENCOUNTER — Encounter: Payer: Self-pay | Admitting: Emergency Medicine

## 2019-08-29 DIAGNOSIS — K219 Gastro-esophageal reflux disease without esophagitis: Secondary | ICD-10-CM

## 2019-08-29 DIAGNOSIS — J449 Chronic obstructive pulmonary disease, unspecified: Secondary | ICD-10-CM

## 2019-08-29 DIAGNOSIS — J309 Allergic rhinitis, unspecified: Secondary | ICD-10-CM

## 2019-08-29 MED ORDER — ANORO ELLIPTA 62.5-25 MCG/INH IN AEPB: 1.0000 | INHALATION_SPRAY | Freq: Every day | RESPIRATORY_TRACT | 0 refills | Status: DC

## 2019-08-29 NOTE — Assessment & Plan Note (Signed)
More dyspnea over the last several days.  There may be a component of deconditioning here but I like to try him on combined therapy to see if he gets more benefit from the Incruse alone.  He has had difficulty with thrush and mouth soreness in the past and hopefully he will be able to tolerate.  Temporarily stop Incruse. Try starting Anoro 1 inhalation once daily for the next month.  If this helps your breathing then please call our office and let us know.  We can send a prescription for this to your pharmacy. Keep your albuterol available to use 2 puffs if needed for shortness of breath, chest tightness, wheezing. Your pneumonia shot and flu shot are both up-to-date. Follow with Dr. [name] in 12 months or sooner if you have any problems.

## 2019-08-29 NOTE — Patient Instructions (Addendum)
Temporarily stop Incruse. Try starting Anoro 1 inhalation once daily for the next month.  If this helps your breathing then please call our office and let us know.  We can send a prescription for this to your pharmacy. Keep your albuterol available to use 2 puffs if needed for shortness of breath, chest tightness, wheezing. Continue your esomeprazole 40 mg once daily.  Try to be careful about eating spicy foods or eating after 8 PM.  Increased reflux can cause more cough. You might benefit from restarting your Flonase nasal spray, 2 sprays each nostril every day.  You may also benefit from restarting your Allerclear once daily. This could help cough. Your pneumonia shot and flu shot are both up-to-date. Follow with Dr. [name] in 12 months or sooner if you have any problems.

## 2019-08-29 NOTE — Assessment & Plan Note (Signed)
Continue your esomeprazole 40 mg once daily.  Try to be careful about eating spicy foods or eating after 8 PM.  Increased reflux can cause more cough.

## 2019-08-29 NOTE — Progress Notes (Signed)
Subjective:    Patient ID: [name], male    DOB: 31-Mar-1951, 68 y.o.   MRN: [id number]  HPI  ROV 06/10/18 --68 year old former smoker (30 pack years) with a history of COPD, benign pulmonary nodules that we followed with serial CT scans of the chest.  He has some chronic cough in the setting of GERD, chronic rhinitis.  He is also been dealing with mouth soreness and tongue soreness that have been thought to possibly been related to thrush versus irritation from his medications. Last time we started him on Incruse, he feels that he is benefiting, breathing well. He is not having any increased cough or mouth sx on it so far. No wheeze. No flares since last time. He is using flonase daily, generic esomeprazole 40mg  daily.   ROV 08/29/2019 --this is an annual follow-up visit for 68 year old gentleman with a history of COPD and benign pulmonary nodules that were followed with serial CT scans of the chest.  He has some chronic cough in the setting of GERD and chronic rhinitis.  We have been managing him on Incruse, Flonase prn, esomeprazole 40 mg daily. He has been experiencing increase dyspnea, especially in the am or evening when he is laying down. He hears some wheeze at those times as well. His exertional tolerance may be a bit down. He has some dry cough - intermittent GERD even on esomeprazole. He had Prevnar last year, flu shot up to date.    Review of Systems  Constitutional: Negative for fever and unexpected weight change.  HENT: Negative for congestion, dental problem, ear pain, nosebleeds, postnasal drip, rhinorrhea, sinus pressure, sneezing, sore throat and trouble swallowing.   Eyes: Negative for redness and itching.  Respiratory: Positive for shortness of breath and wheezing. Negative for chest tightness.   Cardiovascular: Negative for palpitations and leg swelling.       Knees   Gastrointestinal: Negative for nausea and vomiting.  Genitourinary: Negative for dysuria.    Musculoskeletal: Negative for joint swelling.  Skin: Negative for rash.  Neurological: Negative for headaches.  Hematological: Does not bruise/bleed easily.  Psychiatric/Behavioral: Negative for dysphoric mood. The patient is not nervous/anxious.         Objective:   Physical Exam Vitals:   08/29/19 0914  BP: 136/82  Pulse: 87  SpO2: 98%  Weight: 225 lb (102.1 kg)  Height: 5\' 11"  (1.803 m)   Gen: Pleasant, well-nourished, in no distress,  normal affect  ENT: No lesions,  mouth clear,  oropharynx clear, no postnasal drip  Neck: No JVD, no stridor  Lungs: No use of accessory muscles, clear without rales or rhonchi  Cardiovascular: RRR, heart sounds normal, no murmur or gallops, no peripheral edema  Musculoskeletal: No deformities, no cyanosis or clubbing  Neuro: alert, non focal  Skin: Warm, no lesions or rash  07/13/14 --  COMPARISON: 01/05/2014  FINDINGS: Mediastinum: Normal heart size. No pericardial effusion. The trachea appears patent and is midline. Normal appearance of the esophagus. No mediastinal or hilar adenopathy identified.  Lungs/Pleura: There is no pleural effusion identified. Index nodule in the right upper lobe measures 4 mm and is unchanged from previous exam. Subpleural nodule within the anterior right upper lobe is unchanged measuring 4 mm, image 24/series 3. Subpleural right middle lobe nodule is unchanged measuring 3 mm, image 33/series 3. No new or enlarging pulmonary nodules or mass is identified. Sub solid lesion within the right base measures 9 mm, image 105 of series 602. This is stable  when compared with 01/05/2014 but is new from 07/04/2013.  Upper Abdomen: The visualized portions of the liver, gallbladder, and pancreas are unremarkable. The spleen is negative.  Musculoskeletal: Review of the visualized bony structures is on unremarkable. No aggressive lytic or sclerotic bone lesions.  IMPRESSION: 1. Stable small solid  pulmonary nodules. Compatible with benign lesions. 2. There is a 9 mm sub solid lesion in the right base which is unchanged from 01/05/2014 but new from 07/04/2013. This will need annual surveillance for minimum of 36 months to document of benignity. The next followup examination should be obtained on 07/14/2015. A PET-CT would be of limited value in evaluating this lesion. This recommendation follows the consensus statement: Recommendations for the Management of Subsolid Pulmonary Nodules Detected at CT: A Statement from the [place] as published in Radiology 2013; 266:304-317.     Assessment & Plan:  COPD, mild More dyspnea over the last several days.  There may be a component of deconditioning here but I like to try him on combined therapy to see if he gets more benefit from the Incruse alone.  He has had difficulty with thrush and mouth soreness in the past and hopefully he will be able to tolerate.  Temporarily stop Incruse. Try starting Anoro 1 inhalation once daily for the next month.  If this helps your breathing then please call our office and let us know.  We can send a prescription for this to your pharmacy. Keep your albuterol available to use 2 puffs if needed for shortness of breath, chest tightness, wheezing. Your pneumonia shot and flu shot are both up-to-date. Follow with Dr. [name] in 12 months or sooner if you have any problems.   Allergic rhinitis You might benefit from restarting your Flonase nasal spray, 2 sprays each nostril every day.  You may also benefit from restarting your Allerclear once daily. This could help cough.  GERD (gastroesophageal reflux disease) Continue your esomeprazole 40 mg once daily.  Try to be careful about eating spicy foods or eating after 8 PM.  Increased reflux can cause more cough.  Baltazar Apo, MD, PhD 08/29/2019, 9:37 AM [facility] [phone] or if no answer [phone]

## 2019-08-29 NOTE — Assessment & Plan Note (Signed)
You might benefit from restarting your Flonase nasal spray, 2 sprays each nostril every day.  You may also benefit from restarting your Allerclear once daily. This could help cough.

## 2019-09-08 ENCOUNTER — Telehealth: Payer: Self-pay | Admitting: Emergency Medicine

## 2019-09-08 MED ORDER — ANORO ELLIPTA 62.5-25 MCG/INH IN AEPB: 1.0000 | INHALATION_SPRAY | Freq: Every day | RESPIRATORY_TRACT | 3 refills | Status: DC

## 2019-09-08 NOTE — Telephone Encounter (Signed)
I called pt but there was no answer. Unable to leave message because mailbox was full. Rx for Anoro sent to [facility].

## 2019-09-09 NOTE — Telephone Encounter (Signed)
Called and spoke with pt letting him know that we sent Rx for anoro to pharmacy. Pt verbalized understanding.nothing further needed.

## 2019-10-08 ENCOUNTER — Encounter (HOSPITAL_COMMUNITY): Payer: Self-pay | Admitting: Family Medicine

## 2019-10-08 ENCOUNTER — Other Ambulatory Visit: Payer: Self-pay

## 2019-10-08 ENCOUNTER — Emergency Department (HOSPITAL_COMMUNITY)
Admission: EM | Admit: 2019-10-08 | Discharge: 2019-10-08 | Disposition: A | Discharge status: Home / Self Care | Payer: Managed Care, Other (non HMO) | Attending: Emergency Medicine | Admitting: Emergency Medicine

## 2019-10-08 DIAGNOSIS — Z87891 Personal history of nicotine dependence: Secondary | ICD-10-CM | POA: Insufficient documentation

## 2019-10-08 DIAGNOSIS — W25XXXA Contact with sharp glass, initial encounter: Secondary | ICD-10-CM | POA: Diagnosis not present

## 2019-10-08 DIAGNOSIS — Y929 Unspecified place or not applicable: Secondary | ICD-10-CM | POA: Diagnosis not present

## 2019-10-08 DIAGNOSIS — S61012A Laceration without foreign body of left thumb without damage to nail, initial encounter: Secondary | ICD-10-CM | POA: Insufficient documentation

## 2019-10-08 DIAGNOSIS — Z79899 Other long term (current) drug therapy: Secondary | ICD-10-CM | POA: Diagnosis not present

## 2019-10-08 DIAGNOSIS — Y939 Activity, unspecified: Secondary | ICD-10-CM | POA: Insufficient documentation

## 2019-10-08 DIAGNOSIS — Z23 Encounter for immunization: Secondary | ICD-10-CM | POA: Insufficient documentation

## 2019-10-08 DIAGNOSIS — I10 Essential (primary) hypertension: Secondary | ICD-10-CM | POA: Insufficient documentation

## 2019-10-08 DIAGNOSIS — Z96653 Presence of artificial knee joint, bilateral: Secondary | ICD-10-CM | POA: Insufficient documentation

## 2019-10-08 DIAGNOSIS — J449 Chronic obstructive pulmonary disease, unspecified: Secondary | ICD-10-CM | POA: Diagnosis not present

## 2019-10-08 DIAGNOSIS — Y999 Unspecified external cause status: Secondary | ICD-10-CM | POA: Diagnosis not present

## 2019-10-08 MED ORDER — TETANUS-DIPHTH-ACELL PERTUSSIS 5-2.5-18.5 LF-MCG/0.5 IM SUSP
0.5000 mL | Freq: Once | INTRAMUSCULAR | Status: AC
  Administered 2019-10-08: 16:00:00 0.5 mL via INTRAMUSCULAR
  Filled 2019-10-08: qty 0.5

## 2019-10-08 MED ORDER — IBUPROFEN 200 MG PO TABS
600.0000 mg | ORAL_TABLET | Freq: Once | ORAL | Status: AC
  Administered 2019-10-08: 600 mg via ORAL
  Filled 2019-10-08: qty 3

## 2019-10-08 NOTE — Discharge Instructions (Signed)
Adhesive tape repair Tape strips have the advantage of being less painful to apply and lower risk of infection, but do require more caution on your part, as they are more fragile than other skin closure techniques.  It's important to: -Keep the tape clean and dry -Avoid picking at the tape or rubbing the area -Avoid soaking in water (showering is okay-bathing is not) -The tape strips will fall off on their own in about 5-7 days (if they don't, you can gently remove them or soak the wound in water at this time to loosen them).  At this time, scar tissue will be forming under the surface of the wound and your body will do the rest of the work of healing.

## 2019-10-08 NOTE — ED Triage Notes (Signed)
Patient has a left thumb laceration from globe. Bleeding controlled with pressure. Not UTD with tetanus.

## 2019-10-08 NOTE — ED Provider Notes (Signed)
[facility] DEPT Provider Note   CSN: [id number] Arrival date & time: 10/08/19  1545     History Chief Complaint  Patient presents with  . Laceration    [name] is a 69 y.o. male.  HPI  Patient with small left thumb laceration that occurred prior to arrival less than 8 hours ago.  Bleeding was controlled with direct pressure.  Patient states he is unsure when his last tetanus was but states that it may have been over 10 years ago.  Endorses some mild constant throbbing sensation with moderate pain.  Denies any lightheadedness or dizziness.  States that he cut himself with a snow globe that had a sharp edge on it.  No other injuries.     Past Medical History:  Diagnosis Date  . Allergy    seasonal  . Arthritis    oa  . Asthma   . COPD (chronic obstructive pulmonary disease) ([place])   . GERD (gastroesophageal reflux disease)   . Hypertension   . Prostate cancer ([place]) 01/2016   prostatectomy   . Shortness of breath dyspnea     Patient Active Problem List   Diagnosis Date Noted  . S/P left TKA 06/17/2019  . 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 Allergic rhinitis 03/12/2017  . Thrush, oral 02/27/2017  . SUI (stress urinary incontinence), male 03/20/2016  . Prostate cancer ([place]) 02/09/2016  . Obese 09/08/2015  . S/P right TKA 09/06/2015  . S/P knee replacement 09/06/2015  . COPD, mild ([place]) 07/20/2014  . Pulmonary nodules 02/23/2014  . Tobacco use disorder, moderate, in sustained remission 02/23/2014  . Recurrent right inguinal hernia 10/20/2013  . ED (erectile dysfunction) 08/26/2012  . Physical exam, annual 11/05/2011  . HTN (hypertension) 11/05/2011  . GERD (gastroesophageal reflux disease) 11/05/2011  . Chronic joint pain 11/05/2011    Past Surgical History:  Procedure Laterality Date  . HERNIA REPAIR  last done 3-4 yrs ago   x 2  . INGUINAL HERNIA REPAIR Right 11/20/2013   Procedure: OPEN REPAIR OF  RECURRENT RIGHT INGUINAL HERNIA WITH MESH;  Surgeon: [name]. [name], MD;  Location: [facility];  Service: General;  Laterality: Right;  . INSERTION OF MESH Right 11/20/2013   Procedure: INSERTION OF MESH;  Surgeon: [name]. [name], MD;  Location: [facility];  Service: General;  Laterality: Right;  . KNEE SURGERY Left july 2014   mcl and meniscus repair  . LYMPHADENECTOMY Bilateral 02/09/2016   Procedure: PELVIC LYMPHADENECTOMY;  Surgeon: [name], MD;  Location: [facility];  Service: Urology;  Laterality: Bilateral;  . right knee arthroscopy     . ROBOT ASSISTED LAPAROSCOPIC RADICAL PROSTATECTOMY N/A 02/09/2016   Procedure: XI ROBOTIC ASSISTED LAPAROSCOPIC RADICAL PROSTATECTOMY WITH INDOCYANINE GREEN DYE AND ADHESIOLYSIS;  Surgeon: [name], MD;  Location: [facility];  Service: Urology;  Laterality: N/A;  . TOTAL KNEE ARTHROPLASTY Right 09/06/2015   Procedure: RIGHT TOTAL KNEE ARTHROPLASTY;  Surgeon: [name], MD;  Location: [facility];  Service: Orthopedics;  Laterality: Right;  . TOTAL KNEE ARTHROPLASTY Left 06/17/2019   Procedure: TOTAL KNEE ARTHROPLASTY;  Surgeon: [name], MD;  Location: [facility];  Service: Orthopedics;  Laterality: Left;  70 mins       Family History  Problem Relation Age of Onset  . Cancer Mother   . Cancer Father   . Cancer Sister   . Arthritis Sister   . Cancer Maternal Grandmother   . Heart disease Maternal Grandfather   .  Stroke Maternal Grandfather     Social History   Tobacco Use  . Smoking status: Former Smoker    Packs/day: 1.50    Years: 20.00    Pack years: 30.00    Types: Cigarettes    Quit date: 11/05/1999    Years since quitting: 19.9  . Smokeless tobacco: Never Used  Substance Use Topics  . Alcohol use: No    Comment: last used 25 years ago   . Drug use: Yes    Types: Cocaine    Comment: last used cocaine  and acid 25 yrs ago,none used since    Home Medications Prior to Admission medications   Medication Sig Start Date End Date  Taking? Authorizing Provider  albuterol (PROVENTIL HFA;VENTOLIN HFA) 108 (90 Base) MCG/ACT inhaler Inhale 2 puffs into the lungs every 6 (six) hours as needed for wheezing or shortness of breath. 03/12/17   Byrum, Rose Fillers, MD  amLODipine (NORVASC) 2.5 MG tablet Take 1 tablet (2.5 mg total) by mouth daily. For total dose 7.5mg  07/14/19   [name], MD  amLODipine (NORVASC) 5 MG tablet Take 1 tablet (5 mg total) by mouth daily. 07/14/19   [name], MD  azelastine (OPTIVAR) 0.05 % ophthalmic solution Place 1 drop into both eyes 2 (two) times daily as needed. For itching/allergy symptoms 07/14/19   [name], MD  celecoxib (CELEBREX) 200 MG capsule Take 1 capsule (200 mg total) by mouth 2 (two) times daily. 06/18/19   Maurice March, PA-C  Cholecalciferol (VITAMIN D3) 50 MCG (2000 UT) TABS Take 2,000 Units by mouth daily.    [provider]  cyclobenzaprine (FLEXERIL) 5 MG tablet take one tablet by mouth three times daily as needed for muscle spasm. start at bedtime due to sedation Patient taking differently: Take 5 mg by mouth 3 (three) times daily as needed for muscle spasms.  04/04/19   [name], MD  docusate sodium (COLACE) 100 MG capsule Take 1 capsule (100 mg total) by mouth 2 (two) times daily. 06/18/19   Maurice March, PA-C  esomeprazole (NEXIUM) 40 MG capsule Take 1 capsule (40 mg total) by mouth daily. 07/14/19   [name], MD  ferrous sulfate 325 (65 FE) MG tablet Take 1 tablet (325 mg total) by mouth 2 (two) times daily with a meal for 14 days. 06/18/19 07/02/19  Maurice March, PA-C  fluticasone (KLS ALLER-FLO) 50 MCG/ACT nasal spray Place 2 sprays into both nostrils daily. 07/14/19   [name], MD  HYDROcodone-acetaminophen (NORCO) 7.5-325 MG tablet Take 1-2 tablets by mouth every 4 (four) hours as needed for severe pain (pain score 7-10). 06/18/19   Maurice March, PA-C  INCRUSE ELLIPTA 62.5 MCG/INH AEPB inhale 1 puff into  lungs once daily Patient taking differently: Inhale 1 puff into the lungs daily.  01/09/19   Byrum, Rose Fillers, MD  KLS ALLERCLEAR 10 MG tablet TAKE 1 TABLET BY MOUTH DAILY Patient taking differently: Take 10 mg by mouth daily as needed for allergies.  08/15/17   Collene Gobble, MD  Menthol-Methyl Salicylate (SALONPAS PAIN RELIEF PATCH EX) Place 1 patch onto the skin daily as needed (pain.).    [provider]  methocarbamol (ROBAXIN) 500 MG tablet Take 1 tablet (500 mg total) by mouth every 6 (six) hours as needed for muscle spasms. 06/18/19   Maurice March, PA-C  Naphazoline-Pheniramine (OPCON-A) 0.027-0.315 % SOLN Apply 1 drop to eye 2 (two) times daily as  needed (dry/irritated eyes).    [provider]  polyethylene glycol (MIRALAX / GLYCOLAX) 17 g packet Take 17 g by mouth 2 (two) times daily. 06/18/19   Maurice March, PA-C  pravastatin (PRAVACHOL) 20 MG tablet Take 1 tablet (20 mg total) by mouth daily. 07/14/19   [name], MD  triamcinolone cream (KENALOG) 0.1 % Apply 1 application topically 2 (two) times daily as needed. To affected areas only as needed. Patient taking differently: Apply 1 application topically 2 (two) times daily as needed (rash).  01/08/19   [name], MD  umeclidinium-vilanterol (ANORO ELLIPTA) 62.5-25 MCG/INH AEPB Inhale 1 puff into the lungs daily. 09/08/19   Collene Gobble, MD    Allergies    Percocet [oxycodone-acetaminophen] and Amoxicillin  Review of Systems   Review of Systems  Constitutional: Negative for chills, fatigue and fever.  HENT: Negative for congestion.   Respiratory: Negative for shortness of breath.   Cardiovascular: Negative for chest pain.  Gastrointestinal: Negative for abdominal pain.  Musculoskeletal: Negative for neck pain.  Skin:       Skin laceration thumb    Physical Exam Updated Vital Signs BP (!) 168/99 (BP Location: Right Arm)   Pulse 99   Temp 98.9 F (37.2 C) (Oral)   Resp 18    SpO2 96%   Physical Exam Vitals and nursing note reviewed.  Constitutional:      General: He is not in acute distress.    Appearance: Normal appearance. He is not ill-appearing.     Comments: No acute distress  HENT:     Head: Normocephalic and atraumatic.  Eyes:     General: No scleral icterus.       Right eye: No discharge.        Left eye: No discharge.     Conjunctiva/sclera: Conjunctivae normal.  Pulmonary:     Effort: Pulmonary effort is normal.     Breath sounds: No stridor.  Musculoskeletal:     Comments: Full range of motion of both thumbs  Skin:    Capillary Refill: Capillary refill takes less than 2 seconds. Cap refill intact bilateral thumbs    Comments: 1 cm laceration to the palmar pad of distal phalanx of thumb nongaping with small amount of bleeding.  Controlled with direct pressure.  Neurological:     Mental Status: He is alert and oriented to person, place, and time. Mental status is at baseline.     Comments: Sensation intact bilateral thumbs     ED Results / Procedures / Treatments   Labs (all labs ordered are listed, but only abnormal results are displayed) Labs Reviewed - No data to display  EKG None  Radiology No results found.  Procedures .[name] KitchenLaceration Repair  Date/Time: 10/08/2019 5:00 PM Performed by: Tedd Sias, PA Authorized by: Tedd Sias, PA   Consent:    Consent obtained:  Verbal   Consent given by:  Patient   Risks discussed:  Infection, need for additional repair, poor cosmetic result, poor wound healing and pain   Alternatives discussed:  No treatment and delayed treatment Universal protocol:    Procedure explained and questions answered to patient or proxy's satisfaction: yes     Relevant documents present and verified: yes     Test results available and properly labeled: yes     Imaging studies available: yes     Required blood products, implants, devices, and special equipment available: yes     Site/side marked:  yes  Immediately prior to procedure, a time out was called: yes     Patient identity confirmed:  Verbally with patient Anesthesia (see MAR for exact dosages):    Anesthesia method:  None Laceration details:    Location:  Finger   Finger location:  L thumb   Length (cm):  1 Repair type:    Repair type:  Simple Exploration:    Hemostasis achieved with:  Direct pressure   Wound extent: no foreign bodies/material noted and no tendon damage noted     Contaminated: no   Treatment:    Area cleansed with:  Saline   Amount of cleaning:  Standard   Irrigation solution:  Sterile saline   Irrigation volume:  50 mL   Irrigation method:  Pressure wash   Visualized foreign bodies/material removed: no   Skin repair:    Repair method:  Steri-Strips   Number of Steri-Strips:  1 Approximation:    Approximation:  Close Post-procedure details:    Dressing:  Non-adherent dressing   Patient tolerance of procedure:  Tolerated well, no immediate complications Comments:     Dermabond applied over Steri-Strips   (including critical care time)  Medications Ordered in ED Medications  Tdap (BOOSTRIX) injection 0.5 mL (0.5 mLs Intramuscular Given 10/08/19 1613)  ibuprofen (ADVIL) tablet 600 mg (600 mg Oral Given 10/08/19 1612)    ED Course  I have reviewed the triage vital signs and the nursing notes.  Pertinent labs & imaging results that were available during my care of the patient were reviewed by me and considered in my medical decision making (see chart for details).  Clinical Course as of Oct 07 1698  Wed Oct 08, 2019  1643 Patient seen by myself as well as PA provider.  Briefly this gentleman presents with a small laceration to the tip of his left thumb today accidentally tried to pull up a sharp object.  He thought that his tetanus status.  He says he was initially bleeding but no longer.  He is not on blood thinners.  Wound is not large enough to close with sutures. No evidence of  infection or flexor tenosynovitis.  We will apply some Dermabond and discharge him.   [MT]    Clinical Course User Index [MT] Trifan, Carola Rhine, MD   MDM Rules/Calculators/A&P                      Pressure irrigation performed. Wound explored and base of wound visualized in a bloodless field without evidence of foreign body.  Laceration occurred < 8 hours prior to repair which was well tolerated. Tdap updated.  Pt has no comorbidities to effect normal wound healing. Pt discharged without antibiotics.  Discussed home care with patient and answered questions. they are to return to the ED sooner for signs of infection. Pt is hemodynamically stable with no complaints prior to dc.    Thumb repaired with Dermabond and 1 Steri-Strip.   Vital signs within normal with mildly elevated blood pressure.   Final Clinical Impression(s) / ED Diagnoses Final diagnoses:  Laceration of left thumb without foreign body without damage to nail, initial encounter    Rx / DC Orders ED Discharge Orders    None       Tedd Sias, [place] 10/08/19 1702    Wyvonnia Dusky, MD 10/08/19 [phone]

## 2019-11-13 ENCOUNTER — Ambulatory Visit: Payer: 59 | Attending: Internal Medicine

## 2019-11-13 DIAGNOSIS — Z23 Encounter for immunization: Secondary | ICD-10-CM | POA: Insufficient documentation

## 2019-11-13 NOTE — Progress Notes (Signed)
   Covid-19 Vaccination Clinic  Name:  [name]    MRN: [id number] DOB: 31-Jul-1951  11/13/2019  [name] was observed post Covid-19 immunization for 15 minutes without incidence. He was provided with Vaccine Information Sheet and instruction to access the V-Safe system.   [name] was instructed to call 911 with any severe reactions post vaccine: [name] Kitchen Difficulty breathing  . Swelling of your face and throat  . A fast heartbeat  . A bad rash all over your body  . Dizziness and weakness    Immunizations Administered    Name Date Dose VIS Date Route   Pfizer COVID-19 Vaccine 11/13/2019  3:25 PM 0.3 mL 08/29/2019 Intramuscular   Manufacturer: [place]   Lot: [id number]   [place]: [id number]

## 2019-12-09 ENCOUNTER — Ambulatory Visit: Payer: 59 | Attending: Internal Medicine

## 2019-12-09 DIAGNOSIS — Z23 Encounter for immunization: Secondary | ICD-10-CM

## 2019-12-09 NOTE — Progress Notes (Signed)
   Covid-19 Vaccination Clinic  Name:  [name]    MRN: [id number] DOB: [birth year]  12/09/2019  Mr. [name] was observed post Covid-19 immunization for 15 minutes without incident. He was provided with Vaccine Information Sheet and instruction to access the V-Safe system.   Mr. [name] was instructed to call 911 with any severe reactions post vaccine: [name] Kitchen Difficulty breathing  . Swelling of face and throat  . A fast heartbeat  . A bad rash all over body  . Dizziness and weakness   Immunizations Administered    Name Date Dose VIS Date Route   Pfizer COVID-19 Vaccine 12/09/2019  2:29 PM 0.3 mL 08/29/2019 Intramuscular   Manufacturer: [place]   Lot: [id number]   [place]: [id number]

## 2020-01-12 ENCOUNTER — Encounter: Payer: Self-pay | Admitting: Family Medicine

## 2020-01-12 ENCOUNTER — Other Ambulatory Visit: Payer: Self-pay

## 2020-01-12 ENCOUNTER — Ambulatory Visit (INDEPENDENT_AMBULATORY_CARE_PROVIDER_SITE_OTHER): Payer: 59 | Admitting: Family Medicine

## 2020-01-12 ENCOUNTER — Ambulatory Visit (INDEPENDENT_AMBULATORY_CARE_PROVIDER_SITE_OTHER): Payer: 59

## 2020-01-12 VITALS — BP 139/84 | HR 72 | Temp 99.0°F | Ht 71.0 in | Wt 221.0 lb

## 2020-01-12 DIAGNOSIS — R42 Dizziness and giddiness: Secondary | ICD-10-CM | POA: Diagnosis not present

## 2020-01-12 DIAGNOSIS — E785 Hyperlipidemia, unspecified: Secondary | ICD-10-CM

## 2020-01-12 DIAGNOSIS — G8929 Other chronic pain: Secondary | ICD-10-CM | POA: Diagnosis not present

## 2020-01-12 DIAGNOSIS — J309 Allergic rhinitis, unspecified: Secondary | ICD-10-CM

## 2020-01-12 DIAGNOSIS — I10 Essential (primary) hypertension: Secondary | ICD-10-CM

## 2020-01-12 DIAGNOSIS — M5442 Lumbago with sciatica, left side: Secondary | ICD-10-CM

## 2020-01-12 DIAGNOSIS — K219 Gastro-esophageal reflux disease without esophagitis: Secondary | ICD-10-CM

## 2020-01-12 DIAGNOSIS — M545 Low back pain, unspecified: Secondary | ICD-10-CM

## 2020-01-12 LAB — LIPID PANEL
Chol/HDL Ratio: 3.5 ratio (ref 0.0–5.0)
Cholesterol, Total: 145 mg/dL (ref 100–199)
HDL: 41 mg/dL (ref >39)
LDL Chol Calc (NIH): 86 mg/dL (ref 0–99)
Triglycerides: 96 mg/dL (ref 0–149)
VLDL Cholesterol Cal: 18 mg/dL (ref 5–40)

## 2020-01-12 LAB — COMPREHENSIVE METABOLIC PANEL
ALT: 24 IU/L (ref 0–44)
AST: 17 IU/L (ref 0–40)
Albumin/Globulin Ratio: 2.1 (ref 1.2–2.2)
Albumin: 4.5 g/dL (ref 3.8–4.8)
Alkaline Phosphatase: 115 IU/L (ref 39–117)
BUN/Creatinine Ratio: 12 (ref 10–24)
BUN: 10 mg/dL (ref 8–27)
Bilirubin Total: 0.5 mg/dL (ref 0.0–1.2)
CO2: 22 mmol/L (ref 20–29)
Calcium: 9.3 mg/dL (ref 8.6–10.2)
Chloride: 104 mmol/L (ref 96–106)
Creatinine, Ser: 0.86 mg/dL (ref 0.76–1.27)
GFR calc Af Amer: 103 mL/min/{1.73_m2} (ref >59)
GFR calc non Af Amer: 89 mL/min/{1.73_m2} (ref >59)
Globulin, Total: 2.1 g/dL (ref 1.5–4.5)
Glucose: 130 mg/dL — ABNORMAL HIGH (ref 65–99)
Potassium: 4.2 mmol/L (ref 3.5–5.2)
Sodium: 140 mmol/L (ref 134–144)
Total Protein: 6.6 g/dL (ref 6.0–8.5)

## 2020-01-12 LAB — CBC
Hematocrit: 48 % (ref 37.5–51.0)
Hemoglobin: 15.7 g/dL (ref 13.0–17.7)
MCH: 30.4 pg (ref 26.6–33.0)
MCHC: 32.7 g/dL (ref 31.5–35.7)
MCV: 93 fL (ref 79–97)
Platelets: 262 10*3/uL (ref 150–450)
RBC: 5.16 x10E6/uL (ref 4.14–5.80)
RDW: 13.3 % (ref 11.6–15.4)
WBC: 4.4 10*3/uL (ref 3.4–10.8)

## 2020-01-12 MED ORDER — CYCLOBENZAPRINE HCL 5 MG PO TABS: ORAL_TABLET | ORAL | 0 refills | Status: DC

## 2020-01-12 MED ORDER — FLUTICASONE PROPIONATE 50 MCG/ACT NA SUSP: 2.0000 | Freq: Every day | NASAL | 5 refills | Status: DC

## 2020-01-12 MED ORDER — AMLODIPINE BESYLATE 5 MG PO TABS: 5.0000 mg | ORAL_TABLET | Freq: Every day | ORAL | 1 refills | Status: DC

## 2020-01-12 MED ORDER — AMLODIPINE BESYLATE 2.5 MG PO TABS: 2.5000 mg | ORAL_TABLET | Freq: Every day | ORAL | 1 refills | Status: DC

## 2020-01-12 MED ORDER — ESOMEPRAZOLE MAGNESIUM 40 MG PO CPDR: 40.0000 mg | DELAYED_RELEASE_CAPSULE | Freq: Every day | ORAL | 1 refills | Status: DC

## 2020-01-12 NOTE — Progress Notes (Signed)
Subjective:  Patient ID: [name], male    DOB: [birth year]  Age: 69 y.o. MRN: [id number]  CC:  Chief Complaint  Patient presents with  . Hypertension    pt reports no known issues with his BP since last visit.  pt dosn't check his BP at home. pt dose reports some dizzieness, but no other physical symptoms of hypertension  . Back Pain    pt reports the pain has been happening at least once a month for the past few years, but as of 6 months ago pt states this pain has started coming up everry 2 weeks. pain lasts about 3-5 days before going away just to come back 2 weeks later. pain is in lower back. pain shooting and sharp with movment. 7/10 pain level.    HPI [name] presents for   Hypertension: Continued on amlodipine 7.5 mg total dosing at October 2020 visit. Home readings: none.  No new side effects. Some ankle swelling on left, prior knee surgery.  Some added salt. Processed foods.  Dizziness with certain quick movements at work. No syncope. Few bottles water per day. No CP/dyspena.  No focal weakness.  BP Readings from Last 3 Encounters:  01/12/20 139/84  10/08/19 (!) 158/101  08/29/19 136/82   Lab Results  Component Value Date   CREATININE 0.83 07/14/2019   Hyperlipidemia: Pravastatin 20 mg daily. No new side effects.  Lab Results  Component Value Date   CHOL 159 07/14/2019   HDL 35 (L) 07/14/2019   LDLCALC 95 07/14/2019   TRIG 167 (H) 07/14/2019   CHOLHDL 4.5 07/14/2019   Lab Results  Component Value Date   ALT 20 07/14/2019   AST 13 07/14/2019   ALKPHOS 124 (H) 07/14/2019   BILITOT 0.6 07/14/2019   Back Pain: Discussed in 04/2018 Worse over past 4 months.  No bowel or bladder incontinence, no saddle anesthesia, no lower extremity weakness. Charlie horse/cramps in calves and thighs both sides. Some radiating pain down left leg.  No injury, no new activiity.  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 Mm relaxant in past helped.  Referred to ortho in past - was not seen by ortho for back.  Lumbar spine XR in 12/2017:  IMPRESSION: Osteoarthritic change at several levels. No fracture or spondylolisthesis. There is aortic atherosclerosis.   COPD Treated by Dr. [name] with pulmonology, last visit August 29, 2019.  Stopped Incruse, started Anoro.  Continued esomeprazole 40 mg daily for GERD, recommend restarting Flonase 2 sprays per nostril daily as well as AllerClear daily for secondary causes of cough. Doing well - using above meds, allerclear not daily.  Had both covid vaccines.   History Patient Active Problem List   Diagnosis Date Noted  . S/P left TKA 06/17/2019  . 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 Allergic rhinitis 03/12/2017  . Thrush, oral 02/27/2017  . SUI (stress urinary incontinence), male 03/20/2016  . Prostate cancer ([place]) 02/09/2016  . Obese 09/08/2015  . S/P right TKA 09/06/2015  . S/P knee replacement 09/06/2015  . COPD, mild ([place]) 07/20/2014  . Pulmonary nodules 02/23/2014  . Tobacco use disorder, moderate, in sustained remission 02/23/2014  . Recurrent right inguinal hernia 10/20/2013  . ED (erectile dysfunction) 08/26/2012  . Physical exam, annual 11/05/2011  . HTN (hypertension) 11/05/2011  . GERD (gastroesophageal reflux disease) 11/05/2011  .  Chronic joint pain 11/05/2011   Past Medical History:  Diagnosis Date  . Allergy    seasonal  . Arthritis    oa  . Asthma   . COPD (chronic obstructive pulmonary disease) ([place])   . GERD (gastroesophageal reflux disease)   . Hypertension   . Prostate cancer ([place]) 01/2016   prostatectomy   . Shortness of breath dyspnea    Past Surgical History:  Procedure Laterality Date  . HERNIA REPAIR  last done 3-4 yrs ago   x 2  . INGUINAL HERNIA REPAIR Right 11/20/2013   Procedure: OPEN REPAIR OF RECURRENT RIGHT INGUINAL  HERNIA WITH MESH;  Surgeon: [name]. [name], MD;  Location: [facility];  Service: General;  Laterality: Right;  . INSERTION OF MESH Right 11/20/2013   Procedure: INSERTION OF MESH;  Surgeon: [name]. [name], MD;  Location: [facility];  Service: General;  Laterality: Right;  . KNEE SURGERY Left july 2014   mcl and meniscus repair  . LYMPHADENECTOMY Bilateral 02/09/2016   Procedure: PELVIC LYMPHADENECTOMY;  Surgeon: [name], MD;  Location: [facility];  Service: Urology;  Laterality: Bilateral;  . right knee arthroscopy     . ROBOT ASSISTED LAPAROSCOPIC RADICAL PROSTATECTOMY N/A 02/09/2016   Procedure: XI ROBOTIC ASSISTED LAPAROSCOPIC RADICAL PROSTATECTOMY WITH INDOCYANINE GREEN DYE AND ADHESIOLYSIS;  Surgeon: [name], MD;  Location: [facility];  Service: Urology;  Laterality: N/A;  . TOTAL KNEE ARTHROPLASTY Right 09/06/2015   Procedure: RIGHT TOTAL KNEE ARTHROPLASTY;  Surgeon: [name], MD;  Location: [facility];  Service: Orthopedics;  Laterality: Right;  . TOTAL KNEE ARTHROPLASTY Left 06/17/2019   Procedure: TOTAL KNEE ARTHROPLASTY;  Surgeon: [name], MD;  Location: [facility];  Service: Orthopedics;  Laterality: Left;  70 mins   Allergies  Allergen Reactions  . Percocet [Oxycodone-Acetaminophen] Other (See Comments)    Nausea-Doesn't work  . Amoxicillin Rash    Has patient had a PCN reaction causing immediate rash, facial/tongue/throat swelling, SOB or lightheadedness with hypotension: No Has patient had a PCN reaction causing severe rash involving mucus membranes or skin necrosis: No Has patient had a PCN reaction that required hospitalization No Has patient had a PCN reaction occurring within the last 10 years: Yes- 2016 If all of the above answers are "NO", then may proceed with Cephalosporin use.    Prior to Admission medications   Medication Sig Start Date End Date Taking? Authorizing Provider  amLODipine (NORVASC) 2.5 MG tablet Take 1 tablet (2.5 mg total) by mouth daily. For total  dose 7.5mg  07/14/19  Yes [name], MD  amLODipine (NORVASC) 5 MG tablet Take 1 tablet (5 mg total) by mouth daily. 07/14/19  Yes [name], MD  azelastine (OPTIVAR) 0.05 % ophthalmic solution Place 1 drop into both eyes 2 (two) times daily as needed. For itching/allergy symptoms 07/14/19  Yes [name], MD  celecoxib (CELEBREX) 200 MG capsule Take 1 capsule (200 mg total) by mouth 2 (two) times daily. 06/18/19  Yes Maurice March, PA-C  Cholecalciferol (VITAMIN D3) 50 MCG (2000 UT) TABS Take 2,000 Units by mouth daily.   Yes [provider]  cyclobenzaprine (FLEXERIL) 5 MG tablet take one tablet by mouth three times daily as needed for muscle spasm. start at bedtime due to sedation Patient taking differently: Take 5 mg by mouth 3 (three) times daily as needed for muscle spasms.  04/04/19  Yes [name], MD  docusate sodium (COLACE) 100 MG capsule Take 1 capsule (100 mg  total) by mouth 2 (two) times daily. 06/18/19  Yes Maurice March, PA-C  esomeprazole (NEXIUM) 40 MG capsule Take 1 capsule (40 mg total) by mouth daily. 07/14/19  Yes [name], MD  fluticasone (KLS ALLER-FLO) 50 MCG/ACT nasal spray Place 2 sprays into both nostrils daily. 07/14/19  Yes [name], MD  HYDROcodone-acetaminophen (NORCO) 7.5-325 MG tablet Take 1-2 tablets by mouth every 4 (four) hours as needed for severe pain (pain score 7-10). 06/18/19  Yes Stinson, Nicola Girt, PA-C  INCRUSE ELLIPTA 62.5 MCG/INH AEPB inhale 1 puff into lungs once daily Patient taking differently: Inhale 1 puff into the lungs daily.  01/09/19  Yes Byrum, Rose Fillers, MD  KLS ALLERCLEAR 10 MG tablet TAKE 1 TABLET BY MOUTH DAILY Patient taking differently: Take 10 mg by mouth daily as needed for allergies.  08/15/17  Yes Byrum, Rose Fillers, MD  Menthol-Methyl Salicylate (SALONPAS PAIN RELIEF PATCH EX) Place 1 patch onto the skin daily as needed (pain.).   Yes [provider]  methocarbamol  (ROBAXIN) 500 MG tablet Take 1 tablet (500 mg total) by mouth every 6 (six) hours as needed for muscle spasms. 06/18/19  Yes Maurice March, PA-C  Naphazoline-Pheniramine (OPCON-A) 0.027-0.315 % SOLN Apply 1 drop to eye 2 (two) times daily as needed (dry/irritated eyes).   Yes [provider]  polyethylene glycol (MIRALAX / GLYCOLAX) 17 g packet Take 17 g by mouth 2 (two) times daily. 06/18/19  Yes Maurice March, PA-C  pravastatin (PRAVACHOL) 20 MG tablet Take 1 tablet (20 mg total) by mouth daily. 07/14/19  Yes [name], MD  triamcinolone cream (KENALOG) 0.1 % Apply 1 application topically 2 (two) times daily as needed. To affected areas only as needed. Patient taking differently: Apply 1 application topically 2 (two) times daily as needed (rash).  01/08/19  Yes [name], MD  umeclidinium-vilanterol [facility] ELLIPTA) 62.5-25 MCG/INH AEPB Inhale 1 puff into the lungs daily. 09/08/19  Yes Collene Gobble, MD  ferrous sulfate 325 (65 FE) MG tablet Take 1 tablet (325 mg total) by mouth 2 (two) times daily with a meal for 14 days. 06/18/19 07/02/19  Maurice March, PA-C   Social History   Socioeconomic History  . Marital status: Married    Spouse name: Not on file  . Number of children: Not on file  . Years of education: Not on file  . Highest education level: Not on file  Occupational History  . Not on file  Tobacco Use  . Smoking status: Former Smoker    Packs/day: 1.50    Years: 20.00    Pack years: 30.00    Types: Cigarettes    Quit date: 11/05/1999    Years since quitting: 20.2  . Smokeless tobacco: Never Used  Substance and Sexual Activity  . Alcohol use: No    Comment: last used 25 years ago   . Drug use: Yes    Types: Cocaine    Comment: last used cocaine  and acid 25 yrs ago,none used since  . Sexual activity: Not on file  Other Topics Concern  . Not on file  Social History Narrative  . Not on file   Social Determinants of Health    Financial Resource Strain:   . Difficulty of Paying Living Expenses:   Food Insecurity:   . Worried About Charity fundraiser in the Last Year:   . Arboriculturist in the Last Year:   [employer]  Needs:   . Lack of Transportation (Medical):   [name] Kitchen Lack of Transportation (Non-Medical):   Physical Activity:   . Days of Exercise per Week:   . Minutes of Exercise per Session:   Stress:   . Feeling of Stress :   Social Connections:   . Frequency of Communication with Friends and Family:   . Frequency of Social Gatherings with Friends and Family:   . Attends Religious Services:   . Active Member of Clubs or Organizations:   . Attends Archivist Meetings:   [name] Kitchen Marital Status:   Intimate Partner Violence:   . Fear of Current or Ex-Partner:   . Emotionally Abused:   [name] Kitchen Physically Abused:   . Sexually Abused:     Review of Systems  Constitutional: Negative for fatigue and unexpected weight change.  Eyes: Negative for visual disturbance.  Respiratory: Negative for cough, chest tightness and shortness of breath.   Cardiovascular: Negative for chest pain, palpitations and leg swelling (left after knee surgery. ).  Gastrointestinal: Negative for abdominal pain and blood in stool.  Neurological: Positive for dizziness (occasional. ). Negative for light-headedness and headaches.     Objective:   Vitals:   01/12/20 1057  BP: 139/84  Pulse: 72  Temp: 99 F (37.2 C)  TempSrc: Temporal  SpO2: 94%  Weight: 221 lb (100.2 kg)  Height: 5\' 11"  (1.803 m)     Physical Exam Vitals reviewed.  Constitutional:      Appearance: He is well-developed.  HENT:     Head: Normocephalic and atraumatic.  Eyes:     Pupils: Pupils are equal, round, and reactive to light.  Neck:     Vascular: No carotid bruit or JVD.  Cardiovascular:     Rate and Rhythm: Normal rate and regular rhythm.     Heart sounds: Normal heart sounds. No murmur.  Pulmonary:     Effort: Pulmonary effort is  normal.     Breath sounds: Normal breath sounds. No rales.  Musculoskeletal:     Right lower leg: Edema (trace on r , 1+ left, no calf pain. ) present.     Left lower leg: Edema present.     Comments: Negative seated SLR.  Difficulty obtaining patellar reflex bilaterally, 1+ Achilles bilaterally.  Lumbar spine exam, minimal tenderness at the paraspinals in the lower lumbar spine on the left, no focal midline bony tenderness.  Discomfort in the low back with forward flexion to approximately 90 degrees.  Skin:    General: Skin is warm and dry.  Neurological:     Mental Status: He is alert and oriented to person, place, and time.     DG Lumbar Spine Complete  Result Date: 01/12/2020 CLINICAL DATA:  Chronic back pain, left sciatica EXAM: LUMBAR SPINE - COMPLETE 4+ VIEW COMPARISON:  CT 12/20/2017 FINDINGS: Negative for fracture. Normal alignment. Narrowing of the L2-3 interspace. Endplate spurring [id number]. Aortic Atherosclerosis (ICD10-170.0). IMPRESSION: 1. Negative for fracture or other acute bone abnormality. 2. Degenerative disc disease L2-L5. Electronically Signed   By: Lucrezia Europe M.D.   On: 01/12/2020 12:29      Assessment & Plan:  JOSHOA GARATE is a 69 y.o. male . Essential hypertension - Plan: Lipid panel, Comprehensive metabolic panel, amLODipine (NORVASC) 2.5 MG tablet, amLODipine (NORVASC) 5 MG tablet  - Stable, borderline elevation.  tolerating current regimen. Medications refilled. Labs pending as above.  Recommend decrease sodium intake, reading nutrition labels and avoiding added salt.  Decrease processed food.  Hyperlipidemia, unspecified hyperlipidemia type - Plan: Lipid panel, Comprehensive metabolic panel  -Tolerating statin, continue same.  Check labs  Chronic low back pain with left-sided sciatica, unspecified back pain laterality - Plan: DG Lumbar Spine Complete, Ambulatory referral to Spine Surgery Low back pain - Plan: cyclobenzaprine (FLEXERIL) 5 MG tablet    -Degenerative disc disease lumbar spine, acute on chronic low back pain.  Suspect some of this may be due to relative deconditioning out of work during pandemic and then return to his standing job.  No red flags on exam or imaging.  Flexeril temporarily refilled, potential side effects discussed, stop if any increased dizziness, refer to back specialist.  May benefit from PT versus discussion of epidural steroid injection.  Episode of dizziness - Plan: CBC  -Nonfocal exam, vitals stable.  Denies associated cardiac symptoms.  Increase fluids recommended, improve diet, follow-up if persistent.  ER/RTC precautions.  Allergic rhinitis, unspecified seasonality, unspecified trigger - Plan: fluticasone (KLS ALLER-FLO) 50 MCG/ACT nasal spray  -Improved with Flonase, Allerclear as needed.  Continue same  Gastroesophageal reflux disease, unspecified whether esophagitis present - Plan: esomeprazole (NEXIUM) 40 MG capsule  - Stable, continue Nexium.    Meds ordered this encounter  Medications  . amLODipine (NORVASC) 2.5 MG tablet    Sig: Take 1 tablet (2.5 mg total) by mouth daily. For total dose 7.5mg     Dispense:  90 tablet    Refill:  1  . amLODipine (NORVASC) 5 MG tablet    Sig: Take 1 tablet (5 mg total) by mouth daily.    Dispense:  90 tablet    Refill:  1  . esomeprazole (NEXIUM) 40 MG capsule    Sig: Take 1 capsule (40 mg total) by mouth daily.    Dispense:  90 capsule    Refill:  1  . fluticasone (KLS ALLER-FLO) 50 MCG/ACT nasal spray    Sig: Place 2 sprays into both nostrils daily.    Dispense:  16 g    Refill:  5    Per pharmacy-ins covers only [place] aller-flo  . cyclobenzaprine (FLEXERIL) 5 MG tablet    Sig: take one tablet by mouth three times daily as needed for muscle spasm. start at bedtime due to sedation    Dispense:  15 tablet    Refill:  0   Patient Instructions   Try to cut back on added salt and processed foods. Look at nutrition label to cut back on sodium intake.  That will help blood pressure. Stay on same doses of amlodipine for now. No other med changes for now.   Try to increase fluids during work.  If dizziness is not improving - return for recheck. Return to the clinic or go to the nearest emergency room if any of your symptoms worsen or new symptoms occur.  I will refer you to back specialist. Let me know if you do not hear from them in next 3 weeks. Ok to try flexeril for now. Be careful with this medicine and dizziness/sedation. If that worsens your dizziness - stop taking it. Return to the clinic or go to the nearest emergency room if any of your symptoms worsen or new symptoms occur.   Chronic Back Pain When back pain lasts longer than 3 months, it is called chronic back pain.The cause of your back pain may not be known. Some common causes include:  Wear and tear (degenerative disease) of the bones, ligaments, or disks in your back.  Inflammation and stiffness in your  back (arthritis). People who have chronic back pain often go through certain periods in which the pain is more intense (flare-ups). Many people can learn to manage the pain with home care. Follow these instructions at home: Pay attention to any changes in your symptoms. Take these actions to help with your pain: Activity   Avoid bending and other activities that make the problem worse.  Maintain a proper position when standing or sitting: ? When standing, keep your upper back and neck straight, with your shoulders pulled back. Avoid slouching. ? When sitting, keep your back straight and relax your shoulders. Do not round your shoulders or pull them backward.  Do not sit or stand in one place for long periods of time.  Take brief periods of rest throughout the day. This will reduce your pain. Resting in a lying or standing position is usually better than sitting to rest.  When you are resting for longer periods, mix in some mild activity or stretching between periods of  rest. This will help to prevent stiffness and pain.  Get regular exercise. Ask your health care provider what activities are safe for you.  Do not lift anything that is heavier than 10 lb (4.5 kg). Always use proper lifting technique, which includes: ? Bending your knees. ? Keeping the load close to your body. ? Avoiding twisting.  Sleep on a firm mattress in a comfortable position. Try lying on your side with your knees slightly bent. If you lie on your back, put a pillow under your knees. Managing pain  If directed, apply ice to the painful area. Your health care provider may recommend applying ice during the first 24-48 hours after a flare-up begins. ? Put ice in a plastic bag. ? Place a towel between your skin and the bag. ? Leave the ice on for 20 minutes, 2-3 times per day.  If directed, apply heat to the affected area as often as told by your health care provider. Use the heat source that your health care provider recommends, such as a moist heat pack or a heating pad. ? Place a towel between your skin and the heat source. ? Leave the heat on for 20-30 minutes. ? Remove the heat if your skin turns bright red. This is especially important if you are unable to feel pain, heat, or cold. You may have a greater risk of getting burned.  Try soaking in a warm tub.  Take over-the-counter and prescription medicines only as told by your health care provider.  Keep all follow-up visits as told by your health care provider. This is important. Contact a health care provider if:  You have pain that is not relieved with rest or medicine. Get help right away if:  You have weakness or numbness in one or both of your legs or feet.  You have trouble controlling your bladder or your bowels.  You have nausea or vomiting.  You have pain in your abdomen.  You have shortness of breath or you faint. This information is not intended to replace advice given to you by your health care provider.  Make sure you discuss any questions you have with your health care provider. Document Revised: 12/26/2018 Document Reviewed: 03/14/2017 Elsevier Patient Education  [employer].    If you have lab work done today you will be contacted with your lab results within the next 2 weeks.  If you have not heard from [place] then please contact us. The fastest way to get  your results is to register for My Chart.   IF you received an x-ray today, you will receive an invoice from [facility]. Please contact [facility] at [phone] with questions or concerns regarding your invoice.   IF you received labwork today, you will receive an invoice from [facility]. Please contact [facility] at [phone] with questions or concerns regarding your invoice.   Our billing staff will not be able to assist you with questions regarding bills from these companies.  You will be contacted with the lab results as soon as they are available. The fastest way to get your results is to activate your My Chart account. Instructions are located on the last page of this paperwork. If you have not heard from [place] regarding the results in 2 weeks, please contact this office.         Signed, [name], MD [facility]

## 2020-01-12 NOTE — Patient Instructions (Addendum)
Try to cut back on added salt and processed foods. Look at nutrition label to cut back on sodium intake. That will help blood pressure. Stay on same doses of amlodipine for now. No other med changes for now.   Try to increase fluids during work.  If dizziness is not improving - return for recheck. Return to the clinic or go to the nearest emergency room if any of your symptoms worsen or new symptoms occur.  I will refer you to back specialist. Let me know if you do not hear from them in next 3 weeks. Ok to try flexeril for now. Be careful with this medicine and dizziness/sedation. If that worsens your dizziness - stop taking it. Return to the clinic or go to the nearest emergency room if any of your symptoms worsen or new symptoms occur.   Chronic Back Pain When back pain lasts longer than 3 months, it is called chronic back pain.The cause of your back pain may not be known. Some common causes include:  Wear and tear (degenerative disease) of the bones, ligaments, or disks in your back.  Inflammation and stiffness in your back (arthritis). People who have chronic back pain often go through certain periods in which the pain is more intense (flare-ups). Many people can learn to manage the pain with home care. Follow these instructions at home: Pay attention to any changes in your symptoms. Take these actions to help with your pain: Activity   Avoid bending and other activities that make the problem worse.  Maintain a proper position when standing or sitting: ? When standing, keep your upper back and neck straight, with your shoulders pulled back. Avoid slouching. ? When sitting, keep your back straight and relax your shoulders. Do not round your shoulders or pull them backward.  Do not sit or stand in one place for long periods of time.  Take brief periods of rest throughout the day. This will reduce your pain. Resting in a lying or standing position is usually better than sitting to  rest.  When you are resting for longer periods, mix in some mild activity or stretching between periods of rest. This will help to prevent stiffness and pain.  Get regular exercise. Ask your health care provider what activities are safe for you.  Do not lift anything that is heavier than 10 lb (4.5 kg). Always use proper lifting technique, which includes: ? Bending your knees. ? Keeping the load close to your body. ? Avoiding twisting.  Sleep on a firm mattress in a comfortable position. Try lying on your side with your knees slightly bent. If you lie on your back, put a pillow under your knees. Managing pain  If directed, apply ice to the painful area. Your health care provider may recommend applying ice during the first 24-48 hours after a flare-up begins. ? Put ice in a plastic bag. ? Place a towel between your skin and the bag. ? Leave the ice on for 20 minutes, 2-3 times per day.  If directed, apply heat to the affected area as often as told by your health care provider. Use the heat source that your health care provider recommends, such as a moist heat pack or a heating pad. ? Place a towel between your skin and the heat source. ? Leave the heat on for 20-30 minutes. ? Remove the heat if your skin turns bright red. This is especially important if you are unable to feel pain, heat, or cold. You may  have a greater risk of getting burned.  Try soaking in a warm tub.  Take over-the-counter and prescription medicines only as told by your health care provider.  Keep all follow-up visits as told by your health care provider. This is important. Contact a health care provider if:  You have pain that is not relieved with rest or medicine. Get help right away if:  You have weakness or numbness in one or both of your legs or feet.  You have trouble controlling your bladder or your bowels.  You have nausea or vomiting.  You have pain in your abdomen.  You have shortness of breath  or you faint. This information is not intended to replace advice given to you by your health care provider. Make sure you discuss any questions you have with your health care provider. Document Revised: 12/26/2018 Document Reviewed: 03/14/2017 Elsevier Patient Education  [employer].    If you have lab work done today you will be contacted with your lab results within the next 2 weeks.  If you have not heard from [place] then please contact us. The fastest way to get your results is to register for My Chart.   IF you received an x-ray today, you will receive an invoice from [facility]. Please contact [facility] at [phone] with questions or concerns regarding your invoice.   IF you received labwork today, you will receive an invoice from [place]. Please contact [facility] at [phone] with questions or concerns regarding your invoice.   Our billing staff will not be able to assist you with questions regarding bills from these companies.  You will be contacted with the lab results as soon as they are available. The fastest way to get your results is to activate your My Chart account. Instructions are located on the last page of this paperwork. If you have not heard from [place] regarding the results in 2 weeks, please contact this office.

## 2020-01-13 ENCOUNTER — Ambulatory Visit: Payer: Managed Care, Other (non HMO) | Admitting: Family Medicine

## 2020-01-20 ENCOUNTER — Encounter: Payer: Self-pay | Admitting: Radiology

## 2020-02-02 ENCOUNTER — Encounter: Payer: Self-pay | Admitting: Dermatology

## 2020-02-02 ENCOUNTER — Ambulatory Visit: Payer: 59 | Admitting: Dermatology

## 2020-02-02 ENCOUNTER — Other Ambulatory Visit: Payer: Self-pay

## 2020-02-02 DIAGNOSIS — L918 Other hypertrophic disorders of the skin: Secondary | ICD-10-CM | POA: Diagnosis not present

## 2020-02-02 DIAGNOSIS — L439 Lichen planus, unspecified: Secondary | ICD-10-CM | POA: Diagnosis not present

## 2020-02-07 ENCOUNTER — Encounter: Payer: Self-pay | Admitting: Dermatology

## 2020-02-07 NOTE — Progress Notes (Addendum)
   Follow-Up Visit   Subjective  [name] is a 69 y.o. male who presents for the following: Skin Tag (Patient here today for skin tag removal around his neck. ).  Lichen planus Location: Lower legs Duration: 2 years Quality: Proved Associated Signs/Symptoms: Modifying Factors:  Severity:  Timing: Context: Would also like skin tags removed from left neck  The following portions of the chart were reviewed this encounter and updated as appropriate: Tobacco  Allergies  Meds  Problems  Med Hx  Surg Hx  Fam Hx      Objective  Well appearing patient in no apparent distress; mood and affect are within normal limits.  A focused examination was performed including Head, neck, arms, legs.. Relevant physical exam findings are noted in the Assessment and Plan.   Assessment & Plan  Lichen planus Left Ankle - Anterior  Continue topical therapy on a as needed basis.  Skin tag Neck - Anterior  Scissor excision.

## 2020-02-18 ENCOUNTER — Other Ambulatory Visit: Payer: Self-pay | Admitting: Emergency Medicine

## 2020-02-24 ENCOUNTER — Other Ambulatory Visit: Payer: Self-pay | Admitting: Emergency Medicine

## 2020-04-08 ENCOUNTER — Other Ambulatory Visit: Payer: Self-pay | Admitting: Emergency Medicine

## 2020-05-19 ENCOUNTER — Other Ambulatory Visit: Payer: Self-pay | Admitting: Family Medicine

## 2020-05-19 DIAGNOSIS — E785 Hyperlipidemia, unspecified: Secondary | ICD-10-CM

## 2020-07-12 ENCOUNTER — Ambulatory Visit: Payer: 59 | Admitting: Family Medicine

## 2020-07-12 ENCOUNTER — Encounter: Payer: Self-pay | Admitting: Family Medicine

## 2020-07-12 ENCOUNTER — Other Ambulatory Visit: Payer: Self-pay

## 2020-07-12 VITALS — BP 140/86 | HR 88 | Temp 98.0°F | Ht 71.0 in | Wt 225.0 lb

## 2020-07-12 DIAGNOSIS — M79675 Pain in left toe(s): Secondary | ICD-10-CM

## 2020-07-12 DIAGNOSIS — I498 Other specified cardiac arrhythmias: Secondary | ICD-10-CM

## 2020-07-12 DIAGNOSIS — R42 Dizziness and giddiness: Secondary | ICD-10-CM | POA: Diagnosis not present

## 2020-07-12 DIAGNOSIS — M2011 Hallux valgus (acquired), right foot: Secondary | ICD-10-CM

## 2020-07-12 DIAGNOSIS — I10 Essential (primary) hypertension: Secondary | ICD-10-CM | POA: Diagnosis not present

## 2020-07-12 DIAGNOSIS — E785 Hyperlipidemia, unspecified: Secondary | ICD-10-CM

## 2020-07-12 DIAGNOSIS — M2012 Hallux valgus (acquired), left foot: Secondary | ICD-10-CM

## 2020-07-12 DIAGNOSIS — Z23 Encounter for immunization: Secondary | ICD-10-CM | POA: Diagnosis not present

## 2020-07-12 NOTE — Patient Instructions (Addendum)
Try to cut back on sodium/salt in the diet to help with blood pressure.  No change in meds for now.  I will check labs on heart flutter feeling. If those occur more often, or are associated with any symptoms such as lightheadedness, dizziness, shortness of breath, or chest pains, will need to be seen by cardiology. Let me know.  Return to the clinic or go to the nearest emergency room if any of your symptoms worsen or new symptoms occur.   I will refer you to podiatry. If any worsening toe symptoms prior to that visit - return here, urgent care or ER if needed.   shingrix vaccine #2 appears to be due - can be given at pharmacy.    Managing Your Hypertension Hypertension is commonly called high blood pressure. This is when the force of your blood pressing against the walls of your arteries is too strong. Arteries are blood vessels that carry blood from your heart throughout your body. Hypertension forces the heart to work harder to pump blood, and may cause the arteries to become narrow or stiff. Having untreated or uncontrolled hypertension can cause heart attack, stroke, kidney disease, and other problems. What are blood pressure readings? A blood pressure reading consists of a higher number over a lower number. Ideally, your blood pressure should be below 120/80. The first ("top") number is called the systolic pressure. It is a measure of the pressure in your arteries as your heart beats. The second ("bottom") number is called the diastolic pressure. It is a measure of the pressure in your arteries as the heart relaxes. What does my blood pressure reading mean? Blood pressure is classified into four stages. Based on your blood pressure reading, your health care provider may use the following stages to determine what type of treatment you need, if any. Systolic pressure and diastolic pressure are measured in a unit called mm Hg. Normal  Systolic pressure: below 128.  Diastolic pressure: below  80. Elevated  Systolic pressure: 786-767.  Diastolic pressure: below 80. Hypertension stage 1  Systolic pressure: 209-470.  Diastolic pressure: 96-28. Hypertension stage 2  Systolic pressure: 366 or above.  Diastolic pressure: 90 or above. What health risks are associated with hypertension? Managing your hypertension is an important responsibility. Uncontrolled hypertension can lead to:  A heart attack.  A stroke.  A weakened blood vessel (aneurysm).  Heart failure.  Kidney damage.  Eye damage.  Metabolic syndrome.  Memory and concentration problems. What changes can I make to manage my hypertension? Hypertension can be managed by making lifestyle changes and possibly by taking medicines. Your health care provider will help you make a plan to bring your blood pressure within a normal range. Eating and drinking   Eat a diet that is high in fiber and potassium, and low in salt (sodium), added sugar, and fat. An example eating plan is called the DASH (Dietary Approaches to Stop Hypertension) diet. To eat this way: ? Eat plenty of fresh fruits and vegetables. Try to fill half of your plate at each meal with fruits and vegetables. ? Eat whole grains, such as whole wheat pasta, brown rice, or whole grain bread. Fill about one quarter of your plate with whole grains. ? Eat low-fat diary products. ? Avoid fatty cuts of meat, processed or cured meats, and poultry with skin. Fill about one quarter of your plate with lean proteins such as fish, chicken without skin, beans, eggs, and tofu. ? Avoid premade and processed foods. These  tend to be higher in sodium, added sugar, and fat.  Reduce your daily sodium intake. Most people with hypertension should eat less than 1,500 mg of sodium a day.  Limit alcohol intake to no more than 1 drink a day for nonpregnant women and 2 drinks a day for men. One drink equals 12 oz of beer, 5 oz of wine, or 1 oz of hard liquor. Lifestyle  Work  with your health care provider to maintain a healthy body weight, or to lose weight. Ask what an ideal weight is for you.  Get at least 30 minutes of exercise that causes your heart to beat faster (aerobic exercise) most days of the week. Activities may include walking, swimming, or biking.  Include exercise to strengthen your muscles (resistance exercise), such as weight lifting, as part of your weekly exercise routine. Try to do these types of exercises for 30 minutes at least 3 days a week.  Do not use any products that contain nicotine or tobacco, such as cigarettes and e-cigarettes. If you need help quitting, ask your health care provider.  Control any long-term (chronic) conditions you have, such as high cholesterol or diabetes. Monitoring  Monitor your blood pressure at home as told by your health care provider. Your personal target blood pressure may vary depending on your medical conditions, your age, and other factors.  Have your blood pressure checked regularly, as often as told by your health care provider. Working with your health care provider  Review all the medicines you take with your health care provider because there may be side effects or interactions.  Talk with your health care provider about your diet, exercise habits, and other lifestyle factors that may be contributing to hypertension.  Visit your health care provider regularly. Your health care provider can help you create and adjust your plan for managing hypertension. Will I need medicine to control my blood pressure? Your health care provider may prescribe medicine if lifestyle changes are not enough to get your blood pressure under control, and if:  Your systolic blood pressure is 130 or higher.  Your diastolic blood pressure is 80 or higher. Take medicines only as told by your health care provider. Follow the directions carefully. Blood pressure medicines must be taken as prescribed. The medicine does not  work as well when you skip doses. Skipping doses also puts you at risk for problems. Contact a health care provider if:  You think you are having a reaction to medicines you have taken.  You have repeated (recurrent) headaches.  You feel dizzy.  You have swelling in your ankles.  You have trouble with your vision. Get help right away if:  You develop a severe headache or confusion.  You have unusual weakness or numbness, or you feel faint.  You have severe pain in your chest or abdomen.  You vomit repeatedly.  You have trouble breathing. Summary  Hypertension is when the force of blood pumping through your arteries is too strong. If this condition is not controlled, it may put you at risk for serious complications.  Your personal target blood pressure may vary depending on your medical conditions, your age, and other factors. For most people, a normal blood pressure is less than 120/80.  Hypertension is managed by lifestyle changes, medicines, or both. Lifestyle changes include weight loss, eating a healthy, low-sodium diet, exercising more, and limiting alcohol. This information is not intended to replace advice given to you by your health care provider. Make sure  you discuss any questions you have with your health care provider. Document Revised: 12/27/2018 Document Reviewed: 08/02/2016 Elsevier Patient Education  [employer].   Palpitations Palpitations are feelings that your heartbeat is irregular or is faster than normal. It may feel like your heart is fluttering or skipping a beat. Palpitations are usually not a serious problem. They may be caused by many things, including smoking, caffeine, alcohol, stress, and certain medicines or drugs. Most causes of palpitations are not serious. However, some palpitations can be a sign of a serious problem. You may need further tests to rule out serious medical problems. Follow these instructions at home:     Pay attention  to any changes in your condition. Take these actions to help manage your symptoms: Eating and drinking  Avoid foods and drinks that may cause palpitations. These may include: ? Caffeinated coffee, tea, soft drinks, diet pills, and energy drinks. ? Chocolate. ? Alcohol. Lifestyle  Take steps to reduce your stress and anxiety. Things that can help you relax include: ? Yoga. ? Mind-body activities, such as deep breathing, meditation, or using words and images to create positive thoughts (guided imagery). ? Physical activity, such as swimming, jogging, or walking. Tell your health care provider if your palpitations increase with activity. If you have chest pain or shortness of breath with activity, do not continue the activity until you are seen by your health care provider. ? Biofeedback. This is a method that helps you learn to use your mind to control things in your body, such as your heartbeat.  Do not use drugs, including cocaine or ecstasy. Do not use marijuana.  Get plenty of rest and sleep. Keep a regular bed time. General instructions  Take over-the-counter and prescription medicines only as told by your health care provider.  Do not use any products that contain nicotine or tobacco, such as cigarettes and e-cigarettes. If you need help quitting, ask your health care provider.  Keep all follow-up visits as told by your health care provider. This is important. These may include visits for further testing if palpitations do not go away or get worse. Contact a health care provider if you:  Continue to have a fast or irregular heartbeat after 24 hours.  Notice that your palpitations occur more often. Get help right away if you:  Have chest pain or shortness of breath.  Have a severe headache.  Feel dizzy or you faint. Summary  Palpitations are feelings that your heartbeat is irregular or is faster than normal. It may feel like your heart is fluttering or skipping a  beat.  Palpitations may be caused by many things, including smoking, caffeine, alcohol, stress, certain medicines, and drugs.  Although most causes of palpitations are not serious, some causes can be a sign of a serious medical problem.  Get help right away if you faint or have chest pain, shortness of breath, a severe headache, or dizziness. This information is not intended to replace advice given to you by your health care provider. Make sure you discuss any questions you have with your health care provider. Document Revised: 10/17/2017 Document Reviewed: 10/17/2017 Elsevier Patient Education  2020 [employer].   Managing Your Hypertension Hypertension is commonly called high blood pressure. This is when the force of your blood pressing against the walls of your arteries is too strong. Arteries are blood vessels that carry blood from your heart throughout your body. Hypertension forces the heart to work harder to pump blood, and may  cause the arteries to become narrow or stiff. Having untreated or uncontrolled hypertension can cause heart attack, stroke, kidney disease, and other problems. What are blood pressure readings? A blood pressure reading consists of a higher number over a lower number. Ideally, your blood pressure should be below 120/80. The first ("top") number is called the systolic pressure. It is a measure of the pressure in your arteries as your heart beats. The second ("bottom") number is called the diastolic pressure. It is a measure of the pressure in your arteries as the heart relaxes. What does my blood pressure reading mean? Blood pressure is classified into four stages. Based on your blood pressure reading, your health care provider may use the following stages to determine what type of treatment you need, if any. Systolic pressure and diastolic pressure are measured in a unit called mm Hg. Normal  Systolic pressure: below 638.  Diastolic pressure: below  80. Elevated  Systolic pressure: 453-646.  Diastolic pressure: below 80. Hypertension stage 1  Systolic pressure: 803-212.  Diastolic pressure: 24-82. Hypertension stage 2  Systolic pressure: 500 or above.  Diastolic pressure: 90 or above. What health risks are associated with hypertension? Managing your hypertension is an important responsibility. Uncontrolled hypertension can lead to:  A heart attack.  A stroke.  A weakened blood vessel (aneurysm).  Heart failure.  Kidney damage.  Eye damage.  Metabolic syndrome.  Memory and concentration problems. What changes can I make to manage my hypertension? Hypertension can be managed by making lifestyle changes and possibly by taking medicines. Your health care provider will help you make a plan to bring your blood pressure within a normal range. Eating and drinking   Eat a diet that is high in fiber and potassium, and low in salt (sodium), added sugar, and fat. An example eating plan is called the DASH (Dietary Approaches to Stop Hypertension) diet. To eat this way: ? Eat plenty of fresh fruits and vegetables. Try to fill half of your plate at each meal with fruits and vegetables. ? Eat whole grains, such as whole wheat pasta, brown rice, or whole grain bread. Fill about one quarter of your plate with whole grains. ? Eat low-fat diary products. ? Avoid fatty cuts of meat, processed or cured meats, and poultry with skin. Fill about one quarter of your plate with lean proteins such as fish, chicken without skin, beans, eggs, and tofu. ? Avoid premade and processed foods. These tend to be higher in sodium, added sugar, and fat.  Reduce your daily sodium intake. Most people with hypertension should eat less than 1,500 mg of sodium a day.  Limit alcohol intake to no more than 1 drink a day for nonpregnant women and 2 drinks a day for men. One drink equals 12 oz of beer, 5 oz of wine, or 1 oz of hard liquor. Lifestyle  Work  with your health care provider to maintain a healthy body weight, or to lose weight. Ask what an ideal weight is for you.  Get at least 30 minutes of exercise that causes your heart to beat faster (aerobic exercise) most days of the week. Activities may include walking, swimming, or biking.  Include exercise to strengthen your muscles (resistance exercise), such as weight lifting, as part of your weekly exercise routine. Try to do these types of exercises for 30 minutes at least 3 days a week.  Do not use any products that contain nicotine or tobacco, such as cigarettes and e-cigarettes. If you need  help quitting, ask your health care provider.  Control any long-term (chronic) conditions you have, such as high cholesterol or diabetes. Monitoring  Monitor your blood pressure at home as told by your health care provider. Your personal target blood pressure may vary depending on your medical conditions, your age, and other factors.  Have your blood pressure checked regularly, as often as told by your health care provider. Working with your health care provider  Review all the medicines you take with your health care provider because there may be side effects or interactions.  Talk with your health care provider about your diet, exercise habits, and other lifestyle factors that may be contributing to hypertension.  Visit your health care provider regularly. Your health care provider can help you create and adjust your plan for managing hypertension. Will I need medicine to control my blood pressure? Your health care provider may prescribe medicine if lifestyle changes are not enough to get your blood pressure under control, and if:  Your systolic blood pressure is 130 or higher.  Your diastolic blood pressure is 80 or higher. Take medicines only as told by your health care provider. Follow the directions carefully. Blood pressure medicines must be taken as prescribed. The medicine does not  work as well when you skip doses. Skipping doses also puts you at risk for problems. Contact a health care provider if:  You think you are having a reaction to medicines you have taken.  You have repeated (recurrent) headaches.  You feel dizzy.  You have swelling in your ankles.  You have trouble with your vision. Get help right away if:  You develop a severe headache or confusion.  You have unusual weakness or numbness, or you feel faint.  You have severe pain in your chest or abdomen.  You vomit repeatedly.  You have trouble breathing. Summary  Hypertension is when the force of blood pumping through your arteries is too strong. If this condition is not controlled, it may put you at risk for serious complications.  Your personal target blood pressure may vary depending on your medical conditions, your age, and other factors. For most people, a normal blood pressure is less than 120/80.  Hypertension is managed by lifestyle changes, medicines, or both. Lifestyle changes include weight loss, eating a healthy, low-sodium diet, exercising more, and limiting alcohol. This information is not intended to replace advice given to you by your health care provider. Make sure you discuss any questions you have with your health care provider. Document Revised: 12/27/2018 Document Reviewed: 08/02/2016 Elsevier Patient Education  [employer].   If you have lab work done today you will be contacted with your lab results within the next 2 weeks.  If you have not heard from [place] then please contact us. The fastest way to get your results is to register for My Chart.   IF you received an x-ray today, you will receive an invoice from [facility]. Please contact [facility] at [phone] with questions or concerns regarding your invoice.   IF you received labwork today, you will receive an invoice from [place]. Please contact [facility] at [phone] with questions or  concerns regarding your invoice.   Our billing staff will not be able to assist you with questions regarding bills from these companies.  You will be contacted with the lab results as soon as they are available. The fastest way to get your results is to activate your My Chart account. Instructions are located on the  last page of this paperwork. If you have not heard from [place] regarding the results in 2 weeks, please contact this office.

## 2020-07-12 NOTE — Progress Notes (Signed)
Subjective:  Patient ID: [name], male    DOB: 16-Sep-1951  Age: 69 y.o. MRN: [id number]  CC:  Chief Complaint  Patient presents with  . Follow-up    on medication and dizzieness. Pt reports his medication seems to be working with no side effrects. PT reports no issues with dizzieness since last OV.  [name] Kitchen Referral    Pt would like a referral to a pedeiatrist. PT reports pain and numbess in his L foor great toe.    HPI [name] presents for   Hypertension: Borderline control in April.  Salt avoidance discussed.  Decreased processed food.  Continued on amlodipine 7.5 mg total per day.  Rare dizziness previously, has improved.  Home readings: none.  No new side effects of meds. No change in sodium intake.  Rare heart flutter every few weeks. Last few seconds only. No lightheadedness/dizziness. No chest pain.  1 cup coffee during week, 3 on weekends.   BP Readings from Last 3 Encounters:  07/12/20 140/86  01/12/20 139/84  10/08/19 (!) 158/101   Lab Results  Component Value Date   CREATININE 0.86 01/12/2020   Hyperlipidemia: Pravastatin 20 mg daily. Lab Results  Component Value Date   CHOL 145 01/12/2020   HDL 41 01/12/2020   LDLCALC 86 01/12/2020   TRIG 96 01/12/2020   CHOLHDL 3.5 01/12/2020   Lab Results  Component Value Date   ALT 24 01/12/2020   AST 17 01/12/2020   ALKPHOS 115 01/12/2020   BILITOT 0.5 01/12/2020   Left great toe pain/numbness Past 4-5 days sore and numb at times. End of toe inside part of nail. No known injury, no redness or discharge. No prior similar sx's.  Better this morning. 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  Has bunion. Has not seen podiatrist - requests referral.  No hx of gout known.   Immunization History  Administered Date(s) Administered  . Fluad Quad(high Dose 65+) 05/23/2019  . Influenza Split 06/18/2013  . Influenza,inj,Quad PF,6+ Mos 08/03/2015  . Influenza,inj,quad, With Preservative  05/23/2019  . Influenza-Unspecified 07/13/2014, 07/02/2016  . PFIZER SARS-COV-2 Vaccination 11/13/2019, 12/09/2019  . Pneumococcal Conjugate-13 06/10/2018  . Pneumococcal Polysaccharide-23 07/14/2019  . Tdap 10/08/2019  . Zoster Recombinat (Shingrix) 07/30/2018  covid booster few weeks ago.   History Patient Active Problem List   Diagnosis Date Noted  . S/P left TKA 06/17/2019  . 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 Allergic rhinitis 03/12/2017  . Thrush, oral 02/27/2017  . SUI (stress urinary incontinence), male 03/20/2016  . Prostate cancer ([place]) 02/09/2016  . Obese 09/08/2015  . S/P right TKA 09/06/2015  . S/P knee replacement 09/06/2015  . 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 Pulmonary nodules 02/23/2014  . Tobacco use disorder, moderate, in sustained remission 02/23/2014  . Recurrent right inguinal hernia 10/20/2013  . ED (erectile dysfunction) 08/26/2012  . Physical exam, annual 11/05/2011  . HTN (hypertension) 11/05/2011  . GERD (gastroesophageal reflux disease) 11/05/2011  . Chronic joint pain 11/05/2011   Past Medical History:  Diagnosis Date  . Allergy    seasonal  . Arthritis    oa  . Asthma   . COPD (chronic obstructive pulmonary disease) ([place])   . GERD (gastroesophageal reflux disease)   . Hypertension   . Prostate cancer ([place]) 01/2016   prostatectomy   . Shortness of breath dyspnea    Past Surgical History:  Procedure Laterality Date  . HERNIA REPAIR  last done  3-4 yrs ago   x 2  . INGUINAL HERNIA REPAIR Right 11/20/2013   Procedure: OPEN REPAIR OF RECURRENT RIGHT INGUINAL HERNIA WITH MESH;  Surgeon: [name]. [name], MD;  Location: [facility];  Service: General;  Laterality: Right;  . INSERTION OF MESH Right 11/20/2013   Procedure: INSERTION OF MESH;  Surgeon: [name]. [name], MD;  Location: [facility];  Service: General;  Laterality: Right;  . KNEE SURGERY Left july 2014   mcl and meniscus repair  . LYMPHADENECTOMY Bilateral 02/09/2016    Procedure: PELVIC LYMPHADENECTOMY;  Surgeon: [name], MD;  Location: [facility];  Service: Urology;  Laterality: Bilateral;  . right knee arthroscopy     . ROBOT ASSISTED LAPAROSCOPIC RADICAL PROSTATECTOMY N/A 02/09/2016   Procedure: XI ROBOTIC ASSISTED LAPAROSCOPIC RADICAL PROSTATECTOMY WITH INDOCYANINE GREEN DYE AND ADHESIOLYSIS;  Surgeon: [name], MD;  Location: [facility];  Service: Urology;  Laterality: N/A;  . TOTAL KNEE ARTHROPLASTY Right 09/06/2015   Procedure: RIGHT TOTAL KNEE ARTHROPLASTY;  Surgeon: [name], MD;  Location: [facility];  Service: Orthopedics;  Laterality: Right;  . TOTAL KNEE ARTHROPLASTY Left 06/17/2019   Procedure: TOTAL KNEE ARTHROPLASTY;  Surgeon: [name], MD;  Location: [facility];  Service: Orthopedics;  Laterality: Left;  70 mins   Allergies  Allergen Reactions  . Percocet [Oxycodone-Acetaminophen] Other (See Comments)    Nausea-Doesn't work  . Amoxicillin Rash    Has patient had a PCN reaction causing immediate rash, facial/tongue/throat swelling, SOB or lightheadedness with hypotension: No Has patient had a PCN reaction causing severe rash involving mucus membranes or skin necrosis: No Has patient had a PCN reaction that required hospitalization No Has patient had a PCN reaction occurring within the last 10 years: Yes- 2016 If all of the above answers are "NO", then may proceed with Cephalosporin use.    Prior to Admission medications   Medication Sig Start Date End Date Taking? Authorizing Provider  albuterol (PROVENTIL HFA) 108 (90 Base) MCG/ACT inhaler Proventil HFA 90 mcg/actuation aerosol inhaler   2 pufs by inhalation route. 03/12/17  Yes [provider]  amLODipine (NORVASC) 2.5 MG tablet Take 1 tablet (2.5 mg total) by mouth daily. For total dose 7.5mg  01/12/20  Yes [name], MD  ANORO ELLIPTA 62.5-25 MCG/INH AEPB INHALE ONE PUFF BY MOUTH INTO THE LUNGS ONE TIME DAILY 04/08/20  Yes Collene Gobble, MD  azelastine (OPTIVAR)  0.05 % ophthalmic solution Place 1 drop into both eyes 2 (two) times daily as needed. For itching/allergy symptoms 07/14/19  Yes [name], MD  celecoxib (CELEBREX) 200 MG capsule Take 1 capsule (200 mg total) by mouth 2 (two) times daily. 06/18/19  Yes Maurice March, PA-C  Cholecalciferol (VITAMIN D3) 50 MCG (2000 UT) TABS Take 2,000 Units by mouth daily.   Yes [provider]  cyclobenzaprine (FLEXERIL) 5 MG tablet take one tablet by mouth three times daily as needed for muscle spasm. start at bedtime due to sedation 01/12/20  Yes [name], MD  fluticasone (KLS ALLER-FLO) 50 MCG/ACT nasal spray Place 2 sprays into both nostrils daily. 01/12/20  Yes [name], MD  INCRUSE ELLIPTA 62.5 MCG/INH AEPB INHALE ONE PUFF BY MOUTH INTO THE LUNGS ONCE DAILY 02/24/20  Yes Collene Gobble, MD  KLS ESOMEPRAZOLE MAGNESIUM 20 MG capsule  01/29/20  Yes [provider]  Menthol-Methyl Salicylate (SALONPAS PAIN RELIEF PATCH EX) Place 1 patch onto the skin daily as needed (pain.).   Yes [provider]  methocarbamol (ROBAXIN) 500 MG tablet Take 1 tablet (500 mg total) by mouth every 6 (six) hours as needed for muscle spasms. 06/18/19  Yes Griffith Citron R, PA-C  pravastatin (PRAVACHOL) 20 MG tablet TAKE ONE TABLET BY MOUTH ONE TIME DAILY 05/19/20  Yes [name], MD  triamcinolone cream (KENALOG) 0.1 % Apply 1 application topically 2 (two) times daily as needed. To affected areas only as needed. Patient taking differently: Apply 1 application topically 2 (two) times daily as needed (rash).  01/08/19  Yes [name], MD   Social History   Socioeconomic History  . Marital status: Married    Spouse name: Not on file  . Number of children: Not on file  . Years of education: Not on file  . Highest education level: Not on file  Occupational History  . Not on file  Tobacco Use  . Smoking status: Former Smoker    Packs/day: 1.50    Years: 20.00    Pack  years: 30.00    Types: Cigarettes    Quit date: 11/05/1999    Years since quitting: 20.6  . Smokeless tobacco: Never Used  Vaping Use  . Vaping Use: Never used  Substance and Sexual Activity  . Alcohol use: No    Comment: last used 25 years ago   . Drug use: Yes    Types: Cocaine    Comment: last used cocaine  and acid 25 yrs ago,none used since  . Sexual activity: Not on file  Other Topics Concern  . Not on file  Social History Narrative  . Not on file   Social Determinants of Health   Financial Resource Strain:   . Difficulty of Paying Living Expenses: Not on file  Food Insecurity:   . Worried About Charity fundraiser in the Last Year: Not on file  . Ran Out of Food in the Last Year: Not on file  Transportation Needs:   . Lack of Transportation (Medical): Not on file  . Lack of Transportation (Non-Medical): Not on file  Physical Activity:   . Days of Exercise per Week: Not on file  . Minutes of Exercise per Session: Not on file  Stress:   . Feeling of Stress : Not on file  Social Connections:   . Frequency of Communication with Friends and Family: Not on file  . Frequency of Social Gatherings with Friends and Family: Not on file  . Attends Religious Services: Not on file  . Active Member of Clubs or Organizations: Not on file  . Attends Archivist Meetings: Not on file  . Marital Status: Not on file  Intimate Partner Violence:   . Fear of Current or Ex-Partner: Not on file  . Emotionally Abused: Not on file  . Physically Abused: Not on file  . Sexually Abused: Not on file    Review of Systems  Constitutional: Negative for fatigue and unexpected weight change.  Eyes: Negative for visual disturbance.  Respiratory: Negative for cough, chest tightness and shortness of breath.   Cardiovascular: Positive for palpitations (fleeting, 1-2 seconds, rarely as above. ). Negative for chest pain and leg swelling.  Gastrointestinal: Negative for abdominal pain and  blood in stool.  Musculoskeletal: Positive for arthralgias (l toe. ).  Neurological: Positive for light-headedness (rare - per HPI. ). Negative for dizziness and headaches.     Objective:   Vitals:   07/12/20 1055 07/12/20 1058  BP: (!) 152/90 140/86  Pulse: 88   Temp:  98 F (36.7 C)   TempSrc: Temporal   SpO2: 95%   Weight: 225 lb (102.1 kg)   Height: 5\' 11"  (1.803 m)      Physical Exam Vitals reviewed.  Constitutional:      Appearance: He is well-developed.  HENT:     Head: Normocephalic and atraumatic.  Eyes:     Pupils: Pupils are equal, round, and reactive to light.  Neck:     Vascular: No carotid bruit or JVD.  Cardiovascular:     Rate and Rhythm: Normal rate and regular rhythm.     Heart sounds: Normal heart sounds. No murmur heard.   Pulmonary:     Effort: Pulmonary effort is normal.     Breath sounds: Normal breath sounds. No rales.  Musculoskeletal:     Left lower leg: Edema (L>R pedal edema, prior knee issue. NVI distally. ) present.     Comments: Hallux valgus deformity R>L. L great toe min ttp at lateral nail fold, no erythema/discharge/fluctuance.  MTP nontender.   Skin:    General: Skin is warm and dry.  Neurological:     General: No focal deficit present.     Mental Status: He is alert and oriented to person, place, and time.    EKG:  Compared to 06/02/19 Sinus rhythm, first-degree AV block with PR 220.  Left axis.  No apparent acute findings or significant changes from previous EKG in September 2020.  36 minutes spent during visit, greater than 50% counseling and assimilation of information, chart review, and discussion of plan.    Assessment & Plan:  DEVRIN MONFORTE is a 69 y.o. male . Essential hypertension - Plan: EKG 12-Lead, TSH Periodic heart flutter - Plan: EKG 12-Lead, TSH  -Stable, continue same regimen.  No concerning findings on EKG.  RTC/ER precautions given if more frequent heart fluttering or symptomatic during those times.   Check labs above.  Handout given on the salty 6 and recommended decreased sodium intake.  Need for vaccination - Plan: Flu Vaccine QUAD 36+ mos IM  Hyperlipidemia, unspecified hyperlipidemia type - Plan: Comprehensive metabolic panel, Lipid panel  -Tolerating current regimen, check labs.    Episode of dizziness - Plan: TSH, CBC, Comprehensive metabolic panel  -Rare, fleeting, improving.  Check TSH, CBC, labs above, RTC precautions if more persistent or worsening  Pain of left great toe - Plan: Ambulatory referral to Podiatry Valgus deformity of both great toes - Plan: Ambulatory referral to Podiatry  -Bilateral hallux valgus, recent symptoms may be small ingrown toenail without signs of infection.  Refer to podiatry for further evaluation.  RTC precautions  No orders of the defined types were placed in this encounter.  Patient Instructions   Try to cut back on sodium/salt in the diet to help with blood pressure.  No change in meds for now.  I will check labs on heart flutter feeling. If those occur more often, or are associated with any symptoms such as lightheadedness, dizziness, shortness of breath, or chest pains, will need to be seen by cardiology. Let me know.  Return to the clinic or go to the nearest emergency room if any of your symptoms worsen or new symptoms occur.   I will refer you to podiatry. If any worsening toe symptoms prior to that visit - return here, urgent care or ER if needed.   shingrix vaccine #2 appears to be due - can be given at pharmacy.    Managing Your Hypertension Hypertension is commonly called  high blood pressure. This is when the force of your blood pressing against the walls of your arteries is too strong. Arteries are blood vessels that carry blood from your heart throughout your body. Hypertension forces the heart to work harder to pump blood, and may cause the arteries to become narrow or stiff. Having untreated or uncontrolled hypertension can cause  heart attack, stroke, kidney disease, and other problems. What are blood pressure readings? A blood pressure reading consists of a higher number over a lower number. Ideally, your blood pressure should be below 120/80. The first ("top") number is called the systolic pressure. It is a measure of the pressure in your arteries as your heart beats. The second ("bottom") number is called the diastolic pressure. It is a measure of the pressure in your arteries as the heart relaxes. What does my blood pressure reading mean? Blood pressure is classified into four stages. Based on your blood pressure reading, your health care provider may use the following stages to determine what type of treatment you need, if any. Systolic pressure and diastolic pressure are measured in a unit called mm Hg. Normal  Systolic pressure: below 494.  Diastolic pressure: below 80. Elevated  Systolic pressure: 496-759.  Diastolic pressure: below 80. Hypertension stage 1  Systolic pressure: 163-846.  Diastolic pressure: 65-99. Hypertension stage 2  Systolic pressure: 357 or above.  Diastolic pressure: 90 or above. What health risks are associated with hypertension? Managing your hypertension is an important responsibility. Uncontrolled hypertension can lead to:  A heart attack.  A stroke.  A weakened blood vessel (aneurysm).  Heart failure.  Kidney damage.  Eye damage.  Metabolic syndrome.  Memory and concentration problems. What changes can I make to manage my hypertension? Hypertension can be managed by making lifestyle changes and possibly by taking medicines. Your health care provider will help you make a plan to bring your blood pressure within a normal range. Eating and drinking   Eat a diet that is high in fiber and potassium, and low in salt (sodium), added sugar, and fat. An example eating plan is called the DASH (Dietary Approaches to Stop Hypertension) diet. To eat this way: ? Eat plenty  of fresh fruits and vegetables. Try to fill half of your plate at each meal with fruits and vegetables. ? Eat whole grains, such as whole wheat pasta, brown rice, or whole grain bread. Fill about one quarter of your plate with whole grains. ? Eat low-fat diary products. ? Avoid fatty cuts of meat, processed or cured meats, and poultry with skin. Fill about one quarter of your plate with lean proteins such as fish, chicken without skin, beans, eggs, and tofu. ? Avoid premade and processed foods. These tend to be higher in sodium, added sugar, and fat.  Reduce your daily sodium intake. Most people with hypertension should eat less than 1,500 mg of sodium a day.  Limit alcohol intake to no more than 1 drink a day for nonpregnant women and 2 drinks a day for men. One drink equals 12 oz of beer, 5 oz of wine, or 1 oz of hard liquor. Lifestyle  Work with your health care provider to maintain a healthy body weight, or to lose weight. Ask what an ideal weight is for you.  Get at least 30 minutes of exercise that causes your heart to beat faster (aerobic exercise) most days of the week. Activities may include walking, swimming, or biking.  Include exercise to strengthen your muscles (resistance exercise),  such as weight lifting, as part of your weekly exercise routine. Try to do these types of exercises for 30 minutes at least 3 days a week.  Do not use any products that contain nicotine or tobacco, such as cigarettes and e-cigarettes. If you need help quitting, ask your health care provider.  Control any long-term (chronic) conditions you have, such as high cholesterol or diabetes. Monitoring  Monitor your blood pressure at home as told by your health care provider. Your personal target blood pressure may vary depending on your medical conditions, your age, and other factors.  Have your blood pressure checked regularly, as often as told by your health care provider. Working with your health care  provider  Review all the medicines you take with your health care provider because there may be side effects or interactions.  Talk with your health care provider about your diet, exercise habits, and other lifestyle factors that may be contributing to hypertension.  Visit your health care provider regularly. Your health care provider can help you create and adjust your plan for managing hypertension. Will I need medicine to control my blood pressure? Your health care provider may prescribe medicine if lifestyle changes are not enough to get your blood pressure under control, and if:  Your systolic blood pressure is 130 or higher.  Your diastolic blood pressure is 80 or higher. Take medicines only as told by your health care provider. Follow the directions carefully. Blood pressure medicines must be taken as prescribed. The medicine does not work as well when you skip doses. Skipping doses also puts you at risk for problems. Contact a health care provider if:  You think you are having a reaction to medicines you have taken.  You have repeated (recurrent) headaches.  You feel dizzy.  You have swelling in your ankles.  You have trouble with your vision. Get help right away if:  You develop a severe headache or confusion.  You have unusual weakness or numbness, or you feel faint.  You have severe pain in your chest or abdomen.  You vomit repeatedly.  You have trouble breathing. Summary  Hypertension is when the force of blood pumping through your arteries is too strong. If this condition is not controlled, it may put you at risk for serious complications.  Your personal target blood pressure may vary depending on your medical conditions, your age, and other factors. For most people, a normal blood pressure is less than 120/80.  Hypertension is managed by lifestyle changes, medicines, or both. Lifestyle changes include weight loss, eating a healthy, low-sodium diet, exercising  more, and limiting alcohol. This information is not intended to replace advice given to you by your health care provider. Make sure you discuss any questions you have with your health care provider. Document Revised: 12/27/2018 Document Reviewed: 08/02/2016 Elsevier Patient Education  [place].   Palpitations Palpitations are feelings that your heartbeat is irregular or is faster than normal. It may feel like your heart is fluttering or skipping a beat. Palpitations are usually not a serious problem. They may be caused by many things, including smoking, caffeine, alcohol, stress, and certain medicines or drugs. Most causes of palpitations are not serious. However, some palpitations can be a sign of a serious problem. You may need further tests to rule out serious medical problems. Follow these instructions at home:     Pay attention to any changes in your condition. Take these actions to help manage your symptoms: Eating and drinking  Avoid foods and drinks that may cause palpitations. These may include: ? Caffeinated coffee, tea, soft drinks, diet pills, and energy drinks. ? Chocolate. ? Alcohol. Lifestyle  Take steps to reduce your stress and anxiety. Things that can help you relax include: ? Yoga. ? Mind-body activities, such as deep breathing, meditation, or using words and images to create positive thoughts (guided imagery). ? Physical activity, such as swimming, jogging, or walking. Tell your health care provider if your palpitations increase with activity. If you have chest pain or shortness of breath with activity, do not continue the activity until you are seen by your health care provider. ? Biofeedback. This is a method that helps you learn to use your mind to control things in your body, such as your heartbeat.  Do not use drugs, including cocaine or ecstasy. Do not use marijuana.  Get plenty of rest and sleep. Keep a regular bed time. General instructions  Take  over-the-counter and prescription medicines only as told by your health care provider.  Do not use any products that contain nicotine or tobacco, such as cigarettes and e-cigarettes. If you need help quitting, ask your health care provider.  Keep all follow-up visits as told by your health care provider. This is important. These may include visits for further testing if palpitations do not go away or get worse. Contact a health care provider if you:  Continue to have a fast or irregular heartbeat after 24 hours.  Notice that your palpitations occur more often. Get help right away if you:  Have chest pain or shortness of breath.  Have a severe headache.  Feel dizzy or you faint. Summary  Palpitations are feelings that your heartbeat is irregular or is faster than normal. It may feel like your heart is fluttering or skipping a beat.  Palpitations may be caused by many things, including smoking, caffeine, alcohol, stress, certain medicines, and drugs.  Although most causes of palpitations are not serious, some causes can be a sign of a serious medical problem.  Get help right away if you faint or have chest pain, shortness of breath, a severe headache, or dizziness. This information is not intended to replace advice given to you by your health care provider. Make sure you discuss any questions you have with your health care provider. Document Revised: 10/17/2017 Document Reviewed: 10/17/2017 Elsevier Patient Education  2020 [employer].   Managing Your Hypertension Hypertension is commonly called high blood pressure. This is when the force of your blood pressing against the walls of your arteries is too strong. Arteries are blood vessels that carry blood from your heart throughout your body. Hypertension forces the heart to work harder to pump blood, and may cause the arteries to become narrow or stiff. Having untreated or uncontrolled hypertension can cause heart attack, stroke,  kidney disease, and other problems. What are blood pressure readings? A blood pressure reading consists of a higher number over a lower number. Ideally, your blood pressure should be below 120/80. The first ("top") number is called the systolic pressure. It is a measure of the pressure in your arteries as your heart beats. The second ("bottom") number is called the diastolic pressure. It is a measure of the pressure in your arteries as the heart relaxes. What does my blood pressure reading mean? Blood pressure is classified into four stages. Based on your blood pressure reading, your health care provider may use the following stages to determine what type of treatment you need, if any.  Systolic pressure and diastolic pressure are measured in a unit called mm Hg. Normal  Systolic pressure: below 093.  Diastolic pressure: below 80. Elevated  Systolic pressure: 818-299.  Diastolic pressure: below 80. Hypertension stage 1  Systolic pressure: 371-696.  Diastolic pressure: 78-93. Hypertension stage 2  Systolic pressure: 810 or above.  Diastolic pressure: 90 or above. What health risks are associated with hypertension? Managing your hypertension is an important responsibility. Uncontrolled hypertension can lead to:  A heart attack.  A stroke.  A weakened blood vessel (aneurysm).  Heart failure.  Kidney damage.  Eye damage.  Metabolic syndrome.  Memory and concentration problems. What changes can I make to manage my hypertension? Hypertension can be managed by making lifestyle changes and possibly by taking medicines. Your health care provider will help you make a plan to bring your blood pressure within a normal range. Eating and drinking   Eat a diet that is high in fiber and potassium, and low in salt (sodium), added sugar, and fat. An example eating plan is called the DASH (Dietary Approaches to Stop Hypertension) diet. To eat this way: ? Eat plenty of fresh fruits and  vegetables. Try to fill half of your plate at each meal with fruits and vegetables. ? Eat whole grains, such as whole wheat pasta, brown rice, or whole grain bread. Fill about one quarter of your plate with whole grains. ? Eat low-fat diary products. ? Avoid fatty cuts of meat, processed or cured meats, and poultry with skin. Fill about one quarter of your plate with lean proteins such as fish, chicken without skin, beans, eggs, and tofu. ? Avoid premade and processed foods. These tend to be higher in sodium, added sugar, and fat.  Reduce your daily sodium intake. Most people with hypertension should eat less than 1,500 mg of sodium a day.  Limit alcohol intake to no more than 1 drink a day for nonpregnant women and 2 drinks a day for men. One drink equals 12 oz of beer, 5 oz of wine, or 1 oz of hard liquor. Lifestyle  Work with your health care provider to maintain a healthy body weight, or to lose weight. Ask what an ideal weight is for you.  Get at least 30 minutes of exercise that causes your heart to beat faster (aerobic exercise) most days of the week. Activities may include walking, swimming, or biking.  Include exercise to strengthen your muscles (resistance exercise), such as weight lifting, as part of your weekly exercise routine. Try to do these types of exercises for 30 minutes at least 3 days a week.  Do not use any products that contain nicotine or tobacco, such as cigarettes and e-cigarettes. If you need help quitting, ask your health care provider.  Control any long-term (chronic) conditions you have, such as high cholesterol or diabetes. Monitoring  Monitor your blood pressure at home as told by your health care provider. Your personal target blood pressure may vary depending on your medical conditions, your age, and other factors.  Have your blood pressure checked regularly, as often as told by your health care provider. Working with your health care provider  Review all  the medicines you take with your health care provider because there may be side effects or interactions.  Talk with your health care provider about your diet, exercise habits, and other lifestyle factors that may be contributing to hypertension.  Visit your health care provider regularly. Your health care provider can help you create and adjust  your plan for managing hypertension. Will I need medicine to control my blood pressure? Your health care provider may prescribe medicine if lifestyle changes are not enough to get your blood pressure under control, and if:  Your systolic blood pressure is 130 or higher.  Your diastolic blood pressure is 80 or higher. Take medicines only as told by your health care provider. Follow the directions carefully. Blood pressure medicines must be taken as prescribed. The medicine does not work as well when you skip doses. Skipping doses also puts you at risk for problems. Contact a health care provider if:  You think you are having a reaction to medicines you have taken.  You have repeated (recurrent) headaches.  You feel dizzy.  You have swelling in your ankles.  You have trouble with your vision. Get help right away if:  You develop a severe headache or confusion.  You have unusual weakness or numbness, or you feel faint.  You have severe pain in your chest or abdomen.  You vomit repeatedly.  You have trouble breathing. Summary  Hypertension is when the force of blood pumping through your arteries is too strong. If this condition is not controlled, it may put you at risk for serious complications.  Your personal target blood pressure may vary depending on your medical conditions, your age, and other factors. For most people, a normal blood pressure is less than 120/80.  Hypertension is managed by lifestyle changes, medicines, or both. Lifestyle changes include weight loss, eating a healthy, low-sodium diet, exercising more, and limiting  alcohol. This information is not intended to replace advice given to you by your health care provider. Make sure you discuss any questions you have with your health care provider. Document Revised: 12/27/2018 Document Reviewed: 08/02/2016 Elsevier Patient Education  [employer].   If you have lab work done today you will be contacted with your lab results within the next 2 weeks.  If you have not heard from [place] then please contact us. The fastest way to get your results is to register for My Chart.   IF you received an x-ray today, you will receive an invoice from [facility]. Please contact [facility] at [phone] with questions or concerns regarding your invoice.   IF you received labwork today, you will receive an invoice from [place]. Please contact [facility] at [phone] with questions or concerns regarding your invoice.   Our billing staff will not be able to assist you with questions regarding bills from these companies.  You will be contacted with the lab results as soon as they are available. The fastest way to get your results is to activate your My Chart account. Instructions are located on the last page of this paperwork. If you have not heard from [place] regarding the results in 2 weeks, please contact this office.         Signed, [name], MD [facility]

## 2020-07-13 LAB — COMPREHENSIVE METABOLIC PANEL
ALT: 25 IU/L (ref 0–44)
AST: 18 IU/L (ref 0–40)
Albumin/Globulin Ratio: 1.9 (ref 1.2–2.2)
Albumin: 4.4 g/dL (ref 3.8–4.8)
Alkaline Phosphatase: 104 IU/L (ref 44–121)
BUN/Creatinine Ratio: 15 (ref 10–24)
BUN: 13 mg/dL (ref 8–27)
Bilirubin Total: 0.4 mg/dL (ref 0.0–1.2)
CO2: 24 mmol/L (ref 20–29)
Calcium: 9.3 mg/dL (ref 8.6–10.2)
Chloride: 104 mmol/L (ref 96–106)
Creatinine, Ser: 0.84 mg/dL (ref 0.76–1.27)
GFR calc Af Amer: 104 mL/min/{1.73_m2} (ref >59)
GFR calc non Af Amer: 90 mL/min/{1.73_m2} (ref >59)
Globulin, Total: 2.3 g/dL (ref 1.5–4.5)
Glucose: 96 mg/dL (ref 65–99)
Potassium: 4.2 mmol/L (ref 3.5–5.2)
Sodium: 138 mmol/L (ref 134–144)
Total Protein: 6.7 g/dL (ref 6.0–8.5)

## 2020-07-13 LAB — CBC
Hematocrit: 47.6 % (ref 37.5–51.0)
Hemoglobin: 16 g/dL (ref 13.0–17.7)
MCH: 30.4 pg (ref 26.6–33.0)
MCHC: 33.6 g/dL (ref 31.5–35.7)
MCV: 90 fL (ref 79–97)
Platelets: 281 10*3/uL (ref 150–450)
RBC: 5.27 x10E6/uL (ref 4.14–5.80)
RDW: 13.1 % (ref 11.6–15.4)
WBC: 4.7 10*3/uL (ref 3.4–10.8)

## 2020-07-13 LAB — LIPID PANEL
Chol/HDL Ratio: 4 ratio (ref 0.0–5.0)
Cholesterol, Total: 155 mg/dL (ref 100–199)
HDL: 39 mg/dL — ABNORMAL LOW (ref >39)
LDL Chol Calc (NIH): 101 mg/dL — ABNORMAL HIGH (ref 0–99)
Triglycerides: 76 mg/dL (ref 0–149)
VLDL Cholesterol Cal: 15 mg/dL (ref 5–40)

## 2020-07-13 LAB — TSH: TSH: 0.939 u[IU]/mL (ref 0.450–4.500)

## 2020-07-26 ENCOUNTER — Ambulatory Visit: Payer: 59 | Admitting: Podiatry

## 2020-08-09 ENCOUNTER — Ambulatory Visit: Payer: 59 | Admitting: Emergency Medicine

## 2020-08-09 ENCOUNTER — Other Ambulatory Visit: Payer: Self-pay

## 2020-08-09 ENCOUNTER — Encounter: Payer: Self-pay | Admitting: Emergency Medicine

## 2020-08-09 ENCOUNTER — Ambulatory Visit: Payer: 59 | Admitting: Podiatry

## 2020-08-09 DIAGNOSIS — J449 Chronic obstructive pulmonary disease, unspecified: Secondary | ICD-10-CM | POA: Diagnosis not present

## 2020-08-09 DIAGNOSIS — K219 Gastro-esophageal reflux disease without esophagitis: Secondary | ICD-10-CM

## 2020-08-09 DIAGNOSIS — J309 Allergic rhinitis, unspecified: Secondary | ICD-10-CM | POA: Diagnosis not present

## 2020-08-09 NOTE — Assessment & Plan Note (Signed)
We will continue Anoro 1 inhalation once daily. We will refill albuterol. Keep this available in case you need to use 2 puffs for shortness of breath, chest tightness, wheezing COVID-19 vaccine, flu shot both up-to-date Follow with Dr. [name] in 12 months or sooner if you have any problems.

## 2020-08-09 NOTE — Assessment & Plan Note (Signed)
Continue your esomeprazole as you have been taking it for stomach acid

## 2020-08-09 NOTE — Assessment & Plan Note (Signed)
For your fluticasone nasal spray 2 sprays each nostril once daily when you need it for nasal congestion and drainage or if your cough is flaring.

## 2020-08-09 NOTE — Patient Instructions (Addendum)
We will continue Anoro 1 inhalation once daily. We will refill albuterol. Keep this available in case you need to use 2 puffs for shortness of breath, chest tightness, wheezing Continue your esomeprazole as you have been taking it for stomach acid For your fluticasone nasal spray 2 sprays each nostril once daily when you need it for nasal congestion and drainage or if your cough is flaring. COVID-19 vaccine, flu shot both up-to-date Follow with Dr. [name] in 12 months or sooner if you have any problems.

## 2020-08-09 NOTE — Progress Notes (Signed)
Subjective:    Patient ID: [name], male    DOB: Sep 17, 1951, 69 y.o.   MRN: [id number]  HPI  ROV 06/10/18 --69 year old former smoker (30 pack years) with a history of COPD, benign pulmonary nodules that we followed with serial CT scans of the chest.  He has some chronic cough in the setting of GERD, chronic rhinitis.  He is also been dealing with mouth soreness and tongue soreness that have been thought to possibly been related to thrush versus irritation from his medications. Last time we started him on Incruse, he feels that he is benefiting, breathing well. He is not having any increased cough or mouth sx on it so far. No wheeze. No flares since last time. He is using flonase daily, generic esomeprazole 40mg  daily.   ROV 08/29/2019 --this is an annual follow-up visit for 69 year old gentleman with a history of COPD and benign pulmonary nodules that were followed with serial CT scans of the chest.  He has some chronic cough in the setting of GERD and chronic rhinitis.  We have been managing him on Incruse, Flonase prn, esomeprazole 40 mg daily. He has been experiencing increase dyspnea, especially in the am or evening when he is laying down. He hears some wheeze at those times as well. His exertional tolerance may be a bit down. He has some dry cough - intermittent GERD even on esomeprazole. He had Prevnar last year, flu shot up to date.   ROV 08/09/20 --[name] is 69, follows up today for history of COPD.  He was also followed with serial imaging for pulmonary nodules that were deemed benign after stability.  He has some chronic cough, GERD, chronic rhinitis as well.  Today he reports he remains active, is able to do his usual activities without SOB. He does feels some SOB initially when he gets up out of bed, then resolves. We changed Incruse to Anoro. He has never needed albuterol, will get it refilled. Still able to work.  COVID and flu shots up to date   Review of Systems    Constitutional: Negative for fever and unexpected weight change.  HENT: Negative for congestion, dental problem, ear pain, nosebleeds, postnasal drip, rhinorrhea, sinus pressure, sneezing, sore throat and trouble swallowing.   Eyes: Negative for redness and itching.  Respiratory: Negative for chest tightness, shortness of breath and wheezing.   Cardiovascular: Negative for palpitations and leg swelling.       Knees   Gastrointestinal: Negative for nausea and vomiting.  Genitourinary: Negative for dysuria.  Musculoskeletal: Negative for joint swelling.  Skin: Negative for rash.  Neurological: Negative for headaches.  Hematological: Does not bruise/bleed easily.  Psychiatric/Behavioral: Negative for dysphoric mood. The patient is not nervous/anxious.         Objective:   Physical Exam Vitals:   08/09/20 1113  BP: 132/80  Pulse: 72  Temp: 98.2 F (36.8 C)  SpO2: 96%  Weight: 224 lb 3.2 oz (101.7 kg)  Height: 5\' 11"  (1.803 m)   Gen: Pleasant, well-nourished, in no distress,  normal affect  ENT: No lesions,  mouth clear,  oropharynx clear, no postnasal drip  Neck: No JVD, no stridor  Lungs: No use of accessory muscles, clear without rales or rhonchi  Cardiovascular: RRR, heart sounds normal, no murmur or gallops, no peripheral edema  Musculoskeletal: No deformities, no cyanosis or clubbing  Neuro: alert, non focal  Skin: Warm, no lesions or rash  07/13/14 --  COMPARISON: 01/05/2014  FINDINGS: Mediastinum: Normal  heart size. No pericardial effusion. The trachea appears patent and is midline. Normal appearance of the esophagus. No mediastinal or hilar adenopathy identified.  Lungs/Pleura: There is no pleural effusion identified. Index nodule in the right upper lobe measures 4 mm and is unchanged from previous exam. Subpleural nodule within the anterior right upper lobe is unchanged measuring 4 mm, image 24/series 3. Subpleural right middle lobe nodule is  unchanged measuring 3 mm, image 33/series 3. No new or enlarging pulmonary nodules or mass is identified. Sub solid lesion within the right base measures 9 mm, image 105 of series 602. This is stable when compared with 01/05/2014 but is new from 07/04/2013.  Upper Abdomen: The visualized portions of the liver, gallbladder, and pancreas are unremarkable. The spleen is negative.  Musculoskeletal: Review of the visualized bony structures is on unremarkable. No aggressive lytic or sclerotic bone lesions.  IMPRESSION: 1. Stable small solid pulmonary nodules. Compatible with benign lesions. 2. There is a 9 mm sub solid lesion in the right base which is unchanged from 01/05/2014 but new from 07/04/2013. This will need annual surveillance for minimum of 36 months to document of benignity. The next followup examination should be obtained on 07/14/2015. A PET-CT would be of limited value in evaluating this lesion. This recommendation follows the consensus statement: Recommendations for the Management of Subsolid Pulmonary Nodules Detected at CT: A Statement from the [facility] as published in Radiology 2013; 266:304-317.     Assessment & Plan:  COPD, mild We will continue Anoro 1 inhalation once daily. We will refill albuterol. Keep this available in case you need to use 2 puffs for shortness of breath, chest tightness, wheezing COVID-19 vaccine, flu shot both up-to-date Follow with Dr. [name] in 12 months or sooner if you have any problems.   Allergic rhinitis For your fluticasone nasal spray 2 sprays each nostril once daily when you need it for nasal congestion and drainage or if your cough is flaring.  GERD (gastroesophageal reflux disease) Continue your esomeprazole as you have been taking it for stomach acid  Baltazar Apo, MD, PhD 08/09/2020, 11:28 AM [facility] [phone] or if no answer [phone]

## 2020-08-16 ENCOUNTER — Encounter: Payer: Self-pay | Admitting: Podiatry

## 2020-08-16 ENCOUNTER — Ambulatory Visit (INDEPENDENT_AMBULATORY_CARE_PROVIDER_SITE_OTHER): Payer: 59

## 2020-08-16 ENCOUNTER — Ambulatory Visit: Payer: 59 | Admitting: Podiatry

## 2020-08-16 ENCOUNTER — Other Ambulatory Visit: Payer: Self-pay

## 2020-08-16 DIAGNOSIS — M21611 Bunion of right foot: Secondary | ICD-10-CM

## 2020-08-16 DIAGNOSIS — M21612 Bunion of left foot: Secondary | ICD-10-CM

## 2020-08-18 NOTE — Progress Notes (Signed)
Subjective:   Patient ID: [name], male   DOB: 69 y.o.   MRN: [id number]   HPI 69 year old male presents the office today for concerns of painful calluses to both feet with the right side worse than left.  The right side he has also noticed the toe is starting to shift to the side quite a bit which causes discomfort as well.  He feels that surgery is the only way in order to help with this.  Is tried shoe modifications.  He is on his feet quite a bit he works at [employer].   Review of Systems  All other systems reviewed and are negative.  Past Medical History:  Diagnosis Date  . Allergy    seasonal  . Arthritis    oa  . Asthma   . COPD (chronic obstructive pulmonary disease) ([place])   . GERD (gastroesophageal reflux disease)   . Hypertension   . Prostate cancer ([place]) 01/2016   prostatectomy   . Shortness of breath dyspnea     Past Surgical History:  Procedure Laterality Date  . HERNIA REPAIR  last done 3-4 yrs ago   x 2  . INGUINAL HERNIA REPAIR Right 11/20/2013   Procedure: OPEN REPAIR OF RECURRENT RIGHT INGUINAL HERNIA WITH MESH;  Surgeon: [name]. [name], MD;  Location: [facility];  Service: General;  Laterality: Right;  . INSERTION OF MESH Right 11/20/2013   Procedure: INSERTION OF MESH;  Surgeon: [name]. [name], MD;  Location: [facility];  Service: General;  Laterality: Right;  . KNEE SURGERY Left july 2014   mcl and meniscus repair  . LYMPHADENECTOMY Bilateral 02/09/2016   Procedure: PELVIC LYMPHADENECTOMY;  Surgeon: [name], MD;  Location: [facility];  Service: Urology;  Laterality: Bilateral;  . right knee arthroscopy     . ROBOT ASSISTED LAPAROSCOPIC RADICAL PROSTATECTOMY N/A 02/09/2016   Procedure: XI ROBOTIC ASSISTED LAPAROSCOPIC RADICAL PROSTATECTOMY WITH INDOCYANINE GREEN DYE AND ADHESIOLYSIS;  Surgeon: [name], MD;  Location: [facility];  Service: Urology;  Laterality: N/A;  . TOTAL KNEE ARTHROPLASTY Right 09/06/2015   Procedure: RIGHT TOTAL KNEE ARTHROPLASTY;   Surgeon: [name], MD;  Location: [facility];  Service: Orthopedics;  Laterality: Right;  . TOTAL KNEE ARTHROPLASTY Left 06/17/2019   Procedure: TOTAL KNEE ARTHROPLASTY;  Surgeon: [name], MD;  Location: [facility];  Service: Orthopedics;  Laterality: Left;  70 mins    Allergies  Allergen Reactions  . Percocet [Oxycodone-Acetaminophen] Other (See Comments)    Nausea-Doesn't work  . Amoxicillin Rash    Has patient had a PCN reaction causing immediate rash, facial/tongue/throat swelling, SOB or lightheadedness with hypotension: No Has patient had a PCN reaction causing severe rash involving mucus membranes or skin necrosis: No Has patient had a PCN reaction that required hospitalization No Has patient had a PCN reaction occurring within the last 10 years: Yes- 2016 If all of the above answers are "NO", then may proceed with Cephalosporin use.        Objective:  Physical Exam  General: AAO x3, NAD  Dermatological: Hyperkeratotic lesion on the right hallux, right medial first MPJ as well as the left third toe with left hallux.  No ulcerations identified no signs of infection.  Vascular: Dorsalis Pedis artery and Posterior Tibial artery pedal pulses are 2/4 bilateral with immedate capillary fill time.  There is no pain with calf compression, swelling, warmth, erythema.   Neruologic: Grossly intact via light touch bilateral.  Musculoskeletal: Significant bunion is present to right side  worse than left.  Hammertoe contractures are present most of the left third toe resulting in hyperkeratotic lesion.  There is decrease tenderness in the first MPJ on the right side.  Muscular strength 5/5 in all groups tested bilateral.  Gait: Unassisted, Nonantalgic.       Assessment:   Severe bunion right foot resulting hyperkeratotic lesions as well as hammertoe, bunion left foot     Plan:  -Treatment options discussed including all alternatives, risks, and complications -Etiology of symptoms  were discussed -X-rays were obtained and reviewed with the patient.  Severe bunion is present in the right foot.  Hammertoe contractures are present.  There is no evidence of acute fracture. -We discussed conservative as well as surgical treatment options.  For now we will continue offloading pad centimeter hyperkeratotic lesion with any complications or bleeding.  Discussed custom inserts as well.  He is interested in surgical intervention.  I discussed with him first MTPJ arthrodesis on the right side to help realign the toe.  He can consider to this after the first of the year if needed.  Trula Slade DPM

## 2020-08-28 ENCOUNTER — Other Ambulatory Visit: Payer: Self-pay | Admitting: Dermatology

## 2020-08-30 ENCOUNTER — Other Ambulatory Visit: Payer: Self-pay | Admitting: Family Medicine

## 2020-08-30 DIAGNOSIS — I10 Essential (primary) hypertension: Secondary | ICD-10-CM

## 2020-09-13 ENCOUNTER — Telehealth: Payer: Self-pay | Admitting: Emergency Medicine

## 2020-09-13 NOTE — Telephone Encounter (Signed)
I will send rx but need the name of his pharmacy  Called pt and had to [facility]

## 2020-09-14 MED ORDER — ALBUTEROL SULFATE HFA 108 (90 BASE) MCG/ACT IN AERS: INHALATION_SPRAY | RESPIRATORY_TRACT | 3 refills | Status: DC

## 2020-09-14 NOTE — Telephone Encounter (Signed)
Called and left message for patient advising him that his rescue inhaler has been sent to his pharmacy of choice and to call the office with any questions.

## 2020-09-14 NOTE — Telephone Encounter (Signed)
Pt returning call from office regarding which pharmacy to use ... pt is requesting [id number] - [place], [place] - [address] for all medications. Please advise

## 2020-09-15 ENCOUNTER — Other Ambulatory Visit: Payer: Self-pay | Admitting: Emergency Medicine

## 2020-09-15 MED ORDER — ALBUTEROL SULFATE HFA 108 (90 BASE) MCG/ACT IN AERS: INHALATION_SPRAY | RESPIRATORY_TRACT | 3 refills | Status: DC

## 2020-09-22 ENCOUNTER — Other Ambulatory Visit: Payer: Self-pay | Admitting: Family Medicine

## 2020-09-27 ENCOUNTER — Ambulatory Visit: Payer: 59 | Admitting: Podiatry

## 2020-09-27 ENCOUNTER — Other Ambulatory Visit: Payer: Self-pay

## 2020-09-27 DIAGNOSIS — M79671 Pain in right foot: Secondary | ICD-10-CM | POA: Diagnosis not present

## 2020-09-27 DIAGNOSIS — M21619 Bunion of unspecified foot: Secondary | ICD-10-CM | POA: Diagnosis not present

## 2020-09-27 DIAGNOSIS — L84 Corns and callosities: Secondary | ICD-10-CM

## 2020-09-27 NOTE — Patient Instructions (Signed)

## 2020-09-30 NOTE — Progress Notes (Signed)
Subjective: 70 year old male presents the office today for surgical consultation of a painful bunion the right foot.  He does develop painful calluses to the area on the bunion as well as the IPJ of the hallux which causes comfortably trimming self.  He has tried offloading, padding without any significant improvement he wants to proceed with surgery in April. Denies any systemic complaints such as fevers, chills, nausea, vomiting. No acute changes since last appointment, and no other complaints at this time.   Objective: AAO x3, NAD DP/PT pulses palpable bilaterally, CRT less than 3 seconds Moderate bunion is present.  There is mild restriction 1st MPJ range of motion.  Hyperkeratotic lesions medial MPJ, IPJ.  There is no underlying ulceration drainage or any signs of infection.  Tenderness in the 1st MPJ with range of motion.  MMT 5/5.  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  -Reviewed the x-rays with him and his wife from last appointment.  Discussed with conservative as well as surgical treatment options.  This time he is attempted conservative treatment was to proceed with surgical intervention.  Discussed with him osteotomy versus MPJ arthrodesis.  After discussion we agreed to proceed with 1st MPJ arthrodesis given the arthritis as well as the bunion. -The incision placement as well as the postoperative course was discussed with the patient. I discussed risks of the surgery which include, but not limited to, infection, bleeding, pain, swelling, need for further surgery, delayed or nonhealing, painful or ugly scar, numbness or sensation changes, over/under correction, recurrence, transfer lesions, further deformity, hardware failure, DVT/PE, loss of toe/foot. Patient understands these risks and wishes to proceed with  surgery. The surgical consent was reviewed with the patient all 3 pages were signed. No promises or guarantees were given to the outcome of the procedure. All questions were answered to the best of my ability. Before the surgery the patient was encouraged to call the office if there is any further questions. The surgery will be performed at the [facility] on an outpatient basis. -Will plan for surgery on April 27th after he gets back from [place].   No follow-ups on file.  Trula Slade DPM

## 2020-10-19 ENCOUNTER — Other Ambulatory Visit: Payer: Self-pay | Admitting: Emergency Medicine

## 2020-11-10 ENCOUNTER — Other Ambulatory Visit: Payer: Self-pay | Admitting: Family Medicine

## 2020-11-10 DIAGNOSIS — E785 Hyperlipidemia, unspecified: Secondary | ICD-10-CM

## 2020-11-10 NOTE — Telephone Encounter (Signed)
Notes to clinic:  medication filled by a historical provider  Review for refill    Requested Prescriptions  Pending Prescriptions Disp Refills   amLODipine (NORVASC) 5 MG tablet [Pharmacy Med Name: amLODIPine Besylate Oral Tablet 5 MG] 90 tablet 0    Sig: TAKE ONE TABLET BY MOUTH DAILY      Cardiovascular:  Calcium Channel Blockers Passed - 11/10/2020  6:44 AM      Passed - Last BP in normal range    BP Readings from Last 1 Encounters:  08/09/20 132/80          Passed - Valid encounter within last 6 months    Recent Outpatient Visits           4 months ago Essential hypertension   [facility], Ranell Patrick, MD   10 months ago Essential hypertension   [facility], Ranell Patrick, MD   1 year ago Essential hypertension   [facility], Ranell Patrick, MD   1 year ago Preoperative evaluation to rule out surgical contraindication   [facility], Ranell Patrick, MD   1 year ago Nonintractable episodic headache, unspecified headache type   [facility], Ranell Patrick, MD       Future Appointments             In 2 months [name], MD [facility] [place], [facility]              Signed Prescriptions Disp Refills   pravastatin (PRAVACHOL) 20 MG tablet 90 tablet 0    Sig: TAKE ONE TABLET BY MOUTH ONE TIME DAILY      Cardiovascular:  Antilipid - Statins Failed - 11/10/2020  6:44 AM      Failed - LDL in normal range and within 360 days    LDL Chol Calc (NIH)  Date Value Ref Range Status  07/12/2020 101 (H) 0 - 99 mg/dL Final          Failed - HDL in normal range and within 360 days    HDL  Date Value Ref Range Status  07/12/2020 39 (L) >39 mg/dL Final          Passed - Total Cholesterol in normal range and within 360 days    Cholesterol, Total  Date Value Ref Range Status  07/12/2020 155 100 - 199 mg/dL Final          Passed - Triglycerides in normal range and within 360 days     Triglycerides  Date Value Ref Range Status  07/12/2020 76 0 - 149 mg/dL Final          Passed - Patient is not pregnant      Passed - Valid encounter within last 12 months    Recent Outpatient Visits           4 months ago Essential hypertension   [facility], Ranell Patrick, MD   10 months ago Essential hypertension   [facility], Ranell Patrick, MD   1 year ago Essential hypertension   [facility], Ranell Patrick, MD   1 year ago Preoperative evaluation to rule out surgical contraindication   [facility], Ranell Patrick, MD   1 year ago Nonintractable episodic headache, unspecified headache type   [facility], Ranell Patrick, MD       Future Appointments  In 2 months [name], MD [facility] [place], [facility]

## 2020-12-20 ENCOUNTER — Telehealth: Payer: Self-pay | Admitting: Urology

## 2020-12-20 DIAGNOSIS — M79676 Pain in unspecified toe(s): Secondary | ICD-10-CM

## 2020-12-20 NOTE — Telephone Encounter (Signed)
DOS - 01/12/21  HALLUX MPJ FUSION RIGHT --- [id number]  [employer] EFFECTIVE DATE - 09/19/2003   PLAN DEDUCTIBLE - $250.00 W/ $250.00 REMAINING OUT OF POCKET - $1,500.00 W/ $1,500.00 REMAINING COPAY - $75.00 COINSURANCE - 10%  PER [employer] WEBSITE ([employer] INSURANCE) NO PRIOR AUTH IS REQUIRED FOR CPT CODE 21224. Transaction ID: [id number]

## 2021-01-10 ENCOUNTER — Encounter: Payer: Self-pay | Admitting: Family Medicine

## 2021-01-10 ENCOUNTER — Ambulatory Visit: Payer: 59 | Admitting: Family Medicine

## 2021-01-10 ENCOUNTER — Other Ambulatory Visit: Payer: Self-pay

## 2021-01-10 DIAGNOSIS — I10 Essential (primary) hypertension: Secondary | ICD-10-CM | POA: Diagnosis not present

## 2021-01-10 DIAGNOSIS — E785 Hyperlipidemia, unspecified: Secondary | ICD-10-CM | POA: Diagnosis not present

## 2021-01-10 DIAGNOSIS — J309 Allergic rhinitis, unspecified: Secondary | ICD-10-CM

## 2021-01-10 LAB — LIPID PANEL
Cholesterol: 129 mg/dL (ref 0–200)
HDL: 36.2 mg/dL — ABNORMAL LOW (ref >39.00)
LDL Cholesterol: 79 mg/dL (ref 0–99)
NonHDL: 93.05
Total CHOL/HDL Ratio: 4
Triglycerides: 71 mg/dL (ref 0.0–149.0)
VLDL: 14.2 mg/dL (ref 0.0–40.0)

## 2021-01-10 LAB — COMPREHENSIVE METABOLIC PANEL
ALT: 28 U/L (ref 0–53)
AST: 18 U/L (ref 0–37)
Albumin: 4.5 g/dL (ref 3.5–5.2)
Alkaline Phosphatase: 92 U/L (ref 39–117)
BUN: 11 mg/dL (ref 6–23)
CO2: 30 mEq/L (ref 19–32)
Calcium: 9.7 mg/dL (ref 8.4–10.5)
Chloride: 102 mEq/L (ref 96–112)
Creatinine, Ser: 0.84 mg/dL (ref 0.40–1.50)
GFR: 89.09 mL/min (ref >60.00)
Glucose, Bld: 82 mg/dL (ref 70–99)
Potassium: 3.9 mEq/L (ref 3.5–5.1)
Sodium: 137 mEq/L (ref 135–145)
Total Bilirubin: 0.6 mg/dL (ref 0.2–1.2)
Total Protein: 7.2 g/dL (ref 6.0–8.3)

## 2021-01-10 MED ORDER — FLUTICASONE PROPIONATE 50 MCG/ACT NA SUSP: 2.0000 | Freq: Every day | NASAL | 5 refills | Status: DC

## 2021-01-10 MED ORDER — AMLODIPINE BESYLATE 5 MG PO TABS
1.0000 | ORAL_TABLET | Freq: Every day | ORAL | 0 refills | Status: DC
Start: 2021-01-10 — End: 2021-05-30

## 2021-01-10 MED ORDER — AMLODIPINE BESYLATE 2.5 MG PO TABS: 2.5000 mg | ORAL_TABLET | Freq: Every day | ORAL | 1 refills | Status: DC

## 2021-01-10 MED ORDER — PRAVASTATIN SODIUM 20 MG PO TABS: 20.0000 mg | ORAL_TABLET | Freq: Every day | ORAL | 1 refills | Status: DC

## 2021-01-10 NOTE — Patient Instructions (Signed)
No med changes today. Good luck with surgery!

## 2021-01-10 NOTE — Progress Notes (Signed)
Subjective:  Patient ID: [name], male    DOB: [birth year]  Age: 70 y.o. MRN: [id number]  CC:  Chief Complaint  Patient presents with  . Hypertension    Pt here to follow up from 6 months ago HTN doing well denies physical symptoms   . Hyperlipidemia    Has tried to maintain a diet lower in fats and fried foods. No concern at this time     HPI [name] presents for   Hypertension: Amlodipine 7.5mg  qd.  No new side effects.  Home readings:none.  Still working at [employer] this Wednesday. Out for 2-3 months.  BP Readings from Last 3 Encounters:  01/10/21 134/68  08/09/20 132/80  07/12/20 140/86   Lab Results  Component Value Date   CREATININE 0.84 07/12/2020   Hyperlipidemia: Pravastatin 20mg  qd. No new side effects or new myalgias. Lab Results  Component Value Date   CHOL 155 07/12/2020   HDL 39 (L) 07/12/2020   LDLCALC 101 (H) 07/12/2020   TRIG 76 07/12/2020   CHOLHDL 4.0 07/12/2020   Lab Results  Component Value Date   ALT 25 07/12/2020   AST 18 07/12/2020   ALKPHOS 104 07/12/2020   BILITOT 0.4 07/12/2020   All rhinitis: Controlled with flonase usually.    History Patient Active Problem List   Diagnosis Date Noted  . S/P left TKA 06/17/2019  . 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 Pain in left knee 06/02/2019  . Allergic rhinitis 03/12/2017  . Thrush, oral 02/27/2017  . SUI (stress urinary incontinence), male 03/20/2016  . Prostate cancer ([place]) 02/09/2016  . Obese 09/08/2015  . S/P right TKA 09/06/2015  . S/P knee replacement 09/06/2015  . 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 Pulmonary nodules 02/23/2014  . Tobacco use disorder, moderate, in sustained remission 02/23/2014  . Recurrent right inguinal hernia 10/20/2013  . ED (erectile dysfunction) 08/26/2012  . Physical exam, annual 11/05/2011  . HTN (hypertension) 11/05/2011  . GERD (gastroesophageal reflux disease) 11/05/2011  . Chronic joint pain  11/05/2011   Past Medical History:  Diagnosis Date  . Allergy    seasonal  . Arthritis    oa  . Asthma   . COPD (chronic obstructive pulmonary disease) ([place])   . GERD (gastroesophageal reflux disease)   . Hypertension   . Prostate cancer ([place]) 01/2016   prostatectomy   . Shortness of breath dyspnea    Past Surgical History:  Procedure Laterality Date  . HERNIA REPAIR  last done 3-4 yrs ago   x 2  . INGUINAL HERNIA REPAIR Right 11/20/2013   Procedure: OPEN REPAIR OF RECURRENT RIGHT INGUINAL HERNIA WITH MESH;  Surgeon: [name]. [name], MD;  Location: [facility];  Service: General;  Laterality: Right;  . INSERTION OF MESH Right 11/20/2013   Procedure: INSERTION OF MESH;  Surgeon: [name]. [name], MD;  Location: [facility];  Service: General;  Laterality: Right;  . KNEE SURGERY Left july 2014   mcl and meniscus repair  . LYMPHADENECTOMY Bilateral 02/09/2016   Procedure: PELVIC LYMPHADENECTOMY;  Surgeon: [name], MD;  Location: [facility];  Service: Urology;  Laterality: Bilateral;  . right knee arthroscopy     . ROBOT ASSISTED LAPAROSCOPIC RADICAL PROSTATECTOMY N/A 02/09/2016   Procedure: XI ROBOTIC ASSISTED LAPAROSCOPIC RADICAL PROSTATECTOMY WITH INDOCYANINE GREEN DYE AND ADHESIOLYSIS;  Surgeon: [name], MD;  Location: [facility];  Service: Urology;  Laterality: N/A;  . TOTAL KNEE ARTHROPLASTY Right 09/06/2015  Procedure: RIGHT TOTAL KNEE ARTHROPLASTY;  Surgeon: [name], MD;  Location: [facility];  Service: Orthopedics;  Laterality: Right;  . TOTAL KNEE ARTHROPLASTY Left 06/17/2019   Procedure: TOTAL KNEE ARTHROPLASTY;  Surgeon: [name], MD;  Location: [facility];  Service: Orthopedics;  Laterality: Left;  70 mins   Allergies  Allergen Reactions  . Percocet [Oxycodone-Acetaminophen] Other (See Comments)    Nausea-Doesn't work  . Amoxicillin Rash    Has patient had a PCN reaction causing immediate rash, facial/tongue/throat swelling, SOB or lightheadedness with hypotension: No Has  patient had a PCN reaction causing severe rash involving mucus membranes or skin necrosis: No Has patient had a PCN reaction that required hospitalization No Has patient had a PCN reaction occurring within the last 10 years: Yes- 2016 If all of the above answers are "NO", then may proceed with Cephalosporin use.    Prior to Admission medications   Medication Sig Start Date End Date Taking? Authorizing Provider  albuterol (PROVENTIL HFA) 108 (90 Base) MCG/ACT inhaler Proventil HFA 90 mcg/actuation aerosol inhaler   2 puffs by inhalation route every 4-6 hours as needed. 09/15/20  Yes Collene Gobble, MD  amLODipine (NORVASC) 2.5 MG tablet TAKE ONE TABLET BY MOUTH ONE TIME DAILY 08/30/20  Yes [name], MD  amLODipine (NORVASC) 5 MG tablet TAKE ONE TABLET BY MOUTH DAILY 11/10/20  Yes [name], MD  ANORO ELLIPTA 62.5-25 MCG/INH AEPB INHALE ONE PUFF BY MOUTH INTO THE LUNGS ONE TIME DAILY 10/20/20  Yes Collene Gobble, MD  azelastine (OPTIVAR) 0.05 % ophthalmic solution Place 1 drop into both eyes 2 (two) times daily as needed. For itching/allergy symptoms 07/14/19  Yes [name], MD  celecoxib (CELEBREX) 200 MG capsule Take 1 capsule (200 mg total) by mouth 2 (two) times daily. 06/18/19  Yes Maurice March, PA-C  Cholecalciferol (VITAMIN D3) 50 MCG (2000 UT) TABS Take 2,000 Units by mouth daily.   Yes [provider]  clobetasol cream (TEMOVATE) 0.05 % APPLY TOPICALLY TO AFFECTED AREA(S) NIGHTLY AT BEDTIME 08/30/20  Yes Lavonna Monarch, MD  cyclobenzaprine (FLEXERIL) 5 MG tablet take one tablet by mouth three times daily as needed for muscle spasm. start at bedtime due to sedation 01/12/20  Yes [name], MD  fluticasone (KLS ALLER-FLO) 50 MCG/ACT nasal spray Place 2 sprays into both nostrils daily. 01/12/20  Yes [name], MD  INCRUSE ELLIPTA 62.5 MCG/INH AEPB INHALE ONE PUFF BY MOUTH INTO THE LUNGS ONCE DAILY 02/24/20  Yes Collene Gobble, MD  KLS  ESOMEPRAZOLE MAGNESIUM 20 MG capsule TAKE TWO CAPSULES BY MOUTH DAILY 09/22/20  Yes [name], MD  Menthol-Methyl Salicylate (SALONPAS PAIN RELIEF PATCH EX) Place 1 patch onto the skin daily as needed (pain.).   Yes [provider]  methocarbamol (ROBAXIN) 500 MG tablet Take 1 tablet (500 mg total) by mouth every 6 (six) hours as needed for muscle spasms. 06/18/19  Yes Griffith Citron R, PA-C  pravastatin (PRAVACHOL) 20 MG tablet TAKE ONE TABLET BY MOUTH ONE TIME DAILY 11/10/20  Yes [name], MD  triamcinolone cream (KENALOG) 0.1 % Apply 1 application topically 2 (two) times daily as needed. To affected areas only as needed. Patient taking differently: Apply 1 application topically 2 (two) times daily as needed (rash). 01/08/19  Yes [name], MD   Social History   Socioeconomic History  . Marital status: Married    Spouse name: Not on file  . Number of  children: Not on file  . Years of education: Not on file  . Highest education level: Not on file  Occupational History  . Not on file  Tobacco Use  . Smoking status: Former Smoker    Packs/day: 1.50    Years: 20.00    Pack years: 30.00    Types: Cigarettes    Quit date: 11/05/1999    Years since quitting: 21.1  . Smokeless tobacco: Never Used  Vaping Use  . Vaping Use: Never used  Substance and Sexual Activity  . Alcohol use: No    Comment: last used 25 years ago   . Drug use: Yes    Types: Cocaine    Comment: last used cocaine  and acid 25 yrs ago,none used since  . Sexual activity: Not on file  Other Topics Concern  . Not on file  Social History Narrative  . Not on file   Social Determinants of Health   Financial Resource Strain: Not on file  Food Insecurity: Not on file  Transportation Needs: Not on file  Physical Activity: Not on file  Stress: Not on file  Social Connections: Not on file  Intimate Partner Violence: Not on file    Review of Systems  Constitutional: Negative for fatigue  and unexpected weight change.  Eyes: Negative for visual disturbance.  Respiratory: Negative for cough, chest tightness and shortness of breath.   Cardiovascular: Negative for chest pain, palpitations and leg swelling.  Gastrointestinal: Negative for abdominal pain and blood in stool.  Neurological: Negative for dizziness, light-headedness and headaches.    Objective:   Vitals:   01/10/21 1030  BP: 134/68  Pulse: 78  Resp: 16  Temp: 98.2 F (36.8 C)  TempSrc: Temporal  SpO2: 97%  Weight: 224 lb 12.8 oz (102 kg)  Height: 5\' 11"  (1.803 m)     Physical Exam Vitals reviewed.  Constitutional:      Appearance: He is well-developed.  HENT:     Head: Normocephalic and atraumatic.  Eyes:     Pupils: Pupils are equal, round, and reactive to light.  Neck:     Vascular: No carotid bruit or JVD.  Cardiovascular:     Rate and Rhythm: Normal rate and regular rhythm.     Heart sounds: Normal heart sounds. No murmur heard.   Pulmonary:     Effort: Pulmonary effort is normal.     Breath sounds: Normal breath sounds. No rales.  Skin:    General: Skin is warm and dry.  Neurological:     Mental Status: He is alert and oriented to person, place, and time.      Assessment & Plan:  [name] is a 70 y.o. male . Essential hypertension - Plan: Comprehensive metabolic panel, amLODipine (NORVASC) 2.5 MG tablet, amLODipine (NORVASC) 5 MG tablet  -  Stable, tolerating current regimen. Medications refilled. Labs pending as above.   Hyperlipidemia, unspecified hyperlipidemia type - Plan: Lipid panel, Comprehensive metabolic panel, pravastatin (PRAVACHOL) 20 MG tablet  -  Stable, tolerating current regimen. Medications refilled. Labs pending as above.   Allergic rhinitis, unspecified seasonality, unspecified trigger - Plan: fluticasone (KLS ALLER-FLO) 50 MCG/ACT nasal spray  - recent flare, now controlled. Continue flonase.   Meds ordered this encounter  Medications  . amLODipine  (NORVASC) 2.5 MG tablet    Sig: Take 1 tablet (2.5 mg total) by mouth daily.    Dispense:  90 tablet    Refill:  1  . amLODipine (NORVASC) 5 MG  tablet    Sig: Take 1 tablet (5 mg total) by mouth daily. Combine with 2.5mg  for total dose 7.5mg  qd.    Dispense:  90 tablet    Refill:  0  . pravastatin (PRAVACHOL) 20 MG tablet    Sig: Take 1 tablet (20 mg total) by mouth daily.    Dispense:  90 tablet    Refill:  1  . fluticasone (KLS ALLER-FLO) 50 MCG/ACT nasal spray    Sig: Place 2 sprays into both nostrils daily.    Dispense:  16 g    Refill:  5    Per pharmacy-ins covers only [place] aller-flo   Patient Instructions  No med changes today. Good luck with surgery!      Signed, [name], MD [facility]

## 2021-01-12 ENCOUNTER — Other Ambulatory Visit: Payer: Self-pay | Admitting: Podiatry

## 2021-01-12 ENCOUNTER — Encounter: Payer: Self-pay | Admitting: Podiatry

## 2021-01-12 DIAGNOSIS — M2021 Hallux rigidus, right foot: Secondary | ICD-10-CM | POA: Diagnosis not present

## 2021-01-12 MED ORDER — PROMETHAZINE HCL 25 MG PO TABS: 25.0000 mg | ORAL_TABLET | Freq: Three times a day (TID) | ORAL | 0 refills | Status: DC | PRN

## 2021-01-12 MED ORDER — CLINDAMYCIN HCL 300 MG PO CAPS: 300.0000 mg | ORAL_CAPSULE | Freq: Three times a day (TID) | ORAL | 0 refills | Status: DC

## 2021-01-12 MED ORDER — HYDROCODONE-ACETAMINOPHEN 10-325 MG PO TABS: 1.0000 | ORAL_TABLET | Freq: Three times a day (TID) | ORAL | 0 refills | Status: DC | PRN

## 2021-01-12 NOTE — Progress Notes (Signed)
Post-op medications sent   Patient states he has done well with vicodin. Percocet causes nausea

## 2021-01-12 NOTE — Progress Notes (Signed)
[facility]: s/p right 1st MTPJ arthrodesis  Arthritis noted along 1st MTPJ,   During the surgery a k-wire went through the foot and into my hand. We followed protocol and drew the patients and my own blood. I informed the patients wife post-op and she was also gave consent to send blood.

## 2021-01-13 ENCOUNTER — Telehealth: Payer: Self-pay | Admitting: Podiatry

## 2021-01-13 NOTE — Telephone Encounter (Signed)
I called the patient. He is taking Vicodin every 8 hours. Recommend taking it 1 every 4 hours as needed. Encouraged to loosen the ACE bandage, ice/elevate. If no improvement to let us know.

## 2021-01-13 NOTE — Telephone Encounter (Signed)
I spoke with Dr.wagoner and he wanted me to call [name] back to ask him if taking the oxycodone was ok. [name] stated that taking the oxycodone is ok but he would still like something stronger to take to relieve his pain.

## 2021-01-13 NOTE — Telephone Encounter (Signed)
Patient called our office today stating that he had surgery 01/12/21 he would like to be prescribed something stronger other than what was prescribed due to the pain.

## 2021-01-17 ENCOUNTER — Other Ambulatory Visit: Payer: Self-pay | Admitting: Podiatry

## 2021-01-17 ENCOUNTER — Encounter: Payer: 59 | Admitting: Podiatry

## 2021-01-17 ENCOUNTER — Telehealth: Payer: Self-pay | Admitting: Podiatry

## 2021-01-17 DIAGNOSIS — M21619 Bunion of unspecified foot: Secondary | ICD-10-CM

## 2021-01-17 MED ORDER — HYDROCODONE-ACETAMINOPHEN 10-325 MG PO TABS: 1.0000 | ORAL_TABLET | Freq: Three times a day (TID) | ORAL | 0 refills | Status: DC | PRN

## 2021-01-17 NOTE — Telephone Encounter (Signed)
I have sent the pain medication.   Shelly- I put in an order for a knee scooter. Can you please follow up on this? Thanks.

## 2021-01-17 NOTE — Telephone Encounter (Signed)
Patient would like a refill on pain medication.(uses [employer])  He also said that he would like to speak with you because he has a few questions to ask you.

## 2021-01-18 ENCOUNTER — Telehealth: Payer: Self-pay

## 2021-01-18 NOTE — Telephone Encounter (Signed)
Ordered knee scooter per Dr. Leigh Aurora request. Patient notified that someone from [employer] will be in contact with him about the scooter.

## 2021-01-19 ENCOUNTER — Telehealth: Payer: Self-pay | Admitting: *Deleted

## 2021-01-19 NOTE — Telephone Encounter (Signed)
Patient is calling for the status on his scooter, stated that someone was suppose to contact him 1 day ago. Return call to patient and gave information for [phone]), explained to him that the scooter was available, they had already spoken with his wife and he can pick up from retail store,verbalized understanding.

## 2021-01-20 ENCOUNTER — Telehealth: Payer: Self-pay | Admitting: Podiatry

## 2021-01-20 ENCOUNTER — Other Ambulatory Visit: Payer: Self-pay | Admitting: Podiatry

## 2021-01-20 ENCOUNTER — Other Ambulatory Visit: Payer: Self-pay

## 2021-01-20 ENCOUNTER — Ambulatory Visit (INDEPENDENT_AMBULATORY_CARE_PROVIDER_SITE_OTHER): Payer: 59

## 2021-01-20 ENCOUNTER — Ambulatory Visit (INDEPENDENT_AMBULATORY_CARE_PROVIDER_SITE_OTHER): Payer: 59 | Admitting: Podiatry

## 2021-01-20 DIAGNOSIS — Z9889 Other specified postprocedural states: Secondary | ICD-10-CM

## 2021-01-20 DIAGNOSIS — M21611 Bunion of right foot: Secondary | ICD-10-CM

## 2021-01-20 DIAGNOSIS — L84 Corns and callosities: Secondary | ICD-10-CM

## 2021-01-20 NOTE — Telephone Encounter (Signed)
Pt requested refill on pain meds. He states he will be out of them over the weekend. Please advise.

## 2021-01-21 MED ORDER — HYDROCODONE-ACETAMINOPHEN 10-325 MG PO TABS: 1.0000 | ORAL_TABLET | Freq: Three times a day (TID) | ORAL | 0 refills | Status: AC | PRN

## 2021-01-21 NOTE — Telephone Encounter (Signed)
sent 

## 2021-01-25 NOTE — Progress Notes (Signed)
Subjective: [name] is a 70 y.o. is seen today in office s/p right foot first MPJ arthrodesis with second metatarsal osteotomy and hammertoe repair preformed on 01/12/2021.  Overall states his pain is improving.  He still in the cam boot, nonweightbearing.  Denies any systemic complaints such as fevers, chills, nausea, vomiting. No calf pain, chest pain, shortness of breath.   Objective: General: No acute distress, AAOx3  DP/PT pulses palpable 2/4, CRT < 3 sec to all digits.  Protective sensation intact. Motor function intact.  RIGHT foot: Incision is well coapted without any evidence of dehiscence and sutures are intact. There is no surrounding erythema, ascending cellulitis, fluctuance, crepitus, malodor, drainage/purulence. There is mild edema around the surgical site. There is minimal pain along the surgical site.  Toes are in rectus position No other areas of tenderness to bilateral lower extremities.  No other open lesions or pre-ulcerative lesions.  No pain with calf compression, swelling, warmth, erythema.   Assessment and Plan:  Status post right foot surgery, doing well with no complications   -Treatment options discussed including all alternatives, risks, and complications -X-rays obtained reviewed.  No evidence of acute fracture.  Hardware intact with postsurgery without any complicating factors. -Antibiotic ointment and dressing applied.  Keep dressing clean, dry, intact. -Continue cam boot, nonweightbearing -Ice/elevation -Pain medication as needed. -Monitor for any clinical signs or symptoms of infection and DVT/PE and directed to call the office immediately should any occur or go to the ER. -Follow-up as scheduled or sooner if any problems arise. In the meantime, encouraged to call the office with any questions, concerns, change in symptoms.   Celesta Gentile, DPM

## 2021-01-27 ENCOUNTER — Ambulatory Visit (INDEPENDENT_AMBULATORY_CARE_PROVIDER_SITE_OTHER): Payer: 59 | Admitting: Podiatry

## 2021-01-27 ENCOUNTER — Other Ambulatory Visit: Payer: Self-pay

## 2021-01-27 DIAGNOSIS — M21611 Bunion of right foot: Secondary | ICD-10-CM

## 2021-01-27 DIAGNOSIS — Z9889 Other specified postprocedural states: Secondary | ICD-10-CM

## 2021-01-28 ENCOUNTER — Other Ambulatory Visit: Payer: Self-pay | Admitting: Podiatry

## 2021-01-28 ENCOUNTER — Telehealth: Payer: Self-pay | Admitting: Podiatry

## 2021-01-28 MED ORDER — HYDROCODONE-ACETAMINOPHEN 10-325 MG PO TABS: 1.0000 | ORAL_TABLET | Freq: Four times a day (QID) | ORAL | 0 refills | Status: AC | PRN

## 2021-01-28 NOTE — Telephone Encounter (Signed)
Pt is requesting refill on pain meds. He states he had a rough night and morning. Please advise.

## 2021-01-28 NOTE — Telephone Encounter (Signed)
sent 

## 2021-01-29 NOTE — Progress Notes (Signed)
Subjective: [name] is a 70 y.o. is seen today in office s/p right foot first MPJ arthrodesis preformed on 01/12/2021.  Overall states his pain is improving and taking pain medication mostly at night. He is eager to start walking. He has remained in the CAM boot and has a rolling knee scooter. Denies any systemic complaints such as fevers, chills, nausea, vomiting. No calf pain, chest pain, shortness of breath.   Objective: General: No acute distress, AAOx3  DP/PT pulses palpable 2/4, CRT < 3 sec to all digits.  Protective sensation intact. Motor function intact.  RIGHT foot: Incision is well coapted without any evidence of dehiscence and sutures are intact. There is no surrounding erythema, ascending cellulitis, fluctuance, crepitus, malodor, drainage/purulence. There is improved edema around the surgical site. There is slight pain along the surgical site today.  Toes are in rectus position No other areas of tenderness to bilateral lower extremities.  No other open lesions or pre-ulcerative lesions.  No pain with calf compression, swelling, warmth, erythema.   Assessment and Plan:  Status post right foot surgery, doing well with no complications   -Treatment options discussed including all alternatives, risks, and complications -At this time he can start to wash the foot with soap and water and dry thoroughly.  Apply a small amount of antibiotic ointment and dressing daily. -Continue nonweightbearing cam boot. -Continue to ice and elevate. -Pain medication as needed. -Monitor for any clinical signs or symptoms of infection and DVT/PE and directed to call the office immediately should any occur or go to the ER. -Follow-up as scheduled or sooner if any problems arise. In the meantime, encouraged to call the office with any questions, concerns, change in symptoms.   *Repeat x-rays next appointment right foot  Celesta Gentile, DPM

## 2021-02-03 ENCOUNTER — Telehealth: Payer: Self-pay | Admitting: *Deleted

## 2021-02-03 ENCOUNTER — Other Ambulatory Visit: Payer: Self-pay | Admitting: Podiatry

## 2021-02-03 MED ORDER — HYDROCODONE-ACETAMINOPHEN 5-325 MG PO TABS: 1.0000 | ORAL_TABLET | ORAL | 0 refills | Status: DC | PRN

## 2021-02-03 NOTE — Telephone Encounter (Signed)
I have sent the hydrocodone to the pharmacy but I did decrease it to 5/325 from 10/325 in hopes we can start to wean off of the pain medication some. Thanks!

## 2021-02-03 NOTE — Telephone Encounter (Signed)
Patient is calling to request a pain medicine(hydrocodone-ace 10-325 mg) refill, has an upcoming 02/10/21. Please advise.

## 2021-02-04 NOTE — Telephone Encounter (Signed)
Returned call to patient and gave information per Dr Leigh Aurora recommendations,verbalized understanding and said that he will try and see how the new prescription will work.

## 2021-02-08 ENCOUNTER — Other Ambulatory Visit: Payer: Self-pay | Admitting: Podiatry

## 2021-02-08 ENCOUNTER — Telehealth: Payer: Self-pay | Admitting: *Deleted

## 2021-02-08 MED ORDER — HYDROCODONE-ACETAMINOPHEN 5-325 MG PO TABS: 1.0000 | ORAL_TABLET | Freq: Four times a day (QID) | ORAL | 0 refills | Status: DC | PRN

## 2021-02-08 NOTE — Telephone Encounter (Signed)
sent 

## 2021-02-08 NOTE — Telephone Encounter (Signed)
Patient is calling to request a refill of pain medicine. His  pain is getting better but needs something to last until upcoming appointment(02/10/21).Please advise.

## 2021-02-10 ENCOUNTER — Ambulatory Visit (INDEPENDENT_AMBULATORY_CARE_PROVIDER_SITE_OTHER): Payer: 59 | Admitting: Podiatry

## 2021-02-10 ENCOUNTER — Other Ambulatory Visit: Payer: Self-pay

## 2021-02-10 ENCOUNTER — Ambulatory Visit (INDEPENDENT_AMBULATORY_CARE_PROVIDER_SITE_OTHER): Payer: 59

## 2021-02-10 DIAGNOSIS — Z9889 Other specified postprocedural states: Secondary | ICD-10-CM

## 2021-02-10 DIAGNOSIS — M21611 Bunion of right foot: Secondary | ICD-10-CM

## 2021-02-12 ENCOUNTER — Other Ambulatory Visit: Payer: Self-pay | Admitting: Podiatry

## 2021-02-13 NOTE — Progress Notes (Signed)
Subjective: [name] is a 70 y.o. is seen today in office s/p right foot first MPJ arthrodesis preformed on 01/12/2021.  States he has been doing well.  The dog did step on the foot and cause little bit of bleeding but since then the incision has been closed without any drainage or pus or any swelling or bleeding.  Still takes pain medicine but try to decrease the amount he is taking. Denies any systemic complaints such as fevers, chills, nausea, vomiting. No calf pain, chest pain, shortness of breath.   Objective: General: No acute distress, AAOx3  DP/PT pulses palpable 2/4, CRT < 3 sec to all digits.  Protective sensation intact. Motor function intact.  RIGHT foot: Incision is well coapted without any evidence of dehiscence. There is no surrounding erythema, ascending cellulitis, fluctuance, crepitus, malodor, drainage/purulence. There is mild however improved edema around the surgical site. There is minimal pain along the surgical site today.  Toes are in rectus position No other areas of tenderness to bilateral lower extremities.  No other open lesions or pre-ulcerative lesions.  No pain with calf compression, swelling, warmth, erythema.   Assessment and Plan:  Status post right foot surgery, doing well with no complications   -Treatment options discussed including all alternatives, risks, and complications -Repeat x-rays obtained reviewed.  Hardware intact without any complicating factors.  Increase consolidation across the arthrodesis site. -Discussed may consider transition to partial weightbearing as tolerated in the cam boot.  Discussed ice and elevate still.  He is asking about lifting objects and he recommended to hold off on this. -Continue to ice and elevate. -Pain medication as needed. -Monitor for any clinical signs or symptoms of infection and DVT/PE and directed to call the office immediately should any occur or go to the ER. -Follow-up as scheduled or sooner if any  problems arise. In the meantime, encouraged to call the office with any questions, concerns, change in symptoms.   *Repeat x-rays next appointment right foot  Celesta Gentile, DPM

## 2021-02-14 MED ORDER — HYDROCODONE-ACETAMINOPHEN 5-325 MG PO TABS: 1.0000 | ORAL_TABLET | Freq: Four times a day (QID) | ORAL | 0 refills | Status: DC | PRN

## 2021-02-15 ENCOUNTER — Telehealth: Payer: Self-pay | Admitting: *Deleted

## 2021-02-15 NOTE — Telephone Encounter (Signed)
Patient is calling to request a refill of his pain medicine(hydrocodone),still having pain with pressure on the foot,especially in the mornings, tylenol is not helping. Please advise.

## 2021-02-15 NOTE — Telephone Encounter (Signed)
It was sent yesterday, please let him know.

## 2021-02-15 NOTE — Telephone Encounter (Signed)
Called and spoke with the patient and relayed the message per Dr Jacqualyn Posey. Lattie Haw

## 2021-02-21 ENCOUNTER — Other Ambulatory Visit: Payer: Self-pay | Admitting: Podiatry

## 2021-02-21 ENCOUNTER — Telehealth: Payer: Self-pay | Admitting: *Deleted

## 2021-02-21 MED ORDER — HYDROCODONE-ACETAMINOPHEN 5-325 MG PO TABS: 1.0000 | ORAL_TABLET | Freq: Four times a day (QID) | ORAL | 0 refills | Status: DC | PRN

## 2021-02-21 NOTE — Telephone Encounter (Signed)
sent 

## 2021-02-21 NOTE — Telephone Encounter (Signed)
Patient is calling to request a refill of (hydrocodone-ace, 5-325 mg). His toe and heel are really throbbing/hurting, tylenol, aleve not helping.Please advise.

## 2021-02-22 NOTE — Telephone Encounter (Signed)
Returned call to patient and informed that medication has been sent.

## 2021-02-24 ENCOUNTER — Ambulatory Visit (INDEPENDENT_AMBULATORY_CARE_PROVIDER_SITE_OTHER): Payer: 59

## 2021-02-24 ENCOUNTER — Ambulatory Visit (INDEPENDENT_AMBULATORY_CARE_PROVIDER_SITE_OTHER): Payer: 59 | Admitting: Podiatry

## 2021-02-24 ENCOUNTER — Encounter: Payer: Self-pay | Admitting: Podiatry

## 2021-02-24 ENCOUNTER — Other Ambulatory Visit: Payer: Self-pay

## 2021-02-24 DIAGNOSIS — M21611 Bunion of right foot: Secondary | ICD-10-CM | POA: Diagnosis not present

## 2021-02-24 DIAGNOSIS — Z9889 Other specified postprocedural states: Secondary | ICD-10-CM

## 2021-02-27 NOTE — Progress Notes (Signed)
Subjective: [name] is a 70 y.o. is seen today in office s/p right foot first MPJ arthrodesis preformed on 01/12/2021.  States he has been continuing to do well in the he has noticed improvement.  Pain is decreasing at the days ago he reports.  Some some swelling but denies any other issues. Denies any systemic complaints such as fevers, chills, nausea, vomiting. No calf pain, chest pain, shortness of breath.   Objective: General: No acute distress, AAOx3  DP/PT pulses palpable 2/4, CRT < 3 sec to all digits.  Protective sensation intact. Motor function intact.  RIGHT foot: Incision is well coapted without any evidence of dehiscence scar is well formed.  Arthrodesis site appears to be stable.  There is mild edema but there is no erythema or warmth.  The edema appears to be improved.  No significant tenderness to palpation along the surgical site.  No other areas of discomfort identified.  MMT 5/5.   No other open lesions or pre-ulcerative lesions.  No pain with calf compression, swelling, warmth, erythema.   Assessment and Plan:  Status post right foot surgery, doing well with no complications   -Treatment options discussed including all alternatives, risks, and complications -Repeat x-rays obtained reviewed.  Hardware intact without any complicating factors.  Increased consolidation across the arthrodesis site. -At this point discussed with him he can start to gradually transition to regular shoe as tolerated we will go ahead and start physical therapy.  Referral for physical therapy was placed today.  Also surgical shoes he can transition to before going back into regular shoe.  Continue ice elevate as well as compression anklet or Ace bandage to help with any edema.  Continue ice elevate as well.  Return in about 4 weeks (around 03/24/2021).  Repeat foot x-rays  Trula Slade DPM

## 2021-03-02 ENCOUNTER — Other Ambulatory Visit: Payer: Self-pay | Admitting: Podiatry

## 2021-03-02 ENCOUNTER — Telehealth: Payer: Self-pay | Admitting: *Deleted

## 2021-03-02 MED ORDER — TRAMADOL HCL 50 MG PO TABS: 50.0000 mg | ORAL_TABLET | Freq: Three times a day (TID) | ORAL | 0 refills | Status: AC | PRN

## 2021-03-02 NOTE — Telephone Encounter (Signed)
Returned call to patient and gave instructions and to informed that a new prescription has been sent to his pharmacy,he verbalized understanding and said thank you!

## 2021-03-02 NOTE — Telephone Encounter (Signed)
Patient is calling because he is still having ankle pain and swelling, is doing everything instructed at last office visit but the aleve and ibuprofen is not helping. He is  walking about 1-2 blocks daily with his regular shoes,wants to know if there is anything else the doctor can prescribe to help. Please advise.

## 2021-03-14 ENCOUNTER — Telehealth: Payer: Self-pay | Admitting: *Deleted

## 2021-03-14 ENCOUNTER — Other Ambulatory Visit: Payer: Self-pay | Admitting: Podiatry

## 2021-03-14 MED ORDER — TRAMADOL HCL 50 MG PO TABS: 50.0000 mg | ORAL_TABLET | Freq: Four times a day (QID) | ORAL | 0 refills | Status: AC | PRN

## 2021-03-14 NOTE — Telephone Encounter (Signed)
Patient is requesting a refill of Tramadol-50 mg tablets. Please advise, still having ankle pain and swelling.

## 2021-03-14 NOTE — Telephone Encounter (Signed)
Tramadol sent to pharmacy. - Dr. Amalia Hailey

## 2021-03-14 NOTE — Progress Notes (Signed)
PRN pain 

## 2021-03-16 ENCOUNTER — Other Ambulatory Visit: Payer: Self-pay | Admitting: Family Medicine

## 2021-03-28 ENCOUNTER — Ambulatory Visit (INDEPENDENT_AMBULATORY_CARE_PROVIDER_SITE_OTHER): Payer: 59

## 2021-03-28 ENCOUNTER — Other Ambulatory Visit: Payer: Self-pay

## 2021-03-28 ENCOUNTER — Ambulatory Visit (INDEPENDENT_AMBULATORY_CARE_PROVIDER_SITE_OTHER): Payer: 59 | Admitting: Podiatry

## 2021-03-28 DIAGNOSIS — Z9889 Other specified postprocedural states: Secondary | ICD-10-CM

## 2021-03-28 DIAGNOSIS — M778 Other enthesopathies, not elsewhere classified: Secondary | ICD-10-CM | POA: Diagnosis not present

## 2021-03-28 DIAGNOSIS — M21611 Bunion of right foot: Secondary | ICD-10-CM

## 2021-03-28 MED ORDER — TRIAMCINOLONE ACETONIDE 10 MG/ML IJ SUSP
10.0000 mg | Freq: Once | INTRAMUSCULAR | Status: AC
  Administered 2021-03-28: 10 mg

## 2021-03-28 MED ORDER — MELOXICAM 15 MG PO TABS
15.0000 mg | ORAL_TABLET | Freq: Every day | ORAL | 0 refills | Status: DC
Start: 2021-03-28 — End: 2021-05-03

## 2021-03-31 NOTE — Progress Notes (Signed)
Subjective: [name] is a 70 y.o. is seen today in office s/p right foot first MPJ arthrodesis preformed on 01/12/2021.  He states nobody had called for physical therapy.  Referral was placed on June 9 for [facility].  He states making some discomfort of the ankle.  No injury he reports missing some swelling.  Spectamine regular shoe gear. Denies any systemic complaints such as fevers, chills, nausea, vomiting. No calf pain, chest pain, shortness of breath.   Objective: General: No acute distress, AAOx3  DP/PT pulses palpable 2/4, CRT < 3 sec to all digits.  Protective sensation intact. Motor function intact.  RIGHT foot: Incision is well coapted without any evidence of dehiscence scar is well formed.  Arthrodesis site appears to be stable.  There is mild edema but there is no erythema or warmth to the surgical site.  The majority tenderness today is actually more to the lateral aspect along the sinus tarsi there is edema present in this area.  There is no erythema or warmth.  No significant tenderness to the ankle joint itself.  No pain with tibia-fibula.   MMT 5/5.   No other open lesions or pre-ulcerative lesions.  No pain with calf compression, swelling, warmth, erythema.   Assessment and Plan:  Status post right foot surgery, doing well with no complications   -Treatment options discussed including all alternatives, risks, and complications -Repeat x-rays obtained reviewed.  Hardware intact without any complicating factors.  Increased consolidation across the arthrodesis site. -Majority of his tenderness is along the sinus tarsi.  Steroid injection was performed today.  Skin was prepped with Betadine, alcohol and mixture of 1 cc Kenalog 10, 1 cc lidocaine plain was infiltrated into the sinus tarsi without complications.  Postinjection care discussed.  Tolerated procedure well. -New referral to physical therapy placed. He is weight-bear as tolerated currently in regular shoe. -We will  plan to keep him out of work for additional 1 month until he starts physical therapy and continues to improve and ambulation and pain.    Return in about 3 weeks (around 04/18/2021).  Trula Slade DPM

## 2021-04-04 ENCOUNTER — Telehealth (INDEPENDENT_AMBULATORY_CARE_PROVIDER_SITE_OTHER): Payer: 59 | Admitting: Family Medicine

## 2021-04-04 VITALS — Temp 98.2°F

## 2021-04-04 DIAGNOSIS — U071 COVID-19: Secondary | ICD-10-CM

## 2021-04-04 MED ORDER — NIRMATRELVIR/RITONAVIR (PAXLOVID)TABLET: 3.0000 | ORAL_TABLET | Freq: Two times a day (BID) | ORAL | 0 refills | Status: AC

## 2021-04-04 NOTE — Progress Notes (Signed)
Virtual Visit via Telephone Note Unable to use video with network difficulties.  I connected with [name] on 04/04/21 at 9:30 AM by telephone and verified that I am speaking with the correct person using two identifiers. Patient location: home My location: office - [place]   I discussed the limitations, risks, security and privacy concerns of performing an evaluation and management service by telephone and the availability of in person appointments. I also discussed with the patient that there may be a patient responsible charge related to this service. The patient expressed understanding and agreed to proceed, consent obtained  Chief complaint: Chief Complaint  Patient presents with   Covid Positive    Pt reports he tested positive yesterday, sxs starting Saturday body aches, weakness, fatigue, chest congestion, denies fever or chills. Pt has had a headache      History of Present Illness:  Covid 19 infection: Backache, headache, some dyspnea, congestion started 7/16.  Postive covid test yesterday and today.  Sick contact with Covid recently - wife.   Min dyspnea with activity, not at rest, no chest pains. No fever, drinking fluids. No confusion.  Tx: mucinex. Alleve, acetaminophen.   Covid vaccine:2/21, 3/21, booster x1 Covid risk of complication score: 5  On CCB- norvasc 7.28m qd.  On statin - pravastatin 240mqd. Last dose this am. Advised to hold.  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   Pulmonary nodules 02/23/2014   Tobacco use disorder, moderate, in  sustained remission 02/23/2014   Recurrent right inguinal hernia 10/20/2013   ED (erectile dysfunction) 08/26/2012   Physical exam, annual 11/05/2011   HTN (hypertension) 11/05/2011   GERD (gastroesophageal reflux disease) 11/05/2011   Chronic joint pain 11/05/2011   Past Medical History:  Diagnosis Date   Allergy    seasonal   Arthritis    oa   Asthma    COPD (chronic obstructive pulmonary disease) ([place]   GERD (gastroesophageal reflux disease)    Hypertension    Prostate cancer ([facility]/2017   prostatectomy    Shortness of breath dyspnea    Past Surgical History:  Procedure Laterality Date   HERNIA REPAIR  last done 3-4 yrs ago   x 2   INGUINAL HERNIA REPAIR Right 11/20/2013   Procedure: OPEN REPAIR OF RECURRENT RIGHT INGUINAL HERNIA WITH MESH;  Surgeon: MaImogene BurnTsGeorgette DoverMD;  [facility];  Service: General;  Laterality: Right;   INSERTION OF MESH Right 11/20/2013   Procedure: INSERTION OF MESH;  Surgeon: MaImogene BurnTsGeorgette DoverMD;  [facility];  Service: General;  Laterality: Right;   KNEE SURGERY Left july 2014   mcl and meniscus repair   LYMPHADENECTOMY Bilateral 02/09/2016   Procedure: PELVIC LYMPHADENECTOMY;  Surgeon: ThAlexis FrockMD;  [facility];  Service: Urology;  Laterality: Bilateral;   right knee arthroscopy      ROBOT ASSISTED LAPAROSCOPIC RADICAL PROSTATECTOMY N/A 02/09/2016   Procedure: XI ROBOTIC ASSISTED LAPAROSCOPIC RADICAL PROSTATECTOMY WITH INDOCYANINE GREEN DYE AND ADHESIOLYSIS;  Surgeon: ThAlexis FrockMD;  [facility];  Service: Urology;  Laterality: N/A;   TOTAL KNEE ARTHROPLASTY Right 09/06/2015   Procedure: RIGHT TOTAL KNEE ARTHROPLASTY;  Surgeon: [name], MD;  [facility];  Service: Orthopedics;  Laterality: Right;   TOTAL KNEE ARTHROPLASTY Left 06/17/2019   Procedure: TOTAL KNEE ARTHROPLASTY;  Surgeon: [name], MD;  [facility];  Service: Orthopedics;  Laterality: Left;  70 mins   Allergies  Allergen Reactions    Percocet [Oxycodone-Acetaminophen] Other (See Comments)    Nausea-Doesn't work   Amoxicillin Rash    Has patient had a PCN reaction causing immediate rash, facial/tongue/throat swelling, SOB or lightheadedness with hypotension: No Has patient had a PCN reaction causing severe rash involving mucus membranes or skin necrosis: No Has patient had a PCN reaction that required hospitalization No Has patient had a PCN reaction occurring within the last 10 years: Yes- 2016 If all of the above answers are "NO", then may proceed with Cephalosporin use.    Prior to Admission medications   Medication Sig Start Date End Date Taking? Authorizing Provider  albuterol (PROVENTIL HFA) 108 (90 Base) MCG/ACT inhaler Proventil HFA 90 mcg/actuation aerosol inhaler   2 puffs by inhalation route every 4-6 hours as needed. 09/15/20  Yes Collene Gobble, MD  amLODipine (NORVASC) 2.5 MG tablet Take 1 tablet (2.5 mg total) by mouth daily. 01/10/21  Yes [name], MD  amLODipine (NORVASC) 5 MG tablet Take 1 tablet (5 mg total) by mouth daily. Combine with 2.11m for total dose 7.567mqd. 01/10/21  Yes GrWendie AgresteMD  ANORO ELLIPTA 62.5-25 MCG/INH AEPB INHALE ONE PUFF BY MOUTH INTO THE LUNGS ONE TIME DAILY 10/20/20  Yes ByCollene GobbleMD  azelastine (OPTIVAR) 0.05 % ophthalmic solution Place 1 drop into both eyes 2 (two) times daily as needed. For itching/allergy symptoms 07/14/19  Yes GrWendie AgresteMD  celecoxib (CELEBREX) 200 MG capsule Take 1 capsule (200 mg total) by mouth 2 (two) times daily. 06/18/19  Yes DoIrving CopasPA-C  Cholecalciferol (VITAMIN D3) 50 MCG (2000 UT) TABS Take 2,000 Units by mouth daily.   Yes [provider]  clindamycin (CLEOCIN) 300 MG capsule Take 1 capsule (300 mg total) by mouth 3 (three) times daily. 01/12/21  Yes WaTrula SladeDPM  clobetasol cream (TEMOVATE) 0.05 % APPLY TOPICALLY TO AFFECTED AREA(S) NIGHTLY AT BEDTIME 08/30/20  Yes TaLavonna MonarchMD   cyclobenzaprine (FLEXERIL) 5 MG tablet take one tablet by mouth three times daily as needed for muscle spasm. start at bedtime due to sedation 01/12/20  Yes GrWendie AgresteMD  FL[place]leyOVID-19 AG[place]EST KIT  11/09/20  Yes [provider]  fluticasone (KLS ALLER-FLO) 50 MCG/ACT nasal spray Place 2 sprays into both nostrils daily. 01/10/21  Yes GrWendie AgresteMD  HYDROcodone-acetaminophen (NORCO/VICODIN) 5-325 MG tablet Take 1 tablet by mouth every 6 (six) hours as needed. 02/21/21  Yes WaTrula SladeDPM  INCRUSE ELLIPTA 62.5 MCG/INH AEPB INHALE ONE PUFF BY MOUTH INTO THE LUNGS ONCE DAILY 02/24/20  Yes ByCollene GobbleMD  KLS ESOMEPRAZOLE MAGNESIUM 20 MG capsule TAKE TWO CAPSULES BY MOUTH DAILY 03/16/21  Yes GrWendie AgresteMD  meloxicam (MOBIC) 15 MG tablet Take 1 tablet (15 mg total) by mouth daily. 03/28/21 03/28/22 Yes WaTrula SladeDPM  Menthol-Methyl Salicylate (SALONPAS PAIN RELIEF PATCH EX) Place 1 patch onto the skin daily as needed (pain.).   Yes [provider]  methocarbamol (ROBAXIN) 500 MG tablet Take 1  tablet (500 mg total) by mouth every 6 (six) hours as needed for muscle spasms. 06/18/19  Yes Irving Copas, PA-C  pravastatin (PRAVACHOL) 20 MG tablet Take 1 tablet (20 mg total) by mouth daily. 01/10/21  Yes [name], MD  promethazine (PHENERGAN) 25 MG tablet Take 1 tablet (25 mg total) by mouth every 8 (eight) hours as needed for nausea or vomiting. 01/12/21  Yes Trula Slade, DPM  triamcinolone cream (KENALOG) 0.1 % Apply 1 application topically 2 (two) times daily as needed. To affected areas only as needed. Patient taking differently: Apply 1 application topically 2 (two) times daily as needed (rash). 01/08/19  Yes [name], MD   Social History   Socioeconomic History   Marital status: Married    Spouse name: Not on file   Number of children: Not on file   Years of education: Not on file   Highest education level: Not  on file  Occupational History   Not on file  Tobacco Use   Smoking status: Former    Packs/day: 1.50    Years: 20.00    Pack years: 30.00    Types: Cigarettes    Quit date: 11/05/1999    Years since quitting: 21.4   Smokeless tobacco: Never  Vaping Use   Vaping Use: Never used  Substance and Sexual Activity   Alcohol use: No    Comment: last used 25 years ago    Drug use: Yes    Types: Cocaine    Comment: last used cocaine  and acid 25 yrs ago,none used since   Sexual activity: Not on file  Other Topics Concern   Not on file  Social History Narrative   Not on file   Social Determinants of Health   Financial Resource Strain: Not on file  Food Insecurity: Not on file  Transportation Needs: Not on file  Physical Activity: Not on file  Stress: Not on file  Social Connections: Not on file  Intimate Partner Violence: Not on file   Observations/Objective: Vitals:   04/04/21 0908  Temp: 98.2 F (36.8 C)  Speaking in full sentences, no respiratory distress. Coherent responses. No distress.  All questions answered. Understanding of plan expressed.   Assessment and Plan: COVID-19 virus infection - Plan: nirmatrelvir/ritonavir EUA (PAXLOVID) TABS No red flags on current symptoms.  Start paxlovid - side effects discussed. Hold statin for 1 week. 1st dose paxlovid tonight as took statin this am. Decrease amlodinpine to 46m next 1 week.  Symptomatic care with tylenol, fluids, mucinex, rest. ER precautions given.   Follow Up Instructions:  As needed.  I discussed the assessment and treatment plan with the patient. The patient was provided an opportunity to ask questions and all were answered. The patient agreed with the plan and demonstrated an understanding of the instructions.   The patient was advised to call back or seek an in-person evaluation if the symptoms worsen or if the condition fails to improve as anticipated.  I provided 20 minutes of non-face-to-face time during  this encounter.  Signed,   JMerri Ray MD [facility]  04/04/21

## 2021-04-11 ENCOUNTER — Encounter: Payer: Self-pay | Admitting: Rehabilitative and Restorative Service Providers"

## 2021-04-11 ENCOUNTER — Ambulatory Visit: Payer: 59 | Attending: Podiatry | Admitting: Rehabilitative and Restorative Service Providers"

## 2021-04-11 ENCOUNTER — Other Ambulatory Visit: Payer: Self-pay

## 2021-04-11 DIAGNOSIS — R600 Localized edema: Secondary | ICD-10-CM | POA: Diagnosis present

## 2021-04-11 DIAGNOSIS — M25571 Pain in right ankle and joints of right foot: Secondary | ICD-10-CM | POA: Diagnosis present

## 2021-04-11 DIAGNOSIS — M6281 Muscle weakness (generalized): Secondary | ICD-10-CM | POA: Diagnosis present

## 2021-04-11 DIAGNOSIS — M25671 Stiffness of right ankle, not elsewhere classified: Secondary | ICD-10-CM | POA: Diagnosis present

## 2021-04-11 DIAGNOSIS — R262 Difficulty in walking, not elsewhere classified: Secondary | ICD-10-CM | POA: Diagnosis present

## 2021-04-11 NOTE — Patient Instructions (Signed)
Access Code: [email or web address] URL: [email or web address] Date: 04/11/2021 Prepared by: Shelby Dubin Kirbie Stodghill  Exercises Seated Ankle Alphabet - 2 x daily - 7 x weekly - 1 sets - 1 reps Seated Ankle Circles - 2 x daily - 7 x weekly - 1 sets - 10 reps Standing Heel Raise with Support - 2 x daily - 7 x weekly - 2 sets - 10 reps Toe Raises with Counter Support - 2 x daily - 7 x weekly - 2 sets - 10 reps Gastroc Stretch on Wall - 2 x daily - 7 x weekly - 1 sets - 5 reps - 20 sec hold

## 2021-04-11 NOTE — Therapy (Signed)
[facility] [place], [place], [address] Phone: [phone]   Fax:  [phone]  Physical Therapy Evaluation  Patient Details  Name: [name] MRN: [id number] Date of Birth: 1951/02/08 Referring Provider (PT): Dr Celesta Gentile   Encounter Date: 04/11/2021   PT End of Session - 04/11/21 1136     Visit Number 1    Date for PT Re-Evaluation 06/03/21    PT Start Time 1050    PT Stop Time 1130    PT Time Calculation (min) 40 min    Activity Tolerance Patient tolerated treatment well    Behavior During Therapy [facility] for tasks assessed/performed             Past Medical History:  Diagnosis Date   Allergy    seasonal   Arthritis    oa   Asthma    COPD (chronic obstructive pulmonary disease) ([place])    GERD (gastroesophageal reflux disease)    Hypertension    Prostate cancer ([place]) 01/2016   prostatectomy    Shortness of breath dyspnea     Past Surgical History:  Procedure Laterality Date   HERNIA REPAIR  last done 3-4 yrs ago   x 2   INGUINAL HERNIA REPAIR Right 11/20/2013   Procedure: OPEN REPAIR OF RECURRENT RIGHT INGUINAL HERNIA WITH MESH;  Surgeon: [name]. [name], MD;  [facility];  Service: General;  Laterality: Right;   INSERTION OF MESH Right 11/20/2013   Procedure: INSERTION OF MESH;  Surgeon: [name]. [name], MD;  [facility];  Service: General;  Laterality: Right;   KNEE SURGERY Left july 2014   mcl and meniscus repair   LYMPHADENECTOMY Bilateral 02/09/2016   Procedure: PELVIC LYMPHADENECTOMY;  Surgeon: [name], MD;  [facility];  Service: Urology;  Laterality: Bilateral;   right knee arthroscopy      ROBOT ASSISTED LAPAROSCOPIC RADICAL PROSTATECTOMY N/A 02/09/2016   Procedure: XI ROBOTIC ASSISTED LAPAROSCOPIC RADICAL PROSTATECTOMY WITH INDOCYANINE GREEN DYE AND ADHESIOLYSIS;  Surgeon: [name], MD;  [facility];  Service: Urology;  Laterality: N/A;   TOTAL  KNEE ARTHROPLASTY Right 09/06/2015   Procedure: RIGHT TOTAL KNEE ARTHROPLASTY;  Surgeon: [name], MD;  [facility];  Service: Orthopedics;  Laterality: Right;   TOTAL KNEE ARTHROPLASTY Left 06/17/2019   Procedure: TOTAL KNEE ARTHROPLASTY;  Surgeon: [name], MD;  [facility];  Service: Orthopedics;  Laterality: Left;  70 mins    There were no vitals filed for this visit.    Subjective Assessment - 04/11/21 1058     Subjective Pt had R foot first MPJ athrodesis on 01/12/21 and had his foot in a CAM walker to bear weight through his heel.  Pt had been having pain in R ankle and Dr Jacqualyn Posey gave him a steriod shot and ordered him to start PT.  Pt reports since the steroid shot, his foot and ankle are feeling better.    Pertinent History Covid+ on 04/03/21, R foot first MPJ arthrodesis    Patient Stated Goals To learn a HEP and get my foot/ankle to 100%.    Currently in Pain? No/denies                [facility] PT Assessment - 04/11/21 0001       Assessment   Medical Diagnosis Capsulitis of R foot M77.8, Bunion R M21.611    Referring Provider (PT) Dr Celesta Gentile    Onset Date/Surgical Date 01/12/21  Hand Dominance Right    Next MD Visit 04/19/2021    Prior Therapy no      Precautions   Precautions None      Restrictions   Weight Bearing Restrictions No      Balance Screen   Has the patient fallen in the past 6 months No    Has the patient had a decrease in activity level because of a fear of falling?  No    Is the patient reluctant to leave their home because of a fear of falling?  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, standing, scanning    Leisure Taking dogs for walks, go to [employer], parks, enjoys music      Cognition   Overall Cognitive Status Within Functional Limits for tasks assessed      Observation/Other Assessments   Focus on Therapeutic Outcomes (FOTO)  62%, Risk Adjusted 48%      ROM / Strength   AROM / PROM / Strength AROM;Strength      AROM   Overall AROM  Deficits    Overall AROM Comments Overall R ankle ROM limited 20%      Strength   Overall Strength Deficits    Overall Strength Comments R ankle strength of 4/5      Transfers   Five time sit to stand comments  14.91 sec                        Objective measurements completed on examination: See above findings.       [facility] Adult PT Treatment/Exercise - 04/11/21 0001       Exercises   Exercises Ankle      Ankle Exercises: Stretches   Gastroc Stretch 1 rep;20 seconds      Ankle Exercises: Standing   Heel Raises Both;10 reps;1 second    Toe Raise 10 reps;1 second      Ankle Exercises: Seated   ABC's 1 rep    Ankle Circles/Pumps AROM;Right;10 reps    Ankle Circles/Pumps Limitations x10 CW, and CCW                    PT Education - 04/11/21 1122     Education Details Pt provided with HEP    Person(s) Educated Patient    Methods Explanation;Demonstration;Handout    Comprehension Verbalized understanding;Returned demonstration              PT Short Term Goals - 04/11/21 1204       PT SHORT TERM GOAL #1   Title Pt will be independent with initial HEP.    Time 2    Period Weeks    Status New               PT Long Term Goals - 04/11/21 1205       PT LONG TERM GOAL #1   Title Pt will be independent with advanced HEP.    Time 8  Period Weeks    Status New      PT LONG TERM GOAL #2   Title Pt will have R ankle ROM WFL to allow him to perform work duties.    Time 8    Period Weeks    Status New       PT LONG TERM GOAL #3   Title Patient will increase R ankle/foot strength to at least 4+/5 to allow him to return to work and work a full shift.    Time 8    Period Weeks    Status New      PT LONG TERM GOAL #4   Title Patient will be able to perform RLE SLS for at least 20 seconds to decrease his risk of falling.    Time 8    Period Weeks    Status New                    Plan - 04/11/21 1138     Clinical Impression Statement Patient is a 71 y.o. male s/p right foot first MPJ arthrodesis on 01/12/21 referred by Dr Celesta Gentile with a diagnosis of Capsulitis of R foot and Bunion of R foot.  He had a steriod injection during his last MD follow up appointment and states that he is feeling much better since the injection.  Pt is hoping to return to [employer] to work again as soon as cleared by MD.  He presents with decreased ankle ROM, decreased ankle/foot strength, and decreased balance on R foot.  Mr Stigen would benefit from skilled PT to progress towards increased independent and ability to return to prior indpendent level of functioning and working.    Examination-Participation Restrictions Occupation    Stability/Clinical Decision Making Stable/Uncomplicated    Clinical Decision Making Low    Rehab Potential Good    PT Frequency --   1-2x/week   PT Duration 8 weeks    PT Treatment/Interventions ADLs/Self Care Home Management;Cryotherapy;Electrical Stimulation;Iontophoresis '4mg'$ /ml Dexamethasone;Moist Heat;Ultrasound;Gait training;Stair training;Functional mobility training;Therapeutic activities;Therapeutic exercise;Balance training;Neuromuscular re-education;Patient/family education;Passive range of motion;Scar mobilization;Dry needling;Taping;Joint Manipulations    PT Next Visit Plan review HEP, stretching, strengthening    PT Home Exercise Plan Access Code: [email or web address]             Patient will benefit from skilled therapeutic intervention in order to improve the  following deficits and impairments:  Decreased range of motion, Difficulty walking, Pain, Decreased balance, Decreased strength  Visit Diagnosis: Stiffness of right ankle, not elsewhere classified - Plan: PT plan of care cert/re-cert  Muscle weakness (generalized) - Plan: PT plan of care cert/re-cert  Pain in right ankle and joints of right foot - Plan: PT plan of care cert/re-cert  Difficulty in walking, not elsewhere classified - Plan: PT plan of care cert/re-cert  Localized edema - Plan: PT plan of care cert/re-cert     Problem List Patient Active Problem List   Diagnosis Date Noted   S/P left TKA 06/17/2019   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/17/2019   Pain in left knee 06/02/2019   Allergic rhinitis 03/12/2017   Thrush, oral 02/27/2017   SUI (stress urinary incontinence), male 03/20/2016   Prostate cancer ([place]) 02/09/2016   Obese 09/08/2015   S/P right TKA 09/06/2015   S/P knee replacement 09/06/2015   COPD, mild ([place]) 07/20/2014   Pulmonary nodules 02/23/2014   Tobacco use disorder, moderate, in sustained remission 02/23/2014   Recurrent right inguinal hernia 10/20/2013   ED (erectile  dysfunction) 08/26/2012   Physical exam, annual 11/05/2011   HTN (hypertension) 11/05/2011   GERD (gastroesophageal reflux disease) 11/05/2011   Chronic joint pain 11/05/2011    Juel Burrow, PT, DPT 04/11/2021, 12:28 PM  [facility] [place], [place], [address] Phone: [phone]   Fax:  [phone]  Name: [name] MRN: [id number] Date of Birth: Apr 15, 1951

## 2021-04-18 ENCOUNTER — Telehealth: Payer: Self-pay | Admitting: Family Medicine

## 2021-04-18 ENCOUNTER — Ambulatory Visit: Payer: 59 | Admitting: Physical Therapy

## 2021-04-18 NOTE — Telephone Encounter (Signed)
Please call [name] and let him know who long he should test positive for.  He is not having symptoms but still testing positive and he is concerned.  Please advise.

## 2021-04-19 ENCOUNTER — Ambulatory Visit (INDEPENDENT_AMBULATORY_CARE_PROVIDER_SITE_OTHER): Payer: 59 | Admitting: Podiatry

## 2021-04-19 ENCOUNTER — Encounter: Payer: Self-pay | Admitting: Podiatry

## 2021-04-19 ENCOUNTER — Other Ambulatory Visit: Payer: Self-pay

## 2021-04-19 DIAGNOSIS — K5732 Diverticulitis of large intestine without perforation or abscess without bleeding: Secondary | ICD-10-CM | POA: Insufficient documentation

## 2021-04-19 DIAGNOSIS — M21611 Bunion of right foot: Secondary | ICD-10-CM

## 2021-04-19 DIAGNOSIS — M722 Plantar fascial fibromatosis: Secondary | ICD-10-CM | POA: Diagnosis not present

## 2021-04-19 NOTE — Telephone Encounter (Signed)
Glad to hear he has improved.  If he had tested negative at some point after starting Paxlovid, then positive again, should restart his isolation/quarantine 5 to 10-day time from the new positive test.  Additionally if he is using a PCR test, that may stay positive for some time.  I suspect he is using an antigen test, and as long as more than 10 days since onset of infection, and he is afebrile and improved, then less likely contagious.

## 2021-04-19 NOTE — Telephone Encounter (Signed)
Called patient with provider recommendations. Patient voiced understanding. 

## 2021-04-21 ENCOUNTER — Telehealth: Payer: Self-pay | Admitting: Podiatry

## 2021-04-21 NOTE — Telephone Encounter (Signed)
Patient called 04/20/2021 for me to send 04/19/2021, office notes for Disability so he wont miss getting a paycheck.

## 2021-04-22 NOTE — Progress Notes (Signed)
Subjective: [name] is a 70 y.o. is seen today in office s/p right foot first MPJ arthrodesis preformed on 01/12/2021.  States he is doing well and he is not going to physical therapy as he feels he does not need it.  The injection we did previously was helpful.  He is back to wearing regular shoe gear.  No recent injury or changes otherwise.  Gets occasional discomfort at the surgical site but improving. Denies any systemic complaints such as fevers, chills, nausea, vomiting. No calf pain, chest pain, shortness of breath.   Objective: General: No acute distress, AAOx3  DP/PT pulses palpable 2/4, CRT < 3 sec to all digits.  Protective sensation intact. Motor function intact.  RIGHT foot: Incision is well coapted without any evidence of dehiscence scar is well formed.  Minimal edema present surgical site.  There is no significant pain elicited today.  No significant pain on the sinus tarsi today and there is no significant edema.  No erythema.  Also has some occasional discomfort plantar aspect calcaneus on insertion of the plantar fascia.  No pain along the posterior calcaneus.  No other areas of discomfort identified today. No other open lesions or pre-ulcerative lesions.  No pain with calf compression, swelling, warmth, erythema.   Assessment and Plan:  Status post right foot surgery, doing well with no complications   -Treatment options discussed including all alternatives, risks, and complications -Overall continues to improve.  He is measured for orthotics today.  He was to hold off on physical therapy and we discussed home stretching exercises to be performed daily.  Discussed with him gradually return to activity.  We will plan to return to work on August 24 at a time when he will return part-time working 5 hours a day for 3 weeks before going back full-time.   Return in about 3 weeks (around 05/10/2021).  Trula Slade DPM

## 2021-04-25 ENCOUNTER — Encounter: Payer: 59 | Admitting: Rehabilitative and Restorative Service Providers"

## 2021-05-02 ENCOUNTER — Other Ambulatory Visit: Payer: Self-pay | Admitting: Podiatry

## 2021-05-02 ENCOUNTER — Encounter: Payer: 59 | Admitting: Rehabilitative and Restorative Service Providers"

## 2021-05-09 ENCOUNTER — Encounter: Payer: Self-pay | Admitting: Podiatry

## 2021-05-09 ENCOUNTER — Other Ambulatory Visit: Payer: Self-pay | Admitting: Dermatology

## 2021-05-09 ENCOUNTER — Encounter: Payer: 59 | Admitting: Podiatry

## 2021-05-09 ENCOUNTER — Other Ambulatory Visit: Payer: Self-pay

## 2021-05-09 ENCOUNTER — Ambulatory Visit (INDEPENDENT_AMBULATORY_CARE_PROVIDER_SITE_OTHER): Payer: 59

## 2021-05-09 ENCOUNTER — Ambulatory Visit (INDEPENDENT_AMBULATORY_CARE_PROVIDER_SITE_OTHER): Payer: 59 | Admitting: Podiatry

## 2021-05-09 ENCOUNTER — Encounter: Payer: 59 | Admitting: Rehabilitative and Restorative Service Providers"

## 2021-05-09 DIAGNOSIS — M722 Plantar fascial fibromatosis: Secondary | ICD-10-CM

## 2021-05-09 DIAGNOSIS — M21611 Bunion of right foot: Secondary | ICD-10-CM

## 2021-05-09 NOTE — Patient Instructions (Signed)

## 2021-05-16 NOTE — Progress Notes (Signed)
Subjective: [name] is a 70 y.o. is seen today in office s/p right foot first MPJ arthrodesis preformed on 01/12/2021.  Presents to pick up orthotics.  He states that since a steroid injection to the sinus tarsi has not had any pain to his ankle.  Gets some discomfort in the bottom of the heel.  He is scheduled about work this week.  No recent injury or trauma any changes otherwise.   Objective: General: No acute distress, AAOx3  DP/PT pulses palpable 2/4, CRT < 3 sec to all digits.  Protective sensation intact. Motor function intact.  RIGHT foot: Incision is well coapted without any evidence of dehiscence scar is well formed.  There is no pain on the surgical site.  Arthrodesis site appears to be stable.  There is no pain in the sinus tarsi.  Does get some tenderness on the plantar aspect calcaneus insertion plantar fascial.  There is no pain with lateral compression of calcaneus.  No other areas of discomfort identified at this time. No other open lesions or pre-ulcerative lesions.  No pain with calf compression, swelling, warmth, erythema.   Assessment and Plan:  Status post right foot surgery, doing well with no complications   -Treatment options discussed including all alternatives, risks, and complications -Overall doing better.  Encouraged to continue home stretching, rehab exercises particular for plantar fasciitis.  Discussed supportive shoe gear.  Orthotics were dispensed today.  Oral and written break-in instructions were provided to the patient.  Patient scheduled for back to work with restrictions.  Trula Slade DPM

## 2021-05-28 ENCOUNTER — Other Ambulatory Visit: Payer: Self-pay | Admitting: Family Medicine

## 2021-05-28 DIAGNOSIS — I10 Essential (primary) hypertension: Secondary | ICD-10-CM

## 2021-06-22 ENCOUNTER — Encounter: Payer: Self-pay | Admitting: Family Medicine

## 2021-06-22 ENCOUNTER — Telehealth (INDEPENDENT_AMBULATORY_CARE_PROVIDER_SITE_OTHER): Payer: 59 | Admitting: Family Medicine

## 2021-06-22 ENCOUNTER — Telehealth: Payer: Self-pay | Admitting: Family Medicine

## 2021-06-22 ENCOUNTER — Other Ambulatory Visit: Payer: Self-pay

## 2021-06-22 ENCOUNTER — Other Ambulatory Visit: Payer: Self-pay | Admitting: Internal Medicine

## 2021-06-22 DIAGNOSIS — J029 Acute pharyngitis, unspecified: Secondary | ICD-10-CM | POA: Diagnosis not present

## 2021-06-22 DIAGNOSIS — B370 Candidal stomatitis: Secondary | ICD-10-CM

## 2021-06-22 DIAGNOSIS — J449 Chronic obstructive pulmonary disease, unspecified: Secondary | ICD-10-CM | POA: Diagnosis not present

## 2021-06-22 MED ORDER — FLUCONAZOLE 150 MG PO TABS: 150.0000 mg | ORAL_TABLET | Freq: Every day | ORAL | 0 refills | Status: DC

## 2021-06-22 NOTE — Patient Instructions (Signed)
Based on your symptoms I do think you have thrush, but sometimes that can also affect your throat and esophagus.  I have started you on antifungal Diflucan 2 pills initially then 1 pill/day for the next 2 weeks.  If any feeling of swelling or tightness in your throat, any difficulty swallowing secretions, persistent shortness of breath once you are back on the  [employer], or any other worsening symptoms be seen in the emergency room right away or call 911.  Repeat COVID test and let me know the results tomorrow night or Friday morning.  Recheck with me in 5 days, but again be seen if any worsening symptoms. Oral Thrush, Adult Oral thrush, also called oral candidiasis, is a fungal infection that develops in the mouth and throat and on the tongue. It causes white patches to form in the mouth and on the tongue. Many cases of thrush are mild, but this infection can also be serious. Ritta Slot can be a repeated (recurrent) problem for certain people who have a weak body defense system (immune system). The weakness can be caused by chronic illnesses, or by taking medicines that limit the body's ability to fight infection. If a person has difficulty fighting infection, the fungus that causes thrush can spread through the body. This can cause life-threatening blood or organ infections. What are the causes? This condition is caused by a fungus (yeast) called Candida albicans. This fungus is normally present in small amounts in the mouth and on other mucous membranes. It usually causes no harm. If conditions are present that allow the fungus to grow without control, it invades surrounding tissues and becomes an infection. Other Candida species can also lead to thrush, though this is rare. What increases the risk? The following factors may make you more likely to develop this condition: Having a weakened immune system. Being an older adult. Having diabetes, cancer, or HIV (human immunodeficiency virus). Having  dry mouth (xerostomia). Being pregnant or breastfeeding. Having poor dental care, especially in those who have dentures. Using antibiotic or steroid medicines. What are the signs or symptoms? Symptoms of this condition can vary from mild and moderate to severe and persistent. Symptoms may include: A burning feeling in the mouth and throat. This can occur at the start of a thrush infection. White patches that stick to the mouth and tongue. The tissue around the patches may be red, raw, and painful. If rubbed (during tooth brushing, for example), the patches and the tissue of the mouth may bleed easily. A bad taste in the mouth or difficulty tasting foods. A cottony feeling in the mouth. Pain during eating and swallowing. Poor appetite. Cracking at the corners of the mouth. How is this diagnosed? This condition is diagnosed based on: A physical exam. Your medical history. How is this treated? This condition is treated with medicines called antifungals, which prevent the growth of fungi. These medicines are either applied directly to the affected area (topical) or swallowed (oral). The treatment will depend on the severity of the condition. Mild cases of thrush may be treated with an antifungal mouth rinse or lozenges. Treatment usually lasts about 14 days. Moderate to severe cases of thrush can be treated with oral antifungal medicine, if they have spread to the esophagus. A topical antifungal medicine may also be used. For some severe infections, treatment may need to continue for more than 14 days. Oral antifungal medicines are rarely used during pregnancy because they may be harmful to the unborn child. If you  are pregnant, talk with your health care provider about options for treatment. Persistent or recurrent thrush. For cases of thrush that do not go away or keep coming back: Treatment may be needed twice as long as the symptoms last. Treatment will include both oral and topical  antifungal medicines. People with a weakened immune system can take an antifungal medicine on a continuous basis to prevent thrush infections. It is important to treat conditions that make a person more likely to get thrush, such as diabetes or HIV. Follow these instructions at home: Medicines Take or use over-the-counter and prescription medicines only as told by your health care provider. Talk with your health care provider about an over-the-counter medicine called gentian violet, which kills bacteria and fungi. Relieving soreness and discomfort To help reduce the discomfort of thrush: Drink cold liquids such as water or iced tea. Try flavored ice treats or frozen juices. Eat foods that are easy to swallow, such as gelatin, ice cream, or custard. Try drinking from a straw if the patches in your mouth are painful.  General instructions Eat plain, unflavored yogurt as directed by your health care provider. Check the label to make sure the yogurt contains live cultures. This yogurt can help healthy bacteria grow in the mouth and can stop the growth of the fungus that causes thrush. If you wear dentures, remove the dentures before going to bed, brush them vigorously, and soak them in a cleaning solution as directed by your health care provider. Rinse your mouth with a warm salt-water mixture several times a day. To make a salt-water mixture, dissolve -1 tsp (3-6 g) of salt in 1 cup (237 mL) of warm water. Contact a health care provider if: Your symptoms are getting worse or are not improving within 7 days of starting treatment. You have symptoms of a spreading infection, such as white patches on the skin outside of the mouth. You are breastfeeding your baby and you have redness and pain in the nipples. Summary Oral thrush, also called oral candidiasis, is a fungal infection that develops in the mouth and throat and on the tongue. It causes white patches to form in the mouth and on the  tongue. You are more likely to get this condition if you have a weakened immune system or an underlying condition, such as HIV, cancer, or diabetes. This condition is treated with medicines called antifungals, which prevent the growth of fungi. Contact a health care provider if your symptoms do not improve, or get worse, within 7 days of starting treatment. This information is not intended to replace advice given to you by your health care provider. Make sure you discuss any questions you have with your health care provider. Document Revised: 07/11/2019 Document Reviewed: 07/11/2019 Elsevier Patient Education  [employer].  Sore Throat A sore throat is pain, burning, irritation, or scratchiness in the throat. When you have a sore throat, you may feel pain or tenderness in your throat when you swallow or talk. Many things can cause a sore throat, including: An infection. Seasonal allergies. Dryness in the air. Irritants, such as smoke or pollution. Radiation treatment for cancer. Gastroesophageal reflux disease (GERD). A tumor. A sore throat is often the first sign of another sickness. It may happen with other symptoms, such as coughing, sneezing, fever, and swollen neck glands. Most sore throats go away without medical treatment. Follow these instructions at home:   Medicines Take over-the-counter and prescription medicines only as told by your health care provider.  Children often get sore throats. Do not give your child aspirin because of the association with Reye's syndrome. Use throat sprays to soothe your throat as told by your health care provider. Managing pain To help with pain, try: Sipping warm liquids, such as broth, herbal tea, or warm water. Eating or drinking cold or frozen liquids, such as frozen ice pops. Gargling with a mixture of salt and water 3-4 times a day or as needed. To make salt water, completely dissolve -1 tsp (3-6 g) of salt in 1 cup (237 mL) of warm  water. Sucking on hard candy or throat lozenges. Putting a cool-mist humidifier in your bedroom at night to moisten the air. Sitting in the bathroom with the door closed for 5-10 minutes while you run hot water in the shower. General instructions Do not use any products that contain nicotine or tobacco. These products include cigarettes, chewing tobacco, and vaping devices, such as e-cigarettes. If you need help quitting, ask your health care provider. Rest as needed. Drink enough fluid to keep your urine pale yellow. Wash your hands often with soap and water for at least 20 seconds. If soap and water are not available, use hand sanitizer. Contact a health care provider if: You have a fever for more than 2-3 days. You have symptoms that last for more than 2-3 days. Your throat does not get better within 7 days. You have a fever and your symptoms suddenly get worse. Get help right away if: You have difficulty breathing. You cannot swallow fluids, soft foods, or your saliva. You have increased swelling in your throat or neck. You have persistent nausea and vomiting. These symptoms may represent a serious problem that is an emergency. Do not wait to see if the symptoms will go away. Get medical help right away. Call your local emergency services (911 in the [place] Do not drive yourself to the hospital. Summary A sore throat is pain, burning, irritation, or scratchiness in the throat. Many things can cause a sore throat. Take over-the-counter medicines only as told by your health care provider. Rest as needed. Drink enough fluid to keep your urine pale yellow. Contact a health care provider if your throat does not get better within 7 days. This information is not intended to replace advice given to you by your health care provider. Make sure you discuss any questions you have with your health care provider. Document Revised: 12/01/2020 Document Reviewed: 12/01/2020 Elsevier Patient Education   [employer].

## 2021-06-22 NOTE — Telephone Encounter (Signed)
Patient appt is today at 4pm virtually for a sore throat. Patient believes he has thrush and explained his symptoms as burning and stinging of the tongue. Pt also states that the tongue is real red and white and feels choppy . Pt has been using warm water with salt and wants to know if he should continue doing so or should he be doing something else in th meantime until his appt. Please advise

## 2021-06-22 NOTE — Telephone Encounter (Signed)
Patient has an appointment today (virtually) for a sore throat - he believes he has thrush - Would like to know what he can use (salt water, etc) between now and time for his appointment to get some relief.  Please advise

## 2021-06-22 NOTE — Telephone Encounter (Signed)
See notes on visit.

## 2021-06-22 NOTE — Progress Notes (Signed)
Virtual Visit via Video Note  I connected with [name] on 06/22/21 at 4:04 PM by a video enabled telemedicine application and verified that I am speaking with the correct person using two identifiers.  Patient location: home with wife - permission granted to discuss PHI.  My location: office  [place].    I discussed the limitations, risks, security and privacy concerns of performing an evaluation and management service by telephone and the availability of in person appointments. I also discussed with the patient that there may be a patient responsible charge related to this service. The patient expressed understanding and agreed to proceed, consent obtained  Chief complaint:  Chief Complaint  Patient presents with   Sore Throat    Pt notes sore throat and raspy voice starting Sunday or Monday no congestion, slight cough, notes tougue swelling possible thrush, p notes burning and white coating in his mouth    History of Present Illness: [name] is a 70 y.o. male  Sore throat: started 2 days ago, sore throat, hoarseness, coating on tongue noted yesterday. Able to drink fluids, able to swallow secretions, but burns, sore to swallow feels like throat is swollen today. Tongue feels swollen, white patches worse from yesterday to today.  Some shortness of breath starting yesterday, more today.  No fever. Stopped Anoro yesterday because of concern of thrush. Able to swallow better than yesterday.  Throat is not as sore, swallowing about the same. Tongue feels worse.  Coughing some mucus.   No known sick contacts.  Negative covid test earlier today.   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   Pulmonary nodules 02/23/2014   Tobacco use disorder, moderate, in sustained remission 02/23/2014   Recurrent right inguinal hernia 10/20/2013   ED (erectile dysfunction) 08/26/2012   Physical exam, annual 11/05/2011   HTN (hypertension) 11/05/2011   GERD (gastroesophageal reflux disease) 11/05/2011   Chronic joint pain 11/05/2011   Past Medical History:  Diagnosis Date   Allergy    seasonal   Arthritis    oa   Asthma    COPD (chronic obstructive pulmonary disease) ([place])    GERD (gastroesophageal reflux disease)    Hypertension    Prostate cancer ([place]) 01/2016   prostatectomy    Shortness of breath dyspnea    Past Surgical History:  Procedure Laterality Date   HERNIA REPAIR  last done 3-4 yrs ago   x 2   INGUINAL HERNIA REPAIR Right 11/20/2013   Procedure: OPEN REPAIR OF RECURRENT RIGHT INGUINAL HERNIA WITH MESH;  Surgeon: [name]. [name], MD;  Location: [facility];  Service: General;  Laterality: Right;   INSERTION OF MESH Right 11/20/2013   Procedure: INSERTION OF MESH;  Surgeon: [name]. [name], MD;  Location: [facility];  Service: General;  Laterality: Right;   KNEE SURGERY Left july 2014   mcl and meniscus repair   LYMPHADENECTOMY Bilateral 02/09/2016   Procedure: PELVIC LYMPHADENECTOMY;  Surgeon: [name], MD;  Location: [facility];  Service: Urology;  Laterality: Bilateral;   right knee  arthroscopy      ROBOT ASSISTED LAPAROSCOPIC RADICAL PROSTATECTOMY N/A 02/09/2016   Procedure: XI ROBOTIC ASSISTED LAPAROSCOPIC RADICAL PROSTATECTOMY WITH INDOCYANINE GREEN DYE AND ADHESIOLYSIS;  Surgeon: [name], MD;  Location: [facility];  Service: Urology;  Laterality: N/A;   TOTAL KNEE ARTHROPLASTY Right 09/06/2015   Procedure: RIGHT TOTAL KNEE ARTHROPLASTY;  Surgeon: [name], MD;  Location: [facility];  Service: Orthopedics;  Laterality: Right;   TOTAL  KNEE ARTHROPLASTY Left 06/17/2019   Procedure: TOTAL KNEE ARTHROPLASTY;  Surgeon: [name], MD;  Location: [facility];  Service: Orthopedics;  Laterality: Left;  70 mins   Allergies  Allergen Reactions   Percocet [Oxycodone-Acetaminophen] Other (See Comments)    Nausea-Doesn't work   Amoxicillin Rash    Has patient had a PCN reaction causing immediate rash, facial/tongue/throat swelling, SOB or lightheadedness with hypotension: No Has patient had a PCN reaction causing severe rash involving mucus membranes or skin necrosis: No Has patient had a PCN reaction that required hospitalization No Has patient had a PCN reaction occurring within the last 10 years: Yes- 2016 If all of the above answers are "NO", then may proceed with Cephalosporin use.    Prior to Admission medications   Medication Sig Start Date End Date Taking? Authorizing Provider  albuterol (PROVENTIL HFA) 108 (90 Base) MCG/ACT inhaler Proventil HFA 90 mcg/actuation aerosol inhaler   2 puffs by inhalation route every 4-6 hours as needed. 09/15/20  Yes Collene Gobble, MD  amLODipine (NORVASC) 2.5 MG tablet Take 1 tablet (2.5 mg total) by mouth daily. 01/10/21  Yes [name], MD  amLODipine (NORVASC) 5 MG tablet TAKE ONE TABLET BY MOUTH ONE TIME DAILY. COMBINE WITH 2.$RemoveBefore'5MG'[id number]$  FOR TOTAL DOSE OF 7.$RemoveB'5MG'[id number]$  05/30/21  Yes [name], MD  ANORO ELLIPTA 62.5-25 MCG/INH AEPB INHALE ONE PUFF BY MOUTH INTO THE LUNGS ONE TIME DAILY 10/20/20  Yes Collene Gobble, MD  azelastine (OPTIVAR) 0.05 % ophthalmic solution Place 1 drop into both eyes 2 (two) times daily as needed. For itching/allergy symptoms 07/14/19  Yes [name], MD  celecoxib (CELEBREX) 200 MG capsule Take 1 capsule (200 mg total) by mouth 2 (two) times daily. 06/18/19  Yes Irving Copas, PA-C  Cholecalciferol (VITAMIN D3) 50 MCG (2000 UT) TABS Take 2,000 Units by mouth daily.   Yes [provider]  clindamycin (CLEOCIN) 300 MG capsule Take 1 capsule (300 mg  total) by mouth 3 (three) times daily. 01/12/21  Yes Trula Slade, DPM  clobetasol cream (TEMOVATE) 0.05 % APPLY TOPICALLY TO AFFECTED AREA(S) NIGHTLY AT BEDTIME 08/30/20  Yes Lavonna Monarch, MD  cyclobenzaprine (FLEXERIL) 5 MG tablet take one tablet by mouth three times daily as needed for muscle spasm. start at bedtime due to sedation 01/12/20  Yes [name], MD  [place] COVID-19 [place] TEST KIT  11/09/20  Yes [provider]  fluticasone (KLS ALLER-FLO) 50 MCG/ACT nasal spray Place 2 sprays into both nostrils daily. 01/10/21  Yes [name], MD  HYDROcodone-acetaminophen (NORCO/VICODIN) 5-325 MG tablet Take 1 tablet by mouth every 6 (six) hours as needed. 02/21/21  Yes Trula Slade, DPM  INCRUSE ELLIPTA 62.5 MCG/INH AEPB INHALE ONE PUFF BY MOUTH INTO THE LUNGS ONCE DAILY 02/24/20  Yes Collene Gobble, MD  KLS ESOMEPRAZOLE MAGNESIUM 20 MG capsule TAKE TWO CAPSULES BY MOUTH DAILY 03/16/21  Yes [name], MD  meloxicam (MOBIC) 15 MG tablet TAKE ONE TABLET BY MOUTH ONE TIME DAILY 05/03/21  Yes  Trula Slade, DPM  Menthol-Methyl Salicylate (SALONPAS PAIN RELIEF PATCH EX) Place 1 patch onto the skin daily as needed (pain.).   Yes [provider]  methocarbamol (ROBAXIN) 500 MG tablet Take 1 tablet (500 mg total) by mouth every 6 (six) hours as needed for muscle spasms. 06/18/19  Yes Irving Copas, PA-C  pravastatin (PRAVACHOL) 20 MG tablet Take 1 tablet (20 mg total) by mouth daily. 01/10/21  Yes [name], MD  promethazine (PHENERGAN) 25 MG tablet Take 1 tablet (25 mg total) by mouth every 8 (eight) hours as needed for nausea or vomiting. 01/12/21  Yes Trula Slade, DPM  triamcinolone cream (KENALOG) 0.1 % Apply 1 application topically 2 (two) times daily as needed. To affected areas only as needed. Patient taking differently: Apply 1 application topically 2 (two) times daily as needed (rash). 01/08/19  Yes [name], MD   Social  History   Socioeconomic History   Marital status: Married    Spouse name: Not on file   Number of children: Not on file   Years of education: Not on file   Highest education level: Not on file  Occupational History   Not on file  Tobacco Use   Smoking status: Former    Packs/day: 1.50    Years: 20.00    Pack years: 30.00    Types: Cigarettes    Quit date: 11/05/1999    Years since quitting: 21.6   Smokeless tobacco: Never  Vaping Use   Vaping Use: Never used  Substance and Sexual Activity   Alcohol use: No    Comment: last used 25 years ago    Drug use: Yes    Types: Cocaine    Comment: last used cocaine  and acid 25 yrs ago,none used since   Sexual activity: Not on file  Other Topics Concern   Not on file  Social History Narrative   Not on file   Social Determinants of Health   Financial Resource Strain: Not on file  Food Insecurity: Not on file  Transportation Needs: Not on file  Physical Activity: Not on file  Stress: Not on file  Social Connections: Not on file  Intimate Partner Violence: Not on file    Observations/Objective: Temp 97.9 this am. No home O2 sat.  Had negative rapid covid test today.  Nontoxic appearance on video. Hoarse voice at times but able to phonate for the majority of visit.  Speaking in full sentences without respiratory distress.  Clearing secretions.  Appropriate, coherent responses.  Scattered white patches across the dorsum of tongue, visualized buccal mucosa without apparent exudate/patches but difficult over video.  Visualize posterior oropharynx with midline uvula, no appreciable swelling or uvula deviation noted.  Some mucus noted at posterior tongue.  No audible stridor or wheeze on video.  Assessment and Plan: Sore throat - Plan: fluconazole (DIFLUCAN) 150 MG tablet  Thrush - Plan: fluconazole (DIFLUCAN) 150 MG tablet  COPD, mild ([facility])  Sore throat, with coating of tongue, burning of tongue, suspected thrush with possible  esophageal candidiasis with throat symptoms, pain and burning with swallowing.  Difficult exam over video.  Initially discussed tight/swollen feeling to the back of his throat but he states that has improved.  Able to clear secretions, speaking normally without respiratory distress or audible stridor.  No apparent airway compromise on exam.  Shortness of breath may be related to cessation of Anora Ellipta and his COPD.  -Start Diflucan 300 mg x 1 then  150 mg daily x14 days to cover for possible esophageal candidiasis  -ER/911 precautions given if any tightening of throat or other symptoms of airway compromise.  Understanding expressed and wife present as well for instructions.  -Symptomatic care per handout, repeat eval in 5 days, sooner if worse/ER if worse.  -Although unlikely with negative testing today, recommended repeat COVID test tomorrow night or Friday morning and to advise me of results.  Follow Up Instructions: 5 days with ER/urgent care precautions given   I discussed the assessment and treatment plan with the patient. The patient was provided an opportunity to ask questions and all were answered. The patient agreed with the plan and demonstrated an understanding of the instructions.   The patient was advised to call back or seek an in-person evaluation if the symptoms worsen or if the condition fails to improve as anticipated.  I provided 35 minutes of non-face-to-face time during this encounter.   [name], MD

## 2021-06-24 ENCOUNTER — Telehealth: Payer: Self-pay

## 2021-06-24 ENCOUNTER — Encounter: Payer: Self-pay | Admitting: Family Medicine

## 2021-06-24 NOTE — Telephone Encounter (Signed)
Noted  

## 2021-06-24 NOTE — Telephone Encounter (Signed)
Caller name:[name]   On DPR? :Yes  Call back number:[phone]  Provider they see: Dr Carlota Raspberry   Reason for call:Dr Carlota Raspberry wanted pt to call back with results of Covid test and they come back negative

## 2021-06-24 NOTE — Telephone Encounter (Signed)
Patient states he was told to follow up with his covid results which were negative. FYI

## 2021-06-24 NOTE — Telephone Encounter (Signed)
I have sent Dr. Carlota Raspberry a message to give him this information.

## 2021-06-27 ENCOUNTER — Other Ambulatory Visit: Payer: Self-pay

## 2021-06-27 ENCOUNTER — Ambulatory Visit (INDEPENDENT_AMBULATORY_CARE_PROVIDER_SITE_OTHER): Payer: 59

## 2021-06-27 ENCOUNTER — Ambulatory Visit (INDEPENDENT_AMBULATORY_CARE_PROVIDER_SITE_OTHER): Payer: 59 | Admitting: Podiatry

## 2021-06-27 DIAGNOSIS — M21612 Bunion of left foot: Secondary | ICD-10-CM

## 2021-06-27 DIAGNOSIS — M21611 Bunion of right foot: Secondary | ICD-10-CM | POA: Diagnosis not present

## 2021-06-27 DIAGNOSIS — Z9889 Other specified postprocedural states: Secondary | ICD-10-CM

## 2021-06-27 DIAGNOSIS — L84 Corns and callosities: Secondary | ICD-10-CM

## 2021-06-27 DIAGNOSIS — M2042 Other hammer toe(s) (acquired), left foot: Secondary | ICD-10-CM | POA: Diagnosis not present

## 2021-06-30 NOTE — Progress Notes (Signed)
Subjective: [name] is a 70 y.o. is seen today in office s/p right foot first MPJ arthrodesis preformed on 01/12/2021.  States he is doing well in the right foot.  He has some pain in the bottom of the foot with the calluses not recurred.  He has new concerns of pain to the left third toe as well as a thick callus to the tip of the toe.  No swelling redness or any drainage or any open sores.  No fevers or chills that he reports.  No other concerns today.    Objective: General: No acute distress, AAOx3  DP/PT pulses palpable 2/4, CRT < 3 sec to all digits.  Protective sensation intact. Motor function intact.  RIGHT foot: Incision is well coapted without any evidence of dehiscence scar is well formed.  There is no pain on the surgical site.  Arthrodesis site appears to be stable.  No significant reoccurrence of the calluses.  Prominent sesamoid is present with slight discomfort.  There is no other area discomfort identified the right side. LEFT foot: Moderate bunion is present mild tenderness palpation on the bunion site.  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  Upon debridement there is no underlying ulceration drainage or any signs of infection. No other open lesions or pre-ulcerative lesions.  No pain with calf compression, swelling, warmth, erythema.   Assessment and Plan:  Status post right foot surgery, doing well with no complications; left foot bunion, hammertoe resulting hyperkeratotic lesion  -Treatment options discussed including all alternatives, risks, and complications -X-rays obtained and reviewed of the right foot.  Hardware intact without any complicating factors.  Arthrodesis site appears to be healing well. -From right foot standpoint he is doing well.  Continue shoes and good arch support, orthotics.  He is returned back to work full-time. -On the left foot sharply debrided the hyperkeratotic lesion x1  without any complications or bleeding.  Continue offloading for both the bunion as well as hammertoes.  Discussed shoe modifications.  He wants to consider surgery but likely do this after the first of the year.  Trula Slade DPM

## 2021-07-11 ENCOUNTER — Ambulatory Visit (INDEPENDENT_AMBULATORY_CARE_PROVIDER_SITE_OTHER): Payer: 59 | Admitting: Family Medicine

## 2021-07-11 ENCOUNTER — Other Ambulatory Visit: Payer: Self-pay

## 2021-07-11 VITALS — BP 130/74 | HR 71 | Temp 98.4°F | Resp 16 | Ht 71.0 in | Wt 231.2 lb

## 2021-07-11 DIAGNOSIS — I10 Essential (primary) hypertension: Secondary | ICD-10-CM

## 2021-07-11 DIAGNOSIS — R140 Abdominal distension (gaseous): Secondary | ICD-10-CM

## 2021-07-11 DIAGNOSIS — R1012 Left upper quadrant pain: Secondary | ICD-10-CM

## 2021-07-11 DIAGNOSIS — E785 Hyperlipidemia, unspecified: Secondary | ICD-10-CM | POA: Diagnosis not present

## 2021-07-11 LAB — COMPREHENSIVE METABOLIC PANEL
ALT: 26 U/L (ref 0–53)
AST: 18 U/L (ref 0–37)
Albumin: 4.6 g/dL (ref 3.5–5.2)
Alkaline Phosphatase: 86 U/L (ref 39–117)
BUN: 14 mg/dL (ref 6–23)
CO2: 30 mEq/L (ref 19–32)
Calcium: 9.5 mg/dL (ref 8.4–10.5)
Chloride: 104 mEq/L (ref 96–112)
Creatinine, Ser: 0.79 mg/dL (ref 0.40–1.50)
GFR: 90.44 mL/min (ref >60.00)
Glucose, Bld: 93 mg/dL (ref 70–99)
Potassium: 4.2 mEq/L (ref 3.5–5.1)
Sodium: 141 mEq/L (ref 135–145)
Total Bilirubin: 0.6 mg/dL (ref 0.2–1.2)
Total Protein: 6.8 g/dL (ref 6.0–8.3)

## 2021-07-11 LAB — LIPID PANEL
Cholesterol: 143 mg/dL (ref 0–200)
HDL: 35 mg/dL — ABNORMAL LOW (ref >39.00)
LDL Cholesterol: 95 mg/dL (ref 0–99)
NonHDL: 107.69
Total CHOL/HDL Ratio: 4
Triglycerides: 65 mg/dL (ref 0.0–149.0)
VLDL: 13 mg/dL (ref 0.0–40.0)

## 2021-07-11 LAB — LIPASE: Lipase: 20 U/L (ref 11.0–59.0)

## 2021-07-11 MED ORDER — AMLODIPINE BESYLATE 5 MG PO TABS: ORAL_TABLET | ORAL | 1 refills | Status: DC

## 2021-07-11 MED ORDER — PRAVASTATIN SODIUM 20 MG PO TABS: 20.0000 mg | ORAL_TABLET | Freq: Every day | ORAL | 1 refills | Status: DC

## 2021-07-11 MED ORDER — AMLODIPINE BESYLATE 2.5 MG PO TABS: 2.5000 mg | ORAL_TABLET | Freq: Every day | ORAL | 1 refills | Status: DC

## 2021-07-11 NOTE — Progress Notes (Signed)
Subjective:  Patient ID: [name], male    DOB: Feb 16, 1951  Age: 70 y.o. MRN: [id number]  CC:  Chief Complaint  Patient presents with   Hyperlipidemia    Pt here to have recheck on labs and get refill medication today    Hypertension    Pt taking medication denies physical sxs, no concerns today    Abdominal Pain    Pt reports some pain in stomach with palpation, notes recent worsened bloating over the last couple weeks no other concerns today     HPI [name] presents for   Prior treatment for thrush - resolved.   Hypertension: Amlodipine 7.5 mg daily Home readings:none.  No new side effects.  BP Readings from Last 3 Encounters:  07/11/21 130/74  01/10/21 134/68  08/09/20 132/80   Lab Results  Component Value Date   CREATININE 0.79 07/11/2021   Hyperlipidemia: Pravastatin 20 mg daily, no new myalgias/side effects.  Lab Results  Component Value Date   CHOL 143 07/11/2021   HDL 35.00 (L) 07/11/2021   LDLCALC 95 07/11/2021   TRIG 65.0 07/11/2021   CHOLHDL 4 07/11/2021   Lab Results  Component Value Date   ALT 26 07/11/2021   AST 18 07/11/2021   ALKPHOS 86 07/11/2021   BILITOT 0.6 07/11/2021   Abdominal pain/bloating Sometimes tender to press on stomach. Feels larger at times over past year. Has been gaining weight - some gain after foot surgery. No night sweats. No f/c, n/v, or blood in stool. Concerned as friend had cancer in pancreas and stomach got bigger. No dysuria/hematuria. No blood in stool. BM daily - normal, no change in stool.  No alcohol in 78yr.  Colonoscopy 05/14/2019.  Prostatectomy for prostate CA in 2017. Undetectable PSA at visit with urology earlier this year.    Patient Active Problem List   Diagnosis Date Noted   Diverticulitis of colon 04/19/2021   S/P left TKA 06/17/2019   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/17/2019   Pain in left knee 06/02/2019   Allergic rhinitis 03/12/2017   Thrush, oral 02/27/2017    SUI (stress urinary incontinence), male 03/20/2016   Prostate cancer ([place] 02/09/2016   Obese 09/08/2015   S/P right TKA 09/06/2015   S/P knee replacement 09/06/2015   [place] 07/20/2014   Pulmonary nodules 02/23/2014   Tobacco use disorder, moderate, in sustained remission 02/23/2014   Recurrent right inguinal hernia 10/20/2013   ED (erectile dysfunction) 08/26/2012   Physical exam, annual 11/05/2011   HTN (hypertension) 11/05/2011   GERD (gastroesophageal reflux disease) 11/05/2011   Chronic joint pain 11/05/2011   Past Medical History:  Diagnosis Date   Allergy    seasonal   Arthritis    oa   Asthma    COPD (chronic obstructive pulmonary disease) ([place]    GERD (gastroesophageal reflux disease)    Hypertension    [place] 01[place]   prostatectomy    Shortness of breath dyspnea    Past Surgical History:  Procedure Laterality Date   HERNIA REPAIR  last done 3-4 yrs ago   x 2   INGUINAL HERNIA REPAIR Right 11/20/2013   Procedure: OPEN REPAIR OF RECURRENT RIGHT INGUINAL HERNIA WITH MESH;  Surgeon: MImogene Burn TGeorgette Dover MD;  Location: [facility];  Service: General;  Laterality: Right;   INSERTION OF MESH Right 11/20/2013   Procedure: INSERTION OF MESH;  Surgeon: MImogene Burn TGeorgette Dover MD;  Location: [facility];  Service: General;  Laterality: Right;  KNEE SURGERY Left july 2014   mcl and meniscus repair   LYMPHADENECTOMY Bilateral 02/09/2016   Procedure: PELVIC LYMPHADENECTOMY;  Surgeon: [name], MD;  Location: [facility];  Service: Urology;  Laterality: Bilateral;   right knee arthroscopy      ROBOT ASSISTED LAPAROSCOPIC RADICAL PROSTATECTOMY N/A 02/09/2016   Procedure: XI ROBOTIC ASSISTED LAPAROSCOPIC RADICAL PROSTATECTOMY WITH INDOCYANINE GREEN DYE AND ADHESIOLYSIS;  Surgeon: [name], MD;  Location: [facility];  Service: Urology;  Laterality: N/A;   TOTAL KNEE ARTHROPLASTY Right 09/06/2015   Procedure: RIGHT TOTAL KNEE ARTHROPLASTY;  Surgeon: [name], MD;   Location: [facility];  Service: Orthopedics;  Laterality: Right;   TOTAL KNEE ARTHROPLASTY Left 06/17/2019   Procedure: TOTAL KNEE ARTHROPLASTY;  Surgeon: [name], MD;  Location: [facility];  Service: Orthopedics;  Laterality: Left;  70 mins   Allergies  Allergen Reactions   Percocet [Oxycodone-Acetaminophen] Other (See Comments)    Nausea-Doesn't work   Amoxicillin Rash    Has patient had a PCN reaction causing immediate rash, facial/tongue/throat swelling, SOB or lightheadedness with hypotension: No Has patient had a PCN reaction causing severe rash involving mucus membranes or skin necrosis: No Has patient had a PCN reaction that required hospitalization No Has patient had a PCN reaction occurring within the last 10 years: Yes- 2016 If all of the above answers are "NO", then may proceed with Cephalosporin use.    Prior to Admission medications   Medication Sig Start Date End Date Taking? Authorizing Provider  albuterol (PROVENTIL HFA) 108 (90 Base) MCG/ACT inhaler Proventil HFA 90 mcg/actuation aerosol inhaler   2 puffs by inhalation route every 4-6 hours as needed. 09/15/20  Yes Collene Gobble, MD  amLODipine (NORVASC) 2.5 MG tablet Take 1 tablet (2.5 mg total) by mouth daily. 01/10/21  Yes [name], MD  amLODipine (NORVASC) 5 MG tablet TAKE ONE TABLET BY MOUTH ONE TIME DAILY. COMBINE WITH 2.5MG FOR TOTAL DOSE OF 7.5MG 05/30/21  Yes [name], MD  ANORO ELLIPTA 62.5-25 MCG/INH AEPB INHALE ONE PUFF BY MOUTH INTO THE LUNGS ONE TIME DAILY 10/20/20  Yes Collene Gobble, MD  azelastine (OPTIVAR) 0.05 % ophthalmic solution Place 1 drop into both eyes 2 (two) times daily as needed. For itching/allergy symptoms 07/14/19  Yes [name], MD  celecoxib (CELEBREX) 200 MG capsule Take 1 capsule (200 mg total) by mouth 2 (two) times daily. 06/18/19  Yes Irving Copas, PA-C  Cholecalciferol (VITAMIN D3) 50 MCG (2000 UT) TABS Take 2,000 Units by mouth daily.   Yes [provider]  clindamycin (CLEOCIN) 300 MG capsule Take 1 capsule (300 mg total) by mouth 3 (three) times daily. 01/12/21  Yes Trula Slade, DPM  clobetasol cream (TEMOVATE) 0.05 % APPLY TOPICALLY TO AFFECTED AREA(S) NIGHTLY AT BEDTIME 08/30/20  Yes Lavonna Monarch, MD  [place] COVID-19 [place] TEST KIT  11/09/20  Yes [provider]  fluconazole (DIFLUCAN) 150 MG tablet Take 1 tablet (150 mg total) by mouth daily. Take 311m on day 1, then 1541monce per day. 06/22/21  Yes Burns, StClaudina LickMD  fluticasone (KLS ALLER-FLO) 50 MCG/ACT nasal spray Place 2 sprays into both nostrils daily. 01/10/21  Yes GrWendie AgresteMD  INCRUSE ELLIPTA 62.5 MCG/INH AEPB INHALE ONE PUFF BY MOUTH INTO THE LUNGS ONCE DAILY 02/24/20  Yes ByCollene GobbleMD  KLS ESOMEPRAZOLE MAGNESIUM 20 MG capsule TAKE TWO CAPSULES BY MOUTH DAILY 03/16/21  Yes GrWendie AgresteMD  meloxicam (MOBIC) 15 MG tablet TAKE ONE TABLET BY MOUTH ONE TIME DAILY 05/03/21  Yes Trula Slade, DPM  Menthol-Methyl Salicylate (SALONPAS PAIN RELIEF PATCH EX) Place 1 patch onto the skin daily as needed (pain.).   Yes [provider]  pravastatin (PRAVACHOL) 20 MG tablet Take 1 tablet (20 mg total) by mouth daily. 01/10/21  Yes [name], MD  promethazine (PHENERGAN) 25 MG tablet Take 1 tablet (25 mg total) by mouth every 8 (eight) hours as needed for nausea or vomiting. 01/12/21  Yes Trula Slade, DPM  triamcinolone cream (KENALOG) 0.1 % Apply 1 application topically 2 (two) times daily as needed. To affected areas only as needed. Patient taking differently: Apply 1 application topically 2 (two) times daily as needed (rash). 01/08/19  Yes [name], MD  cyclobenzaprine (FLEXERIL) 5 MG tablet take one tablet by mouth three times daily as needed for muscle spasm. start at bedtime due to sedation Patient not taking: Reported on 07/11/2021 01/12/20   [name], MD  HYDROcodone-acetaminophen (NORCO/VICODIN)  5-325 MG tablet Take 1 tablet by mouth every 6 (six) hours as needed. Patient not taking: Reported on 07/11/2021 02/21/21   Trula Slade, DPM   Social History   Socioeconomic History   Marital status: Married    Spouse name: Not on file   Number of children: Not on file   Years of education: Not on file   Highest education level: Not on file  Occupational History   Not on file  Tobacco Use   Smoking status: Former    Packs/day: 1.50    Years: 20.00    Pack years: 30.00    Types: Cigarettes    Quit date: 11/05/1999    Years since quitting: 21.6   Smokeless tobacco: Never  Vaping Use   Vaping Use: Never used  Substance and Sexual Activity   Alcohol use: No    Comment: last used 25 years ago    Drug use: Yes    Types: Cocaine    Comment: last used cocaine  and acid 25 yrs ago,none used since   Sexual activity: Not on file  Other Topics Concern   Not on file  Social History Narrative   Not on file   Social Determinants of Health   Financial Resource Strain: Not on file  Food Insecurity: Not on file  Transportation Needs: Not on file  Physical Activity: Not on file  Stress: Not on file  Social Connections: Not on file  Intimate Partner Violence: Not on file    Review of Systems  Constitutional:  Negative for fatigue and unexpected weight change.  Eyes:  Negative for visual disturbance.  Respiratory:  Negative for cough, chest tightness and shortness of breath.   Cardiovascular:  Negative for chest pain, palpitations and leg swelling.  Gastrointestinal:  Positive for abdominal distention and abdominal pain (with palpation at times only.). Negative for anal bleeding, blood in stool, constipation, diarrhea, nausea and vomiting.  Neurological:  Negative for dizziness, light-headedness and headaches.    Objective:   Vitals:   07/11/21 1037  BP: 130/74  Pulse: 71  Resp: 16  Temp: 98.4 F (36.9 C)  TempSrc: Temporal  SpO2: 96%  Weight: 231 lb 3.2 oz (104.9  kg)  Height: 5' 11"  (1.803 m)     Physical Exam Vitals reviewed.  Constitutional:      Appearance: He is well-developed.  HENT:     Head: Normocephalic and atraumatic.  Mouth/Throat:     Mouth: Mucous membranes are moist.     Comments: External mucosa without exudate or white patches, posterior oropharynx appears normal tissue.  Speaking, swallowing normally.  Neck:     Vascular: No carotid bruit or JVD.  Cardiovascular:     Rate and Rhythm: Normal rate and regular rhythm.     Heart sounds: Normal heart sounds. No murmur heard. Pulmonary:     Effort: Pulmonary effort is normal.     Breath sounds: Normal breath sounds. No rales.  Musculoskeletal:     Right lower leg: No edema.     Left lower leg: No edema.  Skin:    General: Skin is warm and dry.  Neurological:     Mental Status: He is alert and oriented to person, place, and time.  Psychiatric:        Mood and Affect: Mood normal.       Assessment & Plan:  ALYAS CREARY is a 70 y.o. male . Hyperlipidemia, unspecified hyperlipidemia type - Plan: Comprehensive metabolic panel, Lipid panel, pravastatin (PRAVACHOL) 20 MG tablet  -  Stable, tolerating current regimen. Medications refilled. Labs pending as above.   Essential hypertension - Plan: amLODipine (NORVASC) 2.5 MG tablet, amLODipine (NORVASC) 5 MG tablet  -  Stable, tolerating current regimen. Medications refilled. Labs pending as above.    Left upper quadrant abdominal pain - Plan: Lipase, US Abdomen Complete Abdominal distension - Plan: Lipase, US Abdomen Complete  -Abdominal fullness may be due to some weight gain after prior foot surgery.  Episodic discomfort.  Start with ultrasound, labs as above, RTC/ER precautions given.  Meds ordered this encounter  Medications   amLODipine (NORVASC) 2.5 MG tablet    Sig: Take 1 tablet (2.5 mg total) by mouth daily.    Dispense:  90 tablet    Refill:  1   amLODipine (NORVASC) 5 MG tablet    Sig: TAKE ONE TABLET  BY MOUTH ONE TIME DAILY. COMBINE WITH 2.5MG FOR TOTAL DOSE OF 7.5MG    Dispense:  90 tablet    Refill:  1   pravastatin (PRAVACHOL) 20 MG tablet    Sig: Take 1 tablet (20 mg total) by mouth daily.    Dispense:  90 tablet    Refill:  1   Patient Instructions  No change in blood pressure or cholesterol medications.  If symptoms of thrush return, please come see me in person and we can decide if new treatment or change in your current medications.  I will check some labs and ordered an ultrasound for your abdominal discomfort and enlargement/distention.  Depending on those results he can follow-up to decide next steps or possible meeting with gastroenterology.  If any new abdominal pain or worsening symptoms be seen right away.    Signed,   [name], MD [facility] 07/11/21 6:03 PM

## 2021-07-11 NOTE — Patient Instructions (Signed)
No change in blood pressure or cholesterol medications.  If symptoms of thrush return, please come see me in person and we can decide if new treatment or change in your current medications.  I will check some labs and ordered an ultrasound for your abdominal discomfort and enlargement/distention.  Depending on those results he can follow-up to decide next steps or possible meeting with gastroenterology.  If any new abdominal pain or worsening symptoms be seen right away.

## 2021-07-21 ENCOUNTER — Other Ambulatory Visit: Payer: Self-pay | Admitting: Emergency Medicine

## 2021-07-23 ENCOUNTER — Other Ambulatory Visit: Payer: Self-pay | Admitting: Emergency Medicine

## 2021-07-26 ENCOUNTER — Telehealth: Payer: Self-pay | Admitting: Emergency Medicine

## 2021-07-26 MED ORDER — ANORO ELLIPTA 62.5-25 MCG/ACT IN AEPB: 1.0000 | INHALATION_SPRAY | Freq: Every day | RESPIRATORY_TRACT | 1 refills | Status: DC

## 2021-07-26 NOTE — Telephone Encounter (Signed)
Rx for the anoro has been sent to the pharmacy.  Nothng further is needed.

## 2021-08-01 ENCOUNTER — Ambulatory Visit
Admission: RE | Admit: 2021-08-01 | Discharge: 2021-08-01 | Disposition: A | Discharge status: Home / Self Care | Payer: 59 | Source: Ambulatory Visit | Attending: Family Medicine | Admitting: Family Medicine

## 2021-08-01 DIAGNOSIS — R1012 Left upper quadrant pain: Secondary | ICD-10-CM

## 2021-08-01 DIAGNOSIS — R140 Abdominal distension (gaseous): Secondary | ICD-10-CM

## 2021-08-04 ENCOUNTER — Other Ambulatory Visit: Payer: Self-pay | Admitting: Family Medicine

## 2021-08-04 NOTE — Progress Notes (Unsigned)
See ultrasound results

## 2021-08-05 ENCOUNTER — Other Ambulatory Visit: Payer: Self-pay | Admitting: Family Medicine

## 2021-08-05 DIAGNOSIS — N2889 Other specified disorders of kidney and ureter: Secondary | ICD-10-CM

## 2021-08-05 DIAGNOSIS — N289 Disorder of kidney and ureter, unspecified: Secondary | ICD-10-CM

## 2021-08-05 NOTE — Progress Notes (Signed)
See lab notes - will order MRI with renal protocol.

## 2021-08-16 ENCOUNTER — Other Ambulatory Visit: Payer: Self-pay | Admitting: Family Medicine

## 2021-08-22 ENCOUNTER — Ambulatory Visit (INDEPENDENT_AMBULATORY_CARE_PROVIDER_SITE_OTHER): Payer: 59 | Admitting: Dermatology

## 2021-08-22 ENCOUNTER — Other Ambulatory Visit: Payer: Self-pay

## 2021-08-22 DIAGNOSIS — L439 Lichen planus, unspecified: Secondary | ICD-10-CM

## 2021-08-22 DIAGNOSIS — L910 Hypertrophic scar: Secondary | ICD-10-CM | POA: Diagnosis not present

## 2021-08-22 DIAGNOSIS — D171 Benign lipomatous neoplasm of skin and subcutaneous tissue of trunk: Secondary | ICD-10-CM

## 2021-08-22 DIAGNOSIS — L723 Sebaceous cyst: Secondary | ICD-10-CM

## 2021-08-22 MED ORDER — CLOBETASOL PROP EMOLLIENT BASE 0.05 % EX CREA: 1.0000 "application " | TOPICAL_CREAM | Freq: Every evening | CUTANEOUS | 11 refills | Status: AC

## 2021-08-23 MED ORDER — CLOBETASOL PROP EMOLLIENT BASE 0.05 % EX CREA: 1.0000 "application " | TOPICAL_CREAM | Freq: Two times a day (BID) | CUTANEOUS | 3 refills | Status: AC

## 2021-09-02 ENCOUNTER — Ambulatory Visit (INDEPENDENT_AMBULATORY_CARE_PROVIDER_SITE_OTHER): Payer: 59 | Admitting: Emergency Medicine

## 2021-09-02 ENCOUNTER — Other Ambulatory Visit: Payer: Self-pay

## 2021-09-02 ENCOUNTER — Encounter: Payer: Self-pay | Admitting: Emergency Medicine

## 2021-09-02 DIAGNOSIS — J309 Allergic rhinitis, unspecified: Secondary | ICD-10-CM | POA: Diagnosis not present

## 2021-09-02 DIAGNOSIS — J449 Chronic obstructive pulmonary disease, unspecified: Secondary | ICD-10-CM

## 2021-09-02 NOTE — Patient Instructions (Addendum)
Please continue Anoro 1 elation once daily. Keep your albuterol available to use 2 puffs when you need it for shortness of breath, chest tightness, wheezing. Continue your fluticasone nasal spray, 2 sprays each nostril once daily. Flu shot up-to-date COVID booster up-to-date Follow with Dr. [name] in 12 months or sooner if you have any problems.

## 2021-09-02 NOTE — Assessment & Plan Note (Signed)
Continues to do quite well.  No flares, minimal symptoms.  Well managed on Anoro.  He does believe that he is benefiting from it.  He uses albuterol intermittently, often in the mornings when he wakes up.  Good functional capacity, still works.  Plan to continue same regimen  Please continue Anoro 1 elation once daily. Keep your albuterol available to use 2 puffs when you need it for shortness of breath, chest tightness, wheezing. Flu shot up-to-date COVID booster up-to-date Follow with Dr. [name] in 12 months or sooner if you have any problems.

## 2021-09-02 NOTE — Progress Notes (Signed)
Subjective:    Patient ID: [name], male    DOB: 02-Aug-1951, 70 y.o.   MRN: [id number]  HPI  ROV 08/09/20 --[name] is 70, follows up today for history of COPD.  He was also followed with serial imaging for pulmonary nodules that were deemed benign after stability.  He has some chronic cough, GERD, chronic rhinitis as well.  Today he reports he remains active, is able to do his usual activities without SOB. He does feels some SOB initially when he gets up out of bed, then resolves. We changed Incruse to Anoro. He has never needed albuterol, will get it refilled. Still able to work.  COVID and flu shots up to date  ROV 09/02/21 --70 year old gentleman with a history of COPD, FEV1 3.02 L (94% predicted) in 2015.  Also with a history of pulmonary nodules which we followed with serial imaging and which were benign.  He deals with chronic rhinitis, GERD and chronic cough.  Currently managed on Anoro.  Today he reports that he is doing well. He is able to be active, still able to work at [employer]. Not limited by his breathing. No cough or wheezing. He can sometimes get strangled on liquids. Happens a few times a week. Remains on Anoro - does believe that it helps him. Uses his albuterol most mornings for a "jump start". Uses flonase daily.  Flu shot up to date, covid booster up to date.  He is supposed to have an MRI next week to evaluate a R renal complex lesion.   Review of Systems  Constitutional:  Negative for fever and unexpected weight change.  HENT:  Negative for congestion, dental problem, ear pain, nosebleeds, postnasal drip, rhinorrhea, sinus pressure, sneezing, sore throat and trouble swallowing.   Eyes:  Negative for redness and itching.  Respiratory:  Negative for chest tightness, shortness of breath and wheezing.   Cardiovascular:  Negative for palpitations and leg swelling.       Knees   Gastrointestinal:  Negative for nausea and vomiting.  Genitourinary:  Negative for dysuria.   Musculoskeletal:  Negative for joint swelling.  Skin:  Negative for rash.  Neurological:  Negative for headaches.  Hematological:  Does not bruise/bleed easily.  Psychiatric/Behavioral:  Negative for dysphoric mood. The patient is not nervous/anxious.        Objective:   Physical Exam Vitals:   09/02/21 1610  BP: (!) 146/84  Pulse: 92  Temp: 98.2 F (36.8 C)  TempSrc: Oral  SpO2: 93%  Weight: 233 lb 12.8 oz (106.1 kg)  Height: 6' (1.829 m)   Gen: Pleasant, well-nourished, in no distress,  normal affect  ENT: No lesions,  mouth clear,  oropharynx clear, no postnasal drip  Neck: No JVD, no stridor  Lungs: No use of accessory muscles, clear without rales or rhonchi  Cardiovascular: RRR, heart sounds normal, no murmur or gallops, no peripheral edema  Musculoskeletal: No deformities, no cyanosis or clubbing  Neuro: alert, non focal  Skin: Warm, no lesions or rash       Assessment & Plan:  COPD, mild Continues to do quite well.  No flares, minimal symptoms.  Well managed on Anoro.  He does believe that he is benefiting from it.  He uses albuterol intermittently, often in the mornings when he wakes up.  Good functional capacity, still works.  Plan to continue same regimen  Please continue Anoro 1 elation once daily. Keep your albuterol available to use 2 puffs when you need it  for shortness of breath, chest tightness, wheezing. Flu shot up-to-date COVID booster up-to-date Follow with Dr. [name] in 12 months or sooner if you have any problems.   Allergic rhinitis Continue your fluticasone nasal spray, 2 sprays each nostril once daily.  Baltazar Apo, MD, PhD 09/02/2021, 4:27 PM [facility] [phone] or if no answer [phone]

## 2021-09-02 NOTE — Assessment & Plan Note (Signed)
Continue your fluticasone nasal spray, 2 sprays each nostril once daily.

## 2021-09-05 ENCOUNTER — Other Ambulatory Visit: Payer: Self-pay

## 2021-09-05 ENCOUNTER — Ambulatory Visit
Admission: RE | Admit: 2021-09-05 | Discharge: 2021-09-05 | Disposition: A | Discharge status: Home / Self Care | Payer: 59 | Source: Ambulatory Visit | Attending: Family Medicine | Admitting: Family Medicine

## 2021-09-05 DIAGNOSIS — N2889 Other specified disorders of kidney and ureter: Secondary | ICD-10-CM

## 2021-09-05 DIAGNOSIS — N289 Disorder of kidney and ureter, unspecified: Secondary | ICD-10-CM

## 2021-09-05 MED ORDER — GADOBENATE DIMEGLUMINE 529 MG/ML IV SOLN
20.0000 mL | Freq: Once | INTRAVENOUS | Status: AC | PRN
  Administered 2021-09-05: 13:00:00 20 mL via INTRAVENOUS

## 2021-09-08 ENCOUNTER — Telehealth: Payer: Self-pay | Admitting: Family Medicine

## 2021-09-08 DIAGNOSIS — N281 Cyst of kidney, acquired: Secondary | ICD-10-CM

## 2021-09-08 NOTE — Telephone Encounter (Signed)
See MRI report.  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  Current patient of Dr. [name], referral placed.

## 2021-09-11 ENCOUNTER — Encounter: Payer: Self-pay | Admitting: Dermatology

## 2021-09-11 NOTE — Progress Notes (Signed)
° °  Follow-Up Visit   Subjective  [name] is a 70 y.o. male who presents for the following: Skin Problem (Here for a knot on back and chest. Possible cyst. ).  Growths on torso, spots on her legs Location:  Duration:  Quality:  Associated Signs/Symptoms: Modifying Factors:  Severity:  Timing: Context:   Objective  Well appearing patient in no apparent distress; mood and affect are within normal limits. Mid Back Soft 2 cm subcutaneous noninflamed nodule  Mid Back Noninflamed 1 cm deep dermal nodule  Chest - Medial (Center) 1 cm asymptomatic keloid and  Left Ankle - Anterior, Right Ankle - Anterior Hyperpigmented slightly lichenoid 4 to 6 mm flattopped papules    All skin waist up examined.  Plus legs   Assessment & Plan    Lipoma of torso Mid Back  Benign, ok to leave unless patient wants removed or clinically changes  Sebaceous cyst Mid Back  Benign. Ok to leave unless patient wants removed  Keloid Chest - Medial [facility])  Discussed a series of injections but for now treatment deferred  Lichen planus Left Ankle - Anterior; Right Ankle - Anterior  May use topical clobetasol daily for 1 to 2 months after bathing  Related Medications Clobetasol Prop Emollient Base (CLOBETASOL PROPIONATE E) 0.05 % emollient cream Apply 1 application topically at bedtime.      I, Lavonna Monarch, MD, have reviewed all documentation for this visit.  The documentation on 09/11/21 for the exam, diagnosis, procedures, and orders are all accurate and complete.

## 2021-09-26 ENCOUNTER — Ambulatory Visit (INDEPENDENT_AMBULATORY_CARE_PROVIDER_SITE_OTHER): Payer: 59 | Admitting: Podiatry

## 2021-09-26 ENCOUNTER — Other Ambulatory Visit: Payer: Self-pay

## 2021-09-26 DIAGNOSIS — M2042 Other hammer toe(s) (acquired), left foot: Secondary | ICD-10-CM

## 2021-09-26 DIAGNOSIS — M778 Other enthesopathies, not elsewhere classified: Secondary | ICD-10-CM | POA: Diagnosis not present

## 2021-09-26 MED ORDER — TRIAMCINOLONE ACETONIDE 10 MG/ML IJ SUSP
10.0000 mg | Freq: Once | INTRAMUSCULAR | Status: AC
  Administered 2021-09-26: 10 mg

## 2021-09-28 NOTE — Progress Notes (Signed)
Subjective: [name] is a 70 y.o. is seen today in office for follow-up evaluation of right foot pain.  He states that he has been in pain to the ankle again is requesting another steroid injection.  He has no pain on surgical site.  He has another concern of calluses left third toe which is causing pain.  No swelling redness or drainage or any other concerns today.   Objective: General: No acute distress, AAOx3  DP/PT pulses palpable 2/4, CRT < 3 sec to all digits.  Protective sensation intact. Motor function intact.  RIGHT foot: Incision is well coapted without any evidence of dehiscence scar is well formed.  No pain on surgical site arthrodesis site is stable.  Tenderness palpation along the sinus tarsi of the lateral aspect the foot.  Trace edema there is no erythema or warmth.  No pain with range of motion.  No other areas of discomfort.  No pain with ankle joint range of motion. LEFT foot: Moderate bunion is present mild tenderness palpation on the bunion site.  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  Upon debridement there is no underlying ulceration drainage or any signs of infection.   No other open lesions or pre-ulcerative lesions.  No pain with calf compression, swelling, warmth, erythema.   Assessment and Plan:  Right foot capsulitis, left foot hammertoe resulted in painful hyperkeratotic lesion  -Treatment options discussed including all alternatives, risks, and complications -For the right foot steroid injection was performed.  The skin was Prepped with Betadine, alcohol and a mixture of 1 cc Kenalog 10, 0.5 cc of lidocaine plain, 0.5 cc of Marcaine plain was infiltrated into the sinus tarsi without complications.  Postinjection care was discussed.  Tolerated procedure well any complications. -Debrided hyperkeratotic tissue with any complications including the left third toe.  Discussed hammertoe discussed  hammertoe surgery.  Discussed conservative care versus surgery he was to proceed with conservative care for now but discussed surgical options in the future if symptoms continue or worsen.  Trula Slade DPM

## 2021-10-04 ENCOUNTER — Other Ambulatory Visit: Payer: Self-pay | Admitting: Emergency Medicine

## 2021-11-09 ENCOUNTER — Other Ambulatory Visit: Payer: Self-pay | Admitting: Emergency Medicine

## 2021-12-09 ENCOUNTER — Other Ambulatory Visit: Payer: Self-pay | Admitting: Emergency Medicine

## 2022-01-16 ENCOUNTER — Other Ambulatory Visit: Payer: Self-pay | Admitting: Emergency Medicine

## 2022-02-20 ENCOUNTER — Other Ambulatory Visit: Payer: Self-pay | Admitting: Family Medicine

## 2022-02-20 DIAGNOSIS — I10 Essential (primary) hypertension: Secondary | ICD-10-CM

## 2022-02-27 ENCOUNTER — Other Ambulatory Visit: Payer: Self-pay | Admitting: Family Medicine

## 2022-03-13 ENCOUNTER — Ambulatory Visit: Payer: 59 | Admitting: Podiatry

## 2022-03-13 ENCOUNTER — Other Ambulatory Visit: Payer: Self-pay | Admitting: Family Medicine

## 2022-03-13 DIAGNOSIS — L84 Corns and callosities: Secondary | ICD-10-CM | POA: Diagnosis not present

## 2022-03-13 DIAGNOSIS — M2042 Other hammer toe(s) (acquired), left foot: Secondary | ICD-10-CM | POA: Diagnosis not present

## 2022-03-13 DIAGNOSIS — Z79899 Other long term (current) drug therapy: Secondary | ICD-10-CM

## 2022-03-13 DIAGNOSIS — B351 Tinea unguium: Secondary | ICD-10-CM

## 2022-03-13 DIAGNOSIS — M778 Other enthesopathies, not elsewhere classified: Secondary | ICD-10-CM

## 2022-03-13 DIAGNOSIS — I10 Essential (primary) hypertension: Secondary | ICD-10-CM

## 2022-03-13 DIAGNOSIS — M21612 Bunion of left foot: Secondary | ICD-10-CM | POA: Diagnosis not present

## 2022-03-15 ENCOUNTER — Other Ambulatory Visit: Payer: Self-pay | Admitting: Podiatry

## 2022-03-15 DIAGNOSIS — Z79899 Other long term (current) drug therapy: Secondary | ICD-10-CM

## 2022-03-15 LAB — CBC WITH DIFFERENTIAL/PLATELET
Absolute Monocytes: 660 cells/uL (ref 200–950)
Basophils Absolute: 72 cells/uL (ref 0–200)
Basophils Relative: 1.2 %
Eosinophils Absolute: 270 cells/uL (ref 15–500)
Eosinophils Relative: 4.5 %
HCT: 49.6 % (ref 38.5–50.0)
Hemoglobin: 16.4 g/dL (ref 13.2–17.1)
Lymphs Abs: 1956 cells/uL (ref 850–3900)
MCH: 30 pg (ref 27.0–33.0)
MCHC: 33.1 g/dL (ref 32.0–36.0)
MCV: 90.8 fL (ref 80.0–100.0)
MPV: 11.6 fL (ref 7.5–12.5)
Monocytes Relative: 11 %
Neutro Abs: 3042 cells/uL (ref 1500–7800)
Neutrophils Relative %: 50.7 %
Platelets: 285 10*3/uL (ref 140–400)
RBC: 5.46 10*6/uL (ref 4.20–5.80)
RDW: 12.8 % (ref 11.0–15.0)
Total Lymphocyte: 32.6 %
WBC: 6 10*3/uL (ref 3.8–10.8)

## 2022-03-15 LAB — HEPATIC FUNCTION PANEL
AG Ratio: 2.1 (calc) (ref 1.0–2.5)
ALT: 27 U/L (ref 9–46)
AST: 20 U/L (ref 10–35)
Albumin: 4.7 g/dL (ref 3.6–5.1)
Alkaline phosphatase (APISO): 92 U/L (ref 35–144)
Bilirubin, Direct: 0.1 mg/dL (ref 0.0–0.2)
Globulin: 2.2 g/dL (calc) (ref 1.9–3.7)
Indirect Bilirubin: 0.3 mg/dL (calc) (ref 0.2–1.2)
Total Bilirubin: 0.4 mg/dL (ref 0.2–1.2)
Total Protein: 6.9 g/dL (ref 6.1–8.1)

## 2022-03-15 MED ORDER — TERBINAFINE HCL 250 MG PO TABS: 250.0000 mg | ORAL_TABLET | Freq: Every day | ORAL | 0 refills | Status: DC

## 2022-03-19 NOTE — Progress Notes (Signed)
Subjective: [name] is a 71 y.o. is seen today in office for follow-up evaluation of right foot pain as well as for calluses on the left foot.  Right foot is been doing well.  He complains starting to come back but not where he needs an injection at this time.  Surgical sites been doing well with any problems.  The left foot he gets calluses at the tip of the second and third toes causing discomfort.  No open lesions that he reports.  No injuries that he reports.  Also concerned about his nails becoming thick and discolored.  He has no pain in the nails and no swelling redness or any drainage.  No other concerns.  Objective: General: No acute distress, AAOx3  DP/PT pulses palpable 2/4, CRT < 3 sec to all digits.  Protective sensation intact. Motor function intact.  RIGHT foot: Incision is well coapted without any evidence of dehiscence scar is well formed.  Mild discomfort on sinus tarsi but no area pinpoint tenderness.  No pain with ankle instability range of motion. LEFT foot: Moderate bunion is present mild tenderness palpation on the bunion site.  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  Upon debridement there is no underlying ulceration drainage or any signs of infection.  Bunion present. Nails are hypertrophic, dystrophic with yellow, brown discoloration.  No edema, erythema or signs of infection.  No pain. No other open lesions or pre-ulcerative lesions.  No pain with calf compression, swelling, warmth, erythema.   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  Continue supportive shoe gear. -Debrided hyperkeratotic tissue with any complications including the left second and third toe.  Discussed hammertoe discussed hammertoe  surgery.  Discussed conservative care versus surgery he was to proceed with conservative care for now but discussed surgical options in the future if symptoms continue or worsen. -Discussed treatment options for nail fungus.  Discussed oral, topical as well as alternative treatments.  Was proceed with oral Lamisil.  Discussed side effects.  We will check a CBC and LFT prior to starting medication.  Trula Slade DPM

## 2022-05-06 ENCOUNTER — Other Ambulatory Visit: Payer: Self-pay | Admitting: Family Medicine

## 2022-05-06 DIAGNOSIS — E785 Hyperlipidemia, unspecified: Secondary | ICD-10-CM

## 2022-05-15 ENCOUNTER — Ambulatory Visit: Payer: 59 | Admitting: Podiatry

## 2022-05-15 DIAGNOSIS — B351 Tinea unguium: Secondary | ICD-10-CM

## 2022-05-15 DIAGNOSIS — M2042 Other hammer toe(s) (acquired), left foot: Secondary | ICD-10-CM | POA: Diagnosis not present

## 2022-05-15 DIAGNOSIS — L84 Corns and callosities: Secondary | ICD-10-CM | POA: Diagnosis not present

## 2022-05-15 DIAGNOSIS — M722 Plantar fascial fibromatosis: Secondary | ICD-10-CM | POA: Diagnosis not present

## 2022-05-15 MED ORDER — MELOXICAM 15 MG PO TABS: 15.0000 mg | ORAL_TABLET | Freq: Every day | ORAL | 0 refills | Status: DC | PRN

## 2022-05-15 MED ORDER — TRIAMCINOLONE ACETONIDE 10 MG/ML IJ SUSP: 10.0000 mg | Freq: Once | INTRAMUSCULAR | Status: DC

## 2022-05-15 NOTE — Progress Notes (Unsigned)
Subjective: Chief Complaint  Patient presents with   Plantar Fasciitis    Patient stated he is having heel pain again that is getting worse since last visit.    Nail Problem    Patient reports taking lamisil Rx as directed and can see noticeable improvement since starting the medication.    71 year old male presents the office with concerns of left heel pain which has been getting worse.  No recent injury or trauma that he reports.  Describes pain in the morning after sitting after being on his feet all day.  He works at [employer].  No present swelling.  Also he has been taking the Lamisil as prescribed for the nail fungus he has seen improvement with this.  The callus on the left third toe is still painful but seems to be getting better.  He wants to hold off on surgery of the left foot for now.  Objective: AAO x3, NAD DP/PT pulses palpable bilaterally, CRT less than 3 seconds Tenderness to palpation along the plantar medial tubercle of the calcaneus at the insertion of plantar fascia on the right foot. There is no pain along the course of the plantar fascia within the arch of the foot. Plantar fascia appears to be intact. There is no pain with lateral compression of the calcaneus or pain with vibratory sensation. There is no pain along the course or insertion of the achilles tendon. No other areas of tenderness to bilateral lower extremities. Hyperkeratotic lesion noted the distal aspect left third toe due to hammertoe deformity.  There is no underlying ulceration drainage or signs of infection. The nails appear more more clear in nature and seem to be growing out.  There is no drainage or pus or any pain to the toenail sites. No pain with calf compression, swelling, warmth, erythema  Assessment:  Plan: -All treatment options discussed with the patient including all alternatives, risks, complications.  -Injection for the right foot.  See procedure note below. -Plantar fascia brace dispensed  help support stabilize the plantar fascia -He wants to hold off on night splint. -Continue orthotics, supportive shoes -Tolerating Lamisil well.  Continue monitoring side effects.  Printed lab requisition for him to get updated blood work. -3 hyperkeratotic lesion left third toe given hammertoe.  Discussed conservative as well as surgical options.  Was able to open surgery.  Continue conservative care for now.  Offloading. -Patient encouraged to call the office with any questions, concerns, change in symptoms.   Procedure: Injection Tendon/Ligament Discussed alternatives, risks, complications and verbal consent was obtained.  Location: Right plantar fascia at the glabrous junction; medial approach. Skin Prep: Alcohol. Injectate: 0.5cc 0.5% marcaine plain, 0.5 cc 2% lidocaine plain and, 1 cc kenalog 10. Disposition: Patient tolerated procedure well. Injection site dressed with a band-aid.  Post-injection care was discussed and return precautions discussed.   Return in about 6 weeks (around 06/26/2022), or if symptoms worsen or fail to improve.Trula Slade DPM

## 2022-05-15 NOTE — Patient Instructions (Signed)
For instructions on how to put on your Plantar Fascial Brace, please visit [email or web address]  --  While at your visit today you received a steroid injection in your foot or ankle to help with your pain. Along with having the steroid medication there is some "numbing" medication in the shot that you received. Due to this you may notice some numbness to the area for the next couple of hours.   I would recommend limiting activity for the next few days to help the steroid injection take affect.    The actually benefit from the steroid injection may take up to 2-7 days to see a difference. You may actually experience a small (as in 10%) INCREASE in pain in the first 24 hours---that is common. It would be best if you can ice the area today and take anti-inflammatory medications (such as Ibuprofen, Motrin, or Aleve) if you are able to take these medications. If you were prescribed another medication to help with the pain go ahead and start that medication today    Things to watch out for that you should contact us or a health care provider urgently would include: 1. Unusual (as in more than 10%) increase in pain 2. New fever > 101.5 3. New swelling or redness of the injected area.  4. Streaking of red lines around the area injected.  If you have any questions or concerns about this, please give our office a call at [phone].    ----   Plantar Fasciitis (Heel Spur Syndrome) with Rehab The plantar fascia is a fibrous, ligament-like, soft-tissue structure that spans the bottom of the foot. Plantar fasciitis is a condition that causes pain in the foot due to inflammation of the tissue. SYMPTOMS  Pain and tenderness on the underneath side of the foot. Pain that worsens with standing or walking. CAUSES  Plantar fasciitis is caused by irritation and injury to the plantar fascia on the underneath side of the foot. Common mechanisms of injury include: Direct trauma to bottom of the  foot. Damage to a small nerve that runs under the foot where the main fascia attaches to the heel bone. Stress placed on the plantar fascia due to bone spurs. RISK INCREASES WITH:  Activities that place stress on the plantar fascia (running, jumping, pivoting, or cutting). Poor strength and flexibility. Improperly fitted shoes. Tight calf muscles. Flat feet. Failure to warm-up properly before activity. Obesity. PREVENTION Warm up and stretch properly before activity. Allow for adequate recovery between workouts. Maintain physical fitness: Strength, flexibility, and endurance. Cardiovascular fitness. Maintain a health body weight. Avoid stress on the plantar fascia. Wear properly fitted shoes, including arch supports for individuals who have flat feet.  PROGNOSIS  If treated properly, then the symptoms of plantar fasciitis usually resolve without surgery. However, occasionally surgery is necessary.  RELATED COMPLICATIONS  Recurrent symptoms that may result in a chronic condition. Problems of the lower back that are caused by compensating for the injury, such as limping. Pain or weakness of the foot during push-off following surgery. Chronic inflammation, scarring, and partial or complete fascia tear, occurring more often from repeated injections.  TREATMENT  Treatment initially involves the use of ice and medication to help reduce pain and inflammation. The use of strengthening and stretching exercises may help reduce pain with activity, especially stretches of the Achilles tendon. These exercises may be performed at home or with a therapist. Your caregiver may recommend that you use heel cups of arch supports to help reduce stress on   the plantar fascia. Occasionally, corticosteroid injections are given to reduce inflammation. If symptoms persist for greater than 6 months despite non-surgical (conservative), then surgery may be recommended.   MEDICATION  If pain medication is  necessary, then nonsteroidal anti-inflammatory medications, such as aspirin and ibuprofen, or other minor pain relievers, such as acetaminophen, are often recommended. Do not take pain medication within 7 days before surgery. Prescription pain relievers may be given if deemed necessary by your caregiver. Use only as directed and only as much as you need. Corticosteroid injections may be given by your caregiver. These injections should be reserved for the most serious cases, because they may only be given a certain number of times.  HEAT AND COLD Cold treatment (icing) relieves pain and reduces inflammation. Cold treatment should be applied for 10 to 15 minutes every 2 to 3 hours for inflammation and pain and immediately after any activity that aggravates your symptoms. Use ice packs or massage the area with a piece of ice (ice massage). Heat treatment may be used prior to performing the stretching and strengthening activities prescribed by your caregiver, physical therapist, or athletic trainer. Use a heat pack or soak the injury in warm water.  SEEK IMMEDIATE MEDICAL CARE IF: Treatment seems to offer no benefit, or the condition worsens. Any medications produce adverse side effects.  EXERCISES- RANGE OF MOTION (ROM) AND STRETCHING EXERCISES - Plantar Fasciitis (Heel Spur Syndrome) These exercises may help you when beginning to rehabilitate your injury. Your symptoms may resolve with or without further involvement from your physician, physical therapist or athletic trainer. While completing these exercises, remember:  Restoring tissue flexibility helps normal motion to return to the joints. This allows healthier, less painful movement and activity. An effective stretch should be held for at least 30 seconds. A stretch should never be painful. You should only feel a gentle lengthening or release in the stretched tissue.  RANGE OF MOTION - Toe Extension, Flexion Sit with your right / left leg  crossed over your opposite knee. Grasp your toes and gently pull them back toward the top of your foot. You should feel a stretch on the bottom of your toes and/or foot. Hold this stretch for 10 seconds. Now, gently pull your toes toward the bottom of your foot. You should feel a stretch on the top of your toes and or foot. Hold this stretch for 10 seconds. Repeat  times. Complete this stretch 3 times per day.   RANGE OF MOTION - Ankle Dorsiflexion, Active Assisted Remove shoes and sit on a chair that is preferably not on a carpeted surface. Place right / left foot under knee. Extend your opposite leg for support. Keeping your heel down, slide your right / left foot back toward the chair until you feel a stretch at your ankle or calf. If you do not feel a stretch, slide your bottom forward to the edge of the chair, while still keeping your heel down. Hold this stretch for 10 seconds. Repeat 3 times. Complete this stretch 2 times per day.   STRETCH  Gastroc, Standing Place hands on wall. Extend right / left leg, keeping the front knee somewhat bent. Slightly point your toes inward on your back foot. Keeping your right / left heel on the floor and your knee straight, shift your weight toward the wall, not allowing your back to arch. You should feel a gentle stretch in the right / left calf. Hold this position for 10 seconds. Repeat 3 times. Complete this   stretch 2 times per day.  STRETCH  Soleus, Standing Place hands on wall. Extend right / left leg, keeping the other knee somewhat bent. Slightly point your toes inward on your back foot. Keep your right / left heel on the floor, bend your back knee, and slightly shift your weight over the back leg so that you feel a gentle stretch deep in your back calf. Hold this position for 10 seconds. Repeat 3 times. Complete this stretch 2 times per day.  STRETCH  Gastrocsoleus, Standing  Note: This exercise can place a lot of stress on your foot  and ankle. Please complete this exercise only if specifically instructed by your caregiver.  Place the ball of your right / left foot on a step, keeping your other foot firmly on the same step. Hold on to the wall or a rail for balance. Slowly lift your other foot, allowing your body weight to press your heel down over the edge of the step. You should feel a stretch in your right / left calf. Hold this position for 10 seconds. Repeat this exercise with a slight bend in your right / left knee. Repeat 3 times. Complete this stretch 2 times per day.   STRENGTHENING EXERCISES - Plantar Fasciitis (Heel Spur Syndrome)  These exercises may help you when beginning to rehabilitate your injury. They may resolve your symptoms with or without further involvement from your physician, physical therapist or athletic trainer. While completing these exercises, remember:  Muscles can gain both the endurance and the strength needed for everyday activities through controlled exercises. Complete these exercises as instructed by your physician, physical therapist or athletic trainer. Progress the resistance and repetitions only as guided.  STRENGTH - Towel Curls Sit in a chair positioned on a non-carpeted surface. Place your foot on a towel, keeping your heel on the floor. Pull the towel toward your heel by only curling your toes. Keep your heel on the floor. Repeat 3 times. Complete this exercise 2 times per day.  STRENGTH - Ankle Inversion Secure one end of a rubber exercise band/tubing to a fixed object (table, pole). Loop the other end around your foot just before your toes. Place your fists between your knees. This will focus your strengthening at your ankle. Slowly, pull your big toe up and in, making sure the band/tubing is positioned to resist the entire motion. Hold this position for 10 seconds. Have your muscles resist the band/tubing as it slowly pulls your foot back to the starting position. Repeat 3  times. Complete this exercises 2 times per day.  Document Released: 09/04/2005 Document Revised: 11/27/2011 Document Reviewed: 12/17/2008 [employer] Patient Information 2014 [employer].  

## 2022-05-23 ENCOUNTER — Other Ambulatory Visit: Payer: Self-pay | Admitting: Family Medicine

## 2022-05-23 DIAGNOSIS — I10 Essential (primary) hypertension: Secondary | ICD-10-CM

## 2022-06-12 ENCOUNTER — Other Ambulatory Visit: Payer: Self-pay | Admitting: Podiatry

## 2022-06-12 ENCOUNTER — Other Ambulatory Visit: Payer: Self-pay | Admitting: Family Medicine

## 2022-06-12 ENCOUNTER — Encounter: Payer: Self-pay | Admitting: Podiatry

## 2022-06-12 DIAGNOSIS — J309 Allergic rhinitis, unspecified: Secondary | ICD-10-CM

## 2022-06-14 ENCOUNTER — Other Ambulatory Visit: Payer: Self-pay | Admitting: Podiatry

## 2022-06-14 MED ORDER — EFINACONAZOLE 10 % EX SOLN
1.0000 [drp] | Freq: Every day | CUTANEOUS | 11 refills | Status: DC
Start: 2022-06-14 — End: 2022-06-15

## 2022-06-15 ENCOUNTER — Other Ambulatory Visit: Payer: Self-pay | Admitting: Podiatry

## 2022-06-15 MED ORDER — TAVABOROLE 5 % EX SOLN: 1.0000 [drp] | Freq: Every day | CUTANEOUS | 2 refills | Status: AC

## 2022-06-15 NOTE — Progress Notes (Signed)
Received notification that jublia not covered, sent kerydin.

## 2022-06-26 ENCOUNTER — Other Ambulatory Visit: Payer: Self-pay | Admitting: Family Medicine

## 2022-06-26 DIAGNOSIS — I10 Essential (primary) hypertension: Secondary | ICD-10-CM

## 2022-07-03 ENCOUNTER — Ambulatory Visit: Payer: 59 | Admitting: Podiatry

## 2022-07-03 DIAGNOSIS — M722 Plantar fascial fibromatosis: Secondary | ICD-10-CM

## 2022-07-03 DIAGNOSIS — M2042 Other hammer toe(s) (acquired), left foot: Secondary | ICD-10-CM

## 2022-07-03 DIAGNOSIS — L84 Corns and callosities: Secondary | ICD-10-CM | POA: Diagnosis not present

## 2022-07-03 MED ORDER — MELOXICAM 15 MG PO TABS: 15.0000 mg | ORAL_TABLET | Freq: Every day | ORAL | 0 refills | Status: DC | PRN

## 2022-07-03 NOTE — Progress Notes (Unsigned)
Subjective: Chief Complaint  Patient presents with   Plantar Fasciitis    Right foot plantar Fasciitis, doing better than the last visit, but patient still has some pain, Nail fungus, doing better, [place]: Insoles, topical antifungal, mobic, Callus on the right forefoot      71 year old male presents the office with concerns of left heel pain which has been getting worse.  States that he is doing better.  Still has some discomfort but not nearly the what it was.  No recent injury or changes.  He is on topical medication for nail on this.  Still gets the callus on the left third toe is still painful at times.  He wants to hold off on surgery of the left foot for now.  Objective: AAO x3, NAD DP/PT pulses palpable bilaterally, CRT less than 3 seconds There is minimal tenderness to palpation along the plantar medial tubercle of the calcaneus at the insertion of plantar fascia on the right foot. There is no pain along the course of the plantar fascia within the arch of the foot. Plantar fascia appears to be intact. There is no pain with lateral compression of the calcaneus or pain with vibratory sensation. There is no pain along the course or insertion of the achilles tendon. No other areas of tenderness to bilateral lower extremities. Hyperkeratotic lesion noted the distal aspect left third toe due to hammertoe deformity.  There is no underlying ulceration drainage or signs of infection. New lesion right foot submetatarsal area which was a callus.  No ulceration or signs of infection. The nails appear more more clear in nature and seem to be growing out.  There is no drainage or pus or any pain to the toenail sites. No pain with calf compression, swelling, warmth, erythema  Assessment: 71 year old male with plantar fasciitis, callus formation  Plan: -All treatment options discussed with the patient including all alternatives, risks, complications.  -Regards to the heel pain we held off on an  injection today.  Continue stretching, icing as well as using good arch support.  Added metatarsal pad to the orthotic. -Continue topical medication for nail fungus -Sharply debrided the hyperkeratotic lesion without any complications or bleeding the left third toe.  This is from the hammertoe.  Discussed conservative as well as surgical options.  Was able to open surgery.  Continue conservative care for now.  Offloading. -Patient encouraged to call the office with any questions, concerns, change in symptoms.   Trula Slade DPM

## 2022-07-13 ENCOUNTER — Other Ambulatory Visit: Payer: Self-pay | Admitting: Family Medicine

## 2022-07-24 ENCOUNTER — Encounter: Payer: Self-pay | Admitting: Family Medicine

## 2022-07-24 ENCOUNTER — Ambulatory Visit: Payer: 59 | Admitting: Family Medicine

## 2022-07-24 VITALS — BP 138/78 | HR 88 | Temp 98.5°F | Ht 72.0 in | Wt 228.8 lb

## 2022-07-24 DIAGNOSIS — I10 Essential (primary) hypertension: Secondary | ICD-10-CM

## 2022-07-24 DIAGNOSIS — E785 Hyperlipidemia, unspecified: Secondary | ICD-10-CM

## 2022-07-24 DIAGNOSIS — R12 Heartburn: Secondary | ICD-10-CM

## 2022-07-24 MED ORDER — ESOMEPRAZOLE MAGNESIUM 20 MG PO CPDR: 20.0000 mg | DELAYED_RELEASE_CAPSULE | Freq: Every day | ORAL | 3 refills | Status: DC

## 2022-07-24 MED ORDER — PRAVASTATIN SODIUM 20 MG PO TABS: 20.0000 mg | ORAL_TABLET | Freq: Every day | ORAL | 3 refills | Status: DC

## 2022-07-24 MED ORDER — AMLODIPINE BESYLATE 10 MG PO TABS: 10.0000 mg | ORAL_TABLET | Freq: Every day | ORAL | 3 refills | Status: DC

## 2022-07-24 NOTE — Progress Notes (Unsigned)
Subjective:  Patient ID: [name], male    DOB: 1951-01-01  Age: 71 y.o. MRN: [id number]  CC:  Chief Complaint  Patient presents with   Hyperlipidemia   Hypertension    Pt states all is well    HPI [name] presents for  Follow up.  Still working part time at [employer]  Hypertension: Amlodipine 7.5 mg daily. No  new side effects.  Home readings: BP Readings from Last 3 Encounters:  07/24/22 138/78  09/02/21 (!) 146/84  07/11/21 130/74   Lab Results  Component Value Date   CREATININE 0.79 07/11/2021   Hyperlipidemia: Pravastatin 20 mg daily,  no new side effects. No myalgias.  Less exercise recently - used to walk dog - had to put dog down few weeks ago.  Lab Results  Component Value Date   CHOL 143 07/11/2021   HDL 35.00 (L) 07/11/2021   LDLCALC 95 07/11/2021   TRIG 65.0 07/11/2021   CHOLHDL 4 07/11/2021   Lab Results  Component Value Date   ALT 27 03/14/2022   AST 20 03/14/2022   ALKPHOS 86 07/11/2021   BILITOT 0.4 03/14/2022   Nexium working well for heartburn.   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   Pulmonary nodules 02/23/2014   Tobacco use disorder, moderate, in sustained remission 02/23/2014   Recurrent right inguinal hernia 10/20/2013   ED (erectile dysfunction) 08/26/2012   Physical exam, annual 11/05/2011   HTN (hypertension) 11/05/2011   GERD (gastroesophageal reflux disease) 11/05/2011   Chronic joint pain 11/05/2011   Past Medical History:  Diagnosis Date   Allergy    seasonal   Arthritis    oa    Asthma    COPD (chronic obstructive pulmonary disease) ([place])    GERD (gastroesophageal reflux disease)    Hypertension    Prostate cancer ([place]) 01/2016   prostatectomy    Shortness of breath dyspnea    Past Surgical History:  Procedure Laterality Date   HERNIA REPAIR  last done 3-4 yrs ago   x 2   INGUINAL HERNIA REPAIR Right 11/20/2013   Procedure: OPEN REPAIR OF RECURRENT RIGHT INGUINAL HERNIA WITH MESH;  Surgeon: [name]. [name], MD;  Location: [facility];  Service: General;  Laterality: Right;   INSERTION OF MESH Right 11/20/2013   Procedure: INSERTION OF MESH;  Surgeon: [name]. [name], MD;  Location: [facility];  Service: General;  Laterality: Right;   KNEE SURGERY Left july 2014   mcl and meniscus repair   LYMPHADENECTOMY Bilateral 02/09/2016   Procedure: PELVIC LYMPHADENECTOMY;  Surgeon: [name], MD;  Location: [facility];  Service: Urology;  Laterality: Bilateral;   right knee arthroscopy      ROBOT ASSISTED LAPAROSCOPIC RADICAL PROSTATECTOMY N/A 02/09/2016   Procedure: XI ROBOTIC ASSISTED LAPAROSCOPIC RADICAL PROSTATECTOMY WITH INDOCYANINE GREEN DYE AND ADHESIOLYSIS;  Surgeon: [name], MD;  Location: [facility];  Service: Urology;  Laterality: N/A;   TOTAL KNEE ARTHROPLASTY Right 09/06/2015   Procedure: RIGHT TOTAL KNEE ARTHROPLASTY;  Surgeon: [name], MD;  Location:  [facility];  Service: Orthopedics;  Laterality: Right;   TOTAL KNEE ARTHROPLASTY Left 06/17/2019   Procedure: TOTAL KNEE ARTHROPLASTY;  Surgeon: [name], MD;  Location: [facility];  Service: Orthopedics;  Laterality: Left;  70 mins   Allergies  Allergen Reactions   Percocet [Oxycodone-Acetaminophen] Other (See Comments)    Nausea-Doesn't work   Amoxicillin Rash    Has patient had a PCN reaction causing immediate rash, facial/tongue/throat swelling, SOB or lightheadedness with hypotension: No Has patient had a PCN reaction causing severe rash involving mucus membranes or skin necrosis: No Has patient had a PCN  reaction that required hospitalization No Has patient had a PCN reaction occurring within the last 10 years: Yes- 2016 If all of the above answers are "NO", then may proceed with Cephalosporin use.    Prior to Admission medications   Medication Sig Start Date End Date Taking? Authorizing Provider  albuterol (PROVENTIL HFA) 108 (90 Base) MCG/ACT inhaler Proventil HFA 90 mcg/actuation aerosol inhaler   2 puffs by inhalation route every 4-6 hours as needed. 09/15/20  Yes Collene Gobble, MD  amLODipine (NORVASC) 2.5 MG tablet TAKE ONE TABLET BY MOUTH ONE TIME DAILY 05/23/22  Yes [name], MD  amLODipine (NORVASC) 5 MG tablet TAKE ONE TABLET BY MOUTH ONE TIME DAILY COMBINED WITH 2.5MG FOR A TOTAL OF 7.5MG 06/26/22  Yes [name], MD  ANORO ELLIPTA 62.5-25 MCG/INH AEPB INHALE ONE PUFF BY MOUTH INTO THE LUNGS ONE TIME DAILY 10/20/20  Yes Collene Gobble, MD  Cholecalciferol (VITAMIN D3) 50 MCG (2000 UT) TABS Take 2,000 Units by mouth daily.   Yes [provider]  clobetasol cream (TEMOVATE) 0.05 % APPLY TOPICALLY TO AFFECTED AREA(S) NIGHTLY AT BEDTIME 08/30/20  Yes Lavonna Monarch, MD  Clobetasol Prop Emollient Base (CLOBETASOL PROPIONATE E) 0.05 % emollient cream Apply 1 application topically at bedtime. 08/22/21  Yes Lavonna Monarch, MD  cyclobenzaprine (FLEXERIL) 5 MG tablet take one tablet by mouth three times daily as needed for muscle spasm. start at bedtime due to sedation 01/12/20  Yes [name], MD  fluconazole (DIFLUCAN) 150 MG tablet Take 1 tablet (150 mg total) by mouth daily. Take 343m on day 1, then 1516monce per day. 06/22/21  Yes Burns, StClaudina LickMD  HYDROcodone-acetaminophen (NORCO/VICODIN) 5-325 MG tablet Take 1 tablet by mouth every 6 (six) hours as needed. 02/21/21  Yes WaTrula SladeDPM  INCRUSE ELLIPTA 62.5 MCG/INH AEPB INHALE ONE PUFF BY MOUTH INTO THE LUNGS ONCE DAILY 02/24/20  Yes Byrum, RoRose FillersMD  KLS ALLER-FLO 50 MCG/ACT nasal spray PLACE TWO  SPRAYS INTO BOTH NOSTRILS ONCE A DAY 06/12/22  Yes GrWendie AgresteMD  KLS ESOMEPRAZOLE MAGNESIUM 20 MG capsule TAKE TWO CAPSULES BY MOUTH DAILY 02/27/22  Yes GrWendie AgresteMD  meloxicam (MOBIC) 15 MG tablet Take 1 tablet (15 mg total) by mouth daily as needed for pain. 07/03/22 07/03/23 Yes WaTrula SladeDPM  Menthol-Methyl Salicylate (SALONPAS PAIN RELIEF PATCH EX) Place 1 patch onto the skin daily as needed (pain.).   Yes [provider]  methocarbamol (ROBAXIN) 500 MG tablet Take 500 mg by mouth 3 (three) times daily. 08/02/21  Yes [provider]  pravastatin (PRAVACHOL) 20 MG tablet TAKE ONE TABLET BY MOUTH ONE TIME DAILY 05/08/22  Yes GrWendie AgresteMD  Tavaborole (KERYDIN) 5 % SOLN Apply 1 drop topically daily. Apply 1 drop to the toenail daily. 06/15/22  Yes WaTrula SladeDPM  terbinafine (LAMISIL) 250 MG tablet Take 1 tablet (250 mg total) by mouth daily. 03/15/22  Yes Trula Slade, DPM  triamcinolone cream (KENALOG) 0.1 % Apply 1 application topically 2 (two) times daily as needed. To affected areas only as needed. Patient taking differently: Apply 1 application  topically 2 (two) times daily as needed (rash). 01/08/19  Yes [name], MD  umeclidinium-vilanterol [facility] ELLIPTA) 62.5-25 MCG/ACT AEPB INHALE 1 PUFF INTO THE LUNGS BY MOUTH ONCE A DAY 01/18/22  Yes Byrum, Rose Fillers, MD  azelastine (OPTIVAR) 0.05 % ophthalmic solution Place 1 drop into both eyes 2 (two) times daily as needed. For itching/allergy symptoms Patient not taking: Reported on 07/24/2022 07/14/19   [name], MD  celecoxib (CELEBREX) 200 MG capsule Take 1 capsule (200 mg total) by mouth 2 (two) times daily. Patient not taking: Reported on 07/24/2022 06/18/19   Irving Copas, PA-C  clindamycin (CLEOCIN) 300 MG capsule Take 1 capsule (300 mg total) by mouth 3 (three) times daily. Patient not taking: Reported on 07/24/2022 01/12/21   Trula Slade, DPM  FLOWFLEX  COVID-19 [place] TEST KIT  11/09/20   [provider]  promethazine (PHENERGAN) 25 MG tablet Take 1 tablet (25 mg total) by mouth every 8 (eight) hours as needed for nausea or vomiting. Patient not taking: Reported on 07/24/2022 01/12/21   Trula Slade, DPM   Social History   Socioeconomic History   Marital status: Married    Spouse name: Not on file   Number of children: Not on file   Years of education: Not on file   Highest education level: Not on file  Occupational History   Not on file  Tobacco Use   Smoking status: Former    Packs/day: 1.50    Years: 20.00    Total pack years: 30.00    Types: Cigarettes    Quit date: 11/05/1999    Years since quitting: 22.7   Smokeless tobacco: Never  Vaping Use   Vaping Use: Never used  Substance and Sexual Activity   Alcohol use: No    Comment: last used 25 years ago    Drug use: Yes    Types: Cocaine    Comment: last used cocaine  and acid 25 yrs ago,none used since   Sexual activity: Not on file  Other Topics Concern   Not on file  Social History Narrative   Not on file   Social Determinants of Health   Financial Resource Strain: Not on file  Food Insecurity: Not on file  Transportation Needs: Not on file  Physical Activity: Not on file  Stress: Not on file  Social Connections: Not on file  Intimate Partner Violence: Not on file    Review of Systems  Constitutional:  Negative for fatigue and unexpected weight change.  Eyes:  Negative for visual disturbance.  Respiratory:  Negative for cough, chest tightness and shortness of breath.   Cardiovascular:  Negative for chest pain, palpitations and leg swelling.  Gastrointestinal:  Negative for abdominal pain and blood in stool.  Neurological:  Negative for dizziness, light-headedness and headaches.     Objective:   Vitals:   07/24/22 1347  BP: 138/78  Pulse: 88  Temp: 98.5 F (36.9 C)  SpO2: 94%  Weight: 228 lb 12.8 oz (103.8 kg)  Height: 6' (1.829 m)      Physical Exam Vitals reviewed.  Constitutional:      Appearance: He is well-developed.  HENT:  Head: Normocephalic and atraumatic.  Neck:     Vascular: No carotid bruit or JVD.  Cardiovascular:     Rate and Rhythm: Normal rate and regular rhythm.     Heart sounds: Normal heart sounds. No murmur heard. Pulmonary:     Effort: Pulmonary effort is normal.     Breath sounds: Normal breath sounds. No rales.  Musculoskeletal:     Right lower leg: No edema.     Left lower leg: No edema.  Skin:    General: Skin is warm and dry.  Neurological:     Mental Status: He is alert and oriented to person, place, and time.  Psychiatric:        Mood and Affect: Mood normal.        Assessment & Plan:  [name] is a 71 y.o. male . Heartburn - Plan: esomeprazole (KLS ESOMEPRAZOLE MAGNESIUM) 20 MG capsule  -Controlled on daily dosing of 20 mg esomeprazole, prescription refilled.  Essential hypertension - Plan: amLODipine (NORVASC) 10 MG tablet, Comprehensive metabolic panel  -Borderline control, goal of 130/80 or below.  Some difficulty with size and visualization of 5/2.5 mg combo.  Start 10 mg once per day, suspect he will have better control with this and easier dosing.  RTC precautions, orthostatic/hypotensive precautions discussed.  Check labs.  Hyperlipidemia, unspecified hyperlipidemia type - Plan: pravastatin (PRAVACHOL) 20 MG tablet, Comprehensive metabolic panel, Lipid panel  -Tolerating current dose pravastatin, continue same.  Meds ordered this encounter  Medications   esomeprazole (KLS ESOMEPRAZOLE MAGNESIUM) 20 MG capsule    Sig: Take 1 capsule (20 mg total) by mouth daily.    Dispense:  90 capsule    Refill:  3   pravastatin (PRAVACHOL) 20 MG tablet    Sig: Take 1 tablet (20 mg total) by mouth daily.    Dispense:  90 tablet    Refill:  3   amLODipine (NORVASC) 10 MG tablet    Sig: Take 1 tablet (10 mg total) by mouth daily.    Dispense:  90 tablet     Refill:  3   Patient Instructions  Try higher dose amlodipine - 10 mg pill once per day. If any lightheadedness or new side effects let me know right away.  Try to get some walking in for exercise. I am sorry to hear about the loss of your dog.  Take care.     Signed,   [name], MD [place], [facility] 07/24/22 2:34 PM

## 2022-07-24 NOTE — Patient Instructions (Addendum)
Try higher dose amlodipine - 10 mg pill once per day. If any lightheadedness or new side effects let me know right away.  Try to get some walking in for exercise. I am sorry to hear about the loss of your dog.  Take care.

## 2022-07-25 ENCOUNTER — Encounter: Payer: Self-pay | Admitting: Family Medicine

## 2022-07-25 LAB — COMPREHENSIVE METABOLIC PANEL
ALT: 31 U/L (ref 0–53)
AST: 19 U/L (ref 0–37)
Albumin: 4.5 g/dL (ref 3.5–5.2)
Alkaline Phosphatase: 85 U/L (ref 39–117)
BUN: 11 mg/dL (ref 6–23)
CO2: 29 mEq/L (ref 19–32)
Calcium: 9.2 mg/dL (ref 8.4–10.5)
Chloride: 103 mEq/L (ref 96–112)
Creatinine, Ser: 0.76 mg/dL (ref 0.40–1.50)
GFR: 90.84 mL/min (ref >60.00)
Glucose, Bld: 92 mg/dL (ref 70–99)
Potassium: 3.8 mEq/L (ref 3.5–5.1)
Sodium: 139 mEq/L (ref 135–145)
Total Bilirubin: 0.5 mg/dL (ref 0.2–1.2)
Total Protein: 6.5 g/dL (ref 6.0–8.3)

## 2022-07-25 LAB — LIPID PANEL
Cholesterol: 144 mg/dL (ref 0–200)
HDL: 38.7 mg/dL — ABNORMAL LOW (ref >39.00)
LDL Cholesterol: 85 mg/dL (ref 0–99)
NonHDL: 105.01
Total CHOL/HDL Ratio: 4
Triglycerides: 99 mg/dL (ref 0.0–149.0)
VLDL: 19.8 mg/dL (ref 0.0–40.0)

## 2022-08-03 ENCOUNTER — Encounter: Payer: Self-pay | Admitting: Family Medicine

## 2022-08-03 ENCOUNTER — Ambulatory Visit: Payer: 59 | Admitting: Family Medicine

## 2022-08-03 ENCOUNTER — Ambulatory Visit
Admission: RE | Admit: 2022-08-03 | Discharge: 2022-08-03 | Disposition: A | Discharge status: Home / Self Care | Payer: 59 | Source: Ambulatory Visit | Attending: Family Medicine | Admitting: Family Medicine

## 2022-08-03 VITALS — BP 150/90 | HR 80 | Temp 98.3°F | Ht 72.0 in | Wt 230.6 lb

## 2022-08-03 DIAGNOSIS — R519 Headache, unspecified: Secondary | ICD-10-CM | POA: Diagnosis not present

## 2022-08-03 DIAGNOSIS — R0789 Other chest pain: Secondary | ICD-10-CM | POA: Diagnosis not present

## 2022-08-03 DIAGNOSIS — R42 Dizziness and giddiness: Secondary | ICD-10-CM

## 2022-08-03 DIAGNOSIS — H538 Other visual disturbances: Secondary | ICD-10-CM

## 2022-08-03 DIAGNOSIS — R29898 Other symptoms and signs involving the musculoskeletal system: Secondary | ICD-10-CM

## 2022-08-03 DIAGNOSIS — I10 Essential (primary) hypertension: Secondary | ICD-10-CM

## 2022-08-03 DIAGNOSIS — I440 Atrioventricular block, first degree: Secondary | ICD-10-CM

## 2022-08-03 LAB — BASIC METABOLIC PANEL
BUN: 15 mg/dL (ref 6–23)
CO2: 32 mEq/L (ref 19–32)
Calcium: 9.6 mg/dL (ref 8.4–10.5)
Chloride: 102 mEq/L (ref 96–112)
Creatinine, Ser: 0.82 mg/dL (ref 0.40–1.50)
GFR: 88.76 mL/min (ref >60.00)
Glucose, Bld: 89 mg/dL (ref 70–99)
Potassium: 4.2 mEq/L (ref 3.5–5.1)
Sodium: 139 mEq/L (ref 135–145)

## 2022-08-03 LAB — POCT URINALYSIS DIP (MANUAL ENTRY)
Bilirubin, UA: NEGATIVE
Blood, UA: NEGATIVE
Glucose, UA: NEGATIVE mg/dL
Ketones, POC UA: NEGATIVE mg/dL
Leukocytes, UA: NEGATIVE
Nitrite, UA: NEGATIVE
Spec Grav, UA: 1.01 (ref 1.010–1.025)
Urobilinogen, UA: 0.2 E.U./dL
pH, UA: 7.5 (ref 5.0–8.0)

## 2022-08-03 LAB — CBC
HCT: 49.4 % (ref 39.0–52.0)
Hemoglobin: 16.2 g/dL (ref 13.0–17.0)
MCHC: 32.9 g/dL (ref 30.0–36.0)
MCV: 91.3 fl (ref 78.0–100.0)
Platelets: 269 10*3/uL (ref 150.0–400.0)
RBC: 5.41 Mil/uL (ref 4.22–5.81)
RDW: 13.6 % (ref 11.5–15.5)
WBC: 5.2 10*3/uL (ref 4.0–10.5)

## 2022-08-03 MED ORDER — LOSARTAN POTASSIUM 25 MG PO TABS: 25.0000 mg | ORAL_TABLET | Freq: Every day | ORAL | 1 refills | Status: DC

## 2022-08-03 NOTE — Progress Notes (Signed)
Subjective:  Patient ID: [name], male    DOB: 1951/05/23  Age: 71 y.o. MRN: [id number]  CC:  Chief Complaint  Patient presents with   Hypertension    Pt states every since he started taking the 38m amlodipine  he has been having dizziness blurred vision and headaches     HPI Pedro CHICOINEpresents for   Hypertension: Treated with amlodipine 7.5 mg daily.  Borderline control at his November 6 visit at 138/78.  Was also having some difficulty with the combination of 5/2.5 mg combo with the size and visualization of medications.  Started on amlodipine 10 mg daily. Reports since that visit and change to 120mhas had some headaches, blurry vision, dizziness at times - comes and goes - notices in am after taking amlodipine. 168/80, 178/80 today - at time of HA, dizziness and blurry vision.  HA improves during the day. Some improvement in dizziness and blurry vision at times. Bandlike HA - better right now. Still feeling dizzy in office.  No current pain in chest.  Some left arm weakness past few months - forearm. No injury, no new focal weakness, slurred speech, or facial droop with above new symptoms.  No new supplements. No alcohol or tobacco.  Drinks 1 energy drink at 11am.  Amlodipine at 6am.  Above BP readings at work are before energy drink.  Slight discomfort in left lower chest off and on for past 3 months - like a needle, random times - not associated with any activity.  No radiation, no dyspnea, n/v/diaphoresis with chest symptoms.   Home readings: BP Readings from Last 3 Encounters:  08/03/22 (!) 150/90  07/24/22 138/78  09/02/21 (!) 146/84   Lab Results  Component Value Date   CREATININE 0.76 07/24/2022     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   Pain in left knee 06/02/2019   Allergic rhinitis 03/12/2017    Thrush, oral 02/27/2017   SUI (stress urinary incontinence), male 03/20/2016   [place] [place]   Obese 09/08/2015   S/P right TKA 09/06/2015   S/P knee replacement 09/06/2015   COPD, mild [place]   Pulmonary nodules 02/23/2014   Tobacco use disorder, moderate, in sustained remission 02/23/2014   Recurrent right inguinal hernia 10/20/2013   ED (erectile dysfunction) 08/26/2012   Physical exam, annual 11/05/2011   HTN (hypertension) 11/05/2011   GERD (gastroesophageal reflux disease) 11/05/2011   Chronic joint pain 11/05/2011   Past Medical History:  Diagnosis Date   Allergy    seasonal   Arthritis    oa   Asthma    COPD [place] disease) ([facility]   GERD (gastroesophageal reflux disease)    Hypertension    [place]   prostatectomy    Shortness of breath dyspnea    Past Surgical History:  Procedure Laterality Date   HERNIA REPAIR  last done 3-4 yrs ago   x 2   INGUINAL HERNIA REPAIR Right 11/20/2013   Procedure: OPEN REPAIR OF RECURRENT RIGHT INGUINAL HERNIA WITH MESH;  Surgeon: MaImogene BurnTsGeorgette DoverMD;  Location: [facility];  Service: General;  Laterality: Right;   INSERTION OF MESH Right 11/20/2013   Procedure: INSERTION OF MESH;  Surgeon: MaImogene BurnTsGeorgette DoverMD;  Location: [facility];  Service: General;  Laterality: Right;   KNEE  SURGERY Left july 2014   mcl and meniscus repair   LYMPHADENECTOMY Bilateral 02/09/2016   Procedure: PELVIC LYMPHADENECTOMY;  Surgeon: [name], MD;  Location: [facility];  Service: Urology;  Laterality: Bilateral;   right knee arthroscopy      ROBOT ASSISTED LAPAROSCOPIC RADICAL PROSTATECTOMY N/A 02/09/2016   Procedure: XI ROBOTIC ASSISTED LAPAROSCOPIC RADICAL PROSTATECTOMY WITH INDOCYANINE GREEN DYE AND ADHESIOLYSIS;  Surgeon: [name], MD;  Location: [facility];  Service: Urology;  Laterality: N/A;   TOTAL KNEE ARTHROPLASTY Right 09/06/2015   Procedure: RIGHT TOTAL KNEE ARTHROPLASTY;   Surgeon: [name], MD;  Location: [facility];  Service: Orthopedics;  Laterality: Right;   TOTAL KNEE ARTHROPLASTY Left 06/17/2019   Procedure: TOTAL KNEE ARTHROPLASTY;  Surgeon: [name], MD;  Location: [facility];  Service: Orthopedics;  Laterality: Left;  70 mins   Allergies  Allergen Reactions   Percocet [Oxycodone-Acetaminophen] Other (See Comments)    Nausea-Doesn't work   Amoxicillin Rash    Has patient had a PCN reaction causing immediate rash, facial/tongue/throat swelling, SOB or lightheadedness with hypotension: No Has patient had a PCN reaction causing severe rash involving mucus membranes or skin necrosis: No Has patient had a PCN reaction that required hospitalization No Has patient had a PCN reaction occurring within the last 10 years: Yes- 2016 If all of the above answers are "NO", then may proceed with Cephalosporin use.    Prior to Admission medications   Medication Sig Start Date End Date Taking? Authorizing Provider  albuterol (PROVENTIL HFA) 108 (90 Base) MCG/ACT inhaler Proventil HFA 90 mcg/actuation aerosol inhaler   2 puffs by inhalation route every 4-6 hours as needed. 09/15/20  Yes Collene Gobble, MD  amLODipine (NORVASC) 10 MG tablet Take 1 tablet (10 mg total) by mouth daily. 07/24/22  Yes [name], MD  ANORO ELLIPTA 62.5-25 MCG/INH AEPB INHALE ONE PUFF BY MOUTH INTO THE LUNGS ONE TIME DAILY 10/20/20  Yes Collene Gobble, MD  Cholecalciferol (VITAMIN D3) 50 MCG (2000 UT) TABS Take 2,000 Units by mouth daily.   Yes [provider]  clobetasol cream (TEMOVATE) 0.05 % APPLY TOPICALLY TO AFFECTED AREA(S) NIGHTLY AT BEDTIME 08/30/20  Yes Lavonna Monarch, MD  Clobetasol Prop Emollient Base (CLOBETASOL PROPIONATE E) 0.05 % emollient cream Apply 1 application topically at bedtime. 08/22/21  Yes Lavonna Monarch, MD  cyclobenzaprine (FLEXERIL) 5 MG tablet take one tablet by mouth three times daily as needed for muscle spasm. start at bedtime due to sedation  01/12/20  Yes [name], MD  esomeprazole (KLS ESOMEPRAZOLE MAGNESIUM) 20 MG capsule Take 1 capsule (20 mg total) by mouth daily. 07/24/22  Yes [name], MD  fluconazole (DIFLUCAN) 150 MG tablet Take 1 tablet (150 mg total) by mouth daily. Take 338m on day 1, then 1523monce per day. 06/22/21  Yes Burns, StClaudina LickMD  HYDROcodone-acetaminophen (NORCO/VICODIN) 5-325 MG tablet Take 1 tablet by mouth every 6 (six) hours as needed. 02/21/21  Yes WaTrula SladeDPM  INCRUSE ELLIPTA 62.5 MCG/INH AEPB INHALE ONE PUFF BY MOUTH INTO THE LUNGS ONCE DAILY 02/24/20  Yes Byrum, RoRose FillersMD  KLS ALLER-FLO 50 MCG/ACT nasal spray PLACE TWO SPRAYS INTO BOTH NOSTRILS ONCE A DAY 06/12/22  Yes GrWendie AgresteMD  meloxicam (MOBIC) 15 MG tablet Take 1 tablet (15 mg total) by mouth daily as needed for pain. 07/03/22 07/03/23 Yes WaTrula SladeDPM  Menthol-Methyl Salicylate (SALONPAS PAIN RELIEF PATCH EX) Place 1 patch onto the  skin daily as needed (pain.).   Yes [provider]  methocarbamol (ROBAXIN) 500 MG tablet Take 500 mg by mouth 3 (three) times daily. 08/02/21  Yes [provider]  pravastatin (PRAVACHOL) 20 MG tablet Take 1 tablet (20 mg total) by mouth daily. 07/24/22  Yes [name], MD  Tavaborole (KERYDIN) 5 % SOLN Apply 1 drop topically daily. Apply 1 drop to the toenail daily. 06/15/22  Yes Trula Slade, DPM  terbinafine (LAMISIL) 250 MG tablet Take 1 tablet (250 mg total) by mouth daily. 03/15/22  Yes Trula Slade, DPM  triamcinolone cream (KENALOG) 0.1 % Apply 1 application topically 2 (two) times daily as needed. To affected areas only as needed. Patient taking differently: Apply 1 application  topically 2 (two) times daily as needed (rash). 01/08/19  Yes [name], MD  umeclidinium-vilanterol [facility] ELLIPTA) 62.5-25 MCG/ACT AEPB INHALE 1 PUFF INTO THE LUNGS BY MOUTH ONCE A DAY 01/18/22  Yes Byrum, Rose Fillers, MD  azelastine (OPTIVAR) 0.05 %  ophthalmic solution Place 1 drop into both eyes 2 (two) times daily as needed. For itching/allergy symptoms Patient not taking: Reported on 07/24/2022 07/14/19   [name], MD  celecoxib (CELEBREX) 200 MG capsule Take 1 capsule (200 mg total) by mouth 2 (two) times daily. Patient not taking: Reported on 07/24/2022 06/18/19   Irving Copas, PA-C  clindamycin (CLEOCIN) 300 MG capsule Take 1 capsule (300 mg total) by mouth 3 (three) times daily. Patient not taking: Reported on 07/24/2022 01/12/21   Trula Slade, DPM  FLOWFLEX COVID-19 [place] TEST KIT  11/09/20   [provider]  promethazine (PHENERGAN) 25 MG tablet Take 1 tablet (25 mg total) by mouth every 8 (eight) hours as needed for nausea or vomiting. Patient not taking: Reported on 07/24/2022 01/12/21   Trula Slade, DPM   Social History   Socioeconomic History   Marital status: Married    Spouse name: Not on file   Number of children: Not on file   Years of education: Not on file   Highest education level: Not on file  Occupational History   Not on file  Tobacco Use   Smoking status: Former    Packs/day: 1.50    Years: 20.00    Total pack years: 30.00    Types: Cigarettes    Quit date: 11/05/1999    Years since quitting: 22.7   Smokeless tobacco: Never  Vaping Use   Vaping Use: Never used  Substance and Sexual Activity   Alcohol use: No    Comment: last used 25 years ago    Drug use: Yes    Types: Cocaine    Comment: last used cocaine  and acid 25 yrs ago,none used since   Sexual activity: Not on file  Other Topics Concern   Not on file  Social History Narrative   Not on file   Social Determinants of Health   Financial Resource Strain: Not on file  Food Insecurity: Not on file  Transportation Needs: Not on file  Physical Activity: Not on file  Stress: Not on file  Social Connections: Not on file  Intimate Partner Violence: Not on file   Review of Systems Per HPI.   Objective:    Vitals:   08/03/22 1101 08/03/22 1130 08/03/22 1228 08/03/22 1229  BP: (!) 144/80 (!) 144/84 (!) 164/100 (!) 150/90  Pulse: 80     Temp: 98.3 F (36.8 C)     SpO2:  94%     Weight: 230 lb 9.6 oz (104.6 kg)     Height: 6' (1.829 m)     Orthostatic VS for the past 24 hrs (Last 3 readings):  BP- Lying BP- Standing at 0 minutes  08/03/22 1244 150/90 (!) 164/100    Physical Exam Vitals reviewed.  Constitutional:      Appearance: He is well-developed.  HENT:     Head: Normocephalic and atraumatic.  Neck:     Vascular: No carotid bruit or JVD.  Cardiovascular:     Rate and Rhythm: Normal rate and regular rhythm.     Heart sounds: Normal heart sounds. No murmur heard.    Comments: Chest nontender, no current chest discomfort.  Unable to reproduce chest discomfort on exam. Pulmonary:     Effort: Pulmonary effort is normal.     Breath sounds: Normal breath sounds. No rales.  Musculoskeletal:     Right lower leg: No edema.     Left lower leg: No edema.  Skin:    General: Skin is warm and dry.  Neurological:     General: No focal deficit present.     Mental Status: He is alert and oriented to person, place, and time.     GCS: GCS eye subscore is 4. GCS verbal subscore is 5. GCS motor subscore is 6.     Cranial Nerves: No dysarthria or facial asymmetry.     Sensory: No sensory deficit.     Motor: No weakness or pronator drift.     Coordination: Coordination is intact. Romberg sign negative. Coordination normal. Finger-Nose-Finger Test normal.     Gait: Gait is intact.     Comments: Equal upper extremity strength including resisted elbow flexion on left versus right were equal.  Equal grip strength, no focal weakness appreciated. Equal facial movements, no droop. No nystagmus of eyes. Ambulating without assistance, normal gait, negative Romberg.  No pronator drift.  Psychiatric:        Mood and Affect: Mood normal.        Behavior: Behavior normal.    EKG: Sinus rhythm, rate  71, first-degree AV block with PR interval 240.  No apparent acute ST or T wave changes compared to 07/12/2020, but PR interval has increased from 210-240 today.  48 minutes spent during visit, including chart review, discussion of various symptoms above, review of orthostatics, exam, neuro exam,  Counseling and assimilation of information, exam, discussion of plan, and chart completion.  Time does not include evaluation of EKG as above.  Results for orders placed or performed in visit on 08/03/22  POCT urinalysis dipstick  Result Value Ref Range   Color, UA yellow yellow   Clarity, UA clear clear   Glucose, UA negative negative mg/dL   Bilirubin, UA negative negative   Ketones, POC UA negative negative mg/dL   Spec Grav, UA 1.010 1.010 - 1.025   Blood, UA negative negative   pH, UA 7.5 5.0 - 8.0   Protein Ur, POC trace (A) negative mg/dL   Urobilinogen, UA 0.2 0.2 or 1.0 E.U./dL   Nitrite, UA Negative Negative   Leukocytes, UA Negative Negative      Assessment & Plan:  [name] is a 70 y.o. male . Nonintractable episodic headache, unspecified headache type - Plan: CT HEAD WO CONTRAST (5MM) Lightheadedness - Plan: Orthostatic vital signs, Basic metabolic panel, CBC, POCT urinalysis dipstick, CT HEAD WO CONTRAST (5MM) Blurred vision, bilateral - Plan: CT HEAD WO CONTRAST (5MM) Left  arm weakness - Plan: CT HEAD WO CONTRAST (5MM)  -Episodic headache, lightheadedness, blurry vision over the past 10 days.  Attributes to start of higher dose amlodipine, but blood pressure has been elevated, not low, including outside readings that are higher than in office.  Has used energy drinks, but blood pressure has been elevated and symptoms above prior to use of energy drinks.  Nonfocal neurologic exam, and reports of the subjective left arm weakness has been present for few months.  I do not appreciate significant weakness on exam.  -Check CBC, BMP.  Based on persistent elevated blood  pressures we will add losartan 25 mg daily.  Potential side effects discussed, monitor blood pressures with recheck in 1 week, ER precautions given if any worsening symptoms  -Check CT head with the blurry vision, headache, dizziness and reported left arm weakness although that is not acute.  Chest discomfort - Plan: EKG 12-Lead, Ambulatory referral to Cardiology First degree AV block - Plan: Ambulatory referral to Cardiology  -No apparent acute findings on EKG other than prolonged PR interval.  Intermittent left-sided chest discomfort which is mild, atypical, infrequent.  Refer to cardiology, especially with difficulty with blood pressure control and other symptoms as above.  ER precautions given.  1 week follow-up.   Meds ordered this encounter  Medications   losartan (COZAAR) 25 MG tablet    Sig: Take 1 tablet (25 mg total) by mouth daily.    Dispense:  30 tablet    Refill:  1   Patient Instructions  Stop energy drinks.  Blood pressure elevated today, I am hesitant to decrease the dose of your amlodipine to previous dosage as that does not appear to be the cause of your lightheadedness or headache.  Add losartan once per day. We may need to add another medication.  I will check a CT of the head today, and also am referring you to cardiology to discuss your episodic chest symptoms and EKG.  If any worsening chest pain, new weakness, or worsening symptoms proceed to the emergency room.  Recheck with me in 1 week.   Return to the clinic or go to the nearest emergency room if any of your symptoms worsen or new symptoms occur.     Signed,   [name], MD [place] 08/03/22 1:15 PM

## 2022-08-03 NOTE — Patient Instructions (Addendum)
Stop energy drinks.  Blood pressure elevated today, I am hesitant to decrease the dose of your amlodipine to previous dosage as that does not appear to be the cause of your lightheadedness or headache.  Add losartan once per day. We may need to add another medication.  I will check a CT of the head today, and also am referring you to cardiology to discuss your episodic chest symptoms and EKG.  If any worsening chest pain, new weakness, or worsening symptoms proceed to the emergency room.  Recheck with me in 1 week.   Return to the clinic or go to the nearest emergency room if any of your symptoms worsen or new symptoms occur.

## 2022-08-07 ENCOUNTER — Encounter: Payer: Self-pay | Admitting: Internal Medicine

## 2022-08-07 ENCOUNTER — Ambulatory Visit: Payer: 59 | Attending: Internal Medicine | Admitting: Internal Medicine

## 2022-08-07 VITALS — BP 155/82 | HR 79 | Ht 72.0 in | Wt 228.4 lb

## 2022-08-07 DIAGNOSIS — R072 Precordial pain: Secondary | ICD-10-CM

## 2022-08-07 MED ORDER — METOPROLOL TARTRATE 50 MG PO TABS: 50.0000 mg | ORAL_TABLET | Freq: Once | ORAL | 0 refills | Status: DC

## 2022-08-07 NOTE — Progress Notes (Signed)
Cardiology Office Note:    Date:  08/07/2022   ID:  [name], DOB July 03, 1951, MRN [id number]  PCP:  Pedro Agreste, MD   [facility] Prov[facility] Cardiologist:  Janina Mayo, MD     Referring MD: Pedro Agreste, MD   No chief complaint on file. CP   History of Present Illness:    [name] is a 71 y.o. male with a hx of HTN, GERD, asthma, COPD, prostate cancer s/p prostatectomy. Noted some CP that was infrequent over the last 3 months with his PCP Dr. Carlota Torres. Also reported HA/LH. Noted 1st degree AV block (can't see). ECG from 07/12/2020 shows NSR with septal Q waves.  He notes feeling chest needles. Not associated with activated Lasts 5- 6 minutes, then resolves. .Blood pressure can be too high. Just started  added losartan 25 mg daily. Stopped smoking 24 years ago. Start at age 20 or 75. 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 No orthopnea/PND.  No LE edema. Had prior stress test in the past , it was normal.  Past Medical History:  Diagnosis Date   Allergy    seasonal   Arthritis    oa   Asthma    COPD (chronic obstructive pulmonary disease) ([place])    GERD (gastroesophageal reflux disease)    Hypertension    Prostate cancer ([place]) 01/2016   prostatectomy    Shortness of breath dyspnea     Past Surgical History:  Procedure Laterality Date   HERNIA REPAIR  last done 3-4 yrs ago   x 2   INGUINAL HERNIA REPAIR Right 11/20/2013   Procedure: OPEN REPAIR OF RECURRENT RIGHT INGUINAL HERNIA WITH MESH;  Surgeon: [name]. [name], MD;  Location: [facility];  Service: General;  Laterality: Right;   INSERTION OF MESH Right 11/20/2013   Procedure: INSERTION OF MESH;  Surgeon: [name]. [name], MD;  Location: [facility];  Service: General;  Laterality: Right;   KNEE SURGERY Left july 2014   mcl and meniscus repair   LYMPHADENECTOMY Bilateral 02/09/2016   Procedure: PELVIC LYMPHADENECTOMY;  Surgeon: [name], MD;  Location: [facility];  Service: Urology;  Laterality:  Bilateral;   right knee arthroscopy      ROBOT ASSISTED LAPAROSCOPIC RADICAL PROSTATECTOMY N/A 02/09/2016   Procedure: XI ROBOTIC ASSISTED LAPAROSCOPIC RADICAL PROSTATECTOMY WITH INDOCYANINE GREEN DYE AND ADHESIOLYSIS;  Surgeon: [name], MD;  Location: [facility];  Service: Urology;  Laterality: N/A;   TOTAL KNEE ARTHROPLASTY Right 09/06/2015   Procedure: RIGHT TOTAL KNEE ARTHROPLASTY;  Surgeon: [name], MD;  Location: [facility];  Service: Orthopedics;  Laterality: Right;   TOTAL KNEE ARTHROPLASTY Left 06/17/2019   Procedure: TOTAL KNEE ARTHROPLASTY;  Surgeon: [name], MD;  Location: [facility];  Service: Orthopedics;  Laterality: Left;  70 mins    Current Medications: Current Meds  Medication Sig   albuterol (PROVENTIL HFA) 108 (90 Base) MCG/ACT inhaler Proventil HFA 90 mcg/actuation aerosol inhaler   2 puffs by inhalation route every 4-6 hours as needed.   amLODipine (NORVASC) 10 MG tablet Take 1 tablet (10 mg total) by mouth daily.   ANORO ELLIPTA 62.5-25 MCG/INH AEPB INHALE ONE PUFF BY MOUTH INTO THE LUNGS ONE TIME DAILY   azelastine (OPTIVAR) 0.05 % ophthalmic solution Place 1 drop into both eyes 2 (two) times daily as needed. For itching/allergy symptoms   Cholecalciferol (VITAMIN D3) 50 MCG (2000 UT) TABS Take 2,000 Units by mouth daily.   clobetasol cream (TEMOVATE)  0.05 % APPLY TOPICALLY TO AFFECTED AREA(S) NIGHTLY AT BEDTIME   Clobetasol Prop Emollient Base (CLOBETASOL PROPIONATE E) 0.05 % emollient cream Apply 1 application topically at bedtime.   cyclobenzaprine (FLEXERIL) 5 MG tablet take one tablet by mouth three times daily as needed for muscle spasm. start at bedtime due to sedation   esomeprazole (KLS ESOMEPRAZOLE MAGNESIUM) 20 MG capsule Take 1 capsule (20 mg total) by mouth daily.   FLOWFLEX COVID-19 AG HOME TEST KIT    KLS ALLER-FLO 50 MCG/ACT nasal spray PLACE TWO SPRAYS INTO BOTH NOSTRILS ONCE A DAY   losartan (COZAAR) 25 MG tablet Take 1 tablet (25 mg total) by  mouth daily.   meloxicam (MOBIC) 15 MG tablet Take 1 tablet (15 mg total) by mouth daily as needed for pain.   Menthol-Methyl Salicylate (SALONPAS PAIN RELIEF PATCH EX) Place 1 patch onto the skin daily as needed (pain.).   methocarbamol (ROBAXIN) 500 MG tablet Take 500 mg by mouth 3 (three) times daily.   metoprolol tartrate (LOPRESSOR) 50 MG tablet Take 1 tablet (50 mg total) by mouth once for 1 dose. PLEASE TAKE METOPROLOL 2  HOURS PRIOR TO CTA SCAN.   pravastatin (PRAVACHOL) 20 MG tablet Take 1 tablet (20 mg total) by mouth daily.   promethazine (PHENERGAN) 25 MG tablet Take 1 tablet (25 mg total) by mouth every 8 (eight) hours as needed for nausea or vomiting.   Tavaborole (KERYDIN) 5 % SOLN Apply 1 drop topically daily. Apply 1 drop to the toenail daily.   terbinafine (LAMISIL) 250 MG tablet Take 1 tablet (250 mg total) by mouth daily.   triamcinolone cream (KENALOG) 0.1 % Apply 1 application topically 2 (two) times daily as needed. To affected areas only as needed. (Patient taking differently: Apply 1 application  topically 2 (two) times daily as needed (rash).)   Current Facility-Administered Medications for the 08/07/22 encounter (Office Visit) with Janina Mayo, MD  Medication   triamcinolone acetonide (KENALOG) 10 MG/ML injection 10 mg     Allergies:   Percocet [oxycodone-acetaminophen] and Amoxicillin   Social History   Socioeconomic History   Marital status: Married    Spouse name: Not on file   Number of children: Not on file   Years of education: Not on file   Highest education level: Not on file  Occupational History   Not on file  Tobacco Use   Smoking status: Former    Packs/day: 1.50    Years: 20.00    Total pack years: 30.00    Types: Cigarettes    Quit date: 11/05/1999    Years since quitting: 22.7   Smokeless tobacco: Never  Vaping Use   Vaping Use: Never used  Substance and Sexual Activity   Alcohol use: No    Comment: last used 25 years ago    Drug  use: Yes    Types: Cocaine    Comment: last used cocaine  and acid 25 yrs ago,none used since   Sexual activity: Not on file  Other Topics Concern   Not on file  Social History Narrative   Not on file   Social Determinants of Health   Financial Resource Strain: Not on file  Food Insecurity: Not on file  Transportation Needs: Not on file  Physical Activity: Not on file  Stress: Not on file  Social Connections: Not on file     Family History: The patient's family history includes Arthritis in his sister; Cancer in his father, maternal grandmother, mother,  and sister; Heart disease in his maternal grandfather; Stroke in his maternal grandfather.  ROS:   Please see the history of present illness.     All other systems reviewed and are negative.  EKGs/Labs/Other Studies Reviewed:    The following studies were reviewed today:   EKG:  EKG is  ordered today.  The ekg ordered today demonstrates   08/07/2022- NSR, septal Q, LAFB, 1st degree AV block  Recent Labs: 07/24/2022: ALT 31 08/03/2022: BUN 15; Creatinine, Ser 0.82; Hemoglobin 16.2; Platelets 269.0; Potassium 4.2; Sodium 139   Recent Lipid Panel    Component Value Date/Time   CHOL 144 07/24/2022 1446   CHOL 155 07/12/2020 1210   TRIG 99.0 07/24/2022 1446   HDL 38.70 (L) 07/24/2022 1446   HDL 39 (L) 07/12/2020 1210   CHOLHDL 4 07/24/2022 1446   VLDL 19.8 07/24/2022 1446   LDLCALC 85 07/24/2022 1446   LDLCALC 101 (H) 07/12/2020 1210     Risk Assessment/Calculations:     Physical Exam:    VS:   Vitals:   08/07/22 1054  BP: (!) 155/82  Pulse: 79  SpO2: 97%     Wt Readings from Last 3 Encounters:  08/07/22 228 lb 6.4 oz (103.6 kg)  08/03/22 230 lb 9.6 oz (104.6 kg)  07/24/22 228 lb 12.8 oz (103.8 kg)     GEN:  Well nourished, well developed in no acute distress HEENT: Normal NECK: No JVD; No carotid bruits LYMPHATICS: No lymphadenopathy CARDIAC: RRR, no murmurs, rubs, gallops RESPIRATORY:  Clear  to auscultation without rales, wheezing or rhonchi  ABDOMEN: Soft, non-tender, non-distended MUSCULOSKELETAL:  No edema; No deformity  SKIN: Warm and dry NEUROLOGIC:  Alert and oriented x 3 PSYCHIATRIC:  Normal affect   ASSESSMENT:    Chest Pain: atypical signs, however he has higher CVD risk.  Will plan for a coronary CTA  HLD: continue pravastatin 20 mg daily. LDL 85 mg/dL will set goal after CT scan  HTN: just started losartan 25 mg daily.  Continue norvasc 10 mg daily. Will re-assess in 3 months. BP goal < 130/80 mmHg  PLAN:    In order of problems listed above:  Coronary CTA morph with metoprolol 50 mg tartrate x1 Follow up 3 months           Medication Adjustments/Labs and Tests Ordered: Current medicines are reviewed at length with the patient today.  Concerns regarding medicines are outlined above.  Orders Placed This Encounter  Procedures   CT CORONARY MORPH W/CTA COR W/SCORE W/CA W/CM &/OR WO/CM   EKG 12-Lead   Meds ordered this encounter  Medications   metoprolol tartrate (LOPRESSOR) 50 MG tablet    Sig: Take 1 tablet (50 mg total) by mouth once for 1 dose. PLEASE TAKE METOPROLOL 2  HOURS PRIOR TO CTA SCAN.    Dispense:  1 tablet    Refill:  0    Patient Instructions  Medication Instructions:  PLEASE TAKE METOPROLOL TARTRATE 31m TWO HOURS PRIOR TO CCTA SCAN  *If you need a refill on your cardiac medications before your next appointment, please call your pharmacy*  Lab Work: None Ordered At This Time.  If you have labs (blood work) drawn today and your tests are completely normal, you will receive your results only by: [place](if you have MyChart) OR A paper copy in the mail If you have any lab test that is abnormal or we need to change your treatment, we will call you to review the  results.  Testing/Procedures: Your physician has requested that you have cardiac CT. Cardiac computed tomography (CT) is a painless test that uses an x-ray  machine to take clear, detailed pictures of your heart. For further information please visit [email or web address][email or web address]. Please follow instruction sheet as given.  Follow-Up: At [facility], you and your health needs are our priority.  As part of our continuing mission to provide you with exceptional heart care, we have created designated Provider Care Teams.  These Care Teams include your primary Cardiologist (physician) and Advanced Practice Providers (APPs -  Physician Assistants and Nurse Practitioners) who all work together to provide you with the care you need, when you need it.  Your next appointment:   3 month(s)  The format for your next appointment:   In Person  Provider:   Janina Mayo, MD     Other Instructions Please check your blood pressure at home daily, write it down for 1 week. Bring the readings to your next visit with Dr. Harl Bowie    Your cardiac CT will be scheduled at one of the below locations:   [facility] [address] [place], [place] [address] [phone]  If scheduled at [facility], please arrive at the [facility] and Children's Entrance (Entrance C2) of [facility] 30 minutes prior to test start time. You can use the FREE valet parking offered at entrance C (encouraged to control the heart rate for the test)  Proceed to the [facility] Radiology Department (first floor) to check-in and test prep.  All radiology patients and guests should use entrance C2 at [facility], accessed from [facility], even though the hospital's physical address listed is [address].    Please follow these instructions carefully (unless otherwise directed):  Hold all erectile dysfunction medications at least 3 days (72 hrs) prior to test. (Ie viagra, cialis, sildenafil, tadalafil, etc) We will administer nitroglycerin during this exam.   On the Night Before the Test: Be sure to Drink plenty of water. Do not  consume any caffeinated/decaffeinated beverages or chocolate 12 hours prior to your test. Do not take any antihistamines 12 hours prior to your test.  On the Day of the Test: Drink plenty of water until 1 hour prior to the test. Do not eat any food 1 hour prior to test. You may take your regular medications prior to the test.  Take metoprolol (Lopressor) two hours prior to test. HOLD Furosemide/Hydrochlorothiazide morning of the test.  After the Test: Drink plenty of water. After receiving IV contrast, you may experience a mild flushed feeling. This is normal. On occasion, you may experience a mild rash up to 24 hours after the test. This is not dangerous. If this occurs, you can take Benadryl 25 mg and increase your fluid intake. If you experience trouble breathing, this can be serious. If it is severe call 911 IMMEDIATELY. If it is mild, please call our office. If you take any of these medications: Glipizide/Metformin, Avandament, Glucavance, please do not take 48 hours after completing test unless otherwise instructed.  We will call to schedule your test 2-4 weeks out understanding that some insurance companies will need an authorization prior to the service being performed.   For non-scheduling related questions, please contact the cardiac imaging nurse navigator should you have any questions/concerns: Marchia Bond, Cardiac Imaging Nurse Navigator Gordy Clement, Cardiac Imaging Nurse Navigator [facility] Direct Office Dial: [phone]  For scheduling needs, including cancellations and rescheduling, please call [place], [phone].           Signed, Janina Mayo, MD  08/07/2022 12:30 PM    [place]

## 2022-08-07 NOTE — Patient Instructions (Signed)
Medication Instructions:  PLEASE TAKE METOPROLOL TARTRATE '50mg'$  TWO HOURS PRIOR TO CCTA SCAN  *If you need a refill on your cardiac medications before your next appointment, please call your pharmacy*  Lab Work: None Ordered At This Time.  If you have labs (blood work) drawn today and your tests are completely normal, you will receive your results only by: [place] (if you have MyChart) OR A paper copy in the mail If you have any lab test that is abnormal or we need to change your treatment, we will call you to review the results.  Testing/Procedures: Your physician has requested that you have cardiac CT. Cardiac computed tomography (CT) is a painless test that uses an x-ray machine to take clear, detailed pictures of your heart. For further information please visit [facility]uge[facility]iest[facility][email or web address]. Please follow instruction sheet as given.  Follow-Up: At [facility], you and your health needs are our priority.  As part of our continuing mission to provide you with exceptional heart care, we have created designated Provider Care Teams.  These Care Teams include your primary Cardiologist (physician) and Advanced Practice Providers (APPs -  Physician Assistants and Nurse Practitioners) who all work together to provide you with the care you need, when you need it.  Your next appointment:   3 month(s)  The format for your next appointment:   In Person  Provider:   Janina Mayo, MD     Other Instructions Please check your blood pressure at home daily, write it down for 1 week. Bring the readings to your next visit with Dr. Harl Bowie    Your cardiac CT will be scheduled at one of the below locations:   [facility] [address] [place], [place] [address] [phone]  If scheduled at [facility], please arrive at the [facility] and Children's Entrance (Entrance C2) of [facility] 30 minutes prior to test start time. You can use the FREE valet parking  offered at entrance C (encouraged to control the heart rate for the test)  Proceed to the [facility] Radiology Department (first floor) to check-in and test prep.  All radiology patients and guests should use entrance C2 at [facility], accessed from [facility], even though the hospital's physical address listed is [address]    Please follow these instructions carefully (unless otherwise directed):  Hold all erectile dysfunction medications at least 3 days (72 hrs) prior to test. (Ie viagra, cialis, sildenafil, tadalafil, etc) We will administer nitroglycerin during this exam.   On the Night Before the Test: Be sure to Drink plenty of water. Do not consume any caffeinated/decaffeinated beverages or chocolate 12 hours prior to your test. Do not take any antihistamines 12 hours prior to your test.  On the Day of the Test: Drink plenty of water until 1 hour prior to the test. Do not eat any food 1 hour prior to test. You may take your regular medications prior to the test.  Take metoprolol (Lopressor) two hours prior to test. HOLD Furosemide/Hydrochlorothiazide morning of the test.  After the Test: Drink plenty of water. After receiving IV contrast, you may experience a mild flushed feeling. This is normal. On occasion, you may experience a mild rash up to 24 hours after the test. This is not dangerous. If this occurs, you can take Benadryl 25 mg and increase your fluid intake. If you experience trouble breathing, this can be serious. If it is severe call 911 IMMEDIATELY. If  it is mild, please call our office. If you take any of these medications: Glipizide/Metformin, Avandament, Glucavance, please do not take 48 hours after completing test unless otherwise instructed.  We will call to schedule your test 2-4 weeks out understanding that some insurance companies will need an authorization prior to the service being performed.   For non-scheduling related  questions, please contact the cardiac imaging nurse navigator should you have any questions/concerns: Marchia Bond, Cardiac Imaging Nurse Navigator Gordy Clement, Cardiac Imaging Nurse Navigator [facility] Direct Office Dial: [phone]   For scheduling needs, including cancellations and rescheduling, please call [place], [phone].

## 2022-08-08 ENCOUNTER — Telehealth: Payer: Self-pay | Admitting: Family Medicine

## 2022-08-08 NOTE — Telephone Encounter (Signed)
Pt refused an apt when offered . BP has been elevated per pt

## 2022-08-08 NOTE — Telephone Encounter (Signed)
Noted.  He was seen by cardiology yesterday.  With recent start of losartan blood pressure may still improve this week.  If blood pressure is still remaining over 140/90 in the next few days, have him take 2 of the losartan for a total dose of 50 mg/day.  He has scheduled follow-up with me on November 27.  If any new or worsening symptoms prior to that time should be seen either in office, urgent care or ER.

## 2022-08-08 NOTE — Telephone Encounter (Signed)
Caller name: JAVIONE GUNAWAN  On DPR?: Yes  Call back number: [phone] (mobile)  Provider they see: [name], MD  Reason for call:  Pt called stating that he checked his BP this morning at 8 am it was 165/90 and pulse was 78 (right arm). Pt rechecked BP with left arm, BP was 151/87 pulse 82 at 8:06 am. Pt stated that he has small headaches here and there. I offer pt an appt, pt refuse appt. Told pt to call us back and schedule appt if anything changes.

## 2022-08-14 ENCOUNTER — Ambulatory Visit (INDEPENDENT_AMBULATORY_CARE_PROVIDER_SITE_OTHER): Payer: 59 | Admitting: Family Medicine

## 2022-08-14 ENCOUNTER — Encounter: Payer: Self-pay | Admitting: Family Medicine

## 2022-08-14 VITALS — BP 138/70 | HR 83 | Temp 98.4°F | Ht 73.0 in | Wt 229.8 lb

## 2022-08-14 DIAGNOSIS — I10 Essential (primary) hypertension: Secondary | ICD-10-CM | POA: Diagnosis not present

## 2022-08-14 NOTE — Progress Notes (Signed)
Subjective:  Patient ID: [name], male    DOB: 1951-03-25  Age: 71 y.o. MRN: [id number]  CC:  Chief Complaint  Patient presents with   Hypertension    Pt states all is well    HPI [name] presents for   Hypertension: Follow-up from November 16 visit.  Elevated blood pressure at that time with headache.  Borderline control at his previous visit in November.  Headache noted at that time with lightheadedness and episodic blurry vision.  CT head without acute findings on November 16.  Possible component of chronic sinusitis or posttraumatic change in right maxillary sinus.  intermittent precordial chest symptoms.  Nonfocal neuro exam.  Referred to cardiology. Advised to stop energy drinks, and added losartan 25 mg daily.  Continued amlodipine 10 mg daily. Cardiology note November 20 noted.  No med changes at that time, plan for coronary CTA on 12/11 and 61-monthfollow-up.  Atypical signs.  Continued on pravastatin 20 mg daily with goal LDL planned after CT scan.  Goal blood pressure less than 130/80. Home blood pressure readings range recently from 144-161/84-91. Higher prior, home readings higher than in office.  No new med side effects. Prior HA and vision sx's have improved.   BP Readings from Last 3 Encounters:  08/14/22 138/70  08/07/22 (!) 155/82  08/03/22 (!) 150/90   Lab Results  Component Value Date   CREATININE 0.82 08/03/2022    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   Pain in left knee 06/02/2019   Allergic rhinitis 03/12/2017   Thrush, oral 02/27/2017   SUI (stress urinary incontinence), male 03/20/2016   [place] [place]   Obese 09/08/2015   S/P right TKA 09/06/2015   S/P knee replacement 09/06/2015   COPD, mild ([facility] 07/20/2014   Pulmonary nodules 02/23/2014   Tobacco use disorder,  moderate, in sustained remission 02/23/2014   Recurrent right inguinal hernia 10/20/2013   ED (erectile dysfunction) 08/26/2012   Physical exam, annual 11/05/2011   HTN (hypertension) 11/05/2011   GERD (gastroesophageal reflux disease) 11/05/2011   Chronic joint pain 11/05/2011   Past Medical History:  Diagnosis Date   Allergy    seasonal   Arthritis    oa   Asthma    COPD ([place] obstructive pulmonary disease) [place]    GERD (gastroesophageal reflux disease)    Hypertension    [place] [place]   prostatectomy    Shortness of breath dyspnea    Past Surgical History:  Procedure Laterality Date   HERNIA REPAIR  last done 3-4 yrs ago   x 2   INGUINAL HERNIA REPAIR Right 11/20/2013   Procedure: OPEN REPAIR OF RECURRENT RIGHT INGUINAL HERNIA WITH MESH;  Surgeon: MImogene Burn TGeorgette Dover MD;  Location: [facility];  Service: General;  Laterality: Right;   INSERTION OF MESH Right 11/20/2013   Procedure: INSERTION OF MESH;  Surgeon: MImogene Burn TGeorgette Dover MD;  Location: [facility];  Service: General;  Laterality: Right;   KNEE SURGERY Left july 2014   mcl and meniscus repair   LYMPHADENECTOMY Bilateral 02/09/2016   Procedure: PELVIC LYMPHADENECTOMY;  Surgeon: TAlexis Frock MD;  Location: [facility];  Service: Urology;  Laterality: Bilateral;   right knee arthroscopy      ROBOT ASSISTED LAPAROSCOPIC RADICAL PROSTATECTOMY N/A 02/09/2016   Procedure: XI ROBOTIC ASSISTED LAPAROSCOPIC RADICAL PROSTATECTOMY WITH  INDOCYANINE GREEN DYE AND ADHESIOLYSIS;  Surgeon: [name], MD;  Location: [facility];  Service: Urology;  Laterality: N/A;   TOTAL KNEE ARTHROPLASTY Right 09/06/2015   Procedure: RIGHT TOTAL KNEE ARTHROPLASTY;  Surgeon: [name], MD;  Location: [facility];  Service: Orthopedics;  Laterality: Right;   TOTAL KNEE ARTHROPLASTY Left 06/17/2019   Procedure: TOTAL KNEE ARTHROPLASTY;  Surgeon: [name], MD;  Location: [facility];  Service: Orthopedics;  Laterality: Left;  70 mins   Allergies   Allergen Reactions   Percocet [Oxycodone-Acetaminophen] Other (See Comments)    Nausea-Doesn't work   Amoxicillin Rash    Has patient had a PCN reaction causing immediate rash, facial/tongue/throat swelling, SOB or lightheadedness with hypotension: No Has patient had a PCN reaction causing severe rash involving mucus membranes or skin necrosis: No Has patient had a PCN reaction that required hospitalization No Has patient had a PCN reaction occurring within the last 10 years: Yes- 2016 If all of the above answers are "NO", then may proceed with Cephalosporin use.    Prior to Admission medications   Medication Sig Start Date End Date Taking? Authorizing Provider  albuterol (PROVENTIL HFA) 108 (90 Base) MCG/ACT inhaler Proventil HFA 90 mcg/actuation aerosol inhaler   2 puffs by inhalation route every 4-6 hours as needed. 09/15/20  Yes Collene Gobble, MD  amLODipine (NORVASC) 10 MG tablet Take 1 tablet (10 mg total) by mouth daily. 07/24/22  Yes [name], MD  ANORO ELLIPTA 62.5-25 MCG/INH AEPB INHALE ONE PUFF BY MOUTH INTO THE LUNGS ONE TIME DAILY 10/20/20  Yes Collene Gobble, MD  azelastine (OPTIVAR) 0.05 % ophthalmic solution Place 1 drop into both eyes 2 (two) times daily as needed. For itching/allergy symptoms 07/14/19  Yes [name], MD  Cholecalciferol (VITAMIN D3) 50 MCG (2000 UT) TABS Take 2,000 Units by mouth daily.   Yes [provider]  clobetasol cream (TEMOVATE) 0.05 % APPLY TOPICALLY TO AFFECTED AREA(S) NIGHTLY AT BEDTIME 08/30/20  Yes Lavonna Monarch, MD  Clobetasol Prop Emollient Base (CLOBETASOL PROPIONATE E) 0.05 % emollient cream Apply 1 application topically at bedtime. 08/22/21  Yes Lavonna Monarch, MD  cyclobenzaprine (FLEXERIL) 5 MG tablet take one tablet by mouth three times daily as needed for muscle spasm. start at bedtime due to sedation 01/12/20  Yes [name], MD  esomeprazole (KLS ESOMEPRAZOLE MAGNESIUM) 20 MG capsule Take 1 capsule (20  mg total) by mouth daily. 07/24/22  Yes [name], MD  KLS ALLER-FLO 50 MCG/ACT nasal spray PLACE TWO SPRAYS INTO BOTH NOSTRILS ONCE A DAY 06/12/22  Yes [name], MD  losartan (COZAAR) 25 MG tablet Take 1 tablet (25 mg total) by mouth daily. 08/03/22  Yes [name], MD  meloxicam (MOBIC) 15 MG tablet Take 1 tablet (15 mg total) by mouth daily as needed for pain. 07/03/22 07/03/23 Yes Trula Slade, DPM  Menthol-Methyl Salicylate (SALONPAS PAIN RELIEF PATCH EX) Place 1 patch onto the skin daily as needed (pain.).   Yes [provider]  methocarbamol (ROBAXIN) 500 MG tablet Take 500 mg by mouth 3 (three) times daily. 08/02/21  Yes [provider]  pravastatin (PRAVACHOL) 20 MG tablet Take 1 tablet (20 mg total) by mouth daily. 07/24/22  Yes [name], MD  Tavaborole (KERYDIN) 5 % SOLN Apply 1 drop topically daily. Apply 1 drop to the toenail daily. 06/15/22  Yes Trula Slade, DPM  umeclidinium-vilanterol (ANORO ELLIPTA) 62.5-25 MCG/ACT AEPB INHALE 1 PUFF INTO  THE LUNGS BY MOUTH ONCE A DAY 01/18/22  Yes Byrum, Rose Fillers, MD  celecoxib (CELEBREX) 200 MG capsule Take 1 capsule (200 mg total) by mouth 2 (two) times daily. Patient not taking: Reported on 07/24/2022 06/18/19   Irving Copas, PA-C  clindamycin (CLEOCIN) 300 MG capsule Take 1 capsule (300 mg total) by mouth 3 (three) times daily. Patient not taking: Reported on 08/14/2022 01/12/21   Trula Slade, DPM  FLOWFLEX COVID-19 [place] TEST KIT  11/09/20   [provider]  fluconazole (DIFLUCAN) 150 MG tablet Take 1 tablet (150 mg total) by mouth daily. Take 321m on day 1, then 1517monce per day. Patient not taking: Reported on 08/14/2022 06/22/21   BuBinnie RailMD  HYDROcodone-acetaminophen (NORCO/VICODIN) 5-325 MG tablet Take 1 tablet by mouth every 6 (six) hours as needed. Patient not taking: Reported on 08/14/2022 02/21/21   WaTrula SladeDPM  INCRUSE ELLIPTA 62.5  MCG/INH AEPB INHALE ONE PUFF BY MOUTH INTO THE LUNGS ONCE DAILY Patient not taking: Reported on 08/14/2022 02/24/20   ByCollene GobbleMD  metoprolol tartrate (LOPRESSOR) 50 MG tablet Take 1 tablet (50 mg total) by mouth once for 1 dose. PLEASE TAKE METOPROLOL 2  HOURS PRIOR TO CTA SCAN. 08/07/22 08/07/22  BrJanina MayoMD  promethazine (PHENERGAN) 25 MG tablet Take 1 tablet (25 mg total) by mouth every 8 (eight) hours as needed for nausea or vomiting. Patient not taking: Reported on 08/14/2022 01/12/21   WaTrula SladeDPM  terbinafine (LAMISIL) 250 MG tablet Take 1 tablet (250 mg total) by mouth daily. Patient not taking: Reported on 08/14/2022 03/15/22   WaTrula SladeDPM  triamcinolone cream (KENALOG) 0.1 % Apply 1 application topically 2 (two) times daily as needed. To affected areas only as needed. Patient not taking: Reported on 08/14/2022 01/08/19   GrWendie AgresteMD   Social History   Socioeconomic History   Marital status: Married    Spouse name: Not on file   Number of children: Not on file   Years of education: Not on file   Highest education level: Not on file  Occupational History   Not on file  Tobacco Use   Smoking status: Former    Packs/day: 1.50    Years: 20.00    Total pack years: 30.00    Types: Cigarettes    Quit date: 11/05/1999    Years since quitting: 22.7   Smokeless tobacco: Never  Vaping Use   Vaping Use: Never used  Substance and Sexual Activity   Alcohol use: No    Comment: last used 25 years ago    Drug use: Yes    Types: Cocaine    Comment: last used cocaine  and acid 25 yrs ago,none used since   Sexual activity: Not on file  Other Topics Concern   Not on file  Social History Narrative   Not on file   Social Determinants of Health   Financial Resource Strain: Not on file  Food Insecurity: Not on file  Transportation Needs: Not on file  Physical Activity: Not on file  Stress: Not on file  Social Connections: Not on file   Intimate Partner Violence: Not on file    Review of Systems  Constitutional:  Negative for fatigue and unexpected weight change.  Eyes:  Negative for visual disturbance.  Respiratory:  Negative for cough, chest tightness and shortness of breath.   Cardiovascular:  Negative for chest pain, palpitations and  leg swelling.  Gastrointestinal:  Negative for abdominal pain and blood in stool.  Neurological:  Negative for dizziness, light-headedness and headaches.    Per HPI.  Objective:   Vitals:   08/14/22 1317  BP: 138/70  Pulse: 83  Temp: 98.4 F (36.9 C)  SpO2: 96%  Weight: 229 lb 12.8 oz (104.2 kg)  Height: _0  (1.854 m)   Physical Exam Vitals reviewed.  Constitutional:      Appearance: He is well-developed.  HENT:     Head: Normocephalic and atraumatic.  Neck:     Vascular: No carotid bruit or JVD.  Cardiovascular:     Rate and Rhythm: Normal rate and regular rhythm.     Heart sounds: Normal heart sounds. No murmur heard. Pulmonary:     Effort: Pulmonary effort is normal.     Breath sounds: Normal breath sounds. No rales.  Musculoskeletal:     Right lower leg: No edema.     Left lower leg: No edema.  Skin:    General: Skin is warm and dry.  Neurological:     Mental Status: He is alert and oriented to person, place, and time.  Psychiatric:        Mood and Affect: Mood normal.        Assessment & Plan:  [name] is a 71 y.o. male . Essential hypertension Improving control.  Still elevated home readings, borderline in office.  Option to increase the losartan to 50 mg/day, continue amlodipine 10 mg.  We will initially try 2 of his 25 mg and if that is tolerated I can send in a new dose of the losartan to his pharmacy.  1 month follow-up.  Continue follow-up plan as above with cardiology including coronary CT.  RTC precautions.  No orders of the defined types were placed in this encounter.  Patient Instructions  Try increasing losartan to 40m per  day (take 2 of the 25 mg and if that is tolerated let me know so I can send in the higher dose). If you have to stay at the 250mdose, I need to know that as well.   Glad to hear you are feeling better.  Recheck in 1 month.   Return to the clinic or go to the nearest emergency room if any of your symptoms worsen or new symptoms occur.      Signed,   JeMerri RayMD [facility] 08/14/22 2:02 PM

## 2022-08-14 NOTE — Patient Instructions (Addendum)
Try increasing losartan to '50mg'$  per day (take 2 of the 25 mg and if that is tolerated let me know so I can send in the higher dose). If you have to stay at the '25mg'$  dose, I need to know that as well.   Glad to hear you are feeling better.  Recheck in 1 month.   Return to the clinic or go to the nearest emergency room if any of your symptoms worsen or new symptoms occur.

## 2022-08-23 ENCOUNTER — Other Ambulatory Visit: Payer: Self-pay

## 2022-08-23 DIAGNOSIS — I10 Essential (primary) hypertension: Secondary | ICD-10-CM

## 2022-08-23 MED ORDER — LOSARTAN POTASSIUM 50 MG PO TABS: 50.0000 mg | ORAL_TABLET | Freq: Every day | ORAL | 0 refills | Status: DC

## 2022-08-25 ENCOUNTER — Telehealth (HOSPITAL_COMMUNITY): Payer: Self-pay | Admitting: *Deleted

## 2022-08-25 ENCOUNTER — Other Ambulatory Visit: Payer: Self-pay | Admitting: Emergency Medicine

## 2022-08-25 NOTE — Telephone Encounter (Signed)
Reaching out to patient to offer assistance regarding upcoming cardiac imaging study; pt verbalizes understanding of appt date/time, parking situation and where to check in, pre-test NPO status and medications ordered, and verified current allergies; name and call back number provided for further questions should they arise  Gordy Clement RN Navigator Cardiac Imaging [facility] [phone] office [phone] cell  Patient to take '50mg'$  metoprolol tartrate two hours prior to his cardiac CT scan.  He is aware to arrive at 10:30am.

## 2022-08-25 NOTE — Telephone Encounter (Signed)
Attempted to call patient regarding upcoming cardiac CT appointment. °Left message on voicemail with name and callback number ° °Ahnaf Caponi RN Navigator Cardiac Imaging °[facility] °[phone] Office °[phone] Cell ° °

## 2022-08-28 ENCOUNTER — Ambulatory Visit: Payer: 59 | Admitting: Dermatology

## 2022-08-28 ENCOUNTER — Other Ambulatory Visit (HOSPITAL_COMMUNITY): Payer: 59

## 2022-08-28 ENCOUNTER — Ambulatory Visit (HOSPITAL_COMMUNITY)
Admission: RE | Admit: 2022-08-28 | Discharge: 2022-08-28 | Disposition: A | Discharge status: Home / Self Care | Payer: 59 | Source: Ambulatory Visit | Attending: Internal Medicine | Admitting: Internal Medicine

## 2022-08-28 DIAGNOSIS — R072 Precordial pain: Secondary | ICD-10-CM | POA: Diagnosis not present

## 2022-08-28 MED ORDER — NITROGLYCERIN 0.4 MG SL SUBL
0.8000 mg | SUBLINGUAL_TABLET | Freq: Once | SUBLINGUAL | Status: AC
  Administered 2022-08-28: 0.8 mg via SUBLINGUAL

## 2022-08-28 MED ORDER — NITROGLYCERIN 0.4 MG SL SUBL
SUBLINGUAL_TABLET | SUBLINGUAL | Status: AC
  Filled 2022-08-28: qty 2

## 2022-08-28 MED ORDER — IOHEXOL 350 MG/ML SOLN
95.0000 mL | Freq: Once | INTRAVENOUS | Status: AC | PRN
  Administered 2022-08-28: 95 mL via INTRAVENOUS

## 2022-09-04 ENCOUNTER — Ambulatory Visit: Payer: 59 | Admitting: Podiatry

## 2022-09-04 ENCOUNTER — Encounter: Payer: Self-pay | Admitting: Podiatry

## 2022-09-04 DIAGNOSIS — D492 Neoplasm of unspecified behavior of bone, soft tissue, and skin: Secondary | ICD-10-CM | POA: Diagnosis not present

## 2022-09-04 DIAGNOSIS — M2042 Other hammer toe(s) (acquired), left foot: Secondary | ICD-10-CM | POA: Diagnosis not present

## 2022-09-04 DIAGNOSIS — M722 Plantar fascial fibromatosis: Secondary | ICD-10-CM | POA: Diagnosis not present

## 2022-09-04 DIAGNOSIS — B351 Tinea unguium: Secondary | ICD-10-CM

## 2022-09-04 MED ORDER — MELOXICAM 15 MG PO TABS: 15.0000 mg | ORAL_TABLET | Freq: Every day | ORAL | 0 refills | Status: DC | PRN

## 2022-09-04 NOTE — Patient Instructions (Signed)

## 2022-09-06 NOTE — Progress Notes (Signed)
Subjective: Chief Complaint  Patient presents with   Callouses    Right foot heel pain, and right foot callus, patient is still having pain, rate of pain 7 out of 10, TX: injection (has helped some), topical medication patient was unable to get at this time      71 year old male presents the office with concerns of left heel pain as well as callus on the right foot along the sesamoid.  Also suggest callus on the left third toe causing pain.  In regards to the heel pain he states he has not been stretching or wearing the brace.  He does try to wear good shoes.  Uses anti-inflammatories intermittently.  No recent injuries.   Objective: AAO x3, NAD DP/PT pulses palpable bilaterally, CRT less than 3 seconds There is still some tenderness to palpation along the plantar medial tubercle of the calcaneus at the insertion of plantar fascia on the right foot. There is no pain along the course of the plantar fascia within the arch of the foot. Plantar fascia appears to be intact. There is no pain with lateral compression of the calcaneus or pain with vibratory sensation. There is no pain along the course or insertion of the achilles tendon.  Hyperkeratotic lesion noted right foot submetatarsal without any underlying ulceration drainage or signs of infection but this appears to be along the area of the sesamoid. Hyperkeratotic lesion noted the distal aspect left third toe due to hammertoe deformity.  There is no underlying ulceration drainage or signs of infection. Nails appear to be growing out.  Is clear on the proximal one half of the toenails noted distal portion is hypertrophic, dystrophic with yellow discoloration.  It does seem to be improving.  No pain in the nails. No pain with calf compression, swelling, warmth, erythema  Assessment: 71 year old male with plantar fasciitis,   Plan: -All treatment options discussed with the patient including all alternatives, risks, complications.  -For the heel  pain offered steroid injection but ultimately he underwent up on this.  Discussed although he is continue with good support he needs to work on stretching, icing on a regular basis and using the plantar fascia brace which she has and he has not been wearing this.  If needed we can refer back to physical therapy. -I sharply debrided the skin lesion on the right plantar foot today without any complications or bleeding.  I cleaned the skin with alcohol pad was placed about salicylic acid and a bandage.  Postprocedure instructions discussed.  Monitor for any signs or symptoms of infection. -Debrided the callus on the left with any complications or bleeding. -Also still has nail fungus and he is going to refill the antifungal as it seems to be helping.  Trula Slade DPM

## 2022-09-07 ENCOUNTER — Telehealth: Payer: Self-pay | Admitting: Family Medicine

## 2022-09-07 DIAGNOSIS — B370 Candidal stomatitis: Secondary | ICD-10-CM

## 2022-09-07 NOTE — Telephone Encounter (Signed)
I spoke to the pt and he states he has white patches on his tongue ,no pain while eating or swallowing  Denies any itching  Advised pt that Dr Carlota Raspberry may not send in any medication he may need a my chart video visit with DR Carlota Raspberry pt expressed verbal understanding he ask if we could ask if he could get a RX sent in

## 2022-09-07 NOTE — Telephone Encounter (Signed)
Caller name: ZALE MARCOTTE  On DPR?: Yes  Call back number: [phone] (mobile)  Provider they see: [name], MD  Reason for call:  Patient called to see if Dr.Greene could send in something for his thrush  . Patient pharmacy is  [facility] # [place], [place]  Patient also stated that you use to take medication for this but he can't remember the name of he medication.

## 2022-09-08 ENCOUNTER — Ambulatory Visit: Payer: 59 | Admitting: Emergency Medicine

## 2022-09-08 MED ORDER — FLUCONAZOLE 150 MG PO TABS: 150.0000 mg | ORAL_TABLET | Freq: Every day | ORAL | 0 refills | Status: DC

## 2022-09-08 NOTE — Telephone Encounter (Signed)
See prior note - white patches on tongue, and inside mouth. Has appt with me on 12/27 scheduled. Min soreness toward back of mouth. 3rd day of sx's. Diflucan '150mg'$  qd for 1 week, keep follow up,  Urgent care precautions given in the interim.

## 2022-09-08 NOTE — Telephone Encounter (Signed)
Pt called back to get update on his medication request.

## 2022-09-08 NOTE — Telephone Encounter (Signed)
History of thrush, will treat presumptively with diflucan '150mg'$  qd for 1 week, rtc precautions given if not resolved. Advised pt on phone.

## 2022-09-08 NOTE — Addendum Note (Signed)
Addended by: [name] R on: 09/08/2022 01:37 PM   Modules accepted: Orders

## 2022-09-08 NOTE — Telephone Encounter (Signed)
Medication request.

## 2022-09-13 ENCOUNTER — Ambulatory Visit: Payer: 59 | Admitting: Family Medicine

## 2022-09-13 ENCOUNTER — Encounter: Payer: Self-pay | Admitting: Family Medicine

## 2022-09-13 DIAGNOSIS — I10 Essential (primary) hypertension: Secondary | ICD-10-CM | POA: Diagnosis not present

## 2022-09-13 DIAGNOSIS — B370 Candidal stomatitis: Secondary | ICD-10-CM

## 2022-09-13 MED ORDER — LOSARTAN POTASSIUM 50 MG PO TABS: 50.0000 mg | ORAL_TABLET | Freq: Every day | ORAL | 1 refills | Status: DC

## 2022-09-13 MED ORDER — FLUCONAZOLE 150 MG PO TABS: 150.0000 mg | ORAL_TABLET | Freq: Every day | ORAL | 0 refills | Status: DC

## 2022-09-13 NOTE — Progress Notes (Signed)
Subjective:  Patient ID: [name], male    DOB: [birth year]  Age: 71 y.o. MRN: [id number]  CC:  Chief Complaint  Patient presents with   Hypertension    Pt states all is well   Thrush    Pt has thrush on his tongue     HPI [name] presents for   Hypertension: Improving control at his November 27 visit.  Losartan increased to 50 mg daily.  Amlodipine 10 mg daily. Follow-up planned with cardiology including coronary CT Coronary CT did not show any coronary artery disease on 08/28/2022.  Coronary calcium score of 0. No new side effects on higher dose losartan.  Has cut back on salt. Some recent diet changes with holidays and added salt.  Home readings:136/82 today.  BP Readings from Last 3 Encounters:  09/13/22 138/74  08/28/22 115/78  08/14/22 138/70   Lab Results  Component Value Date   CREATININE 0.82 08/03/2022    Thrush Discussed on phone 6 days ago, noticed white patches on his tongue few days prior.  Minimal soreness to the back of his mouth, 3 days of symptoms.  Started on Diflucan 150 mg daily for 1 week.  Mouth soreness has improved and less white patches.  Swallowing ok. Uses steroid inhaler. Will start rinsing after use.   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/23/2014   Recurrent right inguinal hernia 10/20/2013   ED (erectile dysfunction) 08/26/2012   Physical exam, annual 11/05/2011   HTN (hypertension) 11/05/2011   GERD  (gastroesophageal reflux disease) 11/05/2011   Chronic joint pain 11/05/2011   Past Medical History:  Diagnosis Date   Allergy    seasonal   Arthritis    oa   Asthma    COPD (chronic obstructive pulmonary disease) ([place])    GERD (gastroesophageal reflux disease)    Hypertension    Prostate cancer ([place]) 01/2016   prostatectomy    Shortness of breath dyspnea    Past Surgical History:  Procedure Laterality Date   HERNIA REPAIR  last done 3-4 yrs ago   x 2   INGUINAL HERNIA REPAIR Right 11/20/2013   Procedure: OPEN REPAIR OF RECURRENT RIGHT INGUINAL HERNIA WITH MESH;  Surgeon: [name]. [name], MD;  Location: [facility];  Service: General;  Laterality: Right;   INSERTION OF MESH Right 11/20/2013   Procedure: INSERTION OF MESH;  Surgeon: [name]. [name], MD;  Location: [facility];  Service: General;  Laterality: Right;   KNEE SURGERY Left july 2014   mcl and meniscus repair   LYMPHADENECTOMY Bilateral 02/09/2016   Procedure: PELVIC LYMPHADENECTOMY;  Surgeon: [name], MD;  Location: [facility];  Service: Urology;  Laterality: Bilateral;   right knee arthroscopy      ROBOT ASSISTED LAPAROSCOPIC RADICAL PROSTATECTOMY N/A 02/09/2016   Procedure: XI ROBOTIC ASSISTED LAPAROSCOPIC RADICAL PROSTATECTOMY WITH INDOCYANINE GREEN DYE AND ADHESIOLYSIS;  Surgeon: [name], MD;  Location: [facility];  Service: Urology;  Laterality: N/A;  TOTAL KNEE ARTHROPLASTY Right 09/06/2015   Procedure: RIGHT TOTAL KNEE ARTHROPLASTY;  Surgeon: [name], MD;  Location: [facility];  Service: Orthopedics;  Laterality: Right;   TOTAL KNEE ARTHROPLASTY Left 06/17/2019   Procedure: TOTAL KNEE ARTHROPLASTY;  Surgeon: [name], MD;  Location: [facility];  Service: Orthopedics;  Laterality: Left;  70 mins   Allergies  Allergen Reactions   Percocet [Oxycodone-Acetaminophen] Other (See Comments)    Nausea-Doesn't work   Amoxicillin Rash    Has patient had a PCN reaction causing immediate rash, facial/tongue/throat swelling,  SOB or lightheadedness with hypotension: No Has patient had a PCN reaction causing severe rash involving mucus membranes or skin necrosis: No Has patient had a PCN reaction that required hospitalization No Has patient had a PCN reaction occurring within the last 10 years: Yes- 2016 If all of the above answers are "NO", then may proceed with Cephalosporin use.    Prior to Admission medications   Medication Sig Start Date End Date Taking? Authorizing Provider  albuterol (PROVENTIL HFA) 108 (90 Base) MCG/ACT inhaler Proventil HFA 90 mcg/actuation aerosol inhaler   2 puffs by inhalation route every 4-6 hours as needed. 09/15/20  Yes Collene Gobble, MD  amLODipine (NORVASC) 10 MG tablet Take 1 tablet (10 mg total) by mouth daily. 07/24/22  Yes [name], MD  ANORO ELLIPTA 62.5-25 MCG/ACT AEPB INHALE 1 PUFF INTO THE LUNGS BY MOUTH ONCE A DAY 08/28/22  Yes Byrum, Rose Fillers, MD  azelastine (OPTIVAR) 0.05 % ophthalmic solution Place 1 drop into both eyes 2 (two) times daily as needed. For itching/allergy symptoms 07/14/19  Yes [name], MD  Cholecalciferol (VITAMIN D3) 50 MCG (2000 UT) TABS Take 2,000 Units by mouth daily.   Yes [provider]  clobetasol cream (TEMOVATE) 0.05 % APPLY TOPICALLY TO AFFECTED AREA(S) NIGHTLY AT BEDTIME 08/30/20  Yes Lavonna Monarch, MD  Clobetasol Prop Emollient Base (CLOBETASOL PROPIONATE E) 0.05 % emollient cream Apply 1 application topically at bedtime. 08/22/21  Yes Lavonna Monarch, MD  cyclobenzaprine (FLEXERIL) 5 MG tablet take one tablet by mouth three times daily as needed for muscle spasm. start at bedtime due to sedation 01/12/20  Yes [name], MD  esomeprazole (KLS ESOMEPRAZOLE MAGNESIUM) 20 MG capsule Take 1 capsule (20 mg total) by mouth daily. 07/24/22  Yes [name], MD  fluconazole (DIFLUCAN) 150 MG tablet Take 1 tablet (150 mg total) by mouth daily. 09/08/22  Yes [name], MD  KLS ALLER-FLO 50 MCG/ACT nasal spray  PLACE TWO SPRAYS INTO BOTH NOSTRILS ONCE A DAY 06/12/22  Yes [name], MD  losartan (COZAAR) 25 MG tablet Take 1 tablet (25 mg total) by mouth daily. 08/03/22  Yes [name], MD  losartan (COZAAR) 50 MG tablet Take 1 tablet (50 mg total) by mouth daily. 08/23/22  Yes [name], MD  meloxicam (MOBIC) 15 MG tablet Take 1 tablet (15 mg total) by mouth daily as needed for pain. 09/04/22 09/04/23 Yes Trula Slade, DPM  Menthol-Methyl Salicylate (SALONPAS PAIN RELIEF PATCH EX) Place 1 patch onto the skin daily as needed (pain.).   Yes [provider]  methocarbamol (ROBAXIN) 500 MG tablet Take 500 mg by mouth 3 (three) times daily. 08/02/21  Yes [provider]  pravastatin (PRAVACHOL) 20 MG tablet Take 1 tablet (20 mg total) by mouth daily. 07/24/22  Yes [name], MD  Tavaborole (KERYDIN) 5 % SOLN Apply 1 drop topically daily. Apply 1 drop  to the toenail daily. 06/15/22  Yes Trula Slade, DPM  triamcinolone cream (KENALOG) 0.1 % Apply 1 application topically 2 (two) times daily as needed. To affected areas only as needed. 01/08/19  Yes [name], MD  ANORO ELLIPTA 62.5-25 MCG/INH AEPB INHALE ONE PUFF BY MOUTH INTO THE LUNGS ONE TIME DAILY Patient not taking: Reported on 09/13/2022 10/20/20   Collene Gobble, MD  celecoxib (CELEBREX) 200 MG capsule Take 1 capsule (200 mg total) by mouth 2 (two) times daily. Patient not taking: Reported on 07/24/2022 06/18/19   Irving Copas, PA-C  clindamycin (CLEOCIN) 300 MG capsule Take 1 capsule (300 mg total) by mouth 3 (three) times daily. Patient not taking: Reported on 08/14/2022 01/12/21   Trula Slade, DPM  FLOWFLEX COVID-19 [place] TEST KIT  11/09/20   [provider]  HYDROcodone-acetaminophen (NORCO/VICODIN) 5-325 MG tablet Take 1 tablet by mouth every 6 (six) hours as needed. Patient not taking: Reported on 08/14/2022 02/21/21   Trula Slade, DPM  INCRUSE ELLIPTA 62.5 MCG/INH  AEPB INHALE ONE PUFF BY MOUTH INTO THE LUNGS ONCE DAILY Patient not taking: Reported on 08/14/2022 02/24/20   Collene Gobble, MD  metoprolol tartrate (LOPRESSOR) 50 MG tablet Take 1 tablet (50 mg total) by mouth once for 1 dose. PLEASE TAKE METOPROLOL 2  HOURS PRIOR TO CTA SCAN. 08/07/22 08/07/22  Janina Mayo, MD  promethazine (PHENERGAN) 25 MG tablet Take 1 tablet (25 mg total) by mouth every 8 (eight) hours as needed for nausea or vomiting. Patient not taking: Reported on 08/14/2022 01/12/21   Trula Slade, DPM  terbinafine (LAMISIL) 250 MG tablet Take 1 tablet (250 mg total) by mouth daily. Patient not taking: Reported on 08/14/2022 03/15/22   Trula Slade, DPM   Social History   Socioeconomic History   Marital status: Married    Spouse name: Not on file   Number of children: Not on file   Years of education: Not on file   Highest education level: Not on file  Occupational History   Not on file  Tobacco Use   Smoking status: Former    Packs/day: 1.50    Years: 20.00    Total pack years: 30.00    Types: Cigarettes    Quit date: 11/05/1999    Years since quitting: 22.8   Smokeless tobacco: Never  Vaping Use   Vaping Use: Never used  Substance and Sexual Activity   Alcohol use: No    Comment: last used 25 years ago    Drug use: Yes    Types: Cocaine    Comment: last used cocaine  and acid 25 yrs ago,none used since   Sexual activity: Not on file  Other Topics Concern   Not on file  Social History Narrative   Not on file   Social Determinants of Health   Financial Resource Strain: Not on file  Food Insecurity: Not on file  Transportation Needs: Not on file  Physical Activity: Not on file  Stress: Not on file  Social Connections: Not on file  Intimate Partner Violence: Not on file    Review of Systems  Constitutional:  Negative for fatigue and unexpected weight change.  Eyes:  Negative for visual disturbance.  Respiratory:  Negative for cough, chest  tightness and shortness of breath.   Cardiovascular:  Negative for chest pain, palpitations and leg swelling.  Gastrointestinal:  Negative for abdominal pain and blood in stool.  Neurological:  Negative  for dizziness, light-headedness and headaches.     Objective:   Vitals:   09/13/22 1518  BP: 138/74  Pulse: 82  Temp: 98.2 F (36.8 C)  SpO2: 96%  Weight: 226 lb 12.8 oz (102.9 kg)  Height: _0  (1.854 m)     Physical Exam Vitals reviewed.  Constitutional:      Appearance: He is well-developed.  HENT:     Head: Normocephalic and atraumatic.     Mouth/Throat:     Comments: Moist oral mucosa without significant white patches noted.  Few possible small areas on time, not on buccal mucosa or posterior oropharynx. Neck:     Vascular: No carotid bruit or JVD.  Cardiovascular:     Rate and Rhythm: Normal rate and regular rhythm.     Heart sounds: Normal heart sounds. No murmur heard. Pulmonary:     Effort: Pulmonary effort is normal.     Breath sounds: Normal breath sounds. No rales.  Musculoskeletal:     Right lower leg: No edema.     Left lower leg: No edema.  Skin:    General: Skin is warm and dry.  Neurological:     Mental Status: He is alert and oriented to person, place, and time.  Psychiatric:        Mood and Affect: Mood normal.        Assessment & Plan:  [name] is a 71 y.o. male . Thrush - Plan: fluconazole (DIFLUCAN) 150 MG tablet  -Likely due to use of steroid inhaler, stressed importance of rinsing mouth afterwards.  Improving with Diflucan, will continue for additional 1 week.  RTC precautions.  Essential hypertension - Plan: losartan (COZAAR) 50 MG tablet  -Borderline control, continue same regimen with amlodipine and losartan at this time with handout given on management of hypertension, avoidance of high sodium foods, option of higher dosing of losartan if persistent elevations.  RTC precautions.  Meds ordered this encounter  Medications    fluconazole (DIFLUCAN) 150 MG tablet    Sig: Take 1 tablet (150 mg total) by mouth daily.    Dispense:  7 tablet    Refill:  0   losartan (COZAAR) 50 MG tablet    Sig: Take 1 tablet (50 mg total) by mouth daily.    Dispense:  90 tablet    Refill:  1   Patient Instructions  Ideally I would like to see blood pressure under 130/80. If blood pressure remains elevated in next few weeks, I can increase losartan further, but see information below on managing your blood pressure and try to avoid the higher salt foods on the handout. For now stay on both amlodipine 49m per day and losartan 573mper day.   Ok to continue 1 additional week of diflucan for thrush. Make sure to rinse mouth every time after steroid inhaler.    Managing Your Hypertension Hypertension, also called high blood pressure, is when the force of the blood pressing against the walls of the arteries is too strong. Arteries are blood vessels that carry blood from your heart throughout your body. Hypertension forces the heart to work harder to pump blood and may cause the arteries to become narrow or stiff. Understanding blood pressure readings A blood pressure reading includes a higher number over a lower number: The first, or top, number is called the systolic pressure. It is a measure of the pressure in your arteries as your heart beats. The second, or bottom number, is called the diastolic  pressure. It is a measure of the pressure in your arteries as the heart relaxes. For most people, a normal blood pressure is below 120/80. Your personal target blood pressure may vary depending on your medical conditions, your age, and other factors. Blood pressure is classified into four stages. Based on your blood pressure reading, your health care provider may use the following stages to determine what type of treatment you need, if any. Systolic pressure and diastolic pressure are measured in a unit called millimeters of mercury  (mmHg). Normal Systolic pressure: below 841. Diastolic pressure: below 80. Elevated Systolic pressure: 660-630. Diastolic pressure: below 80. Hypertension stage 1 Systolic pressure: 160-109. Diastolic pressure: 32-35. Hypertension stage 2 Systolic pressure: 573 or above. Diastolic pressure: 90 or above. How can this condition affect me? Managing your hypertension is very important. Over time, hypertension can damage the arteries and decrease blood flow to parts of the body, including the brain, heart, and kidneys. Having untreated or uncontrolled hypertension can lead to: A heart attack. A stroke. A weakened blood vessel (aneurysm). Heart failure. Kidney damage. Eye damage. Memory and concentration problems. Vascular dementia. What actions can I take to manage this condition? Hypertension can be managed by making lifestyle changes and possibly by taking medicines. Your health care provider will help you make a plan to bring your blood pressure within a normal range. You may be referred for counseling on a healthy diet and physical activity. Nutrition  Eat a diet that is high in fiber and potassium, and low in salt (sodium), added sugar, and fat. An example eating plan is called the DASH diet. DASH stands for Dietary Approaches to Stop Hypertension. To eat this way: Eat plenty of fresh fruits and vegetables. Try to fill one-half of your plate at each meal with fruits and vegetables. Eat whole grains, such as whole-wheat pasta, brown rice, or whole-grain bread. Fill about one-fourth of your plate with whole grains. Eat low-fat dairy products. Avoid fatty cuts of meat, processed or cured meats, and poultry with skin. Fill about one-fourth of your plate with lean proteins such as fish, chicken without skin, beans, eggs, and tofu. Avoid pre-made and processed foods. These tend to be higher in sodium, added sugar, and fat. Reduce your daily sodium intake. Many people with hypertension  should eat less than 1,500 mg of sodium a day. Lifestyle  Work with your health care provider to maintain a healthy body weight or to lose weight. Ask what an ideal weight is for you. Get at least 30 minutes of exercise that causes your heart to beat faster (aerobic exercise) most days of the week. Activities may include walking, swimming, or biking. Include exercise to strengthen your muscles (resistance exercise), such as weight lifting, as part of your weekly exercise routine. Try to do these types of exercises for 30 minutes at least 3 days a week. Do not use any products that contain nicotine or tobacco. These products include cigarettes, chewing tobacco, and vaping devices, such as e-cigarettes. If you need help quitting, ask your health care provider. Control any long-term (chronic) conditions you have, such as high cholesterol or diabetes. Identify your sources of stress and find ways to manage stress. This may include meditation, deep breathing, or making time for fun activities. Alcohol use Do not drink alcohol if: Your health care provider tells you not to drink. You are pregnant, may be pregnant, or are planning to become pregnant. If you drink alcohol: Limit how much you have to: 0-1 drink  a day for women. 0-2 drinks a day for men. Know how much alcohol is in your drink. In the [place] one drink equals one 12 oz bottle of beer (355 mL), one 5 oz glass of wine (148 mL), or one 1 oz glass of hard liquor (44 mL). Medicines Your health care provider may prescribe medicine if lifestyle changes are not enough to get your blood pressure under control and if: Your systolic blood pressure is 130 or higher. Your diastolic blood pressure is 80 or higher. Take medicines only as told by your health care provider. Follow the directions carefully. Blood pressure medicines must be taken as told by your health care provider. The medicine does not work as well when you skip doses. Skipping doses also  puts you at risk for problems. Monitoring Before you monitor your blood pressure: Do not smoke, drink caffeinated beverages, or exercise within 30 minutes before taking a measurement. Use the bathroom and empty your bladder (urinate). Sit quietly for at least 5 minutes before taking measurements. Monitor your blood pressure at home as told by your health care provider. To do this: Sit with your back straight and supported. Place your feet flat on the floor. Do not cross your legs. Support your arm on a flat surface, such as a table. Make sure your upper arm is at heart level. Each time you measure, take two or three readings one minute apart and record the results. You may also need to have your blood pressure checked regularly by your health care provider. General information Talk with your health care provider about your diet, exercise habits, and other lifestyle factors that may be contributing to hypertension. Review all the medicines you take with your health care provider because there may be side effects or interactions. Keep all follow-up visits. Your health care provider can help you create and adjust your plan for managing your high blood pressure. Where to find more information National Heart, Lung, and Blood Institute: https://wilson-eaton.com/ American Heart Association: www.heart.org Contact a health care provider if: You think you are having a reaction to medicines you have taken. You have repeated (recurrent) headaches. You feel dizzy. You have swelling in your ankles. You have trouble with your vision. Get help right away if: You develop a severe headache or confusion. You have unusual weakness or numbness, or you feel faint. You have severe pain in your chest or abdomen. You vomit repeatedly. You have trouble breathing. These symptoms may be an emergency. Get help right away. Call 911. Do not wait to see if the symptoms will go away. Do not drive yourself to the  hospital. Summary Hypertension is when the force of blood pumping through your arteries is too strong. If this condition is not controlled, it may put you at risk for serious complications. Your personal target blood pressure may vary depending on your medical conditions, your age, and other factors. For most people, a normal blood pressure is less than 120/80. Hypertension is managed by lifestyle changes, medicines, or both. Lifestyle changes to help manage hypertension include losing weight, eating a healthy, low-sodium diet, exercising more, stopping smoking, and limiting alcohol. This information is not intended to replace advice given to you by your health care provider. Make sure you discuss any questions you have with your health care provider. Document Revised: 05/19/2021 Document Reviewed: 05/19/2021 Elsevier Patient Education  [place], Adult Oral thrush, also called oral candidiasis, is a fungal infection that develops in  the mouth and throat and on the tongue. It causes white patches to form in the mouth and on the tongue. Many cases of thrush are mild, but this infection can also be serious. Ritta Slot can be a repeated (recurrent) problem for certain people who have a weak body defense system (immune system). The weakness can be caused by chronic illnesses, or by taking medicines that limit the body's ability to fight infection. If a person has difficulty fighting infection, the fungus that causes thrush can spread through the body. This can cause life-threatening blood or organ infections. What are the causes? This condition is caused by a fungus (yeast) called Candida albicans. This fungus is normally present in small amounts in the mouth and on other mucous membranes. It usually causes no harm. If conditions are present that allow the fungus to grow without control, it invades surrounding tissues and becomes an infection. Other Candida species can also lead  to thrush, though this is rare. What increases the risk? The following factors may make you more likely to develop this condition: Having a weakened immune system. Being an older adult. Having diabetes, cancer, or HIV (human immunodeficiency virus). Having dry mouth (xerostomia). Being pregnant or breastfeeding. Having poor dental care, especially in those who have dentures. Using antibiotic or steroid medicines. What are the signs or symptoms? Symptoms of this condition can vary from mild and moderate to severe and persistent. Symptoms may include: A burning feeling in the mouth and throat. This can occur at the start of a thrush infection. White patches that stick to the mouth and tongue. The tissue around the patches may be red, raw, and painful. If rubbed (during tooth brushing, for example), the patches and the tissue of the mouth may bleed easily. A bad taste in the mouth or difficulty tasting foods. A cottony feeling in the mouth. Pain during eating and swallowing. Poor appetite. Cracking at the corners of the mouth. How is this diagnosed? This condition is diagnosed based on: A physical exam. Your medical history. How is this treated? This condition is treated with medicines called antifungals, which prevent the growth of fungi. These medicines are either applied directly to the affected area (topical) or swallowed (oral). The treatment will depend on the severity of the condition. Mild cases of thrush may be treated with an antifungal mouth rinse or lozenges. Treatment usually lasts about 14 days. Moderate to severe cases of thrush can be treated with oral antifungal medicine, if they have spread to the esophagus. A topical antifungal medicine may also be used. For some severe infections, treatment may need to continue for more than 14 days. Oral antifungal medicines are rarely used during pregnancy because they may be harmful to the unborn child. If you are pregnant, talk with  your health care provider about options for treatment. Persistent or recurrent thrush. For cases of thrush that do not go away or keep coming back: Treatment may be needed twice as long as the symptoms last. Treatment will include both oral and topical antifungal medicines. People with a weakened immune system can take an antifungal medicine on a continuous basis to prevent thrush infections. It is important to treat conditions that make a person more likely to get thrush, such as diabetes or HIV. Follow these instructions at home: Relieving soreness and discomfort To help reduce the discomfort of thrush: Drink cold liquids such as water or iced tea. Try flavored ice treats or frozen juices. Eat foods that are easy to swallow, such  as gelatin, ice cream, or custard. Try drinking from a straw if the patches in your mouth are painful.  General instructions Take or use over-the-counter and prescription medicines only as told by your health care provider. Eat plain, unflavored yogurt as directed by your health care provider. Check the label to make sure the yogurt contains live cultures. This yogurt can help healthy bacteria grow in the mouth and can stop the growth of the fungus that causes thrush. If you wear dentures, remove the dentures before going to bed, brush them vigorously, and soak them in a cleaning solution as directed by your health care provider. Rinse your mouth with a warm salt-water mixture several times a day. To make a salt-water mixture, dissolve -1 tsp (3-6 g) of salt in 1 cup (237 mL) of warm water. Contact a health care provider if: Your symptoms are getting worse or are not improving within 7 days of starting treatment. You have symptoms of a spreading infection, such as white patches on the skin outside of the mouth. You are breastfeeding your baby and you have redness and pain in the nipples. Summary Oral thrush, also called oral candidiasis, is a fungal infection that  develops in the mouth and throat and on the tongue. It causes white patches to form in the mouth and on the tongue. You are more likely to get this condition if you have a weakened immune system or an underlying condition, such as HIV, cancer, or diabetes. This condition is treated with medicines called antifungals, which prevent the growth of fungi. Contact a health care provider if your symptoms do not improve, or get worse, within 7 days of starting treatment. This information is not intended to replace advice given to you by your health care provider. Make sure you discuss any questions you have with your health care provider. Document Revised: 05/04/2021 Document Reviewed: 07/11/2019 Elsevier Patient Education  [place],   [name], MD [place] 09/13/22 4:40 PM

## 2022-09-13 NOTE — Patient Instructions (Addendum)
Ideally I would like to see blood pressure under 130/80. If blood pressure remains elevated in next few weeks, I can increase losartan further, but see information below on managing your blood pressure and try to avoid the higher salt foods on the handout. For now stay on both amlodipine '10mg'$  per day and losartan '50mg'$  per day.   Ok to continue 1 additional week of diflucan for thrush. Make sure to rinse mouth every time after steroid inhaler.    Managing Your Hypertension Hypertension, also called high blood pressure, is when the force of the blood pressing against the walls of the arteries is too strong. Arteries are blood vessels that carry blood from your heart throughout your body. Hypertension forces the heart to work harder to pump blood and may cause the arteries to become narrow or stiff. Understanding blood pressure readings A blood pressure reading includes a higher number over a lower number: The first, or top, number is called the systolic pressure. It is a measure of the pressure in your arteries as your heart beats. The second, or bottom number, is called the diastolic pressure. It is a measure of the pressure in your arteries as the heart relaxes. For most people, a normal blood pressure is below 120/80. Your personal target blood pressure may vary depending on your medical conditions, your age, and other factors. Blood pressure is classified into four stages. Based on your blood pressure reading, your health care provider may use the following stages to determine what type of treatment you need, if any. Systolic pressure and diastolic pressure are measured in a unit called millimeters of mercury (mmHg). Normal Systolic pressure: below 119. Diastolic pressure: below 80. Elevated Systolic pressure: 417-408. Diastolic pressure: below 80. Hypertension stage 1 Systolic pressure: 144-818. Diastolic pressure: 56-31. Hypertension stage 2 Systolic pressure: 497 or above. Diastolic  pressure: 90 or above. How can this condition affect me? Managing your hypertension is very important. Over time, hypertension can damage the arteries and decrease blood flow to parts of the body, including the brain, heart, and kidneys. Having untreated or uncontrolled hypertension can lead to: A heart attack. A stroke. A weakened blood vessel (aneurysm). Heart failure. Kidney damage. Eye damage. Memory and concentration problems. Vascular dementia. What actions can I take to manage this condition? Hypertension can be managed by making lifestyle changes and possibly by taking medicines. Your health care provider will help you make a plan to bring your blood pressure within a normal range. You may be referred for counseling on a healthy diet and physical activity. Nutrition  Eat a diet that is high in fiber and potassium, and low in salt (sodium), added sugar, and fat. An example eating plan is called the DASH diet. DASH stands for Dietary Approaches to Stop Hypertension. To eat this way: Eat plenty of fresh fruits and vegetables. Try to fill one-half of your plate at each meal with fruits and vegetables. Eat whole grains, such as whole-wheat pasta, brown rice, or whole-grain bread. Fill about one-fourth of your plate with whole grains. Eat low-fat dairy products. Avoid fatty cuts of meat, processed or cured meats, and poultry with skin. Fill about one-fourth of your plate with lean proteins such as fish, chicken without skin, beans, eggs, and tofu. Avoid pre-made and processed foods. These tend to be higher in sodium, added sugar, and fat. Reduce your daily sodium intake. Many people with hypertension should eat less than 1,500 mg of sodium a day. Lifestyle  Work with your health care  provider to maintain a healthy body weight or to lose weight. Ask what an ideal weight is for you. Get at least 30 minutes of exercise that causes your heart to beat faster (aerobic exercise) most days of the  week. Activities may include walking, swimming, or biking. Include exercise to strengthen your muscles (resistance exercise), such as weight lifting, as part of your weekly exercise routine. Try to do these types of exercises for 30 minutes at least 3 days a week. Do not use any products that contain nicotine or tobacco. These products include cigarettes, chewing tobacco, and vaping devices, such as e-cigarettes. If you need help quitting, ask your health care provider. Control any long-term (chronic) conditions you have, such as high cholesterol or diabetes. Identify your sources of stress and find ways to manage stress. This may include meditation, deep breathing, or making time for fun activities. Alcohol use Do not drink alcohol if: Your health care provider tells you not to drink. You are pregnant, may be pregnant, or are planning to become pregnant. If you drink alcohol: Limit how much you have to: 0-1 drink a day for women. 0-2 drinks a day for men. Know how much alcohol is in your drink. In the [place] one drink equals one 12 oz bottle of beer (355 mL), one 5 oz glass of wine (148 mL), or one 1 oz glass of hard liquor (44 mL). Medicines Your health care provider may prescribe medicine if lifestyle changes are not enough to get your blood pressure under control and if: Your systolic blood pressure is 130 or higher. Your diastolic blood pressure is 80 or higher. Take medicines only as told by your health care provider. Follow the directions carefully. Blood pressure medicines must be taken as told by your health care provider. The medicine does not work as well when you skip doses. Skipping doses also puts you at risk for problems. Monitoring Before you monitor your blood pressure: Do not smoke, drink caffeinated beverages, or exercise within 30 minutes before taking a measurement. Use the bathroom and empty your bladder (urinate). Sit quietly for at least 5 minutes before taking  measurements. Monitor your blood pressure at home as told by your health care provider. To do this: Sit with your back straight and supported. Place your feet flat on the floor. Do not cross your legs. Support your arm on a flat surface, such as a table. Make sure your upper arm is at heart level. Each time you measure, take two or three readings one minute apart and record the results. You may also need to have your blood pressure checked regularly by your health care provider. General information Talk with your health care provider about your diet, exercise habits, and other lifestyle factors that may be contributing to hypertension. Review all the medicines you take with your health care provider because there may be side effects or interactions. Keep all follow-up visits. Your health care provider can help you create and adjust your plan for managing your high blood pressure. Where to find more information National Heart, Lung, and Blood Institute: https://wilson-eaton.com/ American Heart Association: www.heart.org Contact a health care provider if: You think you are having a reaction to medicines you have taken. You have repeated (recurrent) headaches. You feel dizzy. You have swelling in your ankles. You have trouble with your vision. Get help right away if: You develop a severe headache or confusion. You have unusual weakness or numbness, or you feel faint. You have severe pain in  your chest or abdomen. You vomit repeatedly. You have trouble breathing. These symptoms may be an emergency. Get help right away. Call 911. Do not wait to see if the symptoms will go away. Do not drive yourself to the hospital. Summary Hypertension is when the force of blood pumping through your arteries is too strong. If this condition is not controlled, it may put you at risk for serious complications. Your personal target blood pressure may vary depending on your medical conditions, your age, and other  factors. For most people, a normal blood pressure is less than 120/80. Hypertension is managed by lifestyle changes, medicines, or both. Lifestyle changes to help manage hypertension include losing weight, eating a healthy, low-sodium diet, exercising more, stopping smoking, and limiting alcohol. This information is not intended to replace advice given to you by your health care provider. Make sure you discuss any questions you have with your health care provider. Document Revised: 05/19/2021 Document Reviewed: 05/19/2021 Elsevier Patient Education  [place], Adult Oral thrush, also called oral candidiasis, is a fungal infection that develops in the mouth and throat and on the tongue. It causes white patches to form in the mouth and on the tongue. Many cases of thrush are mild, but this infection can also be serious. Ritta Slot can be a repeated (recurrent) problem for certain people who have a weak body defense system (immune system). The weakness can be caused by chronic illnesses, or by taking medicines that limit the body's ability to fight infection. If a person has difficulty fighting infection, the fungus that causes thrush can spread through the body. This can cause life-threatening blood or organ infections. What are the causes? This condition is caused by a fungus (yeast) called Candida albicans. This fungus is normally present in small amounts in the mouth and on other mucous membranes. It usually causes no harm. If conditions are present that allow the fungus to grow without control, it invades surrounding tissues and becomes an infection. Other Candida species can also lead to thrush, though this is rare. What increases the risk? The following factors may make you more likely to develop this condition: Having a weakened immune system. Being an older adult. Having diabetes, cancer, or HIV (human immunodeficiency virus). Having dry mouth (xerostomia). Being  pregnant or breastfeeding. Having poor dental care, especially in those who have dentures. Using antibiotic or steroid medicines. What are the signs or symptoms? Symptoms of this condition can vary from mild and moderate to severe and persistent. Symptoms may include: A burning feeling in the mouth and throat. This can occur at the start of a thrush infection. White patches that stick to the mouth and tongue. The tissue around the patches may be red, raw, and painful. If rubbed (during tooth brushing, for example), the patches and the tissue of the mouth may bleed easily. A bad taste in the mouth or difficulty tasting foods. A cottony feeling in the mouth. Pain during eating and swallowing. Poor appetite. Cracking at the corners of the mouth. How is this diagnosed? This condition is diagnosed based on: A physical exam. Your medical history. How is this treated? This condition is treated with medicines called antifungals, which prevent the growth of fungi. These medicines are either applied directly to the affected area (topical) or swallowed (oral). The treatment will depend on the severity of the condition. Mild cases of thrush may be treated with an antifungal mouth rinse or lozenges. Treatment usually lasts about 14  days. Moderate to severe cases of thrush can be treated with oral antifungal medicine, if they have spread to the esophagus. A topical antifungal medicine may also be used. For some severe infections, treatment may need to continue for more than 14 days. Oral antifungal medicines are rarely used during pregnancy because they may be harmful to the unborn child. If you are pregnant, talk with your health care provider about options for treatment. Persistent or recurrent thrush. For cases of thrush that do not go away or keep coming back: Treatment may be needed twice as long as the symptoms last. Treatment will include both oral and topical antifungal medicines. People with a  weakened immune system can take an antifungal medicine on a continuous basis to prevent thrush infections. It is important to treat conditions that make a person more likely to get thrush, such as diabetes or HIV. Follow these instructions at home: Relieving soreness and discomfort To help reduce the discomfort of thrush: Drink cold liquids such as water or iced tea. Try flavored ice treats or frozen juices. Eat foods that are easy to swallow, such as gelatin, ice cream, or custard. Try drinking from a straw if the patches in your mouth are painful.  General instructions Take or use over-the-counter and prescription medicines only as told by your health care provider. Eat plain, unflavored yogurt as directed by your health care provider. Check the label to make sure the yogurt contains live cultures. This yogurt can help healthy bacteria grow in the mouth and can stop the growth of the fungus that causes thrush. If you wear dentures, remove the dentures before going to bed, brush them vigorously, and soak them in a cleaning solution as directed by your health care provider. Rinse your mouth with a warm salt-water mixture several times a day. To make a salt-water mixture, dissolve -1 tsp (3-6 g) of salt in 1 cup (237 mL) of warm water. Contact a health care provider if: Your symptoms are getting worse or are not improving within 7 days of starting treatment. You have symptoms of a spreading infection, such as white patches on the skin outside of the mouth. You are breastfeeding your baby and you have redness and pain in the nipples. Summary Oral thrush, also called oral candidiasis, is a fungal infection that develops in the mouth and throat and on the tongue. It causes white patches to form in the mouth and on the tongue. You are more likely to get this condition if you have a weakened immune system or an underlying condition, such as HIV, cancer, or diabetes. This condition is treated with  medicines called antifungals, which prevent the growth of fungi. Contact a health care provider if your symptoms do not improve, or get worse, within 7 days of starting treatment. This information is not intended to replace advice given to you by your health care provider. Make sure you discuss any questions you have with your health care provider. Document Revised: 05/04/2021 Document Reviewed: 07/11/2019 Elsevier Patient Education  [place].

## 2022-09-16 ENCOUNTER — Encounter: Payer: Self-pay | Admitting: Family Medicine

## 2022-09-19 ENCOUNTER — Encounter: Payer: Self-pay | Admitting: Family Medicine

## 2022-09-19 NOTE — Telephone Encounter (Signed)
Seen 09/13/22 but doesn't appear swelling was addressed he will need an appointment correct?

## 2022-09-20 ENCOUNTER — Ambulatory Visit: Payer: 59 | Admitting: Emergency Medicine

## 2022-09-25 NOTE — Telephone Encounter (Signed)
Any chance he can come in Wednesday morning for me to check that swelling, especially if he is still significantly swollen after lower dose amlodipine? I have a few openings at this point.

## 2022-10-02 ENCOUNTER — Ambulatory Visit: Payer: 59 | Admitting: Family Medicine

## 2022-10-02 VITALS — BP 134/76 | HR 78 | Temp 97.8°F | Ht 73.0 in | Wt 227.8 lb

## 2022-10-02 DIAGNOSIS — M79602 Pain in left arm: Secondary | ICD-10-CM | POA: Diagnosis not present

## 2022-10-02 DIAGNOSIS — I10 Essential (primary) hypertension: Secondary | ICD-10-CM

## 2022-10-02 DIAGNOSIS — R600 Localized edema: Secondary | ICD-10-CM

## 2022-10-02 DIAGNOSIS — M7712 Lateral epicondylitis, left elbow: Secondary | ICD-10-CM

## 2022-10-02 MED ORDER — AMLODIPINE BESYLATE 5 MG PO TABS: 5.0000 mg | ORAL_TABLET | Freq: Every day | ORAL | 1 refills | Status: DC

## 2022-10-02 NOTE — Progress Notes (Unsigned)
Subjective:  Patient ID: [name], male    DOB: December 28, 1950  Age: 72 y.o. MRN: [id number]  CC:  Chief Complaint  Patient presents with   Leg Swelling    Pt notes he has some leg and ankle swelling following the start of amlodipine 2 weeks ago, notes no other concerns     HPI [name] presents for   Pedal edema Hypertension treated with amlodipine, previously 7.5 mg daily.  Blood pressure was elevated/borderline control in November and with some difficulty of combining 5 and 2.5 mg,  so we started amlodipine 10 mg daily.  He was seen in follow-up November 16 and had attributed to that change some headaches, blurry vision and dizziness.  Blood pressure had been elevated at home up to 168/80, 178/80.  Also had been drinking some energy drinks, advised cessation.  Added losartan 25 mg daily.   Cardiology eval November 20, no med changes.  Improved readings November 27.  Losartan was increased for improved control to 50 mg daily.  Follow-up December 27, blood pressures were improved in the 130s over 70-80 range.  Had also cut back on salt and no new side effects with higher dose losartan.  He did have some recent diet changes with holidays and some salt at that time.  MyChart message regarding pedal edema on January 2.  Discussed possible side effect of amlodipine versus salt in the diet.  Option to lower dose of amlodipine to 5 mg with likely increased dose of losartan.  Response on January 7, still with leg swelling, felt weak and tired.  Office visit recommended.  Both legs have felt tired and swelling in ankles since last visit. No focal weakness. No CP/dyspnea.  Has been still taking '10mg'$  amlodipine daily and '50mg'$  losartan BP 136/78 last night Has changed to pink salt. Less table salt. Still some added salt to foods.  Not eating much frozen/fast food/takeout. Salt on fries.  Standing 5 hrs at work.  Swelling better overnight but still some in the morning.   L arm  pain Noted at the end of our visit today. Noticed pain on the outside of his elbow, somewhat into his forearm and upper arm over the past month.  Has improved with Voltaren gel - once per day.  No fall/injury. Comes and goes. No brace.    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   Pulmonary nodules 02/23/2014   Tobacco use disorder, moderate, in sustained remission 02/23/2014   Recurrent right inguinal hernia 10/20/2013   ED (erectile dysfunction) 08/26/2012   Physical exam, annual 11/05/2011   HTN (hypertension) 11/05/2011   GERD (gastroesophageal reflux disease) 11/05/2011   Chronic joint pain 11/05/2011   Past Medical History:  Diagnosis Date   Allergy    seasonal   Arthritis    oa   Asthma    COPD (chronic obstructive pulmonary disease) ([place])    GERD (gastroesophageal reflux disease)    Hypertension    Prostate cancer ([place]) 01/2016   prostatectomy    Shortness of  breath dyspnea    Past Surgical History:  Procedure Laterality Date   HERNIA REPAIR  last done 3-4 yrs ago   x 2   INGUINAL HERNIA REPAIR Right 11/20/2013   Procedure: OPEN REPAIR OF RECURRENT RIGHT INGUINAL HERNIA WITH MESH;  Surgeon: [name]. [name], MD;  Location: [facility];  Service: General;  Laterality: Right;   INSERTION OF MESH Right 11/20/2013   Procedure: INSERTION OF MESH;  Surgeon: [name]. [name], MD;  Location: [facility];  Service: General;  Laterality: Right;   KNEE SURGERY Left july 2014   mcl and meniscus repair   LYMPHADENECTOMY Bilateral 02/09/2016   Procedure: PELVIC  LYMPHADENECTOMY;  Surgeon: [name], MD;  Location: [facility];  Service: Urology;  Laterality: Bilateral;   right knee arthroscopy      ROBOT ASSISTED LAPAROSCOPIC RADICAL PROSTATECTOMY N/A 02/09/2016   Procedure: XI ROBOTIC ASSISTED LAPAROSCOPIC RADICAL PROSTATECTOMY WITH INDOCYANINE GREEN DYE AND ADHESIOLYSIS;  Surgeon: [name], MD;  Location: [facility];  Service: Urology;  Laterality: N/A;   TOTAL KNEE ARTHROPLASTY Right 09/06/2015   Procedure: RIGHT TOTAL KNEE ARTHROPLASTY;  Surgeon: [name], MD;  Location: [facility];  Service: Orthopedics;  Laterality: Right;   TOTAL KNEE ARTHROPLASTY Left 06/17/2019   Procedure: TOTAL KNEE ARTHROPLASTY;  Surgeon: [name], MD;  Location: [facility];  Service: Orthopedics;  Laterality: Left;  70 mins   Allergies  Allergen Reactions   Percocet [Oxycodone-Acetaminophen] Other (See Comments)    Nausea-Doesn't work   Amoxicillin Rash    Has patient had a PCN reaction causing immediate rash, facial/tongue/throat swelling, SOB or lightheadedness with hypotension: No Has patient had a PCN reaction causing severe rash involving mucus membranes or skin necrosis: No Has patient had a PCN reaction that required hospitalization No Has patient had a PCN reaction occurring within the last 10 years: Yes- 2016 If all of the above answers are "NO", then may proceed with Cephalosporin use.    Prior to Admission medications   Medication Sig Start Date End Date Taking? Authorizing Provider  albuterol (PROVENTIL HFA) 108 (90 Base) MCG/ACT inhaler Proventil HFA 90 mcg/actuation aerosol inhaler   2 puffs by inhalation route every 4-6 hours as needed. 09/15/20  Yes Collene Gobble, MD  amLODipine (NORVASC) 10 MG tablet Take 1 tablet (10 mg total) by mouth daily. 07/24/22  Yes [name], MD  ANORO ELLIPTA 62.5-25 MCG/ACT AEPB INHALE 1 PUFF INTO THE LUNGS BY MOUTH ONCE A DAY 08/28/22  Yes Byrum, Rose Fillers, MD  azelastine (OPTIVAR) 0.05 % ophthalmic solution Place  1 drop into both eyes 2 (two) times daily as needed. For itching/allergy symptoms 07/14/19  Yes [name], MD  Cholecalciferol (VITAMIN D3) 50 MCG (2000 UT) TABS Take 2,000 Units by mouth daily.   Yes [provider]  clobetasol cream (TEMOVATE) 0.05 % APPLY TOPICALLY TO AFFECTED AREA(S) NIGHTLY AT BEDTIME 08/30/20  Yes Lavonna Monarch, MD  Clobetasol Prop Emollient Base (CLOBETASOL PROPIONATE E) 0.05 % emollient cream Apply 1 application topically at bedtime. 08/22/21  Yes Lavonna Monarch, MD  cyclobenzaprine (FLEXERIL) 5 MG tablet take one tablet by mouth three times daily as needed for muscle spasm. start at bedtime due to sedation 01/12/20  Yes [name], MD  esomeprazole (KLS ESOMEPRAZOLE MAGNESIUM) 20 MG capsule Take 1 capsule (20 mg total) by mouth daily. 07/24/22  Yes [name], MD  fluconazole (DIFLUCAN) 150 MG tablet Take 1 tablet (150 mg total) by mouth daily. 09/13/22  Yes Carlota Raspberry,  Ranell Patrick, MD  KLS ALLER-FLO 50 MCG/ACT nasal spray PLACE TWO SPRAYS INTO BOTH NOSTRILS ONCE A DAY 06/12/22  Yes [name], MD  losartan (COZAAR) 50 MG tablet Take 1 tablet (50 mg total) by mouth daily. 09/13/22  Yes [name], MD  meloxicam (MOBIC) 15 MG tablet Take 1 tablet (15 mg total) by mouth daily as needed for pain. 09/04/22 09/04/23 Yes Trula Slade, DPM  Menthol-Methyl Salicylate (SALONPAS PAIN RELIEF PATCH EX) Place 1 patch onto the skin daily as needed (pain.).   Yes [provider]  methocarbamol (ROBAXIN) 500 MG tablet Take 500 mg by mouth 3 (three) times daily. 08/02/21  Yes [provider]  pravastatin (PRAVACHOL) 20 MG tablet Take 1 tablet (20 mg total) by mouth daily. 07/24/22  Yes [name], MD  Tavaborole (KERYDIN) 5 % SOLN Apply 1 drop topically daily. Apply 1 drop to the toenail daily. 06/15/22  Yes Trula Slade, DPM  triamcinolone cream (KENALOG) 0.1 % Apply 1 application topically 2 (two) times daily as needed.  To affected areas only as needed. 01/08/19  Yes [name], MD  metoprolol tartrate (LOPRESSOR) 50 MG tablet Take 1 tablet (50 mg total) by mouth once for 1 dose. PLEASE TAKE METOPROLOL 2  HOURS PRIOR TO CTA SCAN. 08/07/22 08/07/22  Janina Mayo, MD   Social History   Socioeconomic History   Marital status: Married    Spouse name: Not on file   Number of children: Not on file   Years of education: Not on file   Highest education level: Not on file  Occupational History   Not on file  Tobacco Use   Smoking status: Former    Packs/day: 1.50    Years: 20.00    Total pack years: 30.00    Types: Cigarettes    Quit date: 11/05/1999    Years since quitting: 22.9   Smokeless tobacco: Never  Vaping Use   Vaping Use: Never used  Substance and Sexual Activity   Alcohol use: No    Comment: last used 25 years ago    Drug use: Yes    Types: Cocaine    Comment: last used cocaine  and acid 25 yrs ago,none used since   Sexual activity: Not on file  Other Topics Concern   Not on file  Social History Narrative   Not on file   Social Determinants of Health   Financial Resource Strain: Not on file  Food Insecurity: Not on file  Transportation Needs: Not on file  Physical Activity: Not on file  Stress: Not on file  Social Connections: Not on file  Intimate Partner Violence: Not on file    Review of Systems   Objective:   Vitals:   10/02/22 1411  BP: 134/76  Pulse: 78  Temp: 97.8 F (36.6 C)  TempSrc: Oral  SpO2: 97%  Weight: 227 lb 12.8 oz (103.3 kg)  Height: '6\' 1"'$  (1.854 m)     Physical Exam Vitals reviewed.  Constitutional:      Appearance: He is well-developed.  HENT:     Head: Normocephalic and atraumatic.  Neck:     Vascular: No carotid bruit or JVD.  Cardiovascular:     Rate and Rhythm: Normal rate and regular rhythm.     Heart sounds: Normal heart sounds. No murmur heard. Pulmonary:     Effort: Pulmonary effort is normal.     Breath sounds: Normal  breath sounds. No rales.  Musculoskeletal:     Right lower leg: Edema (1+ pedal edema to the lower third of the tibia, skin intact, no stasis changes.) present.     Left lower leg: Edema present.     Comments: Primarily.  Discomfort at the lateral epicondyle.  Pain with resisted extension.  Negative crepitance.  No other focal bony tenderness including olecranon, or medial epicondyle of the elbow.  Intact range of motion.  Neurovascular intact distally.  Skin:    General: Skin is warm and dry.  Neurological:     Mental Status: He is alert and oriented to person, place, and time.  Psychiatric:        Mood and Affect: Mood normal.     Assessment & Plan:  [name] is a 72 y.o. male . Essential hypertension - Plan: amLODipine (NORVASC) 5 MG tablet Pedal edema  -Pedal edema may be multifactorial, including amlodipine and sodium intake.  Decrease sodium recommended, handout given.  Will decrease amlodipine to 5 mg currently, home monitoring of blood pressure with option of addition of 25 mg losartan to his current 50 mg dose.  He will let me know if he does need to make that change.  RTC precautions.  Recheck 1 month  Left arm pain Lateral epicondylitis of left elbow  -Appears to be primarily lateral epicondylitis with some surrounding discomfort, possible overuse syndrome.  No focal bony tenderness other than lateral epicondyle.  No known injury.  Full range of motion.  Imaging deferred.  Counterforce bracing discussed and handout given on lateral epicondylitis.  Recheck next few weeks if not improving.  Okay to use topical Voltaren gel as that has been providing some improvement.  Meds ordered this encounter  Medications   amLODipine (NORVASC) 5 MG tablet    Sig: Take 1 tablet (5 mg total) by mouth daily.    Dispense:  90 tablet    Refill:  1   Patient Instructions  Cut back to '5mg'$  of amlodipine, but cut back on added salt. Try a salt substitute if possible. Keep a record of your  blood pressures outside of the office and if over 140/90 - add '25mg'$  losartan (total dose of '75mg'$ ) and let me know if you make that change and I will send more in. I expect swelling to improve with this plan.  Recheck in 1 month, sooner if swelling does not improve.   See information below on tennis elbow or lateral epicondylitis.  Okay to continue Voltaren gel, you can also use a counterforce brace over-the-counter or tennis elbow splint that can be helpful, just placed that beyond the area of soreness at the elbow.  Follow-up in the next 2 weeks if that is not improving.  Take care!   Tennis Elbow  Tennis elbow (lateral epicondylitis) is inflammation of tendons in your outer forearm, near your elbow. Tendons are tissues that connect muscle to bone. When you have tennis elbow, inflammation affects the tendons that you use to bend your wrist and move your hand up. Inflammation occurs in the lower part of the upper arm bone (humerus), where the tendons connect to the bone (lateral epicondyle). Tennis elbow often affects people who play tennis, but anyone may get the condition from repeatedly extending the wrist or turning the forearm. What are the causes? This condition is usually caused by repeatedly extending the wrist, turning the forearm, and using the hands. It can result from sports or work that requires repetitive forearm movements. In some cases, it may  be caused by a sudden injury. What increases the risk? You are more likely to develop tennis elbow if you play tennis or another racket sport. You also have a higher risk if you frequently use your hands for work. Besides people who play tennis, others at greater risk include: People who use computers. Architect workers. People who work in [employer]. Musicians. Cooks. Cashiers. What are the signs or symptoms? Symptoms of this condition include: Pain and tenderness in the forearm and the outer part of the elbow. Pain may be felt only when  using the arm, or it may be there all the time. A burning feeling that starts in the elbow and spreads down the forearm. A weak grip in the hand. How is this diagnosed? This condition is diagnosed based on your symptoms, your medical history, and a physical exam. You may also have X-rays or an MRI to: Confirm the diagnosis. Look for other issues. Check for tears in the ligaments, muscles, or tendons. How is this treated? Resting and icing your arm is often the first treatment. Your health care provider may also recommend: Medicines to reduce pain and inflammation. These may be in the form of a pill, topical gels, or shots of a steroid medicine (cortisone). An elbow strap to reduce stress on the area. Physical therapy. This may include massage or exercises or both. An elbow brace to restrict the movements that cause symptoms. If these treatments do not help relieve your symptoms, your health care provider may recommend surgery to remove damaged muscle and reattach healthy muscle to bone. Follow these instructions at home: If you have a brace or strap: Wear the brace or strap as told by your health care provider. Remove it only as told by your health care provider. Check the skin around the brace or strap every day. Tell your health care provider about any concerns. Loosen the brace if your fingers tingle, become numb, or turn cold and blue. Keep the brace clean. If the brace or strap is not waterproof: Do not let it get wet. Cover it with a watertight covering when you take a bath or a shower. Managing pain, stiffness, and swelling  If directed, put ice on the injured area. To do this: If you have a removable brace or strap, remove it as told by your health care provider. Put ice in a plastic bag. Place a towel between your skin and the bag. Leave the ice on for 20 minutes, 2-3 times a day. Remove the ice if your skin turns bright red. This is very important. If you cannot feel pain,  heat, or cold, you have a greater risk of damage to the area. Move your fingers often to reduce stiffness and swelling. Activity Rest your elbow and wrist and avoid activities that cause symptoms as told by your health care provider. Do physical therapy exercises as told by your health care provider. If you lift an object, lift it with your palm facing up. This reduces stress on your elbow. Lifestyle If your tennis elbow is caused by sports, check your equipment and make sure that: You use it correctly. It is good match for you. If your tennis elbow is caused by work or computer use, take frequent breaks to stretch your arm. Talk with your employer about ways to manage your condition at work. General instructions Take over-the-counter and prescription medicines only as told by your health care provider. Do not use any products that contain nicotine or tobacco. These products include  cigarettes, chewing tobacco, and vaping devices, such as e-cigarettes. If you need help quitting, ask your health care provider. Keep all follow-up visits. This is important. How is this prevented? Before and after activity: Warm up and stretch before being active. Cool down and stretch after being active. Give your body time to rest between periods of activity. During activity: Make sure to use equipment that fits you. If you play tennis, put power in your stroke with your lower body. Avoid using your arm only. Maintain physical fitness, including: Strength. Flexibility. Endurance. Do exercises to strengthen the forearm muscles. Contact a health care provider if: You have pain that gets worse or does not get better with treatment. You have numbness or weakness in your forearm, hand, or fingers. Get help right away if: Your pain is severe. You cannot move your wrist. Summary Tennis elbow (lateral epicondylitis) is inflammation of tendons in your outer forearm, near your elbow. Common symptoms include  pain and tenderness in your forearm and the outer part of your elbow. This condition is usually caused by repeatedly extending your wrist, turning your forearm, and using your hands. The first treatment is often resting and icing your arm to relieve symptoms. Further treatment may include taking medicine, getting physical therapy, wearing a brace or strap, or having surgery. This information is not intended to replace advice given to you by your health care provider. Make sure you discuss any questions you have with your health care provider. Document Revised: 03/16/2020 Document Reviewed: 03/16/2020 Elsevier Patient Education  [place].   Peripheral Edema  Peripheral edema is swelling that is caused by a buildup of fluid. Peripheral edema most often affects the lower legs, ankles, and feet. It can also develop in the arms, hands, and face. The area of the body that has peripheral edema will look swollen. It may also feel heavy or warm. Your clothes may start to feel tight. Pressing on the area may make a temporary dent in your skin (pitting edema). You may not be able to move your swollen arm or leg as much as usual. There are many causes of peripheral edema. It can happen because of a complication of other conditions such as heart failure, kidney disease, or a problem with your circulation. It also can be a side effect of certain medicines or happen because of an infection. It often happens to women during pregnancy. Sometimes, the cause is not known. Follow these instructions at home: Managing pain, stiffness, and swelling  Raise (elevate) your legs while you are sitting or lying down. Move around often to prevent stiffness and to reduce swelling. Do not sit or stand for long periods of time. Do not wear tight clothing. Do not wear garters on your upper legs. Exercise your legs to get your circulation going. This helps to move the fluid back into your blood vessels, and it may help  the swelling go down. Wear compression stockings as told by your health care provider. These stockings help to prevent blood clots and reduce swelling in your legs. It is important that these are the correct size. These stockings should be prescribed by your doctor to prevent possible injuries. If elastic bandages or wraps are recommended, use them as told by your health care provider. Medicines Take over-the-counter and prescription medicines only as told by your health care provider. Your health care provider may prescribe medicine to help your body get rid of excess water (diuretic). Take this medicine if you are told to  take it. General instructions Eat a low-salt (low-sodium) diet as told by your health care provider. Sometimes, eating less salt may reduce swelling. Pay attention to any changes in your symptoms. Moisturize your skin daily to help prevent skin from cracking and draining. Keep all follow-up visits. This is important. Contact a health care provider if: You have a fever. You have swelling in only one leg. You have increased swelling, redness, or pain in one or both of your legs. You have drainage or sores at the area where you have edema. Get help right away if: You have edema that starts suddenly or is getting worse, especially if you are pregnant or have a medical condition. You develop shortness of breath, especially when you are lying down. You have pain in your chest or abdomen. You feel weak. You feel like you will faint. These symptoms may be an emergency. Get help right away. Call 911. Do not wait to see if the symptoms will go away. Do not drive yourself to the hospital. Summary Peripheral edema is swelling that is caused by a buildup of fluid. Peripheral edema most often affects the lower legs, ankles, and feet. Move around often to prevent stiffness and to reduce swelling. Do not sit or stand for long periods of time. Pay attention to any changes in your  symptoms. Contact a health care provider if you have edema that starts suddenly or is getting worse, especially if you are pregnant or have a medical condition. Get help right away if you develop shortness of breath, especially when lying down. This information is not intended to replace advice given to you by your health care provider. Make sure you discuss any questions you have with your health care provider. Document Revised: 05/09/2021 Document Reviewed: 05/09/2021 Elsevier Patient Education  [place],   [name], MD [place], [facility] 10/02/22 3:20 PM

## 2022-10-02 NOTE — Patient Instructions (Addendum)
Cut back to '5mg'$  of amlodipine, but cut back on added salt. Try a salt substitute if possible. Keep a record of your blood pressures outside of the office and if over 140/90 - add '25mg'$  losartan (total dose of '75mg'$ ) and let me know if you make that change and I will send more in. I expect swelling to improve with this plan.  Recheck in 1 month, sooner if swelling does not improve.   See information below on tennis elbow or lateral epicondylitis.  Okay to continue Voltaren gel, you can also use a counterforce brace over-the-counter or tennis elbow splint that can be helpful, just placed that beyond the area of soreness at the elbow.  Follow-up in the next 2 weeks if that is not improving.  Take care!   Tennis Elbow  Tennis elbow (lateral epicondylitis) is inflammation of tendons in your outer forearm, near your elbow. Tendons are tissues that connect muscle to bone. When you have tennis elbow, inflammation affects the tendons that you use to bend your wrist and move your hand up. Inflammation occurs in the lower part of the upper arm bone (humerus), where the tendons connect to the bone (lateral epicondyle). Tennis elbow often affects people who play tennis, but anyone may get the condition from repeatedly extending the wrist or turning the forearm. What are the causes? This condition is usually caused by repeatedly extending the wrist, turning the forearm, and using the hands. It can result from sports or work that requires repetitive forearm movements. In some cases, it may be caused by a sudden injury. What increases the risk? You are more likely to develop tennis elbow if you play tennis or another racket sport. You also have a higher risk if you frequently use your hands for work. Besides people who play tennis, others at greater risk include: People who use computers. Architect workers. People who work in [employer]. Musicians. Cooks. Cashiers. What are the signs or symptoms? Symptoms of  this condition include: Pain and tenderness in the forearm and the outer part of the elbow. Pain may be felt only when using the arm, or it may be there all the time. A burning feeling that starts in the elbow and spreads down the forearm. A weak grip in the hand. How is this diagnosed? This condition is diagnosed based on your symptoms, your medical history, and a physical exam. You may also have X-rays or an MRI to: Confirm the diagnosis. Look for other issues. Check for tears in the ligaments, muscles, or tendons. How is this treated? Resting and icing your arm is often the first treatment. Your health care provider may also recommend: Medicines to reduce pain and inflammation. These may be in the form of a pill, topical gels, or shots of a steroid medicine (cortisone). An elbow strap to reduce stress on the area. Physical therapy. This may include massage or exercises or both. An elbow brace to restrict the movements that cause symptoms. If these treatments do not help relieve your symptoms, your health care provider may recommend surgery to remove damaged muscle and reattach healthy muscle to bone. Follow these instructions at home: If you have a brace or strap: Wear the brace or strap as told by your health care provider. Remove it only as told by your health care provider. Check the skin around the brace or strap every day. Tell your health care provider about any concerns. Loosen the brace if your fingers tingle, become numb, or turn cold and blue.  Keep the brace clean. If the brace or strap is not waterproof: Do not let it get wet. Cover it with a watertight covering when you take a bath or a shower. Managing pain, stiffness, and swelling  If directed, put ice on the injured area. To do this: If you have a removable brace or strap, remove it as told by your health care provider. Put ice in a plastic bag. Place a towel between your skin and the bag. Leave the ice on for 20  minutes, 2-3 times a day. Remove the ice if your skin turns bright red. This is very important. If you cannot feel pain, heat, or cold, you have a greater risk of damage to the area. Move your fingers often to reduce stiffness and swelling. Activity Rest your elbow and wrist and avoid activities that cause symptoms as told by your health care provider. Do physical therapy exercises as told by your health care provider. If you lift an object, lift it with your palm facing up. This reduces stress on your elbow. Lifestyle If your tennis elbow is caused by sports, check your equipment and make sure that: You use it correctly. It is good match for you. If your tennis elbow is caused by work or computer use, take frequent breaks to stretch your arm. Talk with your employer about ways to manage your condition at work. General instructions Take over-the-counter and prescription medicines only as told by your health care provider. Do not use any products that contain nicotine or tobacco. These products include cigarettes, chewing tobacco, and vaping devices, such as e-cigarettes. If you need help quitting, ask your health care provider. Keep all follow-up visits. This is important. How is this prevented? Before and after activity: Warm up and stretch before being active. Cool down and stretch after being active. Give your body time to rest between periods of activity. During activity: Make sure to use equipment that fits you. If you play tennis, put power in your stroke with your lower body. Avoid using your arm only. Maintain physical fitness, including: Strength. Flexibility. Endurance. Do exercises to strengthen the forearm muscles. Contact a health care provider if: You have pain that gets worse or does not get better with treatment. You have numbness or weakness in your forearm, hand, or fingers. Get help right away if: Your pain is severe. You cannot move your wrist. Summary Tennis  elbow (lateral epicondylitis) is inflammation of tendons in your outer forearm, near your elbow. Common symptoms include pain and tenderness in your forearm and the outer part of your elbow. This condition is usually caused by repeatedly extending your wrist, turning your forearm, and using your hands. The first treatment is often resting and icing your arm to relieve symptoms. Further treatment may include taking medicine, getting physical therapy, wearing a brace or strap, or having surgery. This information is not intended to replace advice given to you by your health care provider. Make sure you discuss any questions you have with your health care provider. Document Revised: 03/16/2020 Document Reviewed: 03/16/2020 Elsevier Patient Education  [place].   Peripheral Edema  Peripheral edema is swelling that is caused by a buildup of fluid. Peripheral edema most often affects the lower legs, ankles, and feet. It can also develop in the arms, hands, and face. The area of the body that has peripheral edema will look swollen. It may also feel heavy or warm. Your clothes may start to feel tight. Pressing on the area may  make a temporary dent in your skin (pitting edema). You may not be able to move your swollen arm or leg as much as usual. There are many causes of peripheral edema. It can happen because of a complication of other conditions such as heart failure, kidney disease, or a problem with your circulation. It also can be a side effect of certain medicines or happen because of an infection. It often happens to women during pregnancy. Sometimes, the cause is not known. Follow these instructions at home: Managing pain, stiffness, and swelling  Raise (elevate) your legs while you are sitting or lying down. Move around often to prevent stiffness and to reduce swelling. Do not sit or stand for long periods of time. Do not wear tight clothing. Do not wear garters on your upper  legs. Exercise your legs to get your circulation going. This helps to move the fluid back into your blood vessels, and it may help the swelling go down. Wear compression stockings as told by your health care provider. These stockings help to prevent blood clots and reduce swelling in your legs. It is important that these are the correct size. These stockings should be prescribed by your doctor to prevent possible injuries. If elastic bandages or wraps are recommended, use them as told by your health care provider. Medicines Take over-the-counter and prescription medicines only as told by your health care provider. Your health care provider may prescribe medicine to help your body get rid of excess water (diuretic). Take this medicine if you are told to take it. General instructions Eat a low-salt (low-sodium) diet as told by your health care provider. Sometimes, eating less salt may reduce swelling. Pay attention to any changes in your symptoms. Moisturize your skin daily to help prevent skin from cracking and draining. Keep all follow-up visits. This is important. Contact a health care provider if: You have a fever. You have swelling in only one leg. You have increased swelling, redness, or pain in one or both of your legs. You have drainage or sores at the area where you have edema. Get help right away if: You have edema that starts suddenly or is getting worse, especially if you are pregnant or have a medical condition. You develop shortness of breath, especially when you are lying down. You have pain in your chest or abdomen. You feel weak. You feel like you will faint. These symptoms may be an emergency. Get help right away. Call 911. Do not wait to see if the symptoms will go away. Do not drive yourself to the hospital. Summary Peripheral edema is swelling that is caused by a buildup of fluid. Peripheral edema most often affects the lower legs, ankles, and feet. Move around often to  prevent stiffness and to reduce swelling. Do not sit or stand for long periods of time. Pay attention to any changes in your symptoms. Contact a health care provider if you have edema that starts suddenly or is getting worse, especially if you are pregnant or have a medical condition. Get help right away if you develop shortness of breath, especially when lying down. This information is not intended to replace advice given to you by your health care provider. Make sure you discuss any questions you have with your health care provider. Document Revised: 05/09/2021 Document Reviewed: 05/09/2021 Elsevier Patient Education  [place].  8.

## 2022-10-03 ENCOUNTER — Encounter: Payer: Self-pay | Admitting: Family Medicine

## 2022-10-07 ENCOUNTER — Other Ambulatory Visit: Payer: Self-pay | Admitting: Emergency Medicine

## 2022-10-23 ENCOUNTER — Ambulatory Visit: Payer: 59 | Attending: Internal Medicine | Admitting: Internal Medicine

## 2022-10-23 ENCOUNTER — Encounter: Payer: Self-pay | Admitting: Internal Medicine

## 2022-10-23 VITALS — BP 136/74 | HR 88 | Ht 72.0 in | Wt 228.4 lb

## 2022-10-23 DIAGNOSIS — R079 Chest pain, unspecified: Secondary | ICD-10-CM

## 2022-10-23 NOTE — Progress Notes (Signed)
Cardiology Office Note:    Date:  10/23/2022   ID:  [name], DOB 1951-06-12, MRN [id number]  PCP:  [name], MD   [facility] Cardiologist:  Janina Mayo, MD     Referring MD: [name], MD   No chief complaint on file. CP   History of Present Illness:    [name] is a 72 y.o. male with a hx of HTN, GERD, asthma, COPD, prostate cancer s/p prostatectomy. Noted some CP that was infrequent over the last 3 months with his PCP Dr. Carlota Raspberry. Also reported HA/LH. Noted 1st degree AV block (can't see). ECG from 07/12/2020 shows NSR with septal Q waves.  He notes feeling chest needles. Not associated with activated Lasts 5- 6 minutes, then resolves. .Blood pressure can be too high. Just started  added losartan 25 mg daily. Stopped smoking 24 years ago. Start at age 89 or 24. 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 No orthopnea/PND.  No LE edema. Had prior stress test in the past , it was normal.  Interim Hx 10/23/2022 No symptoms. He is thinking about getting a bike.   Past Medical History:  Diagnosis Date   Allergy    seasonal   Arthritis    oa   Asthma    COPD (chronic obstructive pulmonary disease) ([place])    GERD (gastroesophageal reflux disease)    Hypertension    Prostate cancer ([place]) 01/2016   prostatectomy    Shortness of breath dyspnea     Past Surgical History:  Procedure Laterality Date   HERNIA REPAIR  last done 3-4 yrs ago   x 2   INGUINAL HERNIA REPAIR Right 11/20/2013   Procedure: OPEN REPAIR OF RECURRENT RIGHT INGUINAL HERNIA WITH MESH;  Surgeon: [name]. [name], MD;  Location: [facility];  Service: General;  Laterality: Right;   INSERTION OF MESH Right 11/20/2013   Procedure: INSERTION OF MESH;  Surgeon: [name]. [name], MD;  Location: [facility];  Service: General;  Laterality: Right;   KNEE SURGERY Left july 2014   mcl and meniscus repair   LYMPHADENECTOMY Bilateral 02/09/2016   Procedure: PELVIC LYMPHADENECTOMY;  Surgeon:  [name], MD;  Location: [facility];  Service: Urology;  Laterality: Bilateral;   right knee arthroscopy      ROBOT ASSISTED LAPAROSCOPIC RADICAL PROSTATECTOMY N/A 02/09/2016   Procedure: XI ROBOTIC ASSISTED LAPAROSCOPIC RADICAL PROSTATECTOMY WITH INDOCYANINE GREEN DYE AND ADHESIOLYSIS;  Surgeon: [name], MD;  Location: [facility];  Service: Urology;  Laterality: N/A;   TOTAL KNEE ARTHROPLASTY Right 09/06/2015   Procedure: RIGHT TOTAL KNEE ARTHROPLASTY;  Surgeon: [name], MD;  Location: [facility];  Service: Orthopedics;  Laterality: Right;   TOTAL KNEE ARTHROPLASTY Left 06/17/2019   Procedure: TOTAL KNEE ARTHROPLASTY;  Surgeon: [name], MD;  Location: [facility];  Service: Orthopedics;  Laterality: Left;  70 mins    Current Medications: Current Meds  Medication Sig   albuterol (PROVENTIL HFA) 108 (90 Base) MCG/ACT inhaler Proventil HFA 90 mcg/actuation aerosol inhaler   2 puffs by inhalation route every 4-6 hours as needed.   amLODipine (NORVASC) 5 MG tablet Take 1 tablet (5 mg total) by mouth daily.   ANORO ELLIPTA 62.5-25 MCG/ACT AEPB INHALE 1 PUFF, BY MOUTH, INTO THE LUNGS ONCE A DAY   azelastine (OPTIVAR) 0.05 % ophthalmic solution Place 1 drop into both eyes 2 (two) times daily as needed. For itching/allergy symptoms   Cholecalciferol (VITAMIN D3) 50 MCG (  2000 UT) TABS Take 2,000 Units by mouth daily.   clobetasol cream (TEMOVATE) 0.05 % APPLY TOPICALLY TO AFFECTED AREA(S) NIGHTLY AT BEDTIME   Clobetasol Prop Emollient Base (CLOBETASOL PROPIONATE E) 0.05 % emollient cream Apply 1 application topically at bedtime.   cyclobenzaprine (FLEXERIL) 5 MG tablet take one tablet by mouth three times daily as needed for muscle spasm. start at bedtime due to sedation   esomeprazole (KLS ESOMEPRAZOLE MAGNESIUM) 20 MG capsule Take 1 capsule (20 mg total) by mouth daily.   fluconazole (DIFLUCAN) 150 MG tablet Take 1 tablet (150 mg total) by mouth daily.   KLS ALLER-FLO 50 MCG/ACT nasal spray  PLACE TWO SPRAYS INTO BOTH NOSTRILS ONCE A DAY   losartan (COZAAR) 50 MG tablet Take 1 tablet (50 mg total) by mouth daily.   meloxicam (MOBIC) 15 MG tablet Take 1 tablet (15 mg total) by mouth daily as needed for pain.   Menthol-Methyl Salicylate (SALONPAS PAIN RELIEF PATCH EX) Place 1 patch onto the skin daily as needed (pain.).   methocarbamol (ROBAXIN) 500 MG tablet Take 500 mg by mouth 3 (three) times daily.   pravastatin (PRAVACHOL) 20 MG tablet Take 1 tablet (20 mg total) by mouth daily.   Tavaborole (KERYDIN) 5 % SOLN Apply 1 drop topically daily. Apply 1 drop to the toenail daily.   triamcinolone cream (KENALOG) 0.1 % Apply 1 application topically 2 (two) times daily as needed. To affected areas only as needed.   Current Facility-Administered Medications for the 10/23/22 encounter (Office Visit) with Janina Mayo, MD  Medication   triamcinolone acetonide (KENALOG) 10 MG/ML injection 10 mg     Allergies:   Percocet [oxycodone-acetaminophen] and Amoxicillin   Social History   Socioeconomic History   Marital status: Married    Spouse name: Not on file   Number of children: Not on file   Years of education: Not on file   Highest education level: Not on file  Occupational History   Not on file  Tobacco Use   Smoking status: Former    Packs/day: 1.50    Years: 20.00    Total pack years: 30.00    Types: Cigarettes    Quit date: 11/05/1999    Years since quitting: 22.9   Smokeless tobacco: Never  Vaping Use   Vaping Use: Never used  Substance and Sexual Activity   Alcohol use: No    Comment: last used 25 years ago    Drug use: Yes    Types: Cocaine    Comment: last used cocaine  and acid 25 yrs ago,none used since   Sexual activity: Not on file  Other Topics Concern   Not on file  Social History Narrative   Not on file   Social Determinants of Health   Financial Resource Strain: Not on file  Food Insecurity: Not on file  Transportation Needs: Not on file   Physical Activity: Not on file  Stress: Not on file  Social Connections: Not on file     Family History: The patient's family history includes Arthritis in his sister; Cancer in his father, maternal grandmother, mother, and sister; Heart disease in his maternal grandfather; Stroke in his maternal grandfather.  ROS:   Please see the history of present illness.     All other systems reviewed and are negative.  EKGs/Labs/Other Studies Reviewed:    The following studies were reviewed today:   EKG:  EKG is  ordered today.  The ekg ordered today demonstrates  08/07/2022- NSR, septal Q, LAFB, 1st degree AV block  Recent Labs: 07/24/2022: ALT 31 08/03/2022: BUN 15; Creatinine, Ser 0.82; Hemoglobin 16.2; Platelets 269.0; Potassium 4.2; Sodium 139   Recent Lipid Panel    Component Value Date/Time   CHOL 144 07/24/2022 1446   CHOL 155 07/12/2020 1210   TRIG 99.0 07/24/2022 1446   HDL 38.70 (L) 07/24/2022 1446   HDL 39 (L) 07/12/2020 1210   CHOLHDL 4 07/24/2022 1446   VLDL 19.8 07/24/2022 1446   LDLCALC 85 07/24/2022 1446   LDLCALC 101 (H) 07/12/2020 1210     Risk Assessment/Calculations:     Physical Exam:    VS:   Vitals:   10/23/22 0956  BP: 136/74  Pulse: 88  SpO2: 94%     Wt Readings from Last 3 Encounters:  10/23/22 228 lb 6.4 oz (103.6 kg)  10/02/22 227 lb 12.8 oz (103.3 kg)  09/13/22 226 lb 12.8 oz (102.9 kg)     GEN:  Well nourished, well developed in no acute distress HEENT: Normal NECK: No JVD; No carotid bruits LYMPHATICS: No lymphadenopathy CARDIAC: RRR, no murmurs, rubs, gallops RESPIRATORY:  Clear to auscultation without rales, wheezing or rhonchi  ABDOMEN: Soft, non-tender, non-distended MUSCULOSKELETAL:  No edema; No deformity  SKIN: Warm and dry NEUROLOGIC:  Alert and oriented x 3 PSYCHIATRIC:  Normal affect   ASSESSMENT:    Chest Pain: atypical signs, however he has higher CVD risk. CAC is zero. Low suspicion for cardiac; however  distal LAD was not well visualized  HLD: continue pravastatin 20 mg daily. LDL 85 mg/dL . Can continue since distal LAD was not visualized  HTN: well controlled. just started losartan 25 mg daily, increased to losartan 50 mg daily. Continue norvasc 5 mg daily (had swelling in the ankles with 10 mg). BP goal < 130/80 mmHg    PLAN:    In order of problems listed above:  Follow up as needed           Medication Adjustments/Labs and Tests Ordered: Current medicines are reviewed at length with the patient today.  Concerns regarding medicines are outlined above.  No orders of the defined types were placed in this encounter.  No orders of the defined types were placed in this encounter.   There are no Patient Instructions on file for this visit.   Signed, Janina Mayo, MD  10/23/2022 9:59 AM    [place]

## 2022-10-23 NOTE — Patient Instructions (Signed)
Medication Instructions:  No Changes In Medications at this time.  *If you need a refill on your cardiac medications before your next appointment, please call your pharmacy*  Lab Work: None Ordered At This Time.  If you have labs (blood work) drawn today and your tests are completely normal, you will receive your results only by: [facility] (if you have MyChart) OR A paper copy in the mail If you have any lab test that is abnormal or we need to change your treatment, we will call you to review the results.  Testing/Procedures: None Ordered At This Time.   Follow-Up: At [facility], you and your health needs are our priority.  As part of our continuing mission to provide you with exceptional heart care, we have created designated Provider Care Teams.  These Care Teams include your primary Cardiologist (physician) and Advanced Practice Providers (APPs -  Physician Assistants and Nurse Practitioners) who all work together to provide you with the care you need, when you need it.  Your next appointment:   AS NEEDED   Provider:   Janina Mayo, MD

## 2022-10-27 ENCOUNTER — Ambulatory Visit: Payer: 59 | Admitting: Emergency Medicine

## 2022-10-30 ENCOUNTER — Telehealth: Payer: Self-pay | Admitting: *Deleted

## 2022-10-30 ENCOUNTER — Encounter: Payer: Self-pay | Admitting: Podiatry

## 2022-10-30 NOTE — Telephone Encounter (Signed)
Patient is calling to request something for pain until his upcoming appointment,meloxicam is not helping, he has taken some of his wife's tylenol and helped a little,sooner appointment/ please advise.

## 2022-10-31 ENCOUNTER — Other Ambulatory Visit: Payer: Self-pay | Admitting: Podiatry

## 2022-10-31 MED ORDER — UREA 40 % EX CREA: 1.0000 | TOPICAL_CREAM | Freq: Every day | CUTANEOUS | 0 refills | Status: AC

## 2022-10-31 MED ORDER — METHYLPREDNISOLONE 4 MG PO TBPK: ORAL_TABLET | ORAL | 0 refills | Status: DC

## 2022-11-06 ENCOUNTER — Ambulatory Visit: Payer: 59 | Admitting: Family Medicine

## 2022-11-06 ENCOUNTER — Encounter: Payer: Self-pay | Admitting: Family Medicine

## 2022-11-06 VITALS — BP 130/78 | HR 89 | Temp 98.8°F | Ht 72.0 in | Wt 223.0 lb

## 2022-11-06 DIAGNOSIS — I10 Essential (primary) hypertension: Secondary | ICD-10-CM | POA: Diagnosis not present

## 2022-11-06 DIAGNOSIS — R600 Localized edema: Secondary | ICD-10-CM

## 2022-11-06 NOTE — Patient Instructions (Signed)
Numbers look good. No changes for now. Keep up the good work with avoiding salt. Take care.

## 2022-11-06 NOTE — Progress Notes (Signed)
Subjective:  Patient ID: [name], male    DOB: 14-May-1951  Age: 72 y.o. MRN: [id number]  CC:  Chief Complaint  Patient presents with   Edema    Pt reports better but no resolved    Hypertension    No physical sxs, brought monitor for comparison 147/88 this am     HPI [name] presents for   Hypertension: With pedal edema. He was seen by cardiology February 5. Note reviewed.  Hypertension well-controlled at time, had just started losartan 25 mg daily, increase to 50 mg daily and continued on amlodipine 5 mg daily as more pedal edema with 10 mg.  Blood pressure goal less than 130/80.  Continued on pravastatin since distal LAD was not visualized, but coronary calcium scoring was 0, low suspicion for cardiac source of previous chest symptoms. Swelling better on lower dose amlodipine.  Home monitor 147/88 at home. Home meter here today - 130/80 vs 130/78 on in office monitor - similar to in office reading.  No med side effects. Cutting back on salt.  Has appt with me 3/25.   BP Readings from Last 3 Encounters:  11/06/22 130/78  10/23/22 136/74  10/02/22 134/76   Lab Results  Component Value Date   CREATININE 0.82 08/03/2022     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   Pain in left knee 06/02/2019   Allergic rhinitis 03/12/2017   Thrush, oral 02/27/2017   SUI (stress urinary incontinence), male 03/20/2016   Prostate cancer ([place]) 02/09/2016   Obese 09/08/2015   S/P right TKA 09/06/2015   S/P knee replacement 09/06/2015   COPD, 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   Pulmonary nodules 02/23/2014   Tobacco use disorder, moderate, in sustained remission 02/23/2014   Recurrent right inguinal hernia 10/20/2013   ED (erectile dysfunction) 08/26/2012   Physical exam, annual 11/05/2011   HTN (hypertension) 11/05/2011   GERD  (gastroesophageal reflux disease) 11/05/2011   Chronic joint pain 11/05/2011   Past Medical History:  Diagnosis Date   Allergy    seasonal   Arthritis    oa   Asthma    COPD (chronic obstructive pulmonary disease) ([place])    GERD (gastroesophageal reflux disease)    Hypertension    Prostate cancer ([place]) 01/2016   prostatectomy    Shortness of breath dyspnea    Past Surgical History:  Procedure Laterality Date   HERNIA REPAIR  last done 3-4 yrs ago   x 2   INGUINAL HERNIA REPAIR Right 11/20/2013   Procedure: OPEN REPAIR OF RECURRENT RIGHT INGUINAL HERNIA WITH MESH;  Surgeon: [name]. [name], MD;  Location: [facility];  Service: General;  Laterality: Right;   INSERTION OF MESH Right 11/20/2013   Procedure: INSERTION OF MESH;  Surgeon: [name]. [name], MD;  Location: [facility];  Service: General;  Laterality: Right;   KNEE SURGERY Left july 2014   mcl and meniscus repair   LYMPHADENECTOMY Bilateral 02/09/2016   Procedure: PELVIC LYMPHADENECTOMY;  Surgeon: [name], MD;  Location: [facility];  Service: Urology;  Laterality: Bilateral;   right knee arthroscopy      ROBOT ASSISTED LAPAROSCOPIC RADICAL PROSTATECTOMY N/A 02/09/2016   Procedure: XI ROBOTIC ASSISTED LAPAROSCOPIC RADICAL PROSTATECTOMY WITH INDOCYANINE GREEN DYE AND ADHESIOLYSIS;  Surgeon: [name], MD;  Location: [facility];  Service: Urology;  Laterality: N/A;   TOTAL  KNEE ARTHROPLASTY Right 09/06/2015   Procedure: RIGHT TOTAL KNEE ARTHROPLASTY;  Surgeon: [name], MD;  Location: [facility];  Service: Orthopedics;  Laterality: Right;   TOTAL KNEE ARTHROPLASTY Left 06/17/2019   Procedure: TOTAL KNEE ARTHROPLASTY;  Surgeon: [name], MD;  Location: [facility];  Service: Orthopedics;  Laterality: Left;  70 mins   Allergies  Allergen Reactions   Percocet [Oxycodone-Acetaminophen] Other (See Comments)    Nausea-Doesn't work   Amoxicillin Rash    Has patient had a PCN reaction causing immediate rash, facial/tongue/throat swelling,  SOB or lightheadedness with hypotension: No Has patient had a PCN reaction causing severe rash involving mucus membranes or skin necrosis: No Has patient had a PCN reaction that required hospitalization No Has patient had a PCN reaction occurring within the last 10 years: Yes- 2016 If all of the above answers are "NO", then may proceed with Cephalosporin use.    Prior to Admission medications   Medication Sig Start Date End Date Taking? Authorizing Provider  albuterol (PROVENTIL HFA) 108 (90 Base) MCG/ACT inhaler Proventil HFA 90 mcg/actuation aerosol inhaler   2 puffs by inhalation route every 4-6 hours as needed. 09/15/20  Yes Collene Gobble, MD  amLODipine (NORVASC) 5 MG tablet Take 1 tablet (5 mg total) by mouth daily. 10/02/22  Yes [name], MD  ANORO ELLIPTA 62.5-25 MCG/ACT AEPB INHALE 1 PUFF, BY MOUTH, INTO THE LUNGS ONCE A DAY 10/10/22  Yes Byrum, Rose Fillers, MD  azelastine (OPTIVAR) 0.05 % ophthalmic solution Place 1 drop into both eyes 2 (two) times daily as needed. For itching/allergy symptoms 07/14/19  Yes [name], MD  Cholecalciferol (VITAMIN D3) 50 MCG (2000 UT) TABS Take 2,000 Units by mouth daily.   Yes [provider]  clobetasol cream (TEMOVATE) 0.05 % APPLY TOPICALLY TO AFFECTED AREA(S) NIGHTLY AT BEDTIME 08/30/20  Yes Lavonna Monarch, MD  Clobetasol Prop Emollient Base (CLOBETASOL PROPIONATE E) 0.05 % emollient cream Apply 1 application topically at bedtime. 08/22/21  Yes Lavonna Monarch, MD  cyclobenzaprine (FLEXERIL) 5 MG tablet take one tablet by mouth three times daily as needed for muscle spasm. start at bedtime due to sedation 01/12/20  Yes [name], MD  esomeprazole (KLS ESOMEPRAZOLE MAGNESIUM) 20 MG capsule Take 1 capsule (20 mg total) by mouth daily. 07/24/22  Yes [name], MD  fluconazole (DIFLUCAN) 150 MG tablet Take 1 tablet (150 mg total) by mouth daily. 09/13/22  Yes [name], MD  KLS ALLER-FLO 50 MCG/ACT nasal spray  PLACE TWO SPRAYS INTO BOTH NOSTRILS ONCE A DAY 06/12/22  Yes [name], MD  losartan (COZAAR) 50 MG tablet Take 1 tablet (50 mg total) by mouth daily. 09/13/22  Yes [name], MD  meloxicam (MOBIC) 15 MG tablet Take 1 tablet (15 mg total) by mouth daily as needed for pain. 09/04/22 09/04/23 Yes Trula Slade, DPM  Menthol-Methyl Salicylate (SALONPAS PAIN RELIEF PATCH EX) Place 1 patch onto the skin daily as needed (pain.).   Yes [provider]  methocarbamol (ROBAXIN) 500 MG tablet Take 500 mg by mouth 3 (three) times daily. 08/02/21  Yes [provider]  pravastatin (PRAVACHOL) 20 MG tablet Take 1 tablet (20 mg total) by mouth daily. 07/24/22  Yes [name], MD  Tavaborole (KERYDIN) 5 % SOLN Apply 1 drop topically daily. Apply 1 drop to the toenail daily. 06/15/22  Yes Trula Slade, DPM  triamcinolone cream (KENALOG) 0.1 % Apply 1 application topically 2 (two)  times daily as needed. To affected areas only as needed. 01/08/19  Yes [name], MD  urea (CARMOL) 40 % CREA Apply 1 Application topically daily. 10/31/22  Yes Trula Slade, DPM  methylPREDNISolone (MEDROL DOSEPAK) 4 MG TBPK tablet Take as directed Patient not taking: Reported on 11/06/2022 10/31/22   Trula Slade, DPM  metoprolol tartrate (LOPRESSOR) 50 MG tablet Take 1 tablet (50 mg total) by mouth once for 1 dose. PLEASE TAKE METOPROLOL 2  HOURS PRIOR TO CTA SCAN. 08/07/22 08/07/22  Janina Mayo, MD   Social History   Socioeconomic History   Marital status: Married    Spouse name: Not on file   Number of children: Not on file   Years of education: Not on file   Highest education level: Not on file  Occupational History   Not on file  Tobacco Use   Smoking status: Former    Packs/day: 1.50    Years: 20.00    Total pack years: 30.00    Types: Cigarettes    Quit date: 11/05/1999    Years since quitting: 23.0   Smokeless tobacco: Never  Vaping Use   Vaping  Use: Never used  Substance and Sexual Activity   Alcohol use: No    Comment: last used 25 years ago    Drug use: Yes    Types: Cocaine    Comment: last used cocaine  and acid 25 yrs ago,none used since   Sexual activity: Not on file  Other Topics Concern   Not on file  Social History Narrative   Not on file   Social Determinants of Health   Financial Resource Strain: Not on file  Food Insecurity: Not on file  Transportation Needs: Not on file  Physical Activity: Not on file  Stress: Not on file  Social Connections: Not on file  Intimate Partner Violence: Not on file    Review of Systems  Constitutional:  Negative for fatigue and unexpected weight change.  Eyes:  Negative for visual disturbance.  Respiratory:  Negative for cough, chest tightness and shortness of breath.   Cardiovascular:  Negative for chest pain, palpitations and leg swelling.  Gastrointestinal:  Negative for abdominal pain and blood in stool.  Neurological:  Negative for dizziness, light-headedness and headaches.     Objective:   Vitals:   11/06/22 1100  BP: 130/78  Pulse: 89  Temp: 98.8 F (37.1 C)  TempSrc: Temporal  SpO2: 96%  Weight: 223 lb (101.2 kg)  Height: 6' (1.829 m)     Physical Exam Vitals reviewed.  Constitutional:      Appearance: He is well-developed.  HENT:     Head: Normocephalic and atraumatic.  Neck:     Vascular: No carotid bruit or JVD.  Cardiovascular:     Rate and Rhythm: Normal rate and regular rhythm.     Heart sounds: Normal heart sounds. No murmur heard. Pulmonary:     Effort: Pulmonary effort is normal.     Breath sounds: Normal breath sounds. No rales.  Musculoskeletal:     Right lower leg: No edema.     Left lower leg: No edema.  Skin:    General: Skin is warm and dry.  Neurological:     Mental Status: He is alert and oriented to person, place, and time.  Psychiatric:        Mood and Affect: Mood normal.        Assessment & Plan:  [name]  is a 72 y.o. male . Pedal edema  -Improved/resolved, tolerating lower dose of amlodipine.  Also commended on avoidance of sodium.  Follow-up next month as planned and can check labs again at that time.  Essential hypertension  -Improved control, similar readings as home monitor.  Continue same regimen for now, plan on recheck labs at his next appointment.  Cardiology note reviewed as above, reassuring coronary calcium scoring but without seeing distal LAD will continue statin.  RTC precautions given.  Denies chest pains currently.  No orders of the defined types were placed in this encounter.  Patient Instructions  Numbers look good. No changes for now. Keep up the good work with avoiding salt. Take care.     Signed,   [name], MD [facility] 11/06/22 11:27 AM

## 2022-11-10 ENCOUNTER — Telehealth: Payer: Self-pay | Admitting: *Deleted

## 2022-11-10 NOTE — Telephone Encounter (Signed)
[name] w/ [employer] [id number] calling for clarification on employer work restrictions for pushing and pulling ability sent on 09/19/22,sent a consent form signed by patient concerning this, can take a verbal ,will be re faxing the form as well.Please advise.

## 2022-11-13 ENCOUNTER — Encounter: Payer: Self-pay | Admitting: Podiatry

## 2022-11-15 ENCOUNTER — Encounter: Payer: Self-pay | Admitting: Podiatry

## 2022-11-20 ENCOUNTER — Ambulatory Visit: Payer: 59 | Admitting: Podiatry

## 2022-11-20 ENCOUNTER — Ambulatory Visit (INDEPENDENT_AMBULATORY_CARE_PROVIDER_SITE_OTHER): Payer: 59

## 2022-11-20 DIAGNOSIS — G8929 Other chronic pain: Secondary | ICD-10-CM

## 2022-11-20 DIAGNOSIS — M79671 Pain in right foot: Secondary | ICD-10-CM | POA: Diagnosis not present

## 2022-11-20 DIAGNOSIS — M2142 Flat foot [pes planus] (acquired), left foot: Secondary | ICD-10-CM

## 2022-11-20 DIAGNOSIS — L84 Corns and callosities: Secondary | ICD-10-CM | POA: Diagnosis not present

## 2022-11-20 DIAGNOSIS — M2141 Flat foot [pes planus] (acquired), right foot: Secondary | ICD-10-CM | POA: Diagnosis not present

## 2022-11-21 ENCOUNTER — Telehealth: Payer: Self-pay | Admitting: Emergency Medicine

## 2022-11-21 NOTE — Telephone Encounter (Signed)
Pt states he is needing a refill ANORO ELLIPTA 62.5-25 MCG/ACT AEPB [id number] . Pt is all out  Pharmacy: [facility]

## 2022-11-22 ENCOUNTER — Other Ambulatory Visit: Payer: Self-pay | Admitting: Emergency Medicine

## 2022-11-22 ENCOUNTER — Encounter: Payer: Self-pay | Admitting: Podiatry

## 2022-11-22 MED ORDER — ANORO ELLIPTA 62.5-25 MCG/ACT IN AEPB: INHALATION_SPRAY | RESPIRATORY_TRACT | 3 refills | Status: DC

## 2022-11-22 NOTE — Telephone Encounter (Signed)
ATC X1 LVM for patient to call the office back. If patient calls back please advise Anoro has been sent to pharmacy

## 2022-11-22 NOTE — Progress Notes (Signed)
Subjective: Chief Complaint  Patient presents with   Foot Pain    Right foot, heel pain, callouse, located at the ball of foot and bottom of great hallux      79 72-year-old male presents the office with concerns of left heel pain as well as callus on the right foot along the sesamoid.  He still started using the urea cream.  He did finish course of steroids which helped the heel pain.  Calluses were causing most of his discomfort this time.  He has not seen any open sores and no swelling redness or any drainage.  No recent injury or changes otherwise.  Objective: AAO x3, NAD DP/PT pulses palpable bilaterally, CRT less than 3 seconds There is improved and only slight tenderness to palpation along the plantar medial tubercle of the calcaneus at the insertion of plantar fascia on the right foot. There is no pain along the course of the plantar fascia within the arch of the foot. Plantar fascia appears to be intact. There is no pain with lateral compression of the calcaneus or pain with vibratory sensation. Hyperkeratotic lesion noted right foot submetatarsal without any underlying ulceration drainage or signs of infection but this appears to be along the area of the sesamoid. Hyperkeratotic lesion noted the distal aspect left third toe due to hammertoe deformity.  There is no underlying ulceration drainage or signs of infection. No pain with calf compression, swelling, warmth, erythema  Assessment: 72 year old male with plantar fasciitis, hyperkeratotic calluses  Plan: -All treatment options discussed with the patient including all alternatives, risks, complications.  -X-rays were obtained of the right foot.  3 views were obtained.  Calcaneal spurs present.  Flatfoot is present.  Previous first major arthrodesis which appears to have healed and hardware intact. -Oral steroids seem to help the heel pain.  Discussed stretching, icing as well as continue his shoes and good arch supports on a  regular basis. -Sharply debrided the calluses and complications of bleeding.  I added a further pad to the orthotics to help offload.  Continue urea for now.  Monitor any signs or symptoms of infection or skin breakdown.  No follow-ups on file.  Trula Slade DPM

## 2022-12-05 ENCOUNTER — Other Ambulatory Visit: Payer: Self-pay | Admitting: Family Medicine

## 2022-12-05 DIAGNOSIS — J309 Allergic rhinitis, unspecified: Secondary | ICD-10-CM

## 2022-12-11 ENCOUNTER — Ambulatory Visit: Payer: 59 | Admitting: Family Medicine

## 2022-12-11 ENCOUNTER — Encounter: Payer: Self-pay | Admitting: Family Medicine

## 2022-12-11 ENCOUNTER — Telehealth: Payer: Self-pay | Admitting: Family Medicine

## 2022-12-11 VITALS — BP 122/68 | HR 78 | Temp 98.7°F | Ht 72.0 in | Wt 222.6 lb

## 2022-12-11 DIAGNOSIS — B370 Candidal stomatitis: Secondary | ICD-10-CM

## 2022-12-11 DIAGNOSIS — I10 Essential (primary) hypertension: Secondary | ICD-10-CM

## 2022-12-11 DIAGNOSIS — E785 Hyperlipidemia, unspecified: Secondary | ICD-10-CM

## 2022-12-11 LAB — COMPREHENSIVE METABOLIC PANEL
ALT: 31 U/L (ref 0–53)
AST: 19 U/L (ref 0–37)
Albumin: 4.5 g/dL (ref 3.5–5.2)
Alkaline Phosphatase: 86 U/L (ref 39–117)
BUN: 12 mg/dL (ref 6–23)
CO2: 30 mEq/L (ref 19–32)
Calcium: 9.5 mg/dL (ref 8.4–10.5)
Chloride: 102 mEq/L (ref 96–112)
Creatinine, Ser: 0.85 mg/dL (ref 0.40–1.50)
GFR: 87.58 mL/min (ref >60.00)
Glucose, Bld: 100 mg/dL — ABNORMAL HIGH (ref 70–99)
Potassium: 4.2 mEq/L (ref 3.5–5.1)
Sodium: 139 mEq/L (ref 135–145)
Total Bilirubin: 0.5 mg/dL (ref 0.2–1.2)
Total Protein: 6.9 g/dL (ref 6.0–8.3)

## 2022-12-11 LAB — LIPID PANEL
Cholesterol: 132 mg/dL (ref 0–200)
HDL: 35.5 mg/dL — ABNORMAL LOW (ref >39.00)
LDL Cholesterol: 71 mg/dL (ref 0–99)
NonHDL: 96.67
Total CHOL/HDL Ratio: 4
Triglycerides: 129 mg/dL (ref 0.0–149.0)
VLDL: 25.8 mg/dL (ref 0.0–40.0)

## 2022-12-11 MED ORDER — FLUCONAZOLE 150 MG PO TABS: 150.0000 mg | ORAL_TABLET | Freq: Every day | ORAL | 0 refills | Status: DC

## 2022-12-11 NOTE — Patient Instructions (Signed)
Blood pressure looks good today.  No med changes.  I do think you might have an early case of thrush, start Diflucan 2 pills today, then 1 pill/day for 1 week.  Follow-up if that white coating does not improve or any worsening symptoms.  Keep follow-up in May as planned.  Take care!

## 2022-12-11 NOTE — Telephone Encounter (Signed)
Caller name:Amani from [employer]   On DPR?: no  Call back number: [phone] Provider they see: [name], MD  Reason for call: Amani from [employer] called to verify direction for this patient medication fluconazole (DIFLUCAN) 150 MG tablet.

## 2022-12-11 NOTE — Progress Notes (Signed)
Subjective:  Patient ID: [name], male    DOB: 06/07/1951  Age: 72 y.o. MRN: [id number]  CC:  Chief Complaint  Patient presents with   Hypertension    HPI [name] presents for   Hypertension: With some issues with pedal edema previously.  Has been seen by cardiology.  Improved control at his February 19 visit on losartan 50 mg daily, amlodipine 5 mg daily (increased edema at 10 mg).  Had been cutting back on salt.  Edema is also improving with sodium avoidance.  No new swelling or med side effects. No recent swelling Home readings: 131/72 BP Readings from Last 3 Encounters:  12/11/22 122/68  11/06/22 130/78  10/23/22 136/74   Lab Results  Component Value Date   CREATININE 0.82 08/03/2022    Hyperlipidemia: Pravastatin 20mg  qd. Last ate at 10pm last night.  No new side effects.  Lab Results  Component Value Date   CHOL 144 07/24/2022   HDL 38.70 (L) 07/24/2022   LDLCALC 85 07/24/2022   TRIG 99.0 07/24/2022   CHOLHDL 4 07/24/2022   Lab Results  Component Value Date   ALT 31 07/24/2022   AST 19 07/24/2022   ALKPHOS 85 07/24/2022   BILITOT 0.5 07/24/2022    Tongue pain: Stinging past few days. Uses inhaler, rinses mouth afterwards. Not feeling like prior thrush.    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   Pulmonary nodules 02/23/2014   Tobacco use disorder, moderate, in sustained remission 02/23/2014   Recurrent right inguinal hernia 10/20/2013   ED (erectile dysfunction) 08/26/2012   Physical exam, annual 11/05/2011    HTN (hypertension) 11/05/2011   GERD (gastroesophageal reflux disease) 11/05/2011   Chronic joint pain 11/05/2011   Past Medical History:  Diagnosis Date   Allergy    seasonal   Arthritis    oa   Asthma    COPD (chronic obstructive pulmonary disease) ([place])    GERD (gastroesophageal reflux disease)    Hypertension    Prostate cancer ([place]) 01/2016   prostatectomy    Shortness of breath dyspnea    Past Surgical History:  Procedure Laterality Date   HERNIA REPAIR  last done 3-4 yrs ago   x 2   INGUINAL HERNIA REPAIR Right 11/20/2013   Procedure: OPEN REPAIR OF RECURRENT RIGHT INGUINAL HERNIA WITH MESH;  Surgeon: [name]. [name], MD;  Location: [facility];  Service: General;  Laterality: Right;   INSERTION OF MESH Right 11/20/2013   Procedure: INSERTION OF MESH;  Surgeon: [name]. [name], MD;  Location: [facility];  Service: General;  Laterality: Right;   KNEE SURGERY Left july 2014   mcl and meniscus repair   LYMPHADENECTOMY Bilateral 02/09/2016   Procedure: PELVIC LYMPHADENECTOMY;  Surgeon: [name], MD;  Location: [facility];  Service: Urology;  Laterality: Bilateral;   right knee arthroscopy      ROBOT ASSISTED LAPAROSCOPIC RADICAL PROSTATECTOMY N/A 02/09/2016   Procedure: XI ROBOTIC ASSISTED LAPAROSCOPIC RADICAL PROSTATECTOMY WITH INDOCYANINE GREEN DYE AND ADHESIOLYSIS;  Surgeon: [name], MD;  Location: [facility];  Service: Urology;  Laterality: N/A;   TOTAL KNEE ARTHROPLASTY Right 09/06/2015   Procedure: RIGHT TOTAL KNEE ARTHROPLASTY;  Surgeon: [name], MD;  Location: [facility];  Service: Orthopedics;  Laterality: Right;   TOTAL KNEE ARTHROPLASTY Left 06/17/2019   Procedure: TOTAL KNEE ARTHROPLASTY;  Surgeon: [name], MD;  Location: [facility];  Service: Orthopedics;  Laterality: Left;  70 mins   Allergies  Allergen Reactions   Percocet [Oxycodone-Acetaminophen] Other (See Comments)    Nausea-Doesn't work   Amoxicillin Rash    Has patient had a PCN reaction causing  immediate rash, facial/tongue/throat swelling, SOB or lightheadedness with hypotension: No Has patient had a PCN reaction causing severe rash involving mucus membranes or skin necrosis: No Has patient had a PCN reaction that required hospitalization No Has patient had a PCN reaction occurring within the last 10 years: Yes- 2016 If all of the above answers are "NO", then may proceed with Cephalosporin use.    Prior to Admission medications   Medication Sig Start Date End Date Taking? Authorizing Provider  albuterol (PROVENTIL HFA) 108 (90 Base) MCG/ACT inhaler Proventil HFA 90 mcg/actuation aerosol inhaler   2 puffs by inhalation route every 4-6 hours as needed. 09/15/20  Yes Collene Gobble, MD  amLODipine (NORVASC) 5 MG tablet Take 1 tablet (5 mg total) by mouth daily. 10/02/22  Yes [name], MD  azelastine (OPTIVAR) 0.05 % ophthalmic solution Place 1 drop into both eyes 2 (two) times daily as needed. For itching/allergy symptoms 07/14/19  Yes [name], MD  Cholecalciferol (VITAMIN D3) 50 MCG (2000 UT) TABS Take 2,000 Units by mouth daily.   Yes [provider]  clobetasol cream (TEMOVATE) 0.05 % APPLY TOPICALLY TO AFFECTED AREA(S) NIGHTLY AT BEDTIME 08/30/20  Yes Lavonna Monarch, MD  Clobetasol Prop Emollient Base (CLOBETASOL PROPIONATE E) 0.05 % emollient cream Apply 1 application topically at bedtime. 08/22/21  Yes Lavonna Monarch, MD  cyclobenzaprine (FLEXERIL) 5 MG tablet take one tablet by mouth three times daily as needed for muscle spasm. start at bedtime due to sedation 01/12/20  Yes [name], MD  esomeprazole (KLS ESOMEPRAZOLE MAGNESIUM) 20 MG capsule Take 1 capsule (20 mg total) by mouth daily. 07/24/22  Yes [name], MD  fluconazole (DIFLUCAN) 150 MG tablet Take 1 tablet (150 mg total) by mouth daily. 09/13/22  Yes [name], MD  KLS ALLER-FLO 50 MCG/ACT nasal spray INSTILL TWO SPRAYS INTO BOTH NOSTRILS ONCE DAILY 12/05/22  Yes [name], MD  losartan (COZAAR) 50 MG tablet Take 1 tablet (50 mg total) by mouth daily. 09/13/22  Yes [name], MD  meloxicam (MOBIC) 15 MG tablet Take 1 tablet (15 mg total) by mouth daily as needed for pain. 09/04/22 09/04/23 Yes Trula Slade, DPM  Menthol-Methyl Salicylate (SALONPAS PAIN RELIEF PATCH EX) Place 1 patch onto the skin daily as needed (pain.).   Yes [provider]  methocarbamol (ROBAXIN) 500 MG tablet Take 500 mg by mouth 3 (three) times daily. 08/02/21  Yes [provider]  methylPREDNISolone (MEDROL DOSEPAK) 4 MG TBPK tablet Take as directed 10/31/22  Yes Trula Slade, DPM  pravastatin (PRAVACHOL) 20 MG tablet Take 1 tablet (20 mg total) by mouth daily. 07/24/22  Yes [name], MD  Tavaborole (KERYDIN) 5 % SOLN Apply 1 drop topically daily. Apply 1 drop to the toenail daily. 06/15/22  Yes Trula Slade, DPM  triamcinolone cream (KENALOG) 0.1 % Apply  1 application topically 2 (two) times daily as needed. To affected areas only as needed. 01/08/19  Yes [name], MD  umeclidinium-vilanterol [facility] ELLIPTA) 62.5-25 MCG/ACT AEPB INHALE 1 PUFF, BY MOUTH, INTO THE LUNGS ONCE A DAY 11/22/22  Yes Byrum, Rose Fillers, MD  urea (CARMOL) 40 % CREA Apply 1 Application topically daily. 10/31/22  Yes Trula Slade, DPM  metoprolol tartrate (LOPRESSOR) 50 MG tablet Take 1 tablet (50 mg total) by mouth once for 1 dose. PLEASE TAKE METOPROLOL 2  HOURS PRIOR TO CTA SCAN. 08/07/22 08/07/22  Janina Mayo, MD   Social History   Socioeconomic History   Marital status: Married    Spouse name: Not on file   Number of children: Not on file   Years of education: Not on file   Highest education level: Not on file  Occupational History   Not on file  Tobacco Use   Smoking status: Former    Packs/day: 1.50    Years: 20.00    Additional pack years: 0.00    Total pack years: 30.00    Types: Cigarettes    Quit date: 11/05/1999    Years since  quitting: 23.1   Smokeless tobacco: Never  Vaping Use   Vaping Use: Never used  Substance and Sexual Activity   Alcohol use: No    Comment: last used 25 years ago    Drug use: Yes    Types: Cocaine    Comment: last used cocaine  and acid 25 yrs ago,none used since   Sexual activity: Not on file  Other Topics Concern   Not on file  Social History Narrative   Not on file   Social Determinants of Health   Financial Resource Strain: Not on file  Food Insecurity: Not on file  Transportation Needs: Not on file  Physical Activity: Not on file  Stress: Not on file  Social Connections: Not on file  Intimate Partner Violence: Not on file    Review of Systems  Constitutional:  Negative for fatigue and unexpected weight change.  Eyes:  Negative for visual disturbance.  Respiratory:  Negative for cough, chest tightness and shortness of breath.   Cardiovascular:  Negative for chest pain, palpitations and leg swelling.  Gastrointestinal:  Negative for abdominal pain and blood in stool.  Neurological:  Negative for dizziness, light-headedness and headaches.     Objective:   Vitals:   12/11/22 1019  BP: 122/68  Pulse: 78  Temp: 98.7 F (37.1 C)  TempSrc: Temporal  SpO2: 99%  Weight: 222 lb 9.6 oz (101 kg)  Height: 6' (1.829 m)     Physical Exam Vitals reviewed.  Constitutional:      Appearance: He is well-developed.  HENT:     Head: Normocephalic and atraumatic.     Mouth/Throat:     Mouth: Mucous membranes are moist.     Comments: White coating of tongue, sparing of posterior oropharynx or buccal mucosa. Neck:     Vascular: No carotid bruit or JVD.  Cardiovascular:     Rate and Rhythm: Normal rate and regular rhythm.     Heart sounds: Normal heart sounds. No murmur heard. Pulmonary:     Effort: Pulmonary effort is normal.     Breath sounds: Normal breath sounds. No rales.  Musculoskeletal:     Right lower leg: No edema.     Left lower leg: No edema.  Skin:     General: Skin is warm and dry.  Neurological:  Mental Status: He is alert and oriented to person, place, and time.  Psychiatric:        Mood and Affect: Mood normal.        Assessment & Plan:  [name] is a 72 y.o. male . Essential hypertension - Plan: Comprehensive metabolic panel  -Stable on current regimen, continue same.  Pedal edema is also improved.  Commended on sodium avoidance.  Check labs.  Labs.  Hyperlipidemia, unspecified hyperlipidemia type - Plan: Comprehensive metabolic panel, Lipid panel  -Tolerating current dose of statin, check updated labs.  Has follow-up in May, continue same dose pravastatin.  Thrush - Plan: fluconazole (DIFLUCAN) 150 MG tablet  -New concern, appears to have recurrence of thrush.  Unfortunately is at risk with use of steroid inhaler but has been compliant with rinsing mouth afterwards.  Diflucan 300 mg x 1 then 1 week of 150 mg daily with RTC precautions.  Meds ordered this encounter  Medications   fluconazole (DIFLUCAN) 150 MG tablet    Sig: Take 1 tablet (150 mg total) by mouth daily. Take 300mg  on day 1.    Dispense:  8 tablet    Refill:  0   Patient Instructions  Blood pressure looks good today.  No med changes.  I do think you might have an early case of thrush, start Diflucan 2 pills today, then 1 pill/day for 1 week.  Follow-up if that white coating does not improve or any worsening symptoms.  Keep follow-up in May as planned.  Take care!    Signed,   [name], MD [place] 12/11/22 11:02 AM

## 2022-12-11 NOTE — Telephone Encounter (Signed)
Called pharmacy and verified Rx directions

## 2022-12-12 ENCOUNTER — Ambulatory Visit: Payer: 59 | Admitting: Emergency Medicine

## 2022-12-12 ENCOUNTER — Encounter: Payer: Self-pay | Admitting: Emergency Medicine

## 2022-12-12 VITALS — BP 132/78 | HR 71 | Temp 98.2°F | Ht 72.0 in | Wt 225.0 lb

## 2022-12-12 DIAGNOSIS — J449 Chronic obstructive pulmonary disease, unspecified: Secondary | ICD-10-CM

## 2022-12-12 DIAGNOSIS — J309 Allergic rhinitis, unspecified: Secondary | ICD-10-CM

## 2022-12-12 DIAGNOSIS — R918 Other nonspecific abnormal finding of lung field: Secondary | ICD-10-CM | POA: Diagnosis not present

## 2022-12-12 MED ORDER — STIOLTO RESPIMAT 2.5-2.5 MCG/ACT IN AERS: 2.0000 | INHALATION_SPRAY | Freq: Every day | RESPIRATORY_TRACT | 0 refills | Status: DC

## 2022-12-12 NOTE — Progress Notes (Signed)
Subjective:    Patient ID: [name], male    DOB: [birth year], 72 y.o.   MRN: [id number]  HPI  ROV 09/02/21 --72 year old gentleman with a history of COPD, FEV1 3.02 L (94% predicted) in 2015.  Also with a history of pulmonary nodules which we followed with serial imaging and which were benign.  He deals with chronic rhinitis, GERD and chronic cough.  Currently managed on Anoro.  Today he reports that he is doing well. He is able to be active, still able to work at [employer]. Not limited by his breathing. No cough or wheezing. He can sometimes get strangled on liquids. Happens a few times a week. Remains on Anoro - does believe that it helps him. Uses his albuterol most mornings for a "jump start". Uses flonase daily.  Flu shot up to date, covid booster up to date.  He is supposed to have an MRI next week to evaluate a R renal complex lesion.   ROV 12/12/22 --72 year old man with COPD, history of benign pulmonary nodules that we followed with serial CT scans, chronic rhinitis and GERD with associated chronic cough.  We have been managing him on Anoro, Flonase, uses albuterol Today he reports that he has been having trouble with thrush - has happened to him twice in the last 6 months, currently on fluconazole. On this occasion, he flet stinging on his tongue, hasn't seen any plaques yet. Remains active, has to use albuterol very rarely (he has run out). No pred or abx since I last saw him.    Review of Systems  Constitutional:  Negative for fever and unexpected weight change.  HENT:  Negative for congestion, dental problem, ear pain, nosebleeds, postnasal drip, rhinorrhea, sinus pressure, sneezing, sore throat and trouble swallowing.   Eyes:  Negative for redness and itching.  Respiratory:  Negative for chest tightness, shortness of breath and wheezing.   Cardiovascular:  Negative for palpitations and leg swelling.       Knees   Gastrointestinal:  Negative for nausea and vomiting.   Genitourinary:  Negative for dysuria.  Musculoskeletal:  Negative for joint swelling.  Skin:  Negative for rash.  Neurological:  Negative for headaches.  Hematological:  Does not bruise/bleed easily.  Psychiatric/Behavioral:  Negative for dysphoric mood. The patient is not nervous/anxious.        Objective:   Physical Exam Vitals:   12/12/22 1523  BP: 132/78  Pulse: 71  Temp: 98.2 F (36.8 C)  TempSrc: Oral  SpO2: 97%  Weight: 225 lb (102.1 kg)  Height: 6' (1.829 m)   Gen: Pleasant, well-nourished, in no distress,  normal affect  ENT: No lesions,  mouth clear, no plaques,  oropharynx clear, no postnasal drip  Neck: No JVD, no stridor  Lungs: No use of accessory muscles, clear without rales or rhonchi  Cardiovascular: RRR, heart sounds normal, no murmur or gallops, no peripheral edema  Musculoskeletal: No deformities, no cyanosis or clubbing  Neuro: alert, non focal  Skin: Warm, no lesions or rash       Assessment & Plan:  COPD, mild He has been managed on Anoro, has had benefit from it but is dealing with tongue irritation.  There are no plaques on this occasion but he has had plaques before consistent with thrush.  Will consider changing him over to [facility] as an alternative.  Could also consider Spiriva Respimat.  Unclear why he would have higher incidence of thrush with Anoro in absence of an ICS, although  the powdered formulation may be a contributor.  We will try changing your Anoro over to Stiolto 2 puffs once daily.  We will send a prescription for this to your pharmacy. Keep your albuterol available to use 2 puffs when needed for shortness of breath, chest tightness, wheezing. Follow with APP in our office in about 1 month so we can determine how you are doing on the new inhaler Follow with Dr. [name] in 12 months or sooner if you have any problems.   Allergic rhinitis Continue Flonase as you have been taking it  Pulmonary nodules Benign on serial  imaging  Baltazar Apo, MD, PhD 12/12/2022, 3:54 PM [facility] [phone] or if no answer [phone]

## 2022-12-12 NOTE — Assessment & Plan Note (Signed)
Benign on serial imaging

## 2022-12-12 NOTE — Addendum Note (Signed)
Addended by: Gavin Potters R on: 12/12/2022 04:14 PM   Modules accepted: Orders

## 2022-12-12 NOTE — Patient Instructions (Addendum)
We will try changing your Anoro over to Stiolto 2 puffs once daily.  We will send a prescription for this to your pharmacy. Keep your albuterol available to use 2 puffs when needed for shortness of breath, chest tightness, wheezing. Continue Flonase as you have been taking it Follow with APP in our office in about 1 month so we can determine how you are doing on the new inhaler Follow with Dr. [name] in 12 months or sooner if you have any problems.

## 2022-12-12 NOTE — Assessment & Plan Note (Signed)
He has been managed on Anoro, has had benefit from it but is dealing with tongue irritation.  There are no plaques on this occasion but he has had plaques before consistent with thrush.  Will consider changing him over to [facility] as an alternative.  Could also consider Spiriva Respimat.  Unclear why he would have higher incidence of thrush with Anoro in absence of an ICS, although the powdered formulation may be a contributor.  We will try changing your Anoro over to Stiolto 2 puffs once daily.  We will send a prescription for this to your pharmacy. Keep your albuterol available to use 2 puffs when needed for shortness of breath, chest tightness, wheezing. Follow with APP in our office in about 1 month so we can determine how you are doing on the new inhaler Follow with Dr. [name] in 12 months or sooner if you have any problems.

## 2022-12-12 NOTE — Assessment & Plan Note (Signed)
Continue Flonase as you have been taking it 

## 2022-12-13 ENCOUNTER — Telehealth: Payer: Self-pay | Admitting: Emergency Medicine

## 2022-12-13 ENCOUNTER — Other Ambulatory Visit: Payer: Self-pay

## 2022-12-13 NOTE — Telephone Encounter (Signed)
ATC X1 LVM for patient to call the office back 

## 2022-12-13 NOTE — Telephone Encounter (Signed)
Patient states Stiolto Respimat is not at the pharmacy. Pharmacy is [facility]. Patient phone number is [phone].

## 2022-12-13 NOTE — Telephone Encounter (Signed)
Pt. Calling back he need the prescription for the Stiolto called to pharmacy and he never recieved sample in office

## 2022-12-13 NOTE — Telephone Encounter (Signed)
Patient states needs Albuterol and Stiolto inhalers. Pharmacy is [place] [facility]. Patient phone number is [phone].

## 2022-12-13 NOTE — Telephone Encounter (Signed)
Called pt, no answer. LVMM for pt to call back. Needs to clarify if is he taking about the stiolto.

## 2022-12-14 MED ORDER — STIOLTO RESPIMAT 2.5-2.5 MCG/ACT IN AERS: 2.0000 | INHALATION_SPRAY | Freq: Every day | RESPIRATORY_TRACT | 5 refills | Status: DC

## 2022-12-14 MED ORDER — ALBUTEROL SULFATE HFA 108 (90 BASE) MCG/ACT IN AERS: INHALATION_SPRAY | RESPIRATORY_TRACT | 3 refills | Status: DC

## 2022-12-14 NOTE — Telephone Encounter (Signed)
Called and spoke with patient. He was requesting to have the Stiolto and albuterol sent to [facility]. I advised him I would go ahead and send this in for him. He verbalized understanding.   Nothing further needed at time of call.

## 2023-01-05 ENCOUNTER — Telehealth: Payer: Self-pay | Admitting: Podiatry

## 2023-01-05 NOTE — Telephone Encounter (Signed)
Patient called and would like to have his handicap placard updated.

## 2023-01-22 ENCOUNTER — Ambulatory Visit: Payer: 59 | Admitting: Podiatry

## 2023-01-22 ENCOUNTER — Encounter: Payer: 59 | Admitting: Family Medicine

## 2023-02-13 ENCOUNTER — Telehealth: Payer: Self-pay | Admitting: Emergency Medicine

## 2023-02-13 ENCOUNTER — Encounter: Payer: Self-pay | Admitting: Family Medicine

## 2023-02-13 ENCOUNTER — Ambulatory Visit: Payer: 59 | Admitting: Family Medicine

## 2023-02-13 ENCOUNTER — Telehealth: Payer: Self-pay | Admitting: Family Medicine

## 2023-02-13 VITALS — BP 138/70 | HR 86 | Temp 99.8°F | Ht 72.0 in | Wt 229.1 lb

## 2023-02-13 DIAGNOSIS — B370 Candidal stomatitis: Secondary | ICD-10-CM | POA: Diagnosis not present

## 2023-02-13 MED ORDER — FLUCONAZOLE 150 MG PO TABS: 150.0000 mg | ORAL_TABLET | Freq: Every day | ORAL | 0 refills | Status: DC

## 2023-02-13 NOTE — Patient Instructions (Signed)
-  START taking Diflucan 150mg  tablet: Take 2 tablets today, then 1 tablet a day for the next 6 days.  -If not improved, follow up. -Recommend to follow up with pulmonary. Based on notes from your last visit, they wanted to follow up in 1 month to see how the inhalers are doing with oral thrush. They may need to change inhalers.

## 2023-02-13 NOTE — Telephone Encounter (Signed)
Pt calling in bc he is having Trush on tongue since Friday and he states its "chopped up". Pt states he was unable to even eat any food due to the pain on his tongue. Pt states he wants Dr. Delton Coombes to try to prescribe him a different inhaler that doesn't cause any thrush. He will be seeing his PCP today at 10 am.

## 2023-02-13 NOTE — Telephone Encounter (Signed)
Pt called and states he was seen this morning and dx with thrush . Wanting to know if he can take his inhaler while having thrush and on his new medicine ?

## 2023-02-13 NOTE — Telephone Encounter (Signed)
Caller name: DENI CARWELL  On DPR?: Yes  Call back number: [phone] (home)  Provider they see: Shade Flood, MD  Reason for call:  [place] would like to know if he should take inhaler while on newly prescribed meds?

## 2023-02-13 NOTE — Progress Notes (Signed)
Acute Office Visit   Subjective:  Patient ID: [name], male    DOB: 1950/12/14, 72 y.o.   MRN: [id number]  Chief Complaint  Patient presents with   Sore Throat    Pt is here today with C/O of thrush and sore throat. Pt reports this is the 3rd time this has happened. Pt reports he is rinsing his mouth out after using his inhaler.    Sore Throat    Patient is a 72 year old African American male that presents with oral thrush and sore throat with hoarseness. He reports symptoms started about 3 days ago. He reports this is his 3rd time having thrush. Last time treated was in March.  He uses inhalers for COPD management. He reports he rinses mouth afterwards of using inhalers. On previous pulmonary appointment in March, his Anoro inhaler was changed to [facility] to see if this would help with thrush and was suppose to have a follow up with APP in their office in about 1 month.   ROS See HPI above      Objective:   BP 138/70   Pulse 86   Temp 99.8 F (37.7 C)   Ht 6' (1.829 m)   Wt 229 lb 2 oz (103.9 kg)   SpO2 99%   BMI 31.07 kg/m    Physical Exam Vitals reviewed.  Constitutional:      General: He is not in acute distress.    Appearance: Normal appearance. He is not ill-appearing, toxic-appearing or diaphoretic.  HENT:     Head: Normocephalic and atraumatic.     Mouth/Throat:     Mouth: Mucous membranes are moist.     Pharynx: Uvula midline. No uvula swelling.     Comments: Thick white coating covering 90% of tongue.  Eyes:     General:        Right eye: No discharge.        Left eye: No discharge.     Conjunctiva/sclera: Conjunctivae normal.  Cardiovascular:     Rate and Rhythm: Normal rate and regular rhythm.     Heart sounds: Normal heart sounds. No murmur heard.    No friction rub. No gallop.  Pulmonary:     Effort: Pulmonary effort is normal. No respiratory distress.     Breath sounds: Normal breath sounds.  Musculoskeletal:        General: Normal  range of motion.  Skin:    General: Skin is warm and dry.  Neurological:     General: No focal deficit present.     Mental Status: He is alert and oriented to person, place, and time. Mental status is at baseline.  Psychiatric:        Mood and Affect: Mood normal.        Behavior: Behavior normal.        Thought Content: Thought content normal.        Judgment: Judgment normal.       Assessment & Plan:  Thrush, oral Assessment & Plan: Prescribed Diflucan 150mg  tablet: Take 2 tablets today, then 1 tablet a day for the next 6 days.  -If not improved, follow up. He has an appointment with Dr. Herma Carson next week.  -Recommend to follow up with pulmonary. Based on notes from last visit, they wanted to follow up in 1 month from March to see how the inhalers are doing with oral thrush. They may need to change inhalers.   Orders: -  Fluconazole; Take 1 tablet (150 mg total) by mouth daily. Take 300mg  on day 1.  Dispense: 8 tablet; Refill: 0   Zandra Abts, NP

## 2023-02-13 NOTE — Telephone Encounter (Signed)
Left pt a detailed Vm stating the message from [place]

## 2023-02-13 NOTE — Assessment & Plan Note (Addendum)
Prescribed Diflucan 150mg  tablet: Take 2 tablets today, then 1 tablet a day for the next 6 days.  -If not improved, follow up. He has an appointment with Dr. Herma Carson next week.  -Recommend to follow up with pulmonary. Based on notes from last visit, they wanted to follow up in 1 month from March to see how the inhalers are doing with oral thrush. They may need to change inhalers. Provided written information about oral thrush.

## 2023-02-14 NOTE — Telephone Encounter (Signed)
Patient is returning a call.  Would like to know how he should treat the thrush.  If patient does not answer, please leave a detailed message.  CB# [phone]

## 2023-02-14 NOTE — Telephone Encounter (Signed)
PT ret Cherina's call. Please try again. (Thank you Triage for reaching out to him. We know you are trying to get caught up.)  He has been at work but is avail now.

## 2023-02-14 NOTE — Telephone Encounter (Signed)
Called patient but he did not answer. Left message for patient to call back.  °

## 2023-02-14 NOTE — Telephone Encounter (Signed)
PT calling frustrated he got no call back w/i 24 hours. His condition is not getting better and he seeks a new inhaler. Pls call @ [phone]

## 2023-02-14 NOTE — Telephone Encounter (Signed)
Neither of these inhalers have a steroid, there isn't one that would be higher risk for thrush. Sometimes powdered inhalers can cause mouth pain that isn't infectious. I agree with testing both both for efficacy and for side effects.

## 2023-02-14 NOTE — Telephone Encounter (Signed)
Called patient.  Patient saw PCP Dr. [name] office and NP rx  Diflucan to treat the thrush.  Patient concerned that he has had  thrush 3 times this year.  Patient states he brushes his teeth BEFORE using the inhaler and then rinses mouth with water and gargles after using inhaler.  Also, patient is using Stiolto every 3 days, then altering with Anoro for 3 days to see which inhaler is working best for him.  Patient wants to know if the powder inhaler (Anoro) is more likely to cause thrush vs the vapor inhaler (Stiolto).  Patient made OV with APP for 03/19/2023. Please advise.

## 2023-02-15 NOTE — Telephone Encounter (Signed)
Spoke with the pt and notified of response per Dr Byrum  He verbalized understanding  Nothing further needed 

## 2023-02-19 ENCOUNTER — Ambulatory Visit (INDEPENDENT_AMBULATORY_CARE_PROVIDER_SITE_OTHER): Payer: 59 | Admitting: Family Medicine

## 2023-02-19 ENCOUNTER — Ambulatory Visit: Payer: 59 | Admitting: Podiatry

## 2023-02-19 VITALS — BP 130/70 | HR 91 | Ht 70.0 in | Wt 227.6 lb

## 2023-02-19 DIAGNOSIS — Z1283 Encounter for screening for malignant neoplasm of skin: Secondary | ICD-10-CM | POA: Diagnosis not present

## 2023-02-19 DIAGNOSIS — L918 Other hypertrophic disorders of the skin: Secondary | ICD-10-CM | POA: Diagnosis not present

## 2023-02-19 DIAGNOSIS — Z0000 Encounter for general adult medical examination without abnormal findings: Secondary | ICD-10-CM

## 2023-02-19 NOTE — Progress Notes (Unsigned)
Subjective:  Patient ID: [name], male    DOB: May 06, 1951  Age: 72 y.o. MRN: [id number]  CC:  Chief Complaint  Patient presents with   Annual Exam    CPE. He did eat 3 boiled eggs 2 hours ago.    HPI [name] presents for Annual Exam  Recently treated May 28 for thrush, Diflucan.  Getting better.  1 pill left.   Pulmonary, Dr. Delton Coombes.  COPD treated with Stiolto, albuterol as needed.  Last visit March 26 -Anoro changed to Stiolto at that time.  Continued on Flonase.  Has follow-up July 1st with pulmonary. Podiatry, Dr. Ardelle Anton, chronic heel pain. Dr. Berneice Heinrich, urology - Prostate cancer status post robotic prostatectomy 2017.  Renal cyst, stress incontinence.  August 2023 visit with 1 year follow-up planned.  PSA through urology. Dermatology, previously Dr. Jorja Loa, no recent dermatologist eval - needs new referral. Monitoring bump on back, skin tags and ca screening.  Cardiology, Dr. Peri Jefferson, Dr. Charlann Boxer  Hyperlipidemia, hypertension discussed in March, stable at that time with current regimen.  Prior pedal edema also improved with medication adjustments.  Lab Results  Component Value Date   CHOL 132 12/11/2022   HDL 35.50 (L) 12/11/2022   LDLCALC 71 12/11/2022   TRIG 129.0 12/11/2022   CHOLHDL 4 12/11/2022         02/13/2023   10:35 AM 12/11/2022   10:19 AM 09/13/2022    3:18 PM 08/14/2022    1:17 PM 08/03/2022   11:01 AM  Depression screen PHQ 2/9  Decreased Interest 0 0 0 0 0  Down, Depressed, Hopeless 0 0 0 0 0  PHQ - 2 Score 0 0 0 0 0  Altered sleeping 0 0 0 0 0  Tired, decreased energy 0 0 0 0 0  Change in appetite 0 0 0 0 0  Feeling bad or failure about yourself  0 0 0 0 0  Trouble concentrating 0 0 0 0 0  Moving slowly or fidgety/restless 0 0 0 0 0  Suicidal thoughts 0 0 0 0 0  PHQ-9 Score 0 0 0 0 0  Difficult doing work/chores Not difficult at all        Health Maintenance  Topic Date Due   Zoster Vaccines- Shingrix (2 of 2) 09/24/2018    COVID-19 Vaccine (3 - Pfizer risk series) 01/06/2020   INFLUENZA VACCINE  04/19/2023   Colonoscopy  05/13/2029   DTaP/Tdap/Td (2 - Td or Tdap) 10/07/2029   Pneumonia Vaccine 59+ Years old  Completed   Hepatitis C Screening  Completed   HPV VACCINES  Aged Out  2nd shingrix - at pharmacy - [facility]? Covid booster last year - recommend updated vaccine with flu vaccine this fall.   Colonoscopy 05/14/19.  Prostate:  Followed by urology.   Immunization History  Administered Date(s) Administered   Fluad Quad(high Dose 65+) 05/23/2019, 07/12/2020, 07/14/2021, 07/18/2022   Influenza Split 06/18/2013   Influenza,inj,Quad PF,6+ Mos 08/03/2015   Influenza,inj,quad, With Preservative 05/23/2019   Influenza-Unspecified 07/13/2014, 07/02/2016   PFIZER(Purple Top)SARS-COV-2 Vaccination 11/13/2019, 12/09/2019   Pneumococcal Conjugate-13 06/10/2018   Pneumococcal Polysaccharide-23 07/14/2019   Tdap 10/08/2019   Zoster Recombinat (Shingrix) 07/30/2018    No results found. Presents with prescription from his optometrist, evaluated 11/14/2022.  Dr. Rubye Oaks  Dental: once per year- dentures and some natural teeth.   Alcohol: none  Tobacco: none - off for over 20 years.   Exercise/Body mass index is 32.66 kg/m. Home situps, walking. Work  at [employer]. Part time.    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   Pulmonary nodules 02/23/2014   Tobacco use disorder, moderate, in sustained remission 02/23/2014   Recurrent right inguinal hernia 10/20/2013   ED (erectile dysfunction)  08/26/2012   Physical exam, annual 11/05/2011   HTN (hypertension) 11/05/2011   GERD (gastroesophageal reflux disease) 11/05/2011   Chronic joint pain 11/05/2011   Past Medical History:  Diagnosis Date   Allergy    seasonal   Arthritis    oa   Asthma    COPD (chronic obstructive pulmonary disease) (HCC)    GERD (gastroesophageal reflux disease)    Hypertension    Prostate cancer (HCC) 01/2016   prostatectomy    Shortness of breath dyspnea    Past Surgical History:  Procedure Laterality Date   HERNIA REPAIR  last done 3-4 yrs ago   x 2   INGUINAL HERNIA REPAIR Right 11/20/2013   Procedure: OPEN REPAIR OF RECURRENT RIGHT INGUINAL HERNIA WITH MESH;  Surgeon: Wilmon Arms. Corliss Skains, MD;  Location: [facility];  Service: General;  Laterality: Right;   INSERTION OF MESH Right 11/20/2013   Procedure: INSERTION OF MESH;  Surgeon: Wilmon Arms. Corliss Skains, MD;  Location: [facility];  Service: General;  Laterality: Right;   KNEE SURGERY Left july 2014   mcl and meniscus repair   LYMPHADENECTOMY Bilateral 02/09/2016   Procedure: PELVIC LYMPHADENECTOMY;  Surgeon: Sebastian Ache, MD;  Location: [facility];  Service: Urology;  Laterality: Bilateral;   right knee arthroscopy      ROBOT ASSISTED LAPAROSCOPIC RADICAL PROSTATECTOMY N/A 02/09/2016   Procedure: XI ROBOTIC ASSISTED LAPAROSCOPIC RADICAL PROSTATECTOMY WITH INDOCYANINE GREEN DYE AND ADHESIOLYSIS;  Surgeon: Sebastian Ache, MD;  Location: [facility];  Service: Urology;  Laterality: N/A;   TOTAL KNEE ARTHROPLASTY Right 09/06/2015   Procedure: RIGHT TOTAL KNEE ARTHROPLASTY;  Surgeon: Durene Romans, MD;  Location: [facility];  Service: Orthopedics;  Laterality: Right;   TOTAL KNEE ARTHROPLASTY Left 06/17/2019   Procedure: TOTAL KNEE ARTHROPLASTY;  Surgeon: Durene Romans, MD;  Location: [facility];  Service: Orthopedics;  Laterality: Left;  70 mins   Allergies  Allergen Reactions   Percocet [Oxycodone-Acetaminophen] Other (See Comments)    Nausea-Doesn't work   Amoxicillin Rash     Has patient had a PCN reaction causing immediate rash, facial/tongue/throat swelling, SOB or lightheadedness with hypotension: No Has patient had a PCN reaction causing severe rash involving mucus membranes or skin necrosis: No Has patient had a PCN reaction that required hospitalization No Has patient had a PCN reaction occurring within the last 10 years: Yes- 2016 If all of the above answers are "NO", then may proceed with Cephalosporin use.    Prior to Admission medications   Medication Sig Start Date End Date Taking? Authorizing Provider  albuterol (PROVENTIL HFA) 108 (90 Base) MCG/ACT inhaler Proventil HFA 90 mcg/actuation aerosol inhaler   2 puffs by inhalation route every 4-6 hours as needed. 12/14/22  Yes Leslye Peer, MD  amLODipine (NORVASC) 5 MG tablet Take 1 tablet (5 mg total)  by mouth daily. 10/02/22  Yes Shade Flood, MD  azelastine (OPTIVAR) 0.05 % ophthalmic solution Place 1 drop into both eyes 2 (two) times daily as needed. For itching/allergy symptoms 07/14/19  Yes Shade Flood, MD  Cholecalciferol (VITAMIN D3) 50 MCG (2000 UT) TABS Take 2,000 Units by mouth daily.   Yes [provider]  clobetasol cream (TEMOVATE) 0.05 % APPLY TOPICALLY TO AFFECTED AREA(S) NIGHTLY AT BEDTIME 08/30/20  Yes Janalyn Harder, MD  Clobetasol Prop Emollient Base (CLOBETASOL PROPIONATE E) 0.05 % emollient cream Apply 1 application topically at bedtime. 08/22/21  Yes Janalyn Harder, MD  esomeprazole (KLS ESOMEPRAZOLE MAGNESIUM) 20 MG capsule Take 1 capsule (20 mg total) by mouth daily. 07/24/22  Yes Shade Flood, MD  fluconazole (DIFLUCAN) 150 MG tablet Take 1 tablet (150 mg total) by mouth daily. Take 300mg  on day 1. 02/13/23  Yes Alveria Apley, NP  KLS ALLER-FLO 50 MCG/ACT nasal spray INSTILL TWO SPRAYS INTO BOTH NOSTRILS ONCE DAILY 12/05/22  Yes Shade Flood, MD  losartan (COZAAR) 50 MG tablet Take 1 tablet (50 mg total) by mouth daily. 09/13/22  Yes Shade Flood,  MD  meloxicam (MOBIC) 15 MG tablet Take 1 tablet (15 mg total) by mouth daily as needed for pain. 09/04/22 09/04/23 Yes Vivi Barrack, DPM  Menthol-Methyl Salicylate (SALONPAS PAIN RELIEF PATCH EX) Place 1 patch onto the skin daily as needed (pain.).   Yes [provider]  methocarbamol (ROBAXIN) 500 MG tablet Take 500 mg by mouth 3 (three) times daily. 08/02/21  Yes [provider]  methylPREDNISolone (MEDROL DOSEPAK) 4 MG TBPK tablet Take as directed 10/31/22  Yes Vivi Barrack, DPM  pravastatin (PRAVACHOL) 20 MG tablet Take 1 tablet (20 mg total) by mouth daily. 07/24/22  Yes Shade Flood, MD  Tiotropium Bromide-Olodaterol (STIOLTO RESPIMAT) 2.5-2.5 MCG/ACT AERS Inhale 2 puffs into the lungs daily. 12/14/22  Yes Leslye Peer, MD  triamcinolone cream (KENALOG) 0.1 % Apply 1 application topically 2 (two) times daily as needed. To affected areas only as needed. 01/08/19  Yes Shade Flood, MD  urea (CARMOL) 40 % CREA Apply 1 Application topically daily. 10/31/22  Yes Vivi Barrack, DPM  Tavaborole (KERYDIN) 5 % SOLN Apply 1 drop topically daily. Apply 1 drop to the toenail daily. Patient not taking: Reported on 02/13/2023 06/15/22   Vivi Barrack, DPM   Social History   Socioeconomic History   Marital status: Married    Spouse name: Not on file   Number of children: Not on file   Years of education: Not on file   Highest education level: Not on file  Occupational History   Not on file  Tobacco Use   Smoking status: Former    Packs/day: 1.50    Years: 20.00    Additional pack years: 0.00    Total pack years: 30.00    Types: Cigarettes    Quit date: 11/05/1999    Years since quitting: 23.3   Smokeless tobacco: Never  Vaping Use   Vaping Use: Never used  Substance and Sexual Activity   Alcohol use: No    Comment: last used 25 years ago    Drug use: Yes    Types: Cocaine    Comment: last used cocaine  and acid 25 yrs ago,none used since    Sexual activity: Not on file  Other Topics Concern   Not on file  Social History Narrative   Not on file  Social Determinants of Health   Financial Resource Strain: Not on file  Food Insecurity: Not on file  Transportation Needs: Not on file  Physical Activity: Not on file  Stress: Not on file  Social Connections: Not on file  Intimate Partner Violence: Not on file    Review of Systems 13 point review of systems per patient health survey noted.  Negative other than as indicated above or in HPI.    Objective:   Vitals:   02/19/23 1319  BP: 130/70  Pulse: 91  SpO2: 96%  Weight: 227 lb 9.6 oz (103.2 kg)  Height: 5\' 10"  (1.778 m)   {Vitals History (Optional):23777}  Physical Exam Vitals reviewed.  Constitutional:      Appearance: He is well-developed.  HENT:     Head: Normocephalic and atraumatic.     Right Ear: External ear normal.     Left Ear: External ear normal.     Mouth/Throat:     Comments: Scattered white coating on tongue. See photo.  Eyes:     Conjunctiva/sclera: Conjunctivae normal.     Pupils: Pupils are equal, round, and reactive to light.  Neck:     Thyroid: No thyromegaly.  Cardiovascular:     Rate and Rhythm: Normal rate and regular rhythm.     Heart sounds: Normal heart sounds.  Pulmonary:     Effort: Pulmonary effort is normal. No respiratory distress.     Breath sounds: Normal breath sounds. No wheezing.  Abdominal:     General: There is no distension.     Palpations: Abdomen is soft.     Tenderness: There is no abdominal tenderness.  Musculoskeletal:        General: No tenderness. Normal range of motion.     Cervical back: Normal range of motion and neck supple.  Lymphadenopathy:     Cervical: No cervical adenopathy.  Skin:    General: Skin is warm and dry.  Neurological:     Mental Status: He is alert and oriented to person, place, and time.     Deep Tendon Reflexes: Reflexes are normal and symmetric.  Psychiatric:         Behavior: Behavior normal.         Assessment & Plan:  [name] is a 72 y.o. male . No diagnosis found.   No orders of the defined types were placed in this encounter.  There are no Patient Instructions on file for this visit.    Signed,   Meredith Staggers, MD [facility], [facility] 02/19/23 1:34 PM

## 2023-02-19 NOTE — Patient Instructions (Addendum)
If mouth symptoms do not resolve, then follow up to repeat evaluation and testing. We can schedule a visit in 1 week - if everything is better, then can cancel that appointment.   Flu shot and covid booster this fall.   Take care!  Preventive Care 74 Years and Older, Male Preventive care refers to lifestyle choices and visits with your health care provider that can promote health and wellness. Preventive care visits are also called wellness exams. What can I expect for my preventive care visit? Counseling During your preventive care visit, your health care provider may ask about your: Medical history, including: Past medical problems. Family medical history. History of falls. Current health, including: Emotional well-being. Home life and relationship well-being. Sexual activity. Memory and ability to understand (cognition). Lifestyle, including: Alcohol, nicotine or tobacco, and drug use. Access to firearms. Diet, exercise, and sleep habits. Work and work Astronomer. Sunscreen use. Safety issues such as seatbelt and bike helmet use. Physical exam Your health care provider will check your: Height and weight. These may be used to calculate your BMI (body mass index). BMI is a measurement that tells if you are at a healthy weight. Waist circumference. This measures the distance around your waistline. This measurement also tells if you are at a healthy weight and may help predict your risk of certain diseases, such as type 2 diabetes and high blood pressure. Heart rate and blood pressure. Body temperature. Skin for abnormal spots. What immunizations do I need?  Vaccines are usually given at various ages, according to a schedule. Your health care provider will recommend vaccines for you based on your age, medical history, and lifestyle or other factors, such as travel or where you work. What tests do I need? Screening Your health care provider may recommend screening tests for  certain conditions. This may include: Lipid and cholesterol levels. Diabetes screening. This is done by checking your blood sugar (glucose) after you have not eaten for a while (fasting). Hepatitis C test. Hepatitis B test. HIV (human immunodeficiency virus) test. STI (sexually transmitted infection) testing, if you are at risk. Lung cancer screening. Colorectal cancer screening. Prostate cancer screening. Abdominal aortic aneurysm (AAA) screening. You may need this if you are a current or former smoker. Talk with your health care provider about your test results, treatment options, and if necessary, the need for more tests. Follow these instructions at home: Eating and drinking  Eat a diet that includes fresh fruits and vegetables, whole grains, lean protein, and low-fat dairy products. Limit your intake of foods with high amounts of sugar, saturated fats, and salt. Take vitamin and mineral supplements as recommended by your health care provider. Do not drink alcohol if your health care provider tells you not to drink. If you drink alcohol: Limit how much you have to 0-2 drinks a day. Know how much alcohol is in your drink. In the [place] one drink equals one 12 oz bottle of beer (355 mL), one 5 oz glass of wine (148 mL), or one 1 oz glass of hard liquor (44 mL). Lifestyle Brush your teeth every morning and night with fluoride toothpaste. Floss one time each day. Exercise for at least 30 minutes 5 or more days each week. Do not use any products that contain nicotine or tobacco. These products include cigarettes, chewing tobacco, and vaping devices, such as e-cigarettes. If you need help quitting, ask your health care provider. Do not use drugs. If you are sexually active, practice safe sex. Use a  condom or other form of protection to prevent STIs. Take aspirin only as told by your health care provider. Make sure that you understand how much to take and what form to take. Work with your  health care provider to find out whether it is safe and beneficial for you to take aspirin daily. Ask your health care provider if you need to take a cholesterol-lowering medicine (statin). Find healthy ways to manage stress, such as: Meditation, yoga, or listening to music. Journaling. Talking to a trusted person. Spending time with friends and family. Safety Always wear your seat belt while driving or riding in a vehicle. Do not drive: If you have been drinking alcohol. Do not ride with someone who has been drinking. When you are tired or distracted. While texting. If you have been using any mind-altering substances or drugs. Wear a helmet and other protective equipment during sports activities. If you have firearms in your house, make sure you follow all gun safety procedures. Minimize exposure to UV radiation to reduce your risk of skin cancer. What's next? Visit your health care provider once a year for an annual wellness visit. Ask your health care provider how often you should have your eyes and teeth checked. Stay up to date on all vaccines. This information is not intended to replace advice given to you by your health care provider. Make sure you discuss any questions you have with your health care provider. Document Revised: 03/02/2021 Document Reviewed: 03/02/2021 Elsevier Patient Education  2024 [employer].

## 2023-02-20 ENCOUNTER — Encounter: Payer: Self-pay | Admitting: Family Medicine

## 2023-02-26 ENCOUNTER — Encounter: Payer: Self-pay | Admitting: Family Medicine

## 2023-02-26 ENCOUNTER — Ambulatory Visit: Payer: 59 | Admitting: Family Medicine

## 2023-02-26 VITALS — BP 132/80 | HR 79 | Temp 99.4°F | Ht 70.0 in | Wt 227.6 lb

## 2023-02-26 DIAGNOSIS — B370 Candidal stomatitis: Secondary | ICD-10-CM | POA: Diagnosis not present

## 2023-02-26 NOTE — Patient Instructions (Signed)
Tongue looks better today.  If return of white patches I would like you to meet with specialist to see if other treatment or testing is recommended.  Let me know if that occurs.  Continue mouthwash and brushing teeth, tongue for now.  Return to the clinic or go to the nearest emergency room if any of your symptoms worsen or new symptoms occur.

## 2023-02-26 NOTE — Progress Notes (Signed)
Subjective:  Patient ID: [name], male    DOB: 1950-10-20  Age: 72 y.o. MRN: [id number]  CC:  Chief Complaint  Patient presents with   Follow-up    HPI [name] presents for   Thrush/oral coating: Follow-up from physical recently.  Treated May 28 for thrush with Diflucan, reported improvement in symptoms at last visit but still some coating.  He is not on any inhaled steroids, no recent prednisone.  No recent antibiotics. Also treated for thrush in December of last year, treated with Diflucan at that time. Finished diflucan and mouth lesions have resolved. Normal except slight tingling.  No dysphagia.  Brushing teeth once per day and brushes tongue. Crest complete mouthwash BID - doing this more - has been helpful. Prior tongue soreness resolved.   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   Pulmonary nodules 02/23/2014   Tobacco use disorder, moderate, in sustained remission 02/23/2014   Recurrent right inguinal hernia 10/20/2013   ED (erectile dysfunction) 08/26/2012   Physical exam, annual 11/05/2011   HTN (hypertension) 11/05/2011   GERD (gastroesophageal reflux disease) 11/05/2011   Chronic joint pain 11/05/2011   Past Medical History:  Diagnosis Date   Allergy    seasonal   Arthritis    oa   Asthma    COPD (chronic obstructive pulmonary disease) (HCC)    GERD (gastroesophageal reflux disease)    Hypertension    Prostate cancer (HCC) 01/2016   prostatectomy    Shortness of breath dyspnea    Past Surgical History:  Procedure  Laterality Date   HERNIA REPAIR  last done 3-4 yrs ago   x 2   INGUINAL HERNIA REPAIR Right 11/20/2013   Procedure: OPEN REPAIR OF RECURRENT RIGHT INGUINAL HERNIA WITH MESH;  Surgeon: Wilmon Arms. Corliss Skains, MD;  Location: [facility];  Service: General;  Laterality: Right;   INSERTION OF MESH Right 11/20/2013   Procedure: INSERTION OF MESH;  Surgeon: Wilmon Arms. Corliss Skains, MD;  Location: [facility];  Service: General;  Laterality: Right;   KNEE SURGERY Left july 2014   mcl and meniscus repair   LYMPHADENECTOMY Bilateral 02/09/2016   Procedure: PELVIC LYMPHADENECTOMY;  Surgeon: Sebastian Ache, MD;  Location: [facility];  Service: Urology;  Laterality: Bilateral;   right knee arthroscopy      ROBOT ASSISTED LAPAROSCOPIC RADICAL PROSTATECTOMY N/A 02/09/2016   Procedure: XI ROBOTIC ASSISTED LAPAROSCOPIC RADICAL PROSTATECTOMY WITH INDOCYANINE GREEN DYE AND ADHESIOLYSIS;  Surgeon: Sebastian Ache, MD;  Location: [facility];  Service: Urology;  Laterality: N/A;   TOTAL KNEE ARTHROPLASTY Right 09/06/2015   Procedure: RIGHT TOTAL KNEE ARTHROPLASTY;  Surgeon: Durene Romans, MD;  Location: [facility];  Service: Orthopedics;  Laterality: Right;   TOTAL KNEE ARTHROPLASTY Left 06/17/2019   Procedure: TOTAL KNEE ARTHROPLASTY;  Surgeon: Durene Romans, MD;  Location: [facility];  Service: Orthopedics;  Laterality: Left;  70 mins   Allergies  Allergen Reactions   Percocet [Oxycodone-Acetaminophen] Other (See Comments)    Nausea-Doesn't work   Amoxicillin Rash    Has patient had a  PCN reaction causing immediate rash, facial/tongue/throat swelling, SOB or lightheadedness with hypotension: No Has patient had a PCN reaction causing severe rash involving mucus membranes or skin necrosis: No Has patient had a PCN reaction that required hospitalization No Has patient had a PCN reaction occurring within the last 10 years: Yes- 2016 If all of the above answers are "NO", then may proceed with Cephalosporin use.    Prior to Admission medications    Medication Sig Start Date End Date Taking? Authorizing Provider  albuterol (PROVENTIL HFA) 108 (90 Base) MCG/ACT inhaler Proventil HFA 90 mcg/actuation aerosol inhaler   2 puffs by inhalation route every 4-6 hours as needed. 12/14/22  Yes Leslye Peer, MD  amLODipine (NORVASC) 5 MG tablet Take 1 tablet (5 mg total) by mouth daily. 10/02/22  Yes Shade Flood, MD  azelastine (OPTIVAR) 0.05 % ophthalmic solution Place 1 drop into both eyes 2 (two) times daily as needed. For itching/allergy symptoms 07/14/19  Yes Shade Flood, MD  Cholecalciferol (VITAMIN D3) 50 MCG (2000 UT) TABS Take 2,000 Units by mouth daily.   Yes [provider]  clobetasol cream (TEMOVATE) 0.05 % APPLY TOPICALLY TO AFFECTED AREA(S) NIGHTLY AT BEDTIME 08/30/20  Yes Janalyn Harder, MD  Clobetasol Prop Emollient Base (CLOBETASOL PROPIONATE E) 0.05 % emollient cream Apply 1 application topically at bedtime. 08/22/21  Yes Janalyn Harder, MD  esomeprazole (KLS ESOMEPRAZOLE MAGNESIUM) 20 MG capsule Take 1 capsule (20 mg total) by mouth daily. 07/24/22  Yes Shade Flood, MD  fluconazole (DIFLUCAN) 150 MG tablet Take 1 tablet (150 mg total) by mouth daily. Take 300mg  on day 1. 02/13/23  Yes Alveria Apley, NP  KLS ALLER-FLO 50 MCG/ACT nasal spray INSTILL TWO SPRAYS INTO BOTH NOSTRILS ONCE DAILY 12/05/22  Yes Shade Flood, MD  losartan (COZAAR) 50 MG tablet Take 1 tablet (50 mg total) by mouth daily. 09/13/22  Yes Shade Flood, MD  meloxicam (MOBIC) 15 MG tablet Take 1 tablet (15 mg total) by mouth daily as needed for pain. 09/04/22 09/04/23 Yes Vivi Barrack, DPM  Menthol-Methyl Salicylate (SALONPAS PAIN RELIEF PATCH EX) Place 1 patch onto the skin daily as needed (pain.).   Yes [provider]  methocarbamol (ROBAXIN) 500 MG tablet Take 500 mg by mouth 3 (three) times daily. 08/02/21  Yes [provider]  methylPREDNISolone (MEDROL DOSEPAK) 4 MG TBPK tablet Take as directed  10/31/22  Yes Vivi Barrack, DPM  pravastatin (PRAVACHOL) 20 MG tablet Take 1 tablet (20 mg total) by mouth daily. 07/24/22  Yes Shade Flood, MD  Tavaborole (KERYDIN) 5 % SOLN Apply 1 drop topically daily. Apply 1 drop to the toenail daily. 06/15/22  Yes Vivi Barrack, DPM  Tiotropium Bromide-Olodaterol (STIOLTO RESPIMAT) 2.5-2.5 MCG/ACT AERS Inhale 2 puffs into the lungs daily. 12/14/22  Yes Leslye Peer, MD  triamcinolone cream (KENALOG) 0.1 % Apply 1 application topically 2 (two) times daily as needed. To affected areas only as needed. 01/08/19  Yes Shade Flood, MD  urea (CARMOL) 40 % CREA Apply 1 Application topically daily. 10/31/22  Yes Vivi Barrack, DPM   Social History   Socioeconomic History   Marital status: Married    Spouse name: Not on file   Number of children: Not on file   Years of education: Not on file   Highest education level: Not on file  Occupational History   Not on file  Tobacco Use   Smoking status: Former  Packs/day: 1.50    Years: 20.00    Additional pack years: 0.00    Total pack years: 30.00    Types: Cigarettes    Quit date: 11/05/1999    Years since quitting: 23.3   Smokeless tobacco: Never  Vaping Use   Vaping Use: Never used  Substance and Sexual Activity   Alcohol use: No    Comment: last used 25 years ago    Drug use: Yes    Types: Cocaine    Comment: last used cocaine  and acid 25 yrs ago,none used since   Sexual activity: Not on file  Other Topics Concern   Not on file  Social History Narrative   Not on file   Social Determinants of Health   Financial Resource Strain: Not on file  Food Insecurity: Not on file  Transportation Needs: Not on file  Physical Activity: Not on file  Stress: Not on file  Social Connections: Not on file  Intimate Partner Violence: Not on file    Review of Systems Per HPI  Objective:   Vitals:   02/26/23 0918  BP: 132/80  Pulse: 79  Temp: 99.4 F (37.4 C)  TempSrc:  Temporal  SpO2: 96%  Weight: 227 lb 9.6 oz (103.2 kg)  Height: 5\' 10"  (1.778 m)     Physical Exam Constitutional:      General: He is not in acute distress.    Appearance: Normal appearance. He is well-developed.  HENT:     Head: Normocephalic and atraumatic.     Mouth/Throat:     Mouth: Mucous membranes are moist.     Pharynx: No oropharyngeal exudate or posterior oropharyngeal erythema.     Comments:  Min tongue coating but improved from previous visit.  No lesions buccal mucosa or posterior oropharynx. Cardiovascular:     Rate and Rhythm: Normal rate.  Pulmonary:     Effort: Pulmonary effort is normal.  Neurological:     Mental Status: He is alert and oriented to person, place, and time.  Psychiatric:        Mood and Affect: Mood normal.        Assessment & Plan:  [name] is a 72 y.o. male . Thrush, oral  -Likely thrush given improvement in appearance and symptoms status post Diflucan.  Differential includes geographic tongue, less likely with change noted, or leukoplakia.  RTC precautions if recurrent or not continue to improve back to baseline.  Consider oral surgeon evaluation.   No orders of the defined types were placed in this encounter.  Patient Instructions  Tongue looks better today.  If return of white patches I would like you to meet with specialist to see if other treatment or testing is recommended.  Let me know if that occurs.  Continue mouthwash and brushing teeth, tongue for now.  Return to the clinic or go to the nearest emergency room if any of your symptoms worsen or new symptoms occur.     Signed,   Meredith Staggers, MD [facility], [facility] 02/26/23 10:05 AM

## 2023-03-12 ENCOUNTER — Ambulatory Visit: Payer: 59 | Admitting: Podiatry

## 2023-03-13 ENCOUNTER — Telehealth: Payer: Self-pay

## 2023-03-13 NOTE — Telephone Encounter (Signed)
Chantel can be reached as a resource to aid patient in care resources [phone] 8-5 Central time

## 2023-03-19 ENCOUNTER — Ambulatory Visit: Payer: 59 | Admitting: Nurse Practitioner

## 2023-03-19 ENCOUNTER — Encounter: Payer: Self-pay | Admitting: Nurse Practitioner

## 2023-03-19 ENCOUNTER — Other Ambulatory Visit: Payer: Self-pay | Admitting: Family Medicine

## 2023-03-19 VITALS — BP 128/84 | HR 109 | Ht 71.0 in | Wt 227.4 lb

## 2023-03-19 DIAGNOSIS — I10 Essential (primary) hypertension: Secondary | ICD-10-CM

## 2023-03-19 DIAGNOSIS — B370 Candidal stomatitis: Secondary | ICD-10-CM

## 2023-03-19 DIAGNOSIS — R918 Other nonspecific abnormal finding of lung field: Secondary | ICD-10-CM

## 2023-03-19 DIAGNOSIS — J449 Chronic obstructive pulmonary disease, unspecified: Secondary | ICD-10-CM

## 2023-03-19 MED ORDER — STIOLTO RESPIMAT 2.5-2.5 MCG/ACT IN AERS: 2.0000 | INHALATION_SPRAY | Freq: Every day | RESPIRATORY_TRACT | 5 refills | Status: DC

## 2023-03-19 NOTE — Assessment & Plan Note (Signed)
Considered benign on previous serial CT imaging

## 2023-03-19 NOTE — Assessment & Plan Note (Signed)
Reports of recurrent oral thrush, treated by his PCP 3 times this year. Last course in May 2024 with diflucan. Symptoms have resolved. I am not convinced this is due to his inhaler as he does not have an ICS. It is possible that the dry powder was/is playing a contributing factor. We will discontinue the Anoro and have him use Stiolto. If he has recurrence, would recommend further evaluation for other underlying etiologies.

## 2023-03-19 NOTE — Progress Notes (Signed)
@Patient  ID: [name], male    DOB: November 07, 1950, 72 y.o.   MRN: [id number]  Chief Complaint  Patient presents with   Follow-up    Pt states he does not have any concerns, breathing is doing good. Pt is using stiolto and anoro     Referring provider: Shade Flood, MD  HPI: 72 year old male, former smoker followed for COPD and lung nodules.  He is a patient of Dr. Kavin Leech and last seen in office 12/12/2022.  Past medical history significant for hypertension, GERD, allergies, prostate cancer, ED.  TEST/EVENTS:  03/23/2014 PFT: FVC 100 FEV1 94, ratio 73, TLC 78, DLCO 77.  No BD 07/12/2015 CT chest without contrast: No LAD.  Multiple tiny bilateral pulmonary nodules, unchanged.  9 mm previously seen has resolved.  Lungs are clear otherwise.  12/12/2022: OV with Dr. Delton Coombes.  History of benign pulmonary nodules that we followed with serial CT scans.  Managed on Anoro, Flonase.  Rare use of albuterol.  Having trouble with a rash.  Had to be treated twice in the last 6 months.  Currently on fluconazole.  He had felt stinging on his tongue but had not seen any plaques yet.  Consider change to Stiolto as powdered formulation may be contributor.    03/19/2023: Today-follow-up Patient presents today for follow-up.  He had been having issues with thrush.  Was treated with Diflucan in May.  Symptoms have resolved.  He was instructed at his last visit to try changing to Stiolto from [facility] to see if the dry powder was a contributing factor.  He has been using both of these and switching back and forth.  He is not sure if 1 works better than the other.  Sometimes he thinks the Norco works better but both seem to do just fine.  He is not having any increased shortness of breath.  Rarely uses his albuterol.  Denies any wheezing or chest congestion.  Allergies  Allergen Reactions   Percocet [Oxycodone-Acetaminophen] Other (See Comments)    Nausea-Doesn't work   Amoxicillin Rash    Has patient had a  PCN reaction causing immediate rash, facial/tongue/throat swelling, SOB or lightheadedness with hypotension: No Has patient had a PCN reaction causing severe rash involving mucus membranes or skin necrosis: No Has patient had a PCN reaction that required hospitalization No Has patient had a PCN reaction occurring within the last 10 years: Yes- 2016 If all of the above answers are "NO", then may proceed with Cephalosporin use.     Immunization History  Administered Date(s) Administered   Fluad Quad(high Dose 65+) 05/23/2019, 07/12/2020, 07/14/2021, 07/18/2022   Influenza Split 06/18/2013   Influenza,inj,Quad PF,6+ Mos 08/03/2015   Influenza,inj,quad, With Preservative 05/23/2019   Influenza-Unspecified 07/13/2014, 07/02/2016   PFIZER(Purple Top)SARS-COV-2 Vaccination 11/13/2019, 12/09/2019   Pneumococcal Conjugate-13 06/10/2018   Pneumococcal Polysaccharide-23 07/14/2019   Tdap 10/08/2019   Zoster Recombinant(Shingrix) 07/30/2018, 07/01/2021    Past Medical History:  Diagnosis Date   Allergy    seasonal   Arthritis    oa   Asthma    COPD (chronic obstructive pulmonary disease) (HCC)    GERD (gastroesophageal reflux disease)    Hypertension    Prostate cancer (HCC) 01/2016   prostatectomy    Shortness of breath dyspnea     Tobacco History: Social History   Tobacco Use  Smoking Status Former   Packs/day: 1.50   Years: 20.00   Additional pack years: 0.00   Total pack years: 30.00   Types:  Cigarettes   Quit date: 11/05/1999   Years since quitting: 23.3  Smokeless Tobacco Never   Counseling given: Not Answered   Outpatient Medications Prior to Visit  Medication Sig Dispense Refill   albuterol (PROVENTIL HFA) 108 (90 Base) MCG/ACT inhaler Proventil HFA 90 mcg/actuation aerosol inhaler   2 puffs by inhalation route every 4-6 hours as needed. 18 g 3   amLODipine (NORVASC) 5 MG tablet TAKE ONE TABLET BY MOUTH ONE TIME DAILY 90 tablet 0   azelastine (OPTIVAR) 0.05 %  ophthalmic solution Place 1 drop into both eyes 2 (two) times daily as needed. For itching/allergy symptoms 6 mL 1   Cholecalciferol (VITAMIN D3) 50 MCG (2000 UT) TABS Take 2,000 Units by mouth daily.     clobetasol cream (TEMOVATE) 0.05 % APPLY TOPICALLY TO AFFECTED AREA(S) NIGHTLY AT BEDTIME 60 g 0   Clobetasol Prop Emollient Base (CLOBETASOL PROPIONATE E) 0.05 % emollient cream Apply 1 application topically at bedtime. 60 g 11   esomeprazole (KLS ESOMEPRAZOLE MAGNESIUM) 20 MG capsule Take 1 capsule (20 mg total) by mouth daily. 90 capsule 3   fluconazole (DIFLUCAN) 150 MG tablet Take 1 tablet (150 mg total) by mouth daily. Take 300mg  on day 1. 8 tablet 0   KLS ALLER-FLO 50 MCG/ACT nasal spray INSTILL TWO SPRAYS INTO BOTH NOSTRILS ONCE DAILY 91 mL 0   losartan (COZAAR) 50 MG tablet Take 1 tablet (50 mg total) by mouth daily. 90 tablet 1   meloxicam (MOBIC) 15 MG tablet Take 1 tablet (15 mg total) by mouth daily as needed for pain. 30 tablet 0   Menthol-Methyl Salicylate (SALONPAS PAIN RELIEF PATCH EX) Place 1 patch onto the skin daily as needed (pain.).     methocarbamol (ROBAXIN) 500 MG tablet Take 500 mg by mouth 3 (three) times daily.     methylPREDNISolone (MEDROL DOSEPAK) 4 MG TBPK tablet Take as directed 21 tablet 0   pravastatin (PRAVACHOL) 20 MG tablet Take 1 tablet (20 mg total) by mouth daily. 90 tablet 3   Tavaborole (KERYDIN) 5 % SOLN Apply 1 drop topically daily. Apply 1 drop to the toenail daily. 10 mL 2   triamcinolone cream (KENALOG) 0.1 % Apply 1 application topically 2 (two) times daily as needed. To affected areas only as needed. 30 g 0   urea (CARMOL) 40 % CREA Apply 1 Application topically daily. 198 g 0   Tiotropium Bromide-Olodaterol (STIOLTO RESPIMAT) 2.5-2.5 MCG/ACT AERS Inhale 2 puffs into the lungs daily. 4 g 5   Facility-Administered Medications Prior to Visit  Medication Dose Route Frequency Provider Last Rate Last Admin   triamcinolone acetonide (KENALOG) 10 MG/ML  injection 10 mg  10 mg Other Once Vivi Barrack, DPM         Review of Systems:   Constitutional: No weight loss or gain, night sweats, fevers, chills, fatigue, or lassitude. HEENT: No headaches, difficulty swallowing, tooth/dental problems, or sore throat. No sneezing, itching, ear ache, nasal congestion, or post nasal drip CV:  No chest pain, orthopnea, PND, swelling in lower extremities, anasarca, dizziness, palpitations, syncope Resp: No shortness of breath with exertion or at rest. No excess mucus or change in color of mucus. No productive or non-productive. No hemoptysis. No wheezing.  No chest wall deformity GI:  No heartburn, indigestion Skin: No rash, lesions, ulcerations MSK:  No joint pain or swelling.   Neuro: No dizziness or lightheadedness.  Psych: No depression or anxiety. Mood stable.     Physical Exam:  BP  128/84   Pulse (!) 109   Ht 5\' 11"  (1.803 m)   Wt 227 lb 6.4 oz (103.1 kg)   SpO2 96%   BMI 31.72 kg/m   GEN: Pleasant, interactive, well-appearing; obese; in no acute distress. HEENT:  Normocephalic and atraumatic. PERRLA. Sclera white. Nasal turbinates pink, moist and patent bilaterally. No rhinorrhea present. Oropharynx pink and moist, without exudate or edema. No lesions, ulcerations, or postnasal drip.  NECK:  Supple w/ fair ROM. No JVD present. Normal carotid impulses w/o bruits. Thyroid symmetrical with no goiter or nodules palpated. No lymphadenopathy.   CV: RRR, no m/r/g, no peripheral edema. Pulses intact, +2 bilaterally. No cyanosis, pallor or clubbing. PULMONARY:  Unlabored, regular breathing. Clear bilaterally A&P w/o wheezes/rales/rhonchi. No accessory muscle use.  GI: BS present and normoactive. Soft, non-tender to palpation. No organomegaly or masses detected. MSK: No erythema, warmth or tenderness. Cap refil <2 sec all extrem. No deformities or joint swelling noted.  Neuro: A/Ox3. No focal deficits noted.   Skin: Warm, no lesions or  rashe Psych: Normal affect and behavior. Judgement and thought content appropriate.     Lab Results:  CBC    Component Value Date/Time   WBC 5.2 08/03/2022 1301   RBC 5.41 08/03/2022 1301   HGB 16.2 08/03/2022 1301   HGB 16.0 07/12/2020 1210   HCT 49.4 08/03/2022 1301   HCT 47.6 07/12/2020 1210   PLT 269.0 08/03/2022 1301   PLT 281 07/12/2020 1210   MCV 91.3 08/03/2022 1301   MCV 90 07/12/2020 1210   MCH 30.0 03/14/2022 1459   MCHC 32.9 08/03/2022 1301   RDW 13.6 08/03/2022 1301   RDW 13.1 07/12/2020 1210   LYMPHSABS 1,956 03/14/2022 1459   MONOABS 1.1 (H) 05/13/2019 1220   EOSABS 270 03/14/2022 1459   BASOSABS 72 03/14/2022 1459    BMET    Component Value Date/Time   NA 139 12/11/2022 1110   NA 138 07/12/2020 1210   K 4.2 12/11/2022 1110   CL 102 12/11/2022 1110   CO2 30 12/11/2022 1110   GLUCOSE 100 (H) 12/11/2022 1110   BUN 12 12/11/2022 1110   BUN 13 07/12/2020 1210   CREATININE 0.85 12/11/2022 1110   CREATININE 0.92 08/03/2015 0950   CALCIUM 9.5 12/11/2022 1110   GFRNONAA 90 07/12/2020 1210   GFRAA 104 07/12/2020 1210    BNP No results found for: "BNP"   Imaging:  No results found.       Latest Ref Rng & Units 03/23/2014    9:28 AM  PFT Results  FVC-Pre L 4.13   FVC-Predicted Pre % 100   FVC-Post L 4.12   FVC-Predicted Post % 99   Pre FEV1/FVC % % 73   Post FEV1/FCV % % 73   FEV1-Pre L 3.02   FEV1-Predicted Pre % 94   FEV1-Post L 3.00   DLCO uncorrected ml/min/mmHg 26.00   DLCO UNC% % 77   DLVA Predicted % 86   TLC L 5.63   TLC % Predicted % 78   RV % Predicted % 53     No results found for: "NITRICOXIDE"      Assessment & Plan:   COPD, mild Mild COPD. Compensated on current regimen. No recent exacerbations or hospitalizations. He will continue LABA/LAMA therapy with Stiolto. New rx sent today. Medication education provided. Action plan in place. Encouraged to remain active.  Patient Instructions  Stop Anoro. Continue  Stiolto 2 puffs daily  Continue Albuterol inhaler 2 puffs every 6  hours as needed for shortness of breath or wheezing. Notify if symptoms persist despite rescue inhaler/neb use.   Follow up with Dr. Delton Coombes as scheduled. If symptoms worsen, please contact office for sooner follow up or seek emergency care.    Pulmonary nodules Considered benign on previous serial CT imaging  Thrush Reports of recurrent oral thrush, treated by his PCP 3 times this year. Last course in May 2024 with diflucan. Symptoms have resolved. I am not convinced this is due to his inhaler as he does not have an ICS. It is possible that the dry powder was/is playing a contributing factor. We will discontinue the Anoro and have him use Stiolto. If he has recurrence, would recommend further evaluation for other underlying etiologies.    I spent 32 minutes of dedicated to the care of this patient on the date of this encounter to include pre-visit review of records, face-to-face time with the patient discussing conditions above, post visit ordering of testing, clinical documentation with the electronic health record, making appropriate referrals as documented, and communicating necessary findings to members of the patients care team.  Noemi Chapel, NP 03/19/2023  Pt aware and understands NP's role.

## 2023-03-19 NOTE — Patient Instructions (Addendum)
Stop Anoro. Continue Stiolto 2 puffs daily  Continue Albuterol inhaler 2 puffs every 6 hours as needed for shortness of breath or wheezing. Notify if symptoms persist despite rescue inhaler/neb use.   Follow up with Dr. Delton Coombes as scheduled. If symptoms worsen, please contact office for sooner follow up or seek emergency care.

## 2023-03-19 NOTE — Assessment & Plan Note (Signed)
Mild COPD. Compensated on current regimen. No recent exacerbations or hospitalizations. He will continue LABA/LAMA therapy with Stiolto. New rx sent today. Medication education provided. Action plan in place. Encouraged to remain active.  Patient Instructions  Stop Anoro. Continue Stiolto 2 puffs daily  Continue Albuterol inhaler 2 puffs every 6 hours as needed for shortness of breath or wheezing. Notify if symptoms persist despite rescue inhaler/neb use.   Follow up with Dr. Delton Coombes as scheduled. If symptoms worsen, please contact office for sooner follow up or seek emergency care.

## 2023-04-09 ENCOUNTER — Ambulatory Visit: Payer: 59 | Admitting: Podiatry

## 2023-04-30 ENCOUNTER — Ambulatory Visit: Payer: 59 | Admitting: Podiatry

## 2023-04-30 DIAGNOSIS — M2042 Other hammer toe(s) (acquired), left foot: Secondary | ICD-10-CM

## 2023-04-30 DIAGNOSIS — L84 Corns and callosities: Secondary | ICD-10-CM

## 2023-04-30 DIAGNOSIS — M216X1 Other acquired deformities of right foot: Secondary | ICD-10-CM

## 2023-04-30 NOTE — Progress Notes (Signed)
Subjective: Chief Complaint  Patient presents with   Foot Pain    PF RIGHT, HASN'T BEEN WEARING THE BRACE, BEEN DOING THE STRECTCHES, PAIN IS GETTING BETTER   Callouses    BILAT CORN LEFT 41RD   72 year old male presents the office with concerns of left heel pain as well as callus on the right foot along the sesamoid.  The heel pain is been getting better.  His main concern today is the calluses.  He is asking about other options for this.  He is not using the urea cream.'s are tender with pressure.  He is also tried changing shoes.  Objective: AAO x3, NAD DP/PT pulses palpable bilaterally, CRT less than 3 seconds The heels improved.  There is no area pinpoint tenderness. Hyperkeratotic lesion noted right foot submetatarsal without any underlying ulceration drainage or signs of infection but this appears to be along the area of the sesamoid. Hyperkeratotic lesion noted the distal aspect left third toe due to hammertoe deformity.  There is no underlying ulceration drainage or signs of infection. No pain with calf compression, swelling, warmth, erythema  Assessment: 72 year old male with plantar fasciitis, hyperkeratotic calluses  Plan: -All treatment options discussed with the patient including all alternatives, risks, complications.  -Sharply debrided the hyperkeratotic lesions bilaterally.  Discussed possible flexor tenotomy of the left third toe we may need to consider sesamoid excision, planing.  He has tried shoe modifications, orthotics, urea and continues to get callus which is causing pain.  He will consider his options.  Return in about 3 months (around 07/31/2023), or if symptoms worsen or fail to improve, for pre-ulcerative callus.  Vivi Barrack DPM

## 2023-04-30 NOTE — Patient Instructions (Signed)

## 2023-05-10 ENCOUNTER — Other Ambulatory Visit: Payer: Self-pay | Admitting: Family Medicine

## 2023-05-10 DIAGNOSIS — I10 Essential (primary) hypertension: Secondary | ICD-10-CM

## 2023-05-11 ENCOUNTER — Ambulatory Visit: Payer: 59 | Admitting: Emergency Medicine

## 2023-05-11 ENCOUNTER — Encounter: Payer: Self-pay | Admitting: Emergency Medicine

## 2023-05-11 VITALS — BP 130/82 | HR 92 | Ht 71.0 in | Wt 226.2 lb

## 2023-05-11 DIAGNOSIS — J449 Chronic obstructive pulmonary disease, unspecified: Secondary | ICD-10-CM

## 2023-05-11 NOTE — Assessment & Plan Note (Signed)
Overall doing well without any evidence of exacerbations.  He tolerates Stiolto and has not had any more tongue burning or symptoms of possible thrush.  He rinses and gargles, brushes his tongue.  Minimal albuterol use.  No flares.  Please continue your Stiolto 2 puffs once daily. Keep your albuterol available to use 2 puffs when needed for shortness of breath, chest tightness, wheezing. Continue to rinse and gargle well, brush her tongue to avoid tongue irritation or thrush Get your flu shot this fall You would benefit from getting the COVID-19 vaccine as well Follow with Dr. Delton Coombes in 1 year, sooner if you have any problems.

## 2023-05-11 NOTE — Progress Notes (Signed)
Subjective:    Patient ID: [name], male    DOB: 11-17-1950, 72 y.o.   MRN: [id number]  HPI  ROV 12/12/22 --72 year old man with COPD, history of benign pulmonary nodules that we followed with serial CT scans, chronic rhinitis and GERD with associated chronic cough.  We have been managing him on Anoro, Flonase, uses albuterol Today he reports that he has been having trouble with thrush - has happened to him twice in the last 6 months, currently on fluconazole. On this occasion, he flet stinging on his tongue, hasn't seen any plaques yet. Remains active, has to use albuterol very rarely (he has run out). No pred or abx since I last saw him.   ROV 05/11/2023 --follow-up visit for 72 year old man with COPD, history of benign pulmonary nodules that were followed with serial CTs.  Also with chronic cough in the setting of GERD, rhinitis.  Last seen in our office in July at which time he was concerned about recurrent thrush symptoms.  He has tongue burning and stinging.  Not always associated with white plaquing.  He was continued on Stiolto. He is able to be active, still working at [employer] - has to lift and walk a lot. Rare SABA use. Some occasional cough, dry. Sometimes has some swallowing difficulty liquids, gets "strangled".  No abx or pred since last time.    Review of Systems  Constitutional:  Negative for fever and unexpected weight change.  HENT:  Negative for congestion, dental problem, ear pain, nosebleeds, postnasal drip, rhinorrhea, sinus pressure, sneezing, sore throat and trouble swallowing.   Eyes:  Negative for redness and itching.  Respiratory:  Negative for chest tightness, shortness of breath and wheezing.   Cardiovascular:  Negative for palpitations and leg swelling.       Knees   Gastrointestinal:  Negative for nausea and vomiting.  Genitourinary:  Negative for dysuria.  Musculoskeletal:  Negative for joint swelling.  Skin:  Negative for rash.  Neurological:  Negative  for headaches.  Hematological:  Does not bruise/bleed easily.  Psychiatric/Behavioral:  Negative for dysphoric mood. The patient is not nervous/anxious.        Objective:   Physical Exam Vitals:   05/11/23 1439  BP: 130/82  Pulse: 92  SpO2: 93%  Weight: 226 lb 3.2 oz (102.6 kg)  Height: 5\' 11"  (1.803 m)   Gen: Pleasant, well-nourished, in no distress,  normal affect  ENT: No lesions,  mouth clear, no plaques,  oropharynx clear, no postnasal drip  Neck: No JVD, no stridor  Lungs: No use of accessory muscles, clear without rales or rhonchi  Cardiovascular: RRR, heart sounds normal, no murmur or gallops, no peripheral edema  Musculoskeletal: No deformities, no cyanosis or clubbing  Neuro: alert, non focal  Skin: Warm, no lesions or rash       Assessment & Plan:  COPD, mild Overall doing well without any evidence of exacerbations.  He tolerates Stiolto and has not had any more tongue burning or symptoms of possible thrush.  He rinses and gargles, brushes his tongue.  Minimal albuterol use.  No flares.  Please continue your Stiolto 2 puffs once daily. Keep your albuterol available to use 2 puffs when needed for shortness of breath, chest tightness, wheezing. Continue to rinse and gargle well, brush her tongue to avoid tongue irritation or thrush Get your flu shot this fall You would benefit from getting the COVID-19 vaccine as well Follow with Dr. Delton Coombes in 1 year, sooner if you  have any problems.   Levy Pupa, MD, PhD 05/11/2023, 3:01 PM [facility] [phone] or if no answer [phone]

## 2023-05-11 NOTE — Patient Instructions (Addendum)
Please continue your Stiolto 2 puffs once daily. Keep your albuterol available to use 2 puffs when needed for shortness of breath, chest tightness, wheezing. Continue to rinse and gargle well, brush her tongue to avoid tongue irritation or thrush Get your flu shot this fall You would benefit from getting the COVID-19 vaccine as well Follow with Dr. Delton Coombes in 1 year, sooner if you have any problems.

## 2023-06-18 ENCOUNTER — Encounter: Payer: Self-pay | Admitting: Family Medicine

## 2023-06-18 ENCOUNTER — Ambulatory Visit: Payer: 59 | Admitting: Family Medicine

## 2023-06-18 VITALS — BP 110/68 | HR 77 | Temp 98.3°F | Ht 71.0 in | Wt 223.8 lb

## 2023-06-18 DIAGNOSIS — B370 Candidal stomatitis: Secondary | ICD-10-CM

## 2023-06-18 DIAGNOSIS — R1010 Upper abdominal pain, unspecified: Secondary | ICD-10-CM | POA: Diagnosis not present

## 2023-06-18 DIAGNOSIS — J449 Chronic obstructive pulmonary disease, unspecified: Secondary | ICD-10-CM

## 2023-06-18 MED ORDER — FLUCONAZOLE 150 MG PO TABS: 150.0000 mg | ORAL_TABLET | Freq: Every day | ORAL | 0 refills | Status: DC

## 2023-06-18 NOTE — Progress Notes (Signed)
Subjective:  Patient ID: [name], male    DOB: 08/30/1951  Age: 72 y.o. MRN: [id number]  CC:  Chief Complaint  Patient presents with   Thrush    Patient complains of tongue pain and white patches x3 days    HPI [name] presents for   Thrush: Previously treated for thrush in June.  Had been treated in May as well with Diflucan, some improvement and then some residual tongue coating.  Treated for thrush last December.  We did discuss the possibility of geographic tongue, or leukoplakia.  He has used inhalers for COPD. Started with burning in mouth 3 days ago. White patches past 3 days.  Using mouthwash, rinsing mouth after inhaler.  No wt loss, night sweats, fevers.  Stopped stiolto 3 days ago d/t thrush. Plans to restart.. min wheeze today.  No pain or soreness with swallowing.  No n/v.   Upper abdominal soreness. No fever.  More sore past month.told had abdominal wall hernia about 6 years ago at time of prostate surgery.  Getting bigger and more sore past month. No n/v, no bowel changes.    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   Pain in left knee 06/02/2019   Allergic rhinitis 03/12/2017   Thrush 02/27/2017   SUI (stress urinary incontinence), male 03/20/2016   Prostate cancer (HCC) 02/09/2016   Obese 09/08/2015   S/P right TKA 09/06/2015   S/P knee replacement 09/06/2015   COPD, mild (HCC) 07/20/2014   Pulmonary nodules 02/23/2014   Tobacco use disorder, moderate, in sustained remission 02/23/2014   Recurrent right inguinal hernia 10/20/2013   ED (erectile dysfunction) 08/26/2012   Physical exam, annual 11/05/2011   HTN (hypertension) 11/05/2011   GERD (gastroesophageal reflux disease) 11/05/2011   Chronic joint pain 11/05/2011   Past Medical History:  Diagnosis Date   Allergy    seasonal   Arthritis     oa   Asthma    COPD (chronic obstructive pulmonary disease) (HCC)    GERD (gastroesophageal reflux disease)    Hypertension    Prostate cancer (HCC) 01/2016   prostatectomy    Shortness of breath dyspnea    Past Surgical History:  Procedure Laterality Date   HERNIA REPAIR  last done 3-4 yrs ago   x 2   INGUINAL HERNIA REPAIR Right 11/20/2013   Procedure: OPEN REPAIR OF RECURRENT RIGHT INGUINAL HERNIA WITH MESH;  Surgeon: Wilmon Arms. Corliss Skains, MD;  Location: [facility];  Service: General;  Laterality: Right;   INSERTION OF MESH Right 11/20/2013   Procedure: INSERTION OF MESH;  Surgeon: Wilmon Arms. Corliss Skains, MD;  Location: [facility];  Service: General;  Laterality: Right;   KNEE SURGERY Left july 2014   mcl and meniscus repair   LYMPHADENECTOMY Bilateral 02/09/2016   Procedure: PELVIC LYMPHADENECTOMY;  Surgeon: Sebastian Ache, MD;  Location: [facility];  Service: Urology;  Laterality: Bilateral;   right knee arthroscopy      ROBOT ASSISTED LAPAROSCOPIC RADICAL PROSTATECTOMY N/A 02/09/2016   Procedure: XI ROBOTIC ASSISTED LAPAROSCOPIC RADICAL PROSTATECTOMY WITH INDOCYANINE GREEN DYE AND ADHESIOLYSIS;  Surgeon: Sebastian Ache, MD;  Location: [facility];  Service: Urology;  Laterality: N/A;   TOTAL KNEE ARTHROPLASTY Right 09/06/2015   Procedure: RIGHT TOTAL KNEE ARTHROPLASTY;  Surgeon: Durene Romans, MD;  Location: [facility];  Service: Orthopedics;  Laterality: Right;   TOTAL  KNEE ARTHROPLASTY Left 06/17/2019   Procedure: TOTAL KNEE ARTHROPLASTY;  Surgeon: Durene Romans, MD;  Location: [facility];  Service: Orthopedics;  Laterality: Left;  70 mins   Allergies  Allergen Reactions   Percocet [Oxycodone-Acetaminophen] Other (See Comments)    Nausea-Doesn't work   Amoxicillin Rash    Has patient had a PCN reaction causing immediate rash, facial/tongue/throat swelling, SOB or lightheadedness with hypotension: No Has patient had a PCN reaction causing severe rash involving mucus membranes or skin necrosis: No Has patient had a  PCN reaction that required hospitalization No Has patient had a PCN reaction occurring within the last 10 years: Yes- 2016 If all of the above answers are "NO", then may proceed with Cephalosporin use.    Prior to Admission medications   Medication Sig Start Date End Date Taking? Authorizing Provider  albuterol (PROVENTIL HFA) 108 (90 Base) MCG/ACT inhaler Proventil HFA 90 mcg/actuation aerosol inhaler   2 puffs by inhalation route every 4-6 hours as needed. 12/14/22  Yes Leslye Peer, MD  amLODipine (NORVASC) 5 MG tablet TAKE ONE TABLET BY MOUTH ONE TIME DAILY 03/19/23  Yes Shade Flood, MD  azelastine (OPTIVAR) 0.05 % ophthalmic solution Place 1 drop into both eyes 2 (two) times daily as needed. For itching/allergy symptoms 07/14/19  Yes Shade Flood, MD  Cholecalciferol (VITAMIN D3) 50 MCG (2000 UT) TABS Take 2,000 Units by mouth daily.   Yes [provider]  clobetasol cream (TEMOVATE) 0.05 % APPLY TOPICALLY TO AFFECTED AREA(S) NIGHTLY AT BEDTIME 08/30/20  Yes Janalyn Harder, MD  Clobetasol Prop Emollient Base (CLOBETASOL PROPIONATE E) 0.05 % emollient cream Apply 1 application topically at bedtime. 08/22/21  Yes Janalyn Harder, MD  esomeprazole (KLS ESOMEPRAZOLE MAGNESIUM) 20 MG capsule Take 1 capsule (20 mg total) by mouth daily. 07/24/22  Yes Shade Flood, MD  KLS ALLER-FLO 50 MCG/ACT nasal spray INSTILL TWO SPRAYS INTO BOTH NOSTRILS ONCE DAILY 12/05/22  Yes Shade Flood, MD  losartan (COZAAR) 50 MG tablet TAKE ONE TABLET BY MOUTH ONE TIME DAILY 05/10/23  Yes Shade Flood, MD  meloxicam (MOBIC) 15 MG tablet Take 1 tablet (15 mg total) by mouth daily as needed for pain. 09/04/22 09/04/23 Yes Vivi Barrack, DPM  Menthol-Methyl Salicylate (SALONPAS PAIN RELIEF PATCH EX) Place 1 patch onto the skin daily as needed (pain.).   Yes [provider]  methocarbamol (ROBAXIN) 500 MG tablet Take 500 mg by mouth 3 (three) times daily. 08/02/21  Yes [provider]  pravastatin (PRAVACHOL) 20 MG tablet Take 1 tablet (20 mg total) by mouth daily. 07/24/22  Yes Shade Flood, MD  Tavaborole (KERYDIN) 5 % SOLN Apply 1 drop topically daily. Apply 1 drop to the toenail daily. 06/15/22  Yes Vivi Barrack, DPM  Tiotropium Bromide-Olodaterol (STIOLTO RESPIMAT) 2.5-2.5 MCG/ACT AERS Inhale 2 puffs into the lungs daily. 03/19/23  Yes Cobb, Ruby Cola, NP  triamcinolone cream (KENALOG) 0.1 % Apply 1 application topically 2 (two) times daily as needed. To affected areas only as needed. 01/08/19  Yes Shade Flood, MD  urea (CARMOL) 40 % CREA Apply 1 Application topically daily. 10/31/22  Yes Vivi Barrack, DPM   Social History   Socioeconomic History   Marital status: Married    Spouse name: Not on file   Number of children: Not on file   Years of education: Not on file   Highest education level: Not on file  Occupational History   Not on file  Tobacco Use   Smoking status: Former    Current packs/day: 0.00    Average packs/day: 1.5 packs/day for 20.0 years (30.0 ttl pk-yrs)    Types: Cigarettes    Start date: 11/05/1979    Quit date: 11/05/1999    Years since quitting: 23.6   Smokeless tobacco: Never  Vaping Use   Vaping status: Never Used  Substance and Sexual Activity   Alcohol use: No    Comment: last used 25 years ago    Drug use: Yes    Types: Cocaine    Comment: last used cocaine  and acid 25 yrs ago,none used since   Sexual activity: Not on file  Other Topics Concern   Not on file  Social History Narrative   Not on file   Social Determinants of Health   Financial Resource Strain: Not on file  Food Insecurity: Not on file  Transportation Needs: Not on file  Physical Activity: Not on file  Stress: Not on file  Social Connections: Not on file  Intimate Partner Violence: Not on file    Review of Systems   Objective:   Vitals:   06/18/23 1120  BP: 110/68  Pulse: 77  Temp: 98.3 F (36.8 C)  TempSrc:  Oral  SpO2: 97%  Weight: 223 lb 12.8 oz (101.5 kg)  Height: 5\' 11"  (1.803 m)     Physical Exam Vitals reviewed.  Constitutional:      General: He is not in acute distress.    Appearance: Normal appearance. He is well-developed.  HENT:     Head: Normocephalic and atraumatic.     Mouth/Throat:     Mouth: Mucous membranes are moist.     Comments: White patches on tongue, no patches seen on buccal mucosa or posterior pharynx/throat. Cardiovascular:     Rate and Rhythm: Normal rate.  Pulmonary:     Effort: Pulmonary effort is normal.  Abdominal:     General: Abdomen is flat.     Tenderness: There is abdominal tenderness (Palpation epigastrium, upper abdomen, midline.  No appreciable hernia palpated.).  Neurological:     Mental Status: He is alert and oriented to person, place, and time.  Psychiatric:        Mood and Affect: Mood normal.         Assessment & Plan:  [name] is a 72 y.o. male . Thrush - Plan: fluconazole (DIFLUCAN) 150 MG tablet  -Likely related to inhaler use.  Treat with Diflucan again, 1 week course, monitor for improvement, resolution with recheck in 2 weeks.  Continue to rinse mouth after use of inhalers.  No sign of esophageal candidiasis at this time, RTC precautions.  Upper abdominal pain - Plan: CT ABDOMEN PELVIS WO CONTRAST  -Possible ventral hernia.  Increasing discomfort as above.  No signs of incarcerated hernia on exam at this time, no bowel symptoms.  ER precautions given, check CT abdomen/pelvis to evaluate for definitive hernia then likely surgical referral  COPD, mild (HCC)  -Minimal breakthrough symptoms off inhalers, restart with RTC/ER precautions.  Meds ordered this encounter  Medications   fluconazole (DIFLUCAN) 150 MG tablet    Sig: Take 1 tablet (150 mg total) by mouth daily. 300mg  for first dose.    Dispense:  8 tablet    Refill:  0   Patient Instructions  Start diflucan, restart inhalers. Recheck in 2 weeks.  Return  to the clinic or go to the nearest emergency room if any of your symptoms worsen  or new symptoms occur.  I will order ct scan for possible hernia. If vomiting or acute worsening be seen in the emergency room. Depending on CT scan may need to meet with surgeon.  Take care!  Oral Thrush, Adult Oral thrush, also called oral candidiasis, is a fungal infection that develops in the mouth and throat and on the tongue. It causes white patches to form in the mouth and on the tongue. Many cases of thrush are mild, but this infection can also be serious. Ginette Pitman can be a repeated (recurrent) problem for certain people who have a weak body defense system (immune system). The weakness can be caused by chronic illnesses, or by taking medicines that limit the body's ability to fight infection. If a person has difficulty fighting infection, the fungus that causes thrush can spread through the body. This can cause life-threatening blood or organ infections. What are the causes? This condition is caused by a fungus (yeast) called Candida albicans. This fungus is normally present in small amounts in the mouth and on other mucous membranes. It usually causes no harm. If conditions are present that allow the fungus to grow without control, it invades surrounding tissues and becomes an infection. Other Candida species can also lead to thrush, though this is rare. What increases the risk? The following factors may make you more likely to develop this condition: Having a weakened immune system. Being an older adult. Having diabetes, cancer, or HIV (human immunodeficiency virus). Having dry mouth (xerostomia). Being pregnant or breastfeeding. Having poor dental care, especially in those who have dentures. Using antibiotic or steroid medicines. What are the signs or symptoms? Symptoms of this condition can vary from mild and moderate to severe and persistent. Symptoms may include: A burning feeling in the mouth and  throat. This can occur at the start of a thrush infection. White patches that stick to the mouth and tongue. The tissue around the patches may be red, raw, and painful. If rubbed (during tooth brushing, for example), the patches and the tissue of the mouth may bleed easily. A bad taste in the mouth or difficulty tasting foods. A cottony feeling in the mouth. Pain during eating and swallowing. Poor appetite. Cracking at the corners of the mouth. How is this diagnosed? This condition is diagnosed based on: A physical exam. Your medical history. How is this treated? This condition is treated with medicines called antifungals, which prevent the growth of fungi. These medicines are either applied directly to the affected area (topical) or swallowed (oral). The treatment will depend on the severity of the condition. Mild cases of thrush may be treated with an antifungal mouth rinse or lozenges. Treatment usually lasts about 14 days. Moderate to severe cases of thrush can be treated with oral antifungal medicine, if they have spread to the esophagus. A topical antifungal medicine may also be used. For some severe infections, treatment may need to continue for more than 14 days. Oral antifungal medicines are rarely used during pregnancy because they may be harmful to the unborn child. If you are pregnant, talk with your health care provider about options for treatment. Persistent or recurrent thrush. For cases of thrush that do not go away or keep coming back: Treatment may be needed twice as long as the symptoms last. Treatment will include both oral and topical antifungal medicines. People with a weakened immune system can take an antifungal medicine on a continuous basis to prevent thrush infections. It is important to treat conditions that  make a person more likely to get thrush, such as diabetes or HIV. Follow these instructions at home: Relieving soreness and discomfort To help reduce the  discomfort of thrush: Drink cold liquids such as water or iced tea. Try flavored ice treats or frozen juices. Eat foods that are easy to swallow, such as gelatin, ice cream, or custard. Try drinking from a straw if the patches in your mouth are painful.  General instructions Take or use over-the-counter and prescription medicines only as told by your health care provider. Eat plain, unflavored yogurt as directed by your health care provider. Check the label to make sure the yogurt contains live cultures. This yogurt can help healthy bacteria grow in the mouth and can stop the growth of the fungus that causes thrush. If you wear dentures, remove the dentures before going to bed, brush them vigorously, and soak them in a cleaning solution as directed by your health care provider. Rinse your mouth with a warm salt-water mixture several times a day. To make a salt-water mixture, dissolve -1 tsp (3-6 g) of salt in 1 cup (237 mL) of warm water. Contact a health care provider if: Your symptoms are getting worse or are not improving within 7 days of starting treatment. You have symptoms of a spreading infection, such as white patches on the skin outside of the mouth. You are breastfeeding your baby and you have redness and pain in the nipples. Summary Oral thrush, also called oral candidiasis, is a fungal infection that develops in the mouth and throat and on the tongue. It causes white patches to form in the mouth and on the tongue. You are more likely to get this condition if you have a weakened immune system or an underlying condition, such as HIV, cancer, or diabetes. This condition is treated with medicines called antifungals, which prevent the growth of fungi. Contact a health care provider if your symptoms do not improve, or get worse, within 7 days of starting treatment. This information is not intended to replace advice given to you by your health care provider. Make sure you discuss any  questions you have with your health care provider. Document Revised: 08/21/2022 Document Reviewed: 08/21/2022 Elsevier Patient Education  2024 [employer]         Signed,   Meredith Staggers, MD [facility], [facility] 06/18/23 12:17 PM

## 2023-06-18 NOTE — Patient Instructions (Addendum)
Start diflucan, restart inhalers. Recheck in 2 weeks.  Return to the clinic or go to the nearest emergency room if any of your symptoms worsen or new symptoms occur.  I will order ct scan for possible hernia. If vomiting or acute worsening be seen in the emergency room. Depending on CT scan may need to meet with surgeon.  Take care!  Oral Thrush, Adult Oral thrush, also called oral candidiasis, is a fungal infection that develops in the mouth and throat and on the tongue. It causes white patches to form in the mouth and on the tongue. Many cases of thrush are mild, but this infection can also be serious. [name] can be a repeated (recurrent) problem for certain people who have a weak body defense system (immune system). The weakness can be caused by chronic illnesses, or by taking medicines that limit the body's ability to fight infection. If a person has difficulty fighting infection, the fungus that causes thrush can spread through the body. This can cause life-threatening blood or organ infections. What are the causes? This condition is caused by a fungus (yeast) called Candida albicans. This fungus is normally present in small amounts in the mouth and on other mucous membranes. It usually causes no harm. If conditions are present that allow the fungus to grow without control, it invades surrounding tissues and becomes an infection. Other Candida species can also lead to thrush, though this is rare. What increases the risk? The following factors may make you more likely to develop this condition: Having a weakened immune system. Being an older adult. Having diabetes, cancer, or HIV (human immunodeficiency virus). Having dry mouth (xerostomia). Being pregnant or breastfeeding. Having poor dental care, especially in those who have dentures. Using antibiotic or steroid medicines. What are the signs or symptoms? Symptoms of this condition can vary from mild and moderate to severe and  persistent. Symptoms may include: A burning feeling in the mouth and throat. This can occur at the start of a thrush infection. White patches that stick to the mouth and tongue. The tissue around the patches may be red, raw, and painful. If rubbed (during tooth brushing, for example), the patches and the tissue of the mouth may bleed easily. A bad taste in the mouth or difficulty tasting foods. A cottony feeling in the mouth. Pain during eating and swallowing. Poor appetite. Cracking at the corners of the mouth. How is this diagnosed? This condition is diagnosed based on: A physical exam. Your medical history. How is this treated? This condition is treated with medicines called antifungals, which prevent the growth of fungi. These medicines are either applied directly to the affected area (topical) or swallowed (oral). The treatment will depend on the severity of the condition. Mild cases of thrush may be treated with an antifungal mouth rinse or lozenges. Treatment usually lasts about 14 days. Moderate to severe cases of thrush can be treated with oral antifungal medicine, if they have spread to the esophagus. A topical antifungal medicine may also be used. For some severe infections, treatment may need to continue for more than 14 days. Oral antifungal medicines are rarely used during pregnancy because they may be harmful to the unborn child. If you are pregnant, talk with your health care provider about options for treatment. Persistent or recurrent thrush. For cases of thrush that do not go away or keep coming back: Treatment may be needed twice as long as the symptoms last. Treatment will include both oral and topical antifungal medicines.  People with a weakened immune system can take an antifungal medicine on a continuous basis to prevent thrush infections. It is important to treat conditions that make a person more likely to get thrush, such as diabetes or HIV. Follow these instructions  at home: Relieving soreness and discomfort To help reduce the discomfort of thrush: Drink cold liquids such as water or iced tea. Try flavored ice treats or frozen juices. Eat foods that are easy to swallow, such as gelatin, ice cream, or custard. Try drinking from a straw if the patches in your mouth are painful.  General instructions Take or use over-the-counter and prescription medicines only as told by your health care provider. Eat plain, unflavored yogurt as directed by your health care provider. Check the label to make sure the yogurt contains live cultures. This yogurt can help healthy bacteria grow in the mouth and can stop the growth of the fungus that causes thrush. If you wear dentures, remove the dentures before going to bed, brush them vigorously, and soak them in a cleaning solution as directed by your health care provider. Rinse your mouth with a warm salt-water mixture several times a day. To make a salt-water mixture, dissolve -1 tsp (3-6 g) of salt in 1 cup (237 mL) of warm water. Contact a health care provider if: Your symptoms are getting worse or are not improving within 7 days of starting treatment. You have symptoms of a spreading infection, such as white patches on the skin outside of the mouth. You are breastfeeding your baby and you have redness and pain in the nipples. Summary Oral thrush, also called oral candidiasis, is a fungal infection that develops in the mouth and throat and on the tongue. It causes white patches to form in the mouth and on the tongue. You are more likely to get this condition if you have a weakened immune system or an underlying condition, such as HIV, cancer, or diabetes. This condition is treated with medicines called antifungals, which prevent the growth of fungi. Contact a health care provider if your symptoms do not improve, or get worse, within 7 days of starting treatment. This information is not intended to replace advice given to  you by your health care provider. Make sure you discuss any questions you have with your health care provider. Document Revised: 08/21/2022 Document Reviewed: 08/21/2022 Elsevier Patient Education  2024 [employer].

## 2023-06-25 ENCOUNTER — Other Ambulatory Visit: Payer: Self-pay | Admitting: Family Medicine

## 2023-06-25 DIAGNOSIS — R12 Heartburn: Secondary | ICD-10-CM

## 2023-06-25 DIAGNOSIS — I10 Essential (primary) hypertension: Secondary | ICD-10-CM

## 2023-06-28 ENCOUNTER — Ambulatory Visit
Admission: RE | Admit: 2023-06-28 | Discharge: 2023-06-28 | Disposition: A | Discharge status: Home / Self Care | Payer: 59 | Source: Ambulatory Visit | Attending: Family Medicine | Admitting: Family Medicine

## 2023-06-28 DIAGNOSIS — R1010 Upper abdominal pain, unspecified: Secondary | ICD-10-CM

## 2023-07-02 ENCOUNTER — Ambulatory Visit: Payer: 59 | Admitting: Family Medicine

## 2023-07-02 ENCOUNTER — Encounter: Payer: Self-pay | Admitting: Family Medicine

## 2023-07-02 VITALS — BP 110/68 | HR 82 | Temp 98.4°F | Ht 71.0 in | Wt 225.4 lb

## 2023-07-02 DIAGNOSIS — B370 Candidal stomatitis: Secondary | ICD-10-CM | POA: Diagnosis not present

## 2023-07-02 DIAGNOSIS — M6208 Separation of muscle (nontraumatic), other site: Secondary | ICD-10-CM

## 2023-07-02 NOTE — Patient Instructions (Signed)
Exam is reassuring today.  If the rash returns, follow-up with me we can discuss next step or possible specialty eval to figure out why you continue to have that infection.  No changes for now.  CT scan was overall reassuring, diastasis recti is likely the cause of your symptoms but I do not think any surgery or specific treatment is needed at this time.  Let me know if there are questions.  Recheck in 2 months to review your chronic medications and labs. Take care!

## 2023-07-02 NOTE — Progress Notes (Signed)
Subjective:  Patient ID: [name], male    DOB: Jan 08, 1951  Age: 72 y.o. MRN: [id number]  CC:  Chief Complaint  Patient presents with   Follow-up    Pt reports he has noticed his thrush is better. Pt has no other concerns.    HPI [name] presents for   Thrush: Diagnosed September 30.  He does use inhalers for COPD but rinses afterwards. No steroid inhaler.  Had noted burning sensation in his mouth 3 days prior and white patches during that time, unrelieved with mouthwash.  He had temporarily stopped his Stiolto due to thrush.  Restarted last visit as some flare of wheeze.  Denied any odynophagia/dysphagia.  Similar symptoms in the past, most recent treatment with Diflucan 150 mg 2 x 1, then daily dosing for 1 week.  Improved, no pain in mouth or with swallowing. Completed diflucan. Back to use of inhaler, white patches resolved.   Upper abdominal pain He had been advised he had abdominal wall hernia about 6 years ago at the time of his surgery.  Felt like his symptoms were increasing for the previous month with enlarged area and soreness but no nausea, vomiting or bowel changes.  CT abdomen pelvis ordered, October 11, sigmoid diverticulosis without evidence of acute diverticulitis, aortic atherosclerosis, known solid mass at lower pole left kidney without signs of interval change compared to previous imaging, but no acute abdominopelvic findings and specifically no sign of abdominal wall hernia.  No abd pain, only sore with leaning over things - possible diastasis recti. Advised from his urologist on Friday that this was likely issue and kidney area stable.   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   Pain in left knee 06/02/2019   Allergic rhinitis 03/12/2017   Thrush 02/27/2017   SUI (stress urinary incontinence), male 03/20/2016    Prostate cancer (HCC) 02/09/2016   Obese 09/08/2015   S/P right TKA 09/06/2015   S/P knee replacement 09/06/2015   COPD, mild (HCC) 07/20/2014   Pulmonary nodules 02/23/2014   Tobacco use disorder, moderate, in sustained remission 02/23/2014   Recurrent right inguinal hernia 10/20/2013   ED (erectile dysfunction) 08/26/2012   Physical exam, annual 11/05/2011   HTN (hypertension) 11/05/2011   GERD (gastroesophageal reflux disease) 11/05/2011   Chronic joint pain 11/05/2011   Past Medical History:  Diagnosis Date   Allergy    seasonal   Arthritis    oa   Asthma    COPD (chronic obstructive pulmonary disease) (HCC)    GERD (gastroesophageal reflux disease)    Hypertension    Prostate cancer (HCC) 01/2016   prostatectomy    Shortness of breath dyspnea    Thrush    Past Surgical History:  Procedure Laterality Date   HERNIA REPAIR  last done 3-4 yrs ago   x 2   INGUINAL HERNIA REPAIR Right 11/20/2013   Procedure: OPEN REPAIR OF RECURRENT RIGHT INGUINAL HERNIA WITH MESH;  Surgeon: Wilmon Arms. Corliss Skains, MD;  Location: [facility];  Service: General;  Laterality: Right;   INSERTION OF MESH Right 11/20/2013   Procedure: INSERTION OF MESH;  Surgeon: Wilmon Arms. Corliss Skains, MD;  Location: [facility];  Service: General;  Laterality: Right;   KNEE SURGERY Left july 2014   mcl and meniscus repair   LYMPHADENECTOMY Bilateral 02/09/2016   Procedure: PELVIC LYMPHADENECTOMY;  Surgeon:  Sebastian Ache, MD;  Location: [facility];  Service: Urology;  Laterality: Bilateral;   right knee arthroscopy      ROBOT ASSISTED LAPAROSCOPIC RADICAL PROSTATECTOMY N/A 02/09/2016   Procedure: XI ROBOTIC ASSISTED LAPAROSCOPIC RADICAL PROSTATECTOMY WITH INDOCYANINE GREEN DYE AND ADHESIOLYSIS;  Surgeon: Sebastian Ache, MD;  Location: [facility];  Service: Urology;  Laterality: N/A;   TOTAL KNEE ARTHROPLASTY Right 09/06/2015   Procedure: RIGHT TOTAL KNEE ARTHROPLASTY;  Surgeon: Durene Romans, MD;  Location: [facility];  Service: Orthopedics;   Laterality: Right;   TOTAL KNEE ARTHROPLASTY Left 06/17/2019   Procedure: TOTAL KNEE ARTHROPLASTY;  Surgeon: Durene Romans, MD;  Location: [facility];  Service: Orthopedics;  Laterality: Left;  70 mins   Allergies  Allergen Reactions   Percocet [Oxycodone-Acetaminophen] Other (See Comments)    Nausea-Doesn't work   Amoxicillin Rash    Has patient had a PCN reaction causing immediate rash, facial/tongue/throat swelling, SOB or lightheadedness with hypotension: No Has patient had a PCN reaction causing severe rash involving mucus membranes or skin necrosis: No Has patient had a PCN reaction that required hospitalization No Has patient had a PCN reaction occurring within the last 10 years: Yes- 2016 If all of the above answers are "NO", then may proceed with Cephalosporin use.    Prior to Admission medications   Medication Sig Start Date End Date Taking? Authorizing Provider  albuterol (PROVENTIL HFA) 108 (90 Base) MCG/ACT inhaler Proventil HFA 90 mcg/actuation aerosol inhaler   2 puffs by inhalation route every 4-6 hours as needed. 12/14/22  Yes Leslye Peer, MD  amLODipine (NORVASC) 5 MG tablet TAKE ONE TABLET BY MOUTH ONCE A DAY 06/26/23  Yes Shade Flood, MD  azelastine (OPTIVAR) 0.05 % ophthalmic solution Place 1 drop into both eyes 2 (two) times daily as needed. For itching/allergy symptoms 07/14/19  Yes Shade Flood, MD  Cholecalciferol (VITAMIN D3) 50 MCG (2000 UT) TABS Take 2,000 Units by mouth daily.   Yes [provider]  clobetasol cream (TEMOVATE) 0.05 % APPLY TOPICALLY TO AFFECTED AREA(S) NIGHTLY AT BEDTIME 08/30/20  Yes Janalyn Harder, MD  Clobetasol Prop Emollient Base (CLOBETASOL PROPIONATE E) 0.05 % emollient cream Apply 1 application topically at bedtime. 08/22/21  Yes Janalyn Harder, MD  KLS ALLER-FLO 50 MCG/ACT nasal spray INSTILL TWO SPRAYS INTO BOTH NOSTRILS ONCE DAILY 12/05/22  Yes Shade Flood, MD  KLS ESOMEPRAZOLE MAGNESIUM 20 MG capsule TAKE ONE  CAPSULE BY MOUTH ONE TIME DAILY 06/26/23  Yes Shade Flood, MD  losartan (COZAAR) 50 MG tablet TAKE ONE TABLET BY MOUTH ONE TIME DAILY 05/10/23  Yes Shade Flood, MD  meloxicam (MOBIC) 15 MG tablet Take 1 tablet (15 mg total) by mouth daily as needed for pain. 09/04/22 09/04/23 Yes Vivi Barrack, DPM  Menthol-Methyl Salicylate (SALONPAS PAIN RELIEF PATCH EX) Place 1 patch onto the skin daily as needed (pain.).   Yes [provider]  methocarbamol (ROBAXIN) 500 MG tablet Take 500 mg by mouth 3 (three) times daily. 08/02/21  Yes [provider]  pravastatin (PRAVACHOL) 20 MG tablet Take 1 tablet (20 mg total) by mouth daily. 07/24/22  Yes Shade Flood, MD  Tiotropium Bromide-Olodaterol (STIOLTO RESPIMAT) 2.5-2.5 MCG/ACT AERS Inhale 2 puffs into the lungs daily. 03/19/23  Yes Cobb, Ruby Cola, NP  triamcinolone cream (KENALOG) 0.1 % Apply 1 application topically 2 (two) times daily as needed. To affected areas only as needed. 01/08/19  Yes Shade Flood, MD  urea (CARMOL) 40 % CREA  Apply 1 Application topically daily. 10/31/22  Yes Vivi Barrack, DPM  fluconazole (DIFLUCAN) 150 MG tablet Take 1 tablet (150 mg total) by mouth daily. 300mg  for first dose. Patient not taking: Reported on 07/02/2023 06/18/23   Shade Flood, MD  [facility]) 5 % SOLN Apply 1 drop topically daily. Apply 1 drop to the toenail daily. Patient not taking: Reported on 07/02/2023 06/15/22   Vivi Barrack, DPM   Social History   Socioeconomic History   Marital status: Married    Spouse name: Not on file   Number of children: Not on file   Years of education: Not on file   Highest education level: Not on file  Occupational History   Not on file  Tobacco Use   Smoking status: Former    Current packs/day: 0.00    Average packs/day: 1.5 packs/day for 20.0 years (30.0 ttl pk-yrs)    Types: Cigarettes    Start date: 11/05/1979    Quit date: 11/05/1999    Years since  quitting: 23.6   Smokeless tobacco: Never  Vaping Use   Vaping status: Never Used  Substance and Sexual Activity   Alcohol use: No    Comment: last used 25 years ago    Drug use: Yes    Types: Cocaine    Comment: last used cocaine  and acid 25 yrs ago,none used since   Sexual activity: Not on file  Other Topics Concern   Not on file  Social History Narrative   Not on file   Social Determinants of Health   Financial Resource Strain: Low Risk  (07/02/2023)   Overall Financial Resource Strain (CARDIA)    Difficulty of Paying Living Expenses: Not hard at all  Food Insecurity: No Food Insecurity (07/02/2023)   Hunger Vital Sign    Worried About Running Out of Food in the Last Year: Never true    Ran Out of Food in the Last Year: Never true  Transportation Needs: No Transportation Needs (07/02/2023)   PRAPARE - Administrator, Civil Service (Medical): No    Lack of Transportation (Non-Medical): No  Physical Activity: Insufficiently Active (07/02/2023)   Exercise Vital Sign    Days of Exercise per Week: 4 days    Minutes of Exercise per Session: 20 min  Stress: No Stress Concern Present (07/02/2023)   [employer] of Occupational Health - Occupational Stress Questionnaire    Feeling of Stress : Not at all  Social Connections: Moderately Isolated (07/02/2023)   Social Connection and Isolation Panel [NHANES]    Frequency of Communication with Friends and Family: Twice a week    Frequency of Social Gatherings with Friends and Family: Once a week    Attends Religious Services: Never    Database administrator or Organizations: No    Attends Banker Meetings: Never    Marital Status: Married  Catering manager Violence: Not At Risk (07/02/2023)   Humiliation, Afraid, Rape, and Kick questionnaire    Fear of Current or Ex-Partner: No    Emotionally Abused: No    Physically Abused: No    Sexually Abused: No    Review of Systems Per HPI  Objective:    Vitals:   07/02/23 1014  BP: 110/68  Pulse: 82  Temp: 98.4 F (36.9 C)  SpO2: 97%  Weight: 225 lb 6 oz (102.2 kg)  Height: 5\' 11"  (1.803 m)     Physical Exam Vitals reviewed.  Constitutional:  General: He is not in acute distress.    Appearance: Normal appearance. He is well-developed.  HENT:     Head: Normocephalic and atraumatic.     Mouth/Throat:     Mouth: Mucous membranes are moist.     Pharynx: Oropharynx is clear. No oropharyngeal exudate or posterior oropharyngeal erythema.  Cardiovascular:     Rate and Rhythm: Normal rate.  Pulmonary:     Effort: Pulmonary effort is normal.  Abdominal:     General: Abdomen is flat. There is no distension.     Tenderness: There is no abdominal tenderness.  Neurological:     Mental Status: He is alert and oriented to person, place, and time.  Psychiatric:        Mood and Affect: Mood normal.        Assessment & Plan:  [name] is a 72 y.o. male . Thrush  -Resolved.  If symptoms recur would consider meeting with either infectious disease or possibly allergy/immunology to determine reason for recurrent thrush.  RTC precautions given.  Diastasis recti  -No sign of hernia on CT as above, suspected diastasis recti, condition discussed, minimal symptoms.  Continue to monitor for now.  No orders of the defined types were placed in this encounter.  Patient Instructions  Exam is reassuring today.  If the rash returns, follow-up with me we can discuss next step or possible specialty eval to figure out why you continue to have that infection.  No changes for now.  CT scan was overall reassuring, diastasis recti is likely the cause of your symptoms but I do not think any surgery or specific treatment is needed at this time.  Let me know if there are questions.  Recheck in 2 months to review your chronic medications and labs. Take care!    Signed,   Meredith Staggers, MD [facility], [facility] 07/02/23 10:54 AM

## 2023-07-02 NOTE — Addendum Note (Signed)
Addended by: Meredith Staggers R on: 07/02/2023 10:56 AM   Modules accepted: Orders

## 2023-07-09 ENCOUNTER — Ambulatory Visit: Payer: 59 | Admitting: Dermatology

## 2023-07-09 ENCOUNTER — Encounter: Payer: Self-pay | Admitting: Dermatology

## 2023-07-09 DIAGNOSIS — L720 Epidermal cyst: Secondary | ICD-10-CM | POA: Diagnosis not present

## 2023-07-09 DIAGNOSIS — L729 Follicular cyst of the skin and subcutaneous tissue, unspecified: Secondary | ICD-10-CM

## 2023-07-09 DIAGNOSIS — Z87828 Personal history of other (healed) physical injury and trauma: Secondary | ICD-10-CM | POA: Diagnosis not present

## 2023-07-09 DIAGNOSIS — D171 Benign lipomatous neoplasm of skin and subcutaneous tissue of trunk: Secondary | ICD-10-CM | POA: Diagnosis not present

## 2023-07-09 DIAGNOSIS — W57XXXA Bitten or stung by nonvenomous insect and other nonvenomous arthropods, initial encounter: Secondary | ICD-10-CM

## 2023-07-09 MED ORDER — CLOBETASOL PROPIONATE 0.05 % EX CREA: TOPICAL_CREAM | Freq: Two times a day (BID) | CUTANEOUS | 0 refills | Status: DC

## 2023-07-09 NOTE — Progress Notes (Signed)
    New Patient Visit   Subjective  [name] is a 72 y.o. male who presents for the following: Cyst  The patient has spots, moles and lesions to be evaluated, some may be new or changing and the patient may have concern these could be cancer.  Previous patient of Dr. Jorja Loa. There is a cyst on patients chest and a nodule on back. Dr. Jorja Loa was monitoring. Denies pain. Cyst on chest will drain sometimes. Patient drains it himself about every three weeks  The following portions of the chart were reviewed this encounter and updated as appropriate: medications, allergies, medical history  Review of Systems:  No other skin or systemic complaints except as noted in HPI or Assessment and Plan.  Objective  Well appearing patient in no apparent distress; mood and affect are within normal limits.  A focused examination was performed of the following areas: Chest and back  Relevant exam findings are noted in the Assessment and Plan.    Assessment & Plan   EPIDERMAL INCLUSION CYST Exam: Subcutaneous nodule in medial chest 2cmx1cm  Benign-appearing. Exam most consistent with an epidermal inclusion cyst. Discussed that a cyst is a benign growth that can grow over time and sometimes get irritated or inflamed. Recommend observation if it is not bothersome. Discussed option of surgical excision to remove it if it is growing, symptomatic, or other changes noted. Please call for new or changing lesions so they can be evaluated.  Lipoma  Exam: Soft subcutaneous 5cm nodule-non tender Location: Mid upper back  Benign-appearing. Exam most consistent with a lipoma. Discussed that a lipoma is a benign fatty growth that can grow over time and sometimes get irritated. Recommend observation if it is not bothersome or changing. Discussed option of ILK injections or surgical excision to remove it if it is growing, symptomatic, or other changes noted. Please call for new or changing lesions so they can be  evaluated.   History of bug bites  Exam: No active lesions. Refilling Clobetasol to use for future insect bites  Treatment Plan: Clobetasol 0.05% ointment-Apply BID as needed for no longer than two weeks    No follow-ups on file.  I, Germaine Pomfret, CMA, am acting as scribe for [employer], DO.   Documentation: I have reviewed the above documentation for accuracy and completeness, and I agree with the above.  Langston Reusing, DO

## 2023-07-09 NOTE — Patient Instructions (Signed)
Hello [name],  Thank you for visiting our office today. Your dedication to addressing your dermatological concerns and maintaining your health is greatly appreciated. 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   [name]: Both conditions are benign. However, the cyst may require surgical removal to prevent recurrence, as it tends to be more symptomatic.  - Surgical Options:   - Procedure: Should you opt for surgery, Dr. Rosanne Gutting is prepared to perform the procedure under local anesthesia with lidocaine. This will involve excision and suturing, followed by sending the excised material to the lab for confirmation.   - Risks: Be aware of potential risks such as scarring and the possibility of keloid formation. Keloids can be treated with injections if they occur.  - Photographic Documentation:   - Purpose: Photographs and measurements of both the lipoma and the cyst have been taken. This documentation is intended to obviate the need for another consultation should you decide to proceed with surgery.  Clobetasol Refill:  -Apply to bug bites when they occur.  Be sure not to use for more than 2 weeks straight and do not apply to the face .  Please take your time to consider the surgical options available for treating the cyst and lipoma. We are here to support you in whatever decision you make. Should you have any questions or require further clarification on the discussed treatment options, do not hesitate to contact our office.  Warm regards,  Dr. [facility]   Important Information  Due to recent changes in healthcare laws, you may see results of your pathology and/or laboratory studies on MyChart before the doctors have had a chance to review them. We  understand that in some cases there may be results that are confusing or concerning to you. Please understand that not all results are received at the same time and often the doctors may need to interpret multiple results in order to provide you with the best plan of care or course of treatment. Therefore, we ask that you please give [place] 2 business days to thoroughly review all your results before contacting the office for clarification. Should we see a critical lab result, you will be contacted sooner.   If You Need Anything After Your Visit  If you have any questions or concerns for your doctor, please call our main line at [phone] If no one answers, please leave a voicemail as directed and we will return your call as soon as possible. Messages left after 4 pm will be answered the following business day.   You may also send [place] a message via MyChart. We typically respond to MyChart messages within 1-2 business days.  For prescription refills, please ask your pharmacy to contact our office. Our fax number is [phone].  If you have an urgent issue when the clinic is closed that cannot wait until the next business day, you can page your doctor at the number below.    Please note that while we do our best to be available for urgent issues outside of office hours, we are not available 24/7.   If you have an urgent issue and are unable to reach [place], you may choose to seek medical care at your doctor's office, retail clinic, urgent care center,  or emergency room.  If you have a medical emergency, please immediately call 911 or go to the emergency department. In the event of inclement weather, please call our main line at [phone] for an update on the status of any delays or closures.  Dermatology Medication Tips: Please keep the boxes that topical medications come in in order to help keep track of the instructions about where and how to use these. Pharmacies typically print the medication  instructions only on the boxes and not directly on the medication tubes.   If your medication is too expensive, please contact our office at [phone] or send [place] a message through MyChart.   We are unable to tell what your co-pay for medications will be in advance as this is different depending on your insurance coverage. However, we may be able to find a substitute medication at lower cost or fill out paperwork to get insurance to cover a needed medication.   If a prior authorization is required to get your medication covered by your insurance company, please allow [place] 1-2 business days to complete this process.  Drug prices often vary depending on where the prescription is filled and some pharmacies may offer cheaper prices.  The website www.goodrx.com contains coupons for medications through different pharmacies. The prices here do not account for what the cost may be with help from insurance (it may be cheaper with your insurance), but the website can give you the price if you did not use any insurance.  - You can print the associated coupon and take it with your prescription to the pharmacy.  - You may also stop by our office during regular business hours and pick up a GoodRx coupon card.  - If you need your prescription sent electronically to a different pharmacy, notify our office through [facility] or by phone at [phone]

## 2023-07-16 ENCOUNTER — Telehealth: Payer: Self-pay

## 2023-07-16 DIAGNOSIS — B370 Candidal stomatitis: Secondary | ICD-10-CM

## 2023-07-16 MED ORDER — FLUCONAZOLE 150 MG PO TABS: 150.0000 mg | ORAL_TABLET | Freq: Every day | ORAL | 0 refills | Status: DC

## 2023-07-16 NOTE — Telephone Encounter (Signed)
Called and LM to have him call back and make an appt.

## 2023-07-16 NOTE — Telephone Encounter (Signed)
Patient called in and notes thrush has returned, started coming back on Saturday and has gotten worse since then. Pt notes did have thrush a week or two ago pt unsure what to do notes he has had it 4 times this year

## 2023-07-16 NOTE — Addendum Note (Signed)
Addended by: Meredith Staggers R on: 07/16/2023 12:44 PM   Modules accepted: Orders

## 2023-07-16 NOTE — Telephone Encounter (Signed)
Restart Diflucan, office visit with me in the next few days so I can examine him.  Same-day appointment is fine.  At that time we can discuss infectious disease or other follow-up given recurrence of symptoms.  We can also discuss if any other testing needed.

## 2023-07-17 NOTE — Telephone Encounter (Signed)
Patient called back and spoke with Erie Noe, got an appt for 07/19/23

## 2023-07-19 ENCOUNTER — Encounter: Payer: Self-pay | Admitting: Family Medicine

## 2023-07-19 ENCOUNTER — Ambulatory Visit: Payer: 59 | Admitting: Family Medicine

## 2023-07-19 VITALS — BP 112/66 | HR 84 | Temp 98.7°F | Wt 225.0 lb

## 2023-07-19 DIAGNOSIS — B370 Candidal stomatitis: Secondary | ICD-10-CM | POA: Diagnosis not present

## 2023-07-19 NOTE — Patient Instructions (Signed)
Glad to hear that the recent thrush flare has improved.  However with the multiple flares of thrush, recurrent thrush I think it would be reasonable to meet with infectious disease to see if they recommend other testing.  I am checking some blood work today including inflammation test, blood count HIV test that they will likely want to see first.  [name] is a possible side effect from your inhaler, so can discuss that with your pulmonologist as well.  If any return of symptoms after completion of this antifungal course, let me know.  Hang in there

## 2023-07-19 NOTE — Progress Notes (Signed)
Subjective:  Patient ID: [name], male    DOB: Jul 18, 1951  Age: 72 y.o. MRN: [id number]  CC:  Chief Complaint  Patient presents with   Follow-up    Med check and thrush is back     HPI [name] presents for   Thrush Diagnosed in September, history of COPD, was using inhalers but not on a steroid inhaler.  Typically noticed burning sensation in his mouth followed by white patches, unrelieved with mouthwash.  He had temporarily tried stopping his Stiolto due to thrush previously and then restarted September 30 visit due to flare of wheeze.  He denied any odynophagia or dysphagia and similar symptoms in the past that improved with use of Diflucan 300 mg x 1, then 150 mg for 1 week total. He was doing better October 14.  White patches had resolved.  See telephone note from 3 days ago.  Recurrence of thrush symptoms 6 days ago with burning sensation. White patches in mouth, slight irritation in back of throat, able to clear food/fluids/secretions.   Started back on Diflucan 300 mg x 1, then 150 mg daily.  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   Pain in left knee 06/02/2019   Allergic rhinitis 03/12/2017   Thrush 02/27/2017   SUI (stress urinary incontinence), male 03/20/2016   Prostate cancer (HCC) 02/09/2016   Obese 09/08/2015   S/P right TKA 09/06/2015   S/P knee replacement 09/06/2015   COPD, mild (HCC) 07/20/2014   Pulmonary nodules 02/23/2014   Tobacco use disorder, moderate, in sustained remission 02/23/2014   Recurrent right inguinal hernia 10/20/2013   ED (erectile dysfunction) 08/26/2012   Physical exam, annual 11/05/2011   HTN (hypertension) 11/05/2011   GERD (gastroesophageal reflux disease) 11/05/2011   Chronic joint pain 11/05/2011   Past Medical History:  Diagnosis Date   Allergy    seasonal   Arthritis     oa   Asthma    COPD (chronic obstructive pulmonary disease) (HCC)    GERD (gastroesophageal reflux disease)    Hypertension    Prostate cancer (HCC) 01/2016   prostatectomy    Shortness of breath dyspnea    Thrush    Past Surgical History:  Procedure Laterality Date   HERNIA REPAIR  last done 3-4 yrs ago   x 2   INGUINAL HERNIA REPAIR Right 11/20/2013   Procedure: OPEN REPAIR OF RECURRENT RIGHT INGUINAL HERNIA WITH MESH;  Surgeon: Wilmon Arms. Corliss Skains, MD;  Location: [facility];  Service: General;  Laterality: Right;   INSERTION OF MESH Right 11/20/2013   Procedure: INSERTION OF MESH;  Surgeon: Wilmon Arms. Corliss Skains, MD;  Location: [facility];  Service: General;  Laterality: Right;   KNEE SURGERY Left july 2014   mcl and meniscus repair   LYMPHADENECTOMY Bilateral 02/09/2016   Procedure: PELVIC LYMPHADENECTOMY;  Surgeon: Sebastian Ache, MD;  Location: [facility];  Service: Urology;  Laterality: Bilateral;   right knee arthroscopy      ROBOT ASSISTED LAPAROSCOPIC RADICAL PROSTATECTOMY N/A 02/09/2016   Procedure: XI ROBOTIC ASSISTED LAPAROSCOPIC RADICAL PROSTATECTOMY WITH INDOCYANINE GREEN DYE AND ADHESIOLYSIS;  Surgeon: Sebastian Ache, MD;  Location: [facility];  Service: Urology;  Laterality: N/A;   TOTAL KNEE ARTHROPLASTY Right 09/06/2015   Procedure: RIGHT TOTAL KNEE ARTHROPLASTY;  Surgeon: Durene Romans, MD;  Location: [facility];  Service: Orthopedics;  Laterality: Right;  TOTAL KNEE ARTHROPLASTY Left 06/17/2019   Procedure: TOTAL KNEE ARTHROPLASTY;  Surgeon: Durene Romans, MD;  Location: [facility];  Service: Orthopedics;  Laterality: Left;  70 mins   Allergies  Allergen Reactions   Percocet [Oxycodone-Acetaminophen] Other (See Comments)    Nausea-Doesn't work   Amoxicillin Rash    Has patient had a PCN reaction causing immediate rash, facial/tongue/throat swelling, SOB or lightheadedness with hypotension: No Has patient had a PCN reaction causing severe rash involving mucus membranes or skin necrosis: No Has  patient had a PCN reaction that required hospitalization No Has patient had a PCN reaction occurring within the last 10 years: Yes- 2016 If all of the above answers are "NO", then may proceed with Cephalosporin use.    Prior to Admission medications   Medication Sig Start Date End Date Taking? Authorizing Provider  albuterol (PROVENTIL HFA) 108 (90 Base) MCG/ACT inhaler Proventil HFA 90 mcg/actuation aerosol inhaler   2 puffs by inhalation route every 4-6 hours as needed. 12/14/22   Leslye Peer, MD  amLODipine (NORVASC) 5 MG tablet TAKE ONE TABLET BY MOUTH ONCE A DAY 06/26/23   Shade Flood, MD  azelastine (OPTIVAR) 0.05 % ophthalmic solution Place 1 drop into both eyes 2 (two) times daily as needed. For itching/allergy symptoms 07/14/19   Shade Flood, MD  Cholecalciferol (VITAMIN D3) 50 MCG (2000 UT) TABS Take 2,000 Units by mouth daily.    [provider]  clobetasol cream (TEMOVATE) 0.05 % Apply topically 2 (two) times daily. Apply topically to affected areas twice a day for no longer than two weeks 07/09/23   Terri Piedra, DO  Clobetasol Prop Emollient Base (CLOBETASOL PROPIONATE E) 0.05 % emollient cream Apply 1 application topically at bedtime. 08/22/21   Janalyn Harder, MD  fluconazole (DIFLUCAN) 150 MG tablet Take 1 tablet (150 mg total) by mouth daily. Initial dose 300mg  po x 1, 07/16/23   Shade Flood, MD  KLS ALLER-FLO 50 MCG/ACT nasal spray INSTILL TWO SPRAYS INTO BOTH NOSTRILS ONCE DAILY 12/05/22   Shade Flood, MD  KLS ESOMEPRAZOLE MAGNESIUM 20 MG capsule TAKE ONE CAPSULE BY MOUTH ONE TIME DAILY 06/26/23   Shade Flood, MD  losartan (COZAAR) 50 MG tablet TAKE ONE TABLET BY MOUTH ONE TIME DAILY 05/10/23   Shade Flood, MD  meloxicam (MOBIC) 15 MG tablet Take 1 tablet (15 mg total) by mouth daily as needed for pain. 09/04/22 09/04/23  Vivi Barrack, DPM  Menthol-Methyl Salicylate (SALONPAS PAIN RELIEF PATCH EX) Place 1 patch onto the skin  daily as needed (pain.).    [provider]  methocarbamol (ROBAXIN) 500 MG tablet Take 500 mg by mouth 3 (three) times daily. 08/02/21   [provider]  pravastatin (PRAVACHOL) 20 MG tablet Take 1 tablet (20 mg total) by mouth daily. 07/24/22   Shade Flood, MD  Tavaborole (KERYDIN) 5 % SOLN Apply 1 drop topically daily. Apply 1 drop to the toenail daily. Patient not taking: Reported on 07/02/2023 06/15/22   Vivi Barrack, DPM  Tiotropium Bromide-Olodaterol (STIOLTO RESPIMAT) 2.5-2.5 MCG/ACT AERS Inhale 2 puffs into the lungs daily. 03/19/23   Cobb, Ruby Cola, NP  triamcinolone cream (KENALOG) 0.1 % Apply 1 application topically 2 (two) times daily as needed. To affected areas only as needed. 01/08/19   Shade Flood, MD  urea (CARMOL) 40 % CREA Apply 1 Application topically daily. 10/31/22   Vivi Barrack, DPM   Social History   Socioeconomic  History   Marital status: Married    Spouse name: Not on file   Number of children: Not on file   Years of education: Not on file   Highest education level: Not on file  Occupational History   Not on file  Tobacco Use   Smoking status: Former    Current packs/day: 0.00    Average packs/day: 1.5 packs/day for 20.0 years (30.0 ttl pk-yrs)    Types: Cigarettes    Start date: 11/05/1979    Quit date: 11/05/1999    Years since quitting: 23.7   Smokeless tobacco: Never  Vaping Use   Vaping status: Never Used  Substance and Sexual Activity   Alcohol use: No    Comment: last used 25 years ago    Drug use: Yes    Types: Cocaine    Comment: last used cocaine  and acid 25 yrs ago,none used since   Sexual activity: Not on file  Other Topics Concern   Not on file  Social History Narrative   Not on file   Social Determinants of Health   Financial Resource Strain: Low Risk  (07/02/2023)   Overall Financial Resource Strain (CARDIA)    Difficulty of Paying Living Expenses: Not hard at all  Food Insecurity: No Food  Insecurity (07/02/2023)   Hunger Vital Sign    Worried About Running Out of Food in the Last Year: Never true    Ran Out of Food in the Last Year: Never true  Transportation Needs: No Transportation Needs (07/02/2023)   PRAPARE - Administrator, Civil Service (Medical): No    Lack of Transportation (Non-Medical): No  Physical Activity: Insufficiently Active (07/02/2023)   Exercise Vital Sign    Days of Exercise per Week: 4 days    Minutes of Exercise per Session: 20 min  Stress: No Stress Concern Present (07/02/2023)   [employer] of Occupational Health - Occupational Stress Questionnaire    Feeling of Stress : Not at all  Social Connections: Moderately Isolated (07/02/2023)   Social Connection and Isolation Panel [NHANES]    Frequency of Communication with Friends and Family: Twice a week    Frequency of Social Gatherings with Friends and Family: Once a week    Attends Religious Services: Never    Database administrator or Organizations: No    Attends Banker Meetings: Never    Marital Status: Married  Catering manager Violence: Not At Risk (07/02/2023)   Humiliation, Afraid, Rape, and Kick questionnaire    Fear of Current or Ex-Partner: No    Emotionally Abused: No    Physically Abused: No    Sexually Abused: No    Review of Systems Per HPI  Objective:   Vitals:   07/19/23 1605  BP: 112/66  Pulse: 84  Temp: 98.7 F (37.1 C)  TempSrc: Temporal  SpO2: 94%  Weight: 225 lb (102.1 kg)     Physical Exam Constitutional:      General: He is not in acute distress.    Appearance: Normal appearance. He is well-developed.  HENT:     Head: Normocephalic and atraumatic.     Mouth/Throat:     Mouth: Mucous membranes are moist.     Comments: Few faint white linear areas on tongue but no buccal mucosa patches or posterior oropharynx involvement.  Clearing secretions without difficulty. Cardiovascular:     Rate and Rhythm: Normal rate.   Pulmonary:     Effort: Pulmonary effort is normal.  Neurological:     Mental Status: He is alert and oriented to person, place, and time.  Psychiatric:        Mood and Affect: Mood normal.          Assessment & Plan:  [name] is a 72 y.o. male . Thrush - Plan: CBC, HIV Antibody (routine testing w rflx), Sedimentation rate, Ambulatory referral to Infectious Disease  -Recurrent thrush.  He is on Stiolto inhaler, and although not an inhaled corticosteroid, oral candidiasis is still listed as a possible reaction.  This still could be primary reason for his recurrent thrush but will refer to infectious disease to see if other testing or evaluation recommended given recurrence.  Improving with recent treatment.  Check CBC, HIV, sed rate initially as above with RTC precautions.  No orders of the defined types were placed in this encounter.  Patient Instructions  Glad to hear that the recent thrush flare has improved.  However with the multiple flares of thrush, recurrent thrush I think it would be reasonable to meet with infectious disease to see if they recommend other testing.  I am checking some blood work today including inflammation test, blood count HIV test that they will likely want to see first.  Ginette Pitman is a possible side effect from your inhaler, so can discuss that with your pulmonologist as well.  If any return of symptoms after completion of this antifungal course, let me know.  Hang in there    Signed,   Meredith Staggers, MD [facility], [facility] 07/19/23 4:29 PM

## 2023-07-20 LAB — CBC
HCT: 46.6 % (ref 39.0–52.0)
Hemoglobin: 15.1 g/dL (ref 13.0–17.0)
MCHC: 32.4 g/dL (ref 30.0–36.0)
MCV: 90.9 fL (ref 78.0–100.0)
Platelets: 273 10*3/uL (ref 150.0–400.0)
RBC: 5.13 Mil/uL (ref 4.22–5.81)
RDW: 13.2 % (ref 11.5–15.5)
WBC: 6.5 10*3/uL (ref 4.0–10.5)

## 2023-07-20 LAB — HIV ANTIBODY (ROUTINE TESTING W REFLEX): HIV 1&2 Ab, 4th Generation: NONREACTIVE

## 2023-07-20 LAB — SEDIMENTATION RATE: Sed Rate: 8 mm/h (ref 0–20)

## 2023-07-23 ENCOUNTER — Telehealth: Payer: Self-pay | Admitting: Emergency Medicine

## 2023-07-23 NOTE — Telephone Encounter (Signed)
PT went to PCP and he told him to see Dr. Leonard Schwartz for thrush. 5th incident this year. Please call to advise @ [phone]. No appts avail.

## 2023-07-23 NOTE — Telephone Encounter (Signed)
Called and spoke to patient.  He wanted to make Dr. Delton Coombes aware that he has been treated for thrush 5x this year.  He completed Diflucan today that was prescribed by PCP. Sx have resolved.   PCP recommended that he reach out to Dr. Delton Coombes.  He is rinsing his mouth out after each use.  Dr. Delton Coombes, please advise. Thanks

## 2023-07-24 NOTE — Telephone Encounter (Signed)
He has had difficulty with his LABA/LAMA with tongue and mouth irritation.  Unclear whether this is always been thrush or why continues to occur since he is not on ICS.  I can try changing him to an alternative LABA/LAMA if he is willing to try a new inhaler.  It may be beneficial for him to come in to review with either APP or RB to discuss further.

## 2023-07-26 ENCOUNTER — Ambulatory Visit: Payer: 59 | Admitting: Emergency Medicine

## 2023-07-26 ENCOUNTER — Encounter: Payer: Self-pay | Admitting: Emergency Medicine

## 2023-07-26 VITALS — BP 110/70 | HR 92 | Temp 98.1°F | Ht 71.0 in | Wt 227.2 lb

## 2023-07-26 DIAGNOSIS — B370 Candidal stomatitis: Secondary | ICD-10-CM | POA: Diagnosis not present

## 2023-07-26 DIAGNOSIS — J449 Chronic obstructive pulmonary disease, unspecified: Secondary | ICD-10-CM

## 2023-07-26 NOTE — Telephone Encounter (Signed)
Lm x1 for patient.  

## 2023-07-26 NOTE — Assessment & Plan Note (Signed)
He may be able to tolerate being off scheduled BD although I do think he has gotten some benefit.  The side effects may outweigh these benefits.  If he no longer has a tongue irritation off the Stiolto, does have dyspnea then we could consider nebulized Yupelri or some alternative.

## 2023-07-26 NOTE — Patient Instructions (Signed)
We will do a trial off of Stiolto to see if this helps with your tongue and mouth irritation and burning. Okay to continue to use albuterol 2 puffs if you needed for shortness of breath, chest tightness, wheezing. If you notice that your breathing worsens and you missed the Stiolto then we can look at alternatives that might include scheduled nebulizer medication Agree with evaluation by the infectious diseases clinic If you continue to have tongue and mouth burning off of the Stiolto then we may need to send you to see the ENT physicians to evaluate further. Follow with Dr. Delton Coombes in 2 months

## 2023-07-26 NOTE — Telephone Encounter (Signed)
Patient was seen in the office today by Dr. Delton Coombes and issue was addressed.  Nothing further needed.

## 2023-07-26 NOTE — Progress Notes (Signed)
Subjective:    Patient ID: [name], male    DOB: 22-Aug-1951, 72 y.o.   MRN: [id number]  HPI  ROV 07/26/2023 -[name] is 45 with COPD and history of pulmonary nodules that were deemed benign based on serial follow-up CTs.  He also has chronic cough with GERD and allergic rhinitis.  Has been having difficulty with his BD due to tongue burning and stinging, not always associated with white plaquing.  He has been treated for possible thrush multiple times in the last year.  He is currently on Stiolto. He is going to see ID to insure there is not another cause for recurrent thrush. He is still having almost continuous mouth irritation, not always associated with white plaques.    Review of Systems  Constitutional:  Negative for fever and unexpected weight change.  HENT:  Negative for congestion, dental problem, ear pain, nosebleeds, postnasal drip, rhinorrhea, sinus pressure, sneezing, sore throat and trouble swallowing.   Eyes:  Negative for redness and itching.  Respiratory:  Negative for chest tightness, shortness of breath and wheezing.   Cardiovascular:  Negative for palpitations and leg swelling.       Knees   Gastrointestinal:  Negative for nausea and vomiting.  Genitourinary:  Negative for dysuria.  Musculoskeletal:  Negative for joint swelling.  Skin:  Negative for rash.  Neurological:  Negative for headaches.  Hematological:  Does not bruise/bleed easily.  Psychiatric/Behavioral:  Negative for dysphoric mood. The patient is not nervous/anxious.        Objective:   Physical Exam Vitals:   07/26/23 0944  BP: 110/70  Pulse: 92  Temp: 98.1 F (36.7 C)  TempSrc: Oral  SpO2: 98%  Weight: 227 lb 3.2 oz (103.1 kg)  Height: 5\' 11"  (1.803 m)    Gen: Pleasant, well-nourished, in no distress,  normal affect  ENT: No lesions,  mouth clear, he has some white areas on the tongue without any overt plaques.  Pharynx looks clear  Neck: No JVD, no stridor  Lungs: No use of  accessory muscles, clear without rales or rhonchi  Cardiovascular: RRR, heart sounds normal, no murmur or gallops, no peripheral edema  Musculoskeletal: No deformities, no cyanosis or clubbing  Neuro: alert, non focal  Skin: Warm, no lesions or rash       Assessment & Plan:  Thrush Not clear to me that this is thrush although he does have some whiteness on his tongue.  The Stiolto does not have an ICS.  Question whether it is just irritation from the medication.  I will stop it to see if he improves  We will do a trial off of Stiolto to see if this helps with your tongue and mouth irritation and burning. Okay to continue to use albuterol 2 puffs if you needed for shortness of breath, chest tightness, wheezing. If you notice that your breathing worsens and you missed the Stiolto then we can look at alternatives that might include scheduled nebulizer medication Agree with evaluation by the infectious diseases clinic If you continue to have tongue and mouth burning off of the Stiolto then we may need to send you to see the ENT physicians to evaluate further. Follow with Dr. Delton Coombes in 2 months  COPD, mild He may be able to tolerate being off scheduled BD although I do think he has gotten some benefit.  The side effects may outweigh these benefits.  If he no longer has a tongue irritation off the Stiolto, does have dyspnea  then we could consider nebulized Yupelri or some alternative.    Levy Pupa, MD, PhD 07/26/2023, 10:07 AM [facility] [phone] or if no answer [phone]

## 2023-07-26 NOTE — Assessment & Plan Note (Signed)
Not clear to me that this is thrush although he does have some whiteness on his tongue.  The Stiolto does not have an ICS.  Question whether it is just irritation from the medication.  I will stop it to see if he improves  We will do a trial off of Stiolto to see if this helps with your tongue and mouth irritation and burning. Okay to continue to use albuterol 2 puffs if you needed for shortness of breath, chest tightness, wheezing. If you notice that your breathing worsens and you missed the Stiolto then we can look at alternatives that might include scheduled nebulizer medication Agree with evaluation by the infectious diseases clinic If you continue to have tongue and mouth burning off of the Stiolto then we may need to send you to see the ENT physicians to evaluate further. Follow with Dr. Delton Coombes in 2 months

## 2023-07-30 ENCOUNTER — Other Ambulatory Visit: Payer: Self-pay | Admitting: Family Medicine

## 2023-07-30 ENCOUNTER — Ambulatory Visit: Payer: 59 | Admitting: Podiatry

## 2023-07-30 DIAGNOSIS — J309 Allergic rhinitis, unspecified: Secondary | ICD-10-CM

## 2023-08-06 ENCOUNTER — Ambulatory Visit: Payer: 59 | Admitting: Podiatry

## 2023-08-07 ENCOUNTER — Emergency Department (HOSPITAL_COMMUNITY)
Admission: EM | Admit: 2023-08-07 | Discharge: 2023-08-07 | Disposition: A | Discharge status: Home / Self Care | Payer: 59 | Attending: Emergency Medicine | Admitting: Emergency Medicine

## 2023-08-07 ENCOUNTER — Emergency Department (HOSPITAL_COMMUNITY): Payer: 59

## 2023-08-07 ENCOUNTER — Encounter (HOSPITAL_COMMUNITY): Payer: Self-pay | Admitting: Emergency Medicine

## 2023-08-07 ENCOUNTER — Other Ambulatory Visit: Payer: Self-pay

## 2023-08-07 ENCOUNTER — Telehealth: Payer: Self-pay

## 2023-08-07 ENCOUNTER — Telehealth: Payer: Self-pay | Admitting: Family Medicine

## 2023-08-07 DIAGNOSIS — J181 Lobar pneumonia, unspecified organism: Secondary | ICD-10-CM | POA: Insufficient documentation

## 2023-08-07 DIAGNOSIS — R509 Fever, unspecified: Secondary | ICD-10-CM | POA: Diagnosis present

## 2023-08-07 DIAGNOSIS — J449 Chronic obstructive pulmonary disease, unspecified: Secondary | ICD-10-CM | POA: Diagnosis not present

## 2023-08-07 DIAGNOSIS — J189 Pneumonia, unspecified organism: Secondary | ICD-10-CM

## 2023-08-07 MED ORDER — DOXYCYCLINE HYCLATE 100 MG PO TABS
100.0000 mg | ORAL_TABLET | Freq: Once | ORAL | Status: AC
  Administered 2023-08-07: 100 mg via ORAL
  Filled 2023-08-07: qty 1

## 2023-08-07 MED ORDER — DOXYCYCLINE HYCLATE 100 MG PO CAPS: 100.0000 mg | ORAL_CAPSULE | Freq: Two times a day (BID) | ORAL | 0 refills | Status: DC

## 2023-08-07 MED ORDER — DEXAMETHASONE 4 MG PO TABS
10.0000 mg | ORAL_TABLET | Freq: Once | ORAL | Status: AC
  Administered 2023-08-07: 10 mg via ORAL
  Filled 2023-08-07: qty 1

## 2023-08-07 NOTE — Telephone Encounter (Signed)
Noted  

## 2023-08-07 NOTE — ED Provider Notes (Signed)
[place] EMERGENCY DEPARTMENT AT [facility] Provider Note   CSN: [id number] Arrival date & time: 08/07/23  4098     History  Chief Complaint  Patient presents with   Fever   Cough    [name] is a 72 y.o. male.  72 yo M with a chief complaints of chest pain with coughing.  The patient is been sick for a little bit over a week.  His significant other feels like he is gotten worse.  Still having subjective fevers and chills at home.  Headache.  He called his family doctor today and they encouraged him to come to the ED for evaluation.  He has a history of COPD.  Has been off his maintenance inhaler due to recurrent thrush.   Fever Associated symptoms: cough   Cough Associated symptoms: fever        Home Medications Prior to Admission medications   Medication Sig Start Date End Date Taking? Authorizing Provider  doxycycline (VIBRAMYCIN) 100 MG capsule Take 1 capsule (100 mg total) by mouth 2 (two) times daily. One po bid x 7 days 08/07/23  Yes Melene Plan, DO  albuterol (PROVENTIL HFA) 108 (90 Base) MCG/ACT inhaler Proventil HFA 90 mcg/actuation aerosol inhaler   2 puffs by inhalation route every 4-6 hours as needed. 12/14/22   Leslye Peer, MD  amLODipine (NORVASC) 5 MG tablet TAKE ONE TABLET BY MOUTH ONCE A DAY 06/26/23   Shade Flood, MD  azelastine (OPTIVAR) 0.05 % ophthalmic solution Place 1 drop into both eyes 2 (two) times daily as needed. For itching/allergy symptoms 07/14/19   Shade Flood, MD  Cholecalciferol (VITAMIN D3) 50 MCG (2000 UT) TABS Take 2,000 Units by mouth daily.    [provider]  clobetasol cream (TEMOVATE) 0.05 % Apply topically 2 (two) times daily. Apply topically to affected areas twice a day for no longer than two weeks 07/09/23   Terri Piedra, DO  Clobetasol Prop Emollient Base (CLOBETASOL PROPIONATE E) 0.05 % emollient cream Apply 1 application topically at bedtime. 08/22/21   Janalyn Harder, MD   fluconazole (DIFLUCAN) 150 MG tablet Take 1 tablet (150 mg total) by mouth daily. Initial dose 300mg  po x 1, 07/16/23   Shade Flood, MD  KLS ALLER-FLO 50 MCG/ACT nasal spray INSTILL 2 SPRAYS INTO BOTH NOSTRILS ONCE DAILY 07/30/23   Shade Flood, MD  KLS ESOMEPRAZOLE MAGNESIUM 20 MG capsule TAKE ONE CAPSULE BY MOUTH ONE TIME DAILY 06/26/23   Shade Flood, MD  losartan (COZAAR) 50 MG tablet TAKE ONE TABLET BY MOUTH ONE TIME DAILY 05/10/23   Shade Flood, MD  meloxicam (MOBIC) 15 MG tablet Take 1 tablet (15 mg total) by mouth daily as needed for pain. 09/04/22 09/04/23  Vivi Barrack, DPM  Menthol-Methyl Salicylate (SALONPAS PAIN RELIEF PATCH EX) Place 1 patch onto the skin daily as needed (pain.).    [provider]  methocarbamol (ROBAXIN) 500 MG tablet Take 500 mg by mouth 3 (three) times daily. 08/02/21   [provider]  pravastatin (PRAVACHOL) 20 MG tablet Take 1 tablet (20 mg total) by mouth daily. 07/24/22   Shade Flood, MD  Tavaborole (KERYDIN) 5 % SOLN Apply 1 drop topically daily. Apply 1 drop to the toenail daily. 06/15/22   Vivi Barrack, DPM  Tiotropium Bromide-Olodaterol (STIOLTO RESPIMAT) 2.5-2.5 MCG/ACT AERS Inhale 2 puffs into the lungs daily. 03/19/23   Cobb, Ruby Cola, NP  triamcinolone cream (KENALOG) 0.1 %  Apply 1 application topically 2 (two) times daily as needed. To affected areas only as needed. 01/08/19   Shade Flood, MD  urea (CARMOL) 40 % CREA Apply 1 Application topically daily. 10/31/22   Vivi Barrack, DPM      Allergies    Percocet [oxycodone-acetaminophen] and Amoxicillin    Review of Systems   Review of Systems  Constitutional:  Positive for fever.  Respiratory:  Positive for cough.     Physical Exam Updated Vital Signs BP (!) 138/99   Pulse 94   Temp 98.6 F (37 C) (Oral)   Resp 20   Ht 5\' 11"  (1.803 m)   Wt 103 kg   SpO2 96%   BMI 31.66 kg/m  Physical Exam Vitals and nursing note  reviewed.  Constitutional:      Appearance: He is well-developed.  HENT:     Head: Normocephalic and atraumatic.     Mouth/Throat:     Comments: Swollen turbinates, posterior nasal drip, no noted sinus ttp, tm normal bilaterally.   Eyes:     Pupils: Pupils are equal, round, and reactive to light.  Neck:     Vascular: No JVD.  Cardiovascular:     Rate and Rhythm: Normal rate and regular rhythm.     Heart sounds: No murmur heard.    No friction rub. No gallop.  Pulmonary:     Effort: No respiratory distress.     Breath sounds: No wheezing.  Abdominal:     General: There is no distension.     Tenderness: There is no abdominal tenderness. There is no guarding or rebound.  Musculoskeletal:        General: Normal range of motion.     Cervical back: Normal range of motion and neck supple.  Skin:    Coloration: Skin is not pale.     Findings: No rash.  Neurological:     Mental Status: He is alert and oriented to person, place, and time.  Psychiatric:        Behavior: Behavior normal.     ED Results / Procedures / Treatments   Labs (all labs ordered are listed, but only abnormal results are displayed) Labs Reviewed - No data to display  EKG None  Radiology No results found.  Procedures Procedures    Medications Ordered in ED Medications  dexamethasone (DECADRON) tablet 10 mg (10 mg Oral Given 08/07/23 0945)  doxycycline (VIBRA-TABS) tablet 100 mg (100 mg Oral Given 08/07/23 0945)    ED Course/ Medical Decision Making/ A&P                                 Medical Decision Making Amount and/or Complexity of Data Reviewed Radiology: ordered.  Risk Prescription drug management.   72 yo M with a chief complaints of cough congestion fevers chills myalgias going on for about 10 days now.  He is well-appearing and nontoxic.  Clear lung sounds for me.  No tachypnea no hypoxia.  With history of COPD will give a dose of steroids here.  Chest x-ray.  Started on  doxycycline for possible concomitant bacterial illness.  Chest x-ray on my independent interpretation with right lobe infiltrate.  Again I think he is well and it is reasonable to trial outpatient antibiotics.  Will have him follow-up with his PCP in the office.  10:04 AM:  I have discussed the diagnosis/risks/treatment options with the patient and family.  Evaluation and diagnostic testing in the emergency department does not suggest an emergent condition requiring admission or immediate intervention beyond what has been performed at this time.  They will follow up with PCP. We also discussed returning to the ED immediately if new or worsening sx occur. We discussed the sx which are most concerning (e.g., sudden worsening pain, fever, inability to tolerate by mouth) that necessitate immediate return. Medications administered to the patient during their visit and any new prescriptions provided to the patient are listed below.  Medications given during this visit Medications  dexamethasone (DECADRON) tablet 10 mg (10 mg Oral Given 08/07/23 0945)  doxycycline (VIBRA-TABS) tablet 100 mg (100 mg Oral Given 08/07/23 0945)     The patient appears reasonably screen and/or stabilized for discharge and I doubt any other medical condition or other [facility] requiring further screening, evaluation, or treatment in the ED at this time prior to discharge.          Final Clinical Impression(s) / ED Diagnoses Final diagnoses:  Community acquired pneumonia of right middle lobe of lung    Rx / DC Orders ED Discharge Orders          Ordered    doxycycline (VIBRAMYCIN) 100 MG capsule  2 times daily        08/07/23 0953              Melene Plan, DO 08/07/23 1004

## 2023-08-07 NOTE — Transitions of Care (Post Inpatient/ED Visit) (Signed)
08/07/2023  Name: [name] MRN: [id number] DOB: [birth year]  Today's TOC FU Call Status:   Patient's Name and Date of Birth confirmed.  Transition Care Management Follow-up Telephone Call Date of Discharge: 08/07/23 Discharge Facility: [facility]) Type of Discharge: Emergency Department Reason for ED Visit: Respiratory Respiratory Diagnosis: Pnuemonia How have you been since you were released from the hospital?: Same Any questions or concerns?: No  Items Reviewed: Did you receive and understand the discharge instructions provided?: Yes Medications obtained,verified, and reconciled?: Yes (Medications Reviewed) Any new allergies since your discharge?: Yes Dietary orders reviewed?: No Do you have support at home?: Yes People in Home: spouse  Medications Reviewed Today: Medications Reviewed Today     Reviewed by Ester Rink, CMA (Certified Medical Assistant) on 08/07/23 at 1217  Med List Status: <None>   Medication Order Taking? Sig Documenting Provider Last Dose Status Informant  albuterol (PROVENTIL HFA) 108 (90 Base) MCG/ACT inhaler [id number] Yes Proventil HFA 90 mcg/actuation aerosol inhaler   2 puffs by inhalation route every 4-6 hours as needed. Leslye Peer, MD Taking Active   amLODipine (NORVASC) 5 MG tablet [id number] Yes TAKE ONE TABLET BY MOUTH ONCE A DAY Shade Flood, MD Taking Active   azelastine (OPTIVAR) 0.05 % ophthalmic solution [id number] Yes Place 1 drop into both eyes 2 (two) times daily as needed. For itching/allergy symptoms Shade Flood, MD Taking Active   Cholecalciferol (VITAMIN D3) 50 MCG (2000 UT) TABS [id number] Yes Take 2,000 Units by mouth daily. [provider] Taking Active Self  clobetasol cream (TEMOVATE) 0.05 % [id number] Yes Apply topically 2 (two) times daily. Apply topically to affected areas twice a day for no longer than two weeks Terri Piedra, DO Taking Active   Clobetasol Prop Emollient Base  (CLOBETASOL PROPIONATE E) 0.05 % emollient cream [id number] Yes Apply 1 application topically at bedtime. Janalyn Harder, MD Taking Active   doxycycline (VIBRAMYCIN) 100 MG capsule [id number] Yes Take 1 capsule (100 mg total) by mouth 2 (two) times daily. One po bid x 7 days Melene Plan, DO Taking Active   fluconazole (DIFLUCAN) 150 MG tablet [id number] Yes Take 1 tablet (150 mg total) by mouth daily. Initial dose 300mg  po x 1, Shade Flood, MD Taking Active   KLS ALLER-FLO 50 MCG/ACT nasal spray [id number] Yes INSTILL 2 SPRAYS INTO BOTH NOSTRILS ONCE DAILY Shade Flood, MD Taking Active   KLS ESOMEPRAZOLE MAGNESIUM 20 MG capsule [id number] Yes TAKE ONE CAPSULE BY MOUTH ONE TIME DAILY Shade Flood, MD Taking Active   losartan (COZAAR) 50 MG tablet [id number] Yes TAKE ONE TABLET BY MOUTH ONE TIME DAILY Shade Flood, MD Taking Active   meloxicam (MOBIC) 15 MG tablet [id number] Yes Take 1 tablet (15 mg total) by mouth daily as needed for pain. Vivi Barrack, DPM Taking Active   Menthol-Methyl Salicylate [facility] PAIN RELIEF PATCH EX) [id number] Yes Place 1 patch onto the skin daily as needed (pain.). [provider] Taking Active Self  methocarbamol (ROBAXIN) 500 MG tablet [id number] Yes Take 500 mg by mouth 3 (three) times daily. [provider] Taking Active   pravastatin (PRAVACHOL) 20 MG tablet [id number] Yes Take 1 tablet (20 mg total) by mouth daily. Shade Flood, MD Taking Active   Tavaborole [facility]) 5 % SOLN [id number] Yes Apply 1 drop topically daily. Apply 1 drop to the toenail daily. Vivi Barrack, DPM Taking Active   Tiotropium Bromide-Olodaterol (STIOLTO RESPIMAT) 2.5-2.5 MCG/ACT AERS [id number]  Yes Inhale 2 puffs into the lungs daily. Noemi Chapel, NP Taking Active   triamcinolone acetonide (KENALOG) 10 MG/ML injection 10 mg [id number]   Vivi Barrack, DPM  Active   triamcinolone cream (KENALOG) 0.1 % [id number] Yes Apply 1 application  topically 2 (two) times daily as needed. To affected areas only as needed. Shade Flood, MD Taking Active   urea (CARMOL) 40 % CREA [id number] Yes Apply 1 Application topically daily. Vivi Barrack, DPM Taking Active             Home Care and Equipment/Supplies: Were Home Health Services Ordered?: No Any new equipment or medical supplies ordered?: No  Functional Questionnaire: Do you need assistance with bathing/showering or dressing?: No Do you need assistance with meal preparation?: No Do you need assistance with eating?: No Do you have difficulty maintaining continence: No Do you need assistance with getting out of bed/getting out of a chair/moving?: No Do you have difficulty managing or taking your medications?: No  Follow up appointments reviewed: PCP Follow-up appointment confirmed?: Yes Date of PCP follow-up appointment?: 08/20/23 Follow-up Provider: [facility] Follow-up appointment confirmed?: NA Do you need transportation to your follow-up appointment?: No Do you understand care options if your condition(s) worsen?: Yes-patient verbalized understanding    SIGNATURE Jonette Pesa

## 2023-08-07 NOTE — ED Triage Notes (Signed)
Patient arrives ambulatory by POV c/o phlegm and pain in chest, cough and fever since Friday. Reports home covid test negative. C/o shortness of breath. Reports taking tylenol, ibuprofen, and other OTC meds. When asked about sick contacts "everyone at [employer] is sick."

## 2023-08-07 NOTE — Telephone Encounter (Signed)
Patient has arrived at the ED, waiting on further notes

## 2023-08-07 NOTE — Discharge Instructions (Signed)
Please call your doctor today and let them know how you are doing.  Please return for worsening difficulty breathing, confusion or inability eat or drink..  I prescribed you a course of antibiotics.

## 2023-08-07 NOTE — Telephone Encounter (Signed)
Patient Name First: Jlyn Last: Remlinger Gender: Male DOB: April 30, 1951 Age: 72 Y 10 M 26 D Return Phone Number: [phone] (Primary), [phone] (Secondary) Address: City/ State/ Zip: [place]  [address] Client [facility] [place] Night - C Client Site [facility] - Night Provider Meredith Staggers- MD Contact Type Call Who Is Calling Patient / Member / Family / Caregiver Call Type Triage / Clinical Relationship To Patient Self Return Phone Number [phone] (Secondary) Chief Complaint Headache Reason for Call Symptomatic / Request for Health Information Initial Comment Caller states patient is going to the emergency room. He has fevers, headache, chills, coughing up phlegm . Translation No Nurse Assessment Nurse: Corpus, RN, Victorino Dike Date/Time (Eastern Time): 08/07/2023 8:07:32 AM Confirm and document reason for call. If symptomatic, describe symptoms. ---Caller states he has been having headache, chills, body aches, chills and is coughing up phlegm. Temperature 99, temporal. Sx since Friday of last week. Would like appt. Does the patient have any new or worsening symptoms? ---Yes Will a triage be completed? ---Yes Related visit to physician within the last 2 weeks? ---No Does the PT have any chronic conditions? (i.e. diabetes, asthma, this includes High risk factors for pregnancy, etc.) ---Yes List chronic conditions. ---COPD Is this a behavioral health or substance abuse call? ---No Guidelines Guideline Title Affirmed Question Affirmed Notes Nurse Date/Time (Eastern Time) Cough - Acute Productive Chest pain (Exception: MILD central chest pain, present only when coughing.) Corpus, RN, Victorino Dike 08/07/2023 8:11:01 AM PLEASE NOTE: All timestamps contained within this report are represented as [place] Standard Time. CONFIDENTIALTY NOTICE: This fax transmission is intended only for the addressee. It  contains information that is legally privileged, confidential or otherwise protected from use or disclosure. If you are not the intended recipient, you are strictly prohibited from reviewing, disclosing, copying using or disseminating any of this information or taking any action in reliance on or regarding this information. If you have received this fax in error, please notify us immediately by telephone so that we can arrange for its return to [place]. Phone: [phone], Toll-Free: [phone], Fax: [phone] Page: 2 of 2 Call Id: [id number] Disp. Time Lamount Cohen Time) Disposition Final User 08/07/2023 8:17:16 AM Go to ED Now Yes Corpus, RN, Victorino Dike Final Disposition 08/07/2023 8:17:16 AM Go to ED Now Yes Corpus, RN, Lilla Shook Disagree/Comply Comply Caller Understands Yes PreDisposition Call Doctor Care Advice Given Per Guideline GO TO ED NOW: * You need to be seen in the Emergency Department. ANOTHER ADULT SHOULD DRIVE: * It is better and safer if another adult drives instead of you. BRING MEDICINES: * Bring a list of your current medicines when you go to the Emergency Department (ER). * Bring the pill bottles too. This will help the doctor (or NP/PA) to make certain you are taking the right medicines and the right dose. CALL EMS 911 IF: * Severe difficulty breathing occurs * Lips or face turns blue * Passes out or becomes confused. CARE ADVICE given per Cough - Acute Productive (Adult) guideline. * Leave now. Drive carefully. Referrals [facility] - ED  Pt called will update [place] ASAP

## 2023-08-10 ENCOUNTER — Telehealth: Payer: Self-pay | Admitting: Family Medicine

## 2023-08-10 NOTE — Telephone Encounter (Signed)
Letter has been printed and placed up front for pick up and patient has been informed

## 2023-08-10 NOTE — Telephone Encounter (Signed)
Caller name: KINSEY RUPANI  On DPR?: Yes  Call back number: [phone] (mobile)  Provider they see: Shade Flood, MD  Reason for call:   Pt is asking can he have a note to extend returning to work. He still doesn't feel well looking to return Tuesday Nov 26.

## 2023-08-10 NOTE — Telephone Encounter (Signed)
That is fine, he was treated for pneumonia on November 19.  Okay to extend return to work note until next week if he is feeling better by that time.  Should be seen if any new or worsening symptoms.[name]

## 2023-08-10 NOTE — Telephone Encounter (Signed)
Request from patient to push return to work date to 08/14/2023

## 2023-08-13 ENCOUNTER — Ambulatory Visit: Payer: 59 | Admitting: Infectious Disease

## 2023-08-13 NOTE — Progress Notes (Deleted)
For infectious disease consult concern for possible recurrent thrush  Requesting physician Meredith Staggers, MD  Subjective:    Patient ID: [name], male    DOB: October 03, 1950, 72 y.o.   MRN: [id number]  HPI   Past Medical History:  Diagnosis Date   Allergy    seasonal   Arthritis    oa   Asthma    COPD (chronic obstructive pulmonary disease) (HCC)    GERD (gastroesophageal reflux disease)    Hypertension    Prostate cancer (HCC) 01/2016   prostatectomy    Shortness of breath dyspnea    Thrush     Past Surgical History:  Procedure Laterality Date   HERNIA REPAIR  last done 3-4 yrs ago   x 2   INGUINAL HERNIA REPAIR Right 11/20/2013   Procedure: OPEN REPAIR OF RECURRENT RIGHT INGUINAL HERNIA WITH MESH;  Surgeon: Wilmon Arms. Corliss Skains, MD;  Location: [facility];  Service: General;  Laterality: Right;   INSERTION OF MESH Right 11/20/2013   Procedure: INSERTION OF MESH;  Surgeon: Wilmon Arms. Corliss Skains, MD;  Location: [facility];  Service: General;  Laterality: Right;   KNEE SURGERY Left july 2014   mcl and meniscus repair   LYMPHADENECTOMY Bilateral 02/09/2016   Procedure: PELVIC LYMPHADENECTOMY;  Surgeon: Sebastian Ache, MD;  Location: [facility];  Service: Urology;  Laterality: Bilateral;   right knee arthroscopy      ROBOT ASSISTED LAPAROSCOPIC RADICAL PROSTATECTOMY N/A 02/09/2016   Procedure: XI ROBOTIC ASSISTED LAPAROSCOPIC RADICAL PROSTATECTOMY WITH INDOCYANINE GREEN DYE AND ADHESIOLYSIS;  Surgeon: Sebastian Ache, MD;  Location: [facility];  Service: Urology;  Laterality: N/A;   TOTAL KNEE ARTHROPLASTY Right 09/06/2015   Procedure: RIGHT TOTAL KNEE ARTHROPLASTY;  Surgeon: Durene Romans, MD;  Location: [facility];  Service: Orthopedics;  Laterality: Right;   TOTAL KNEE ARTHROPLASTY Left 06/17/2019   Procedure: TOTAL KNEE ARTHROPLASTY;  Surgeon: Durene Romans, MD;  Location: [facility];  Service: Orthopedics;  Laterality: Left;  70 mins    Family History  Problem Relation Age of Onset   Cancer Mother     Cancer Father    Cancer Sister    Arthritis Sister    Cancer Maternal Grandmother    Heart disease Maternal Grandfather    Stroke Maternal Grandfather       Social History   Socioeconomic History   Marital status: Married    Spouse name: Not on file   Number of children: Not on file   Years of education: Not on file   Highest education level: Not on file  Occupational History   Not on file  Tobacco Use   Smoking status: Former    Current packs/day: 0.00    Average packs/day: 1.5 packs/day for 20.0 years (30.0 ttl pk-yrs)    Types: Cigarettes    Start date: 11/05/1979    Quit date: 11/05/1999    Years since quitting: 23.7   Smokeless tobacco: Never  Vaping Use   Vaping status: Never Used  Substance and Sexual Activity   Alcohol use: No    Comment: last used 25 years ago    Drug use: Yes    Types: Cocaine    Comment: last used cocaine  and acid 25 yrs ago,none used since   Sexual activity: Not on file  Other Topics Concern   Not on file  Social History Narrative   Not on file   Social Determinants of Health   Financial Resource Strain: Low Risk  (07/02/2023)   Overall  Financial Resource Strain (CARDIA)    Difficulty of Paying Living Expenses: Not hard at all  Food Insecurity: No Food Insecurity (07/02/2023)   Hunger Vital Sign    Worried About Running Out of Food in the Last Year: Never true    Ran Out of Food in the Last Year: Never true  Transportation Needs: No Transportation Needs (07/02/2023)   PRAPARE - Administrator, Civil Service (Medical): No    Lack of Transportation (Non-Medical): No  Physical Activity: Insufficiently Active (07/02/2023)   Exercise Vital Sign    Days of Exercise per Week: 4 days    Minutes of Exercise per Session: 20 min  Stress: No Stress Concern Present (07/02/2023)   [employer] of Occupational Health - Occupational Stress Questionnaire    Feeling of Stress : Not at all  Social Connections: Moderately  Isolated (07/02/2023)   Social Connection and Isolation Panel [NHANES]    Frequency of Communication with Friends and Family: Twice a week    Frequency of Social Gatherings with Friends and Family: Once a week    Attends Religious Services: Never    Database administrator or Organizations: No    Attends Engineer, structural: Never    Marital Status: Married    Allergies  Allergen Reactions   Percocet [Oxycodone-Acetaminophen] Other (See Comments)    Nausea-Doesn't work   Amoxicillin Rash    Has patient had a PCN reaction causing immediate rash, facial/tongue/throat swelling, SOB or lightheadedness with hypotension: No Has patient had a PCN reaction causing severe rash involving mucus membranes or skin necrosis: No Has patient had a PCN reaction that required hospitalization No Has patient had a PCN reaction occurring within the last 10 years: Yes- 2016 If all of the above answers are "NO", then may proceed with Cephalosporin use.      Current Outpatient Medications:    albuterol (PROVENTIL HFA) 108 (90 Base) MCG/ACT inhaler, Proventil HFA 90 mcg/actuation aerosol inhaler   2 puffs by inhalation route every 4-6 hours as needed., Disp: 18 g, Rfl: 3   amLODipine (NORVASC) 5 MG tablet, TAKE ONE TABLET BY MOUTH ONCE A DAY, Disp: 90 tablet, Rfl: 0   azelastine (OPTIVAR) 0.05 % ophthalmic solution, Place 1 drop into both eyes 2 (two) times daily as needed. For itching/allergy symptoms, Disp: 6 mL, Rfl: 1   Cholecalciferol (VITAMIN D3) 50 MCG (2000 UT) TABS, Take 2,000 Units by mouth daily., Disp: , Rfl:    clobetasol cream (TEMOVATE) 0.05 %, Apply topically 2 (two) times daily. Apply topically to affected areas twice a day for no longer than two weeks, Disp: 60 g, Rfl: 0   Clobetasol Prop Emollient Base (CLOBETASOL PROPIONATE E) 0.05 % emollient cream, Apply 1 application topically at bedtime., Disp: 60 g, Rfl: 11   doxycycline (VIBRAMYCIN) 100 MG capsule, Take 1 capsule (100 mg  total) by mouth 2 (two) times daily. One po bid x 7 days, Disp: 14 capsule, Rfl: 0   fluconazole (DIFLUCAN) 150 MG tablet, Take 1 tablet (150 mg total) by mouth daily. Initial dose 300mg  po x 1,, Disp: 8 tablet, Rfl: 0   KLS ALLER-FLO 50 MCG/ACT nasal spray, INSTILL 2 SPRAYS INTO BOTH NOSTRILS ONCE DAILY, Disp: 91 mL, Rfl: 0   KLS ESOMEPRAZOLE MAGNESIUM 20 MG capsule, TAKE ONE CAPSULE BY MOUTH ONE TIME DAILY, Disp: 84 capsule, Rfl: 0   losartan (COZAAR) 50 MG tablet, TAKE ONE TABLET BY MOUTH ONE TIME DAILY, Disp: 90 tablet, Rfl: 0  meloxicam (MOBIC) 15 MG tablet, Take 1 tablet (15 mg total) by mouth daily as needed for pain., Disp: 30 tablet, Rfl: 0   Menthol-Methyl Salicylate (SALONPAS PAIN RELIEF PATCH EX), Place 1 patch onto the skin daily as needed (pain.)., Disp: , Rfl:    methocarbamol (ROBAXIN) 500 MG tablet, Take 500 mg by mouth 3 (three) times daily., Disp: , Rfl:    pravastatin (PRAVACHOL) 20 MG tablet, Take 1 tablet (20 mg total) by mouth daily., Disp: 90 tablet, Rfl: 3   Tavaborole (KERYDIN) 5 % SOLN, Apply 1 drop topically daily. Apply 1 drop to the toenail daily., Disp: 10 mL, Rfl: 2   Tiotropium Bromide-Olodaterol (STIOLTO RESPIMAT) 2.5-2.5 MCG/ACT AERS, Inhale 2 puffs into the lungs daily., Disp: 4 g, Rfl: 5   triamcinolone cream (KENALOG) 0.1 %, Apply 1 application topically 2 (two) times daily as needed. To affected areas only as needed., Disp: 30 g, Rfl: 0   urea (CARMOL) 40 % CREA, Apply 1 Application topically daily., Disp: 198 g, Rfl: 0  Current Facility-Administered Medications:    triamcinolone acetonide (KENALOG) 10 MG/ML injection 10 mg, 10 mg, Other, Once, Vivi Barrack, DPM   Review of Systems     Objective:   Physical Exam        Assessment & Plan:

## 2023-08-14 ENCOUNTER — Other Ambulatory Visit: Payer: Self-pay | Admitting: Family Medicine

## 2023-08-14 DIAGNOSIS — I10 Essential (primary) hypertension: Secondary | ICD-10-CM

## 2023-08-20 ENCOUNTER — Ambulatory Visit: Payer: 59 | Admitting: Family Medicine

## 2023-08-20 ENCOUNTER — Encounter: Payer: Self-pay | Admitting: Family Medicine

## 2023-08-20 VITALS — BP 134/70 | HR 87 | Temp 99.2°F | Ht 71.0 in | Wt 224.2 lb

## 2023-08-20 DIAGNOSIS — Z8619 Personal history of other infectious and parasitic diseases: Secondary | ICD-10-CM | POA: Diagnosis not present

## 2023-08-20 DIAGNOSIS — B370 Candidal stomatitis: Secondary | ICD-10-CM | POA: Diagnosis not present

## 2023-08-20 DIAGNOSIS — J189 Pneumonia, unspecified organism: Secondary | ICD-10-CM

## 2023-08-20 MED ORDER — FLUCONAZOLE 150 MG PO TABS: 150.0000 mg | ORAL_TABLET | Freq: Every day | ORAL | 0 refills | Status: DC

## 2023-08-20 NOTE — Progress Notes (Signed)
Subjective:  Patient ID: [name], male    DOB: 12/09/1950  Age: 72 y.o. MRN: [id number]  CC:  Chief Complaint  Patient presents with   Hospitalization Follow-up    Pt notes resolved and is feeling much better, no questions     HPI [name] presents for   Hospital/ER follow-up for pneumonia.  ER visit noted from 08/07/2023. Was seen with cough and chest pain.  Symptoms for about a week.  Symptoms had worsening with subjective fevers and chills, headache.  History of COPD but off maintenance inhaler due to recurrent thrush.  Lungs were initially clear on exam.  Dose of steroids were given (dexamethasone) with history of COPD, chest x-ray with possible right lobe infiltrate, treated with doxycycline. Chest x-ray report indicated hazy consolidation of right mid lung compatible with pneumonia with plan for repeat radiograph in 6 to 8 weeks to ensure resolution.  Feeling better. Has completed abx last week. No recent fevers or subjective fever. Eating/drinking ok.  Cough has resolved. No tongue coating or sx's of thrush recently.   Breathing ok - only using rescue inhaler a few times per week. Still off Stiolto.  Appt planned with infectious disease on 12/9 for prior recurrent thrush. No sx's of thrush off Stiolto yet.     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   Pain in left knee 06/02/2019   Allergic rhinitis 03/12/2017   Thrush 02/27/2017   SUI (stress urinary incontinence), male 03/20/2016   Prostate cancer (HCC) 02/09/2016   Obese 09/08/2015   S/P right TKA 09/06/2015   S/P knee replacement 09/06/2015   COPD, mild (HCC) 07/20/2014   Pulmonary nodules 02/23/2014   Tobacco use disorder, moderate, in sustained remission 02/23/2014   Recurrent right inguinal hernia 10/20/2013   ED (erectile dysfunction) 08/26/2012   Physical  exam, annual 11/05/2011   HTN (hypertension) 11/05/2011   GERD (gastroesophageal reflux disease) 11/05/2011   Chronic joint pain 11/05/2011   Past Medical History:  Diagnosis Date   Allergy    seasonal   Arthritis    oa   Asthma    COPD (chronic obstructive pulmonary disease) (HCC)    GERD (gastroesophageal reflux disease)    Hypertension    Prostate cancer (HCC) 01/2016   prostatectomy    Shortness of breath dyspnea    Thrush    Past Surgical History:  Procedure Laterality Date   HERNIA REPAIR  last done 3-4 yrs ago   x 2   INGUINAL HERNIA REPAIR Right 11/20/2013   Procedure: OPEN REPAIR OF RECURRENT RIGHT INGUINAL HERNIA WITH MESH;  Surgeon: Wilmon Arms. Corliss Skains, MD;  Location: [facility];  Service: General;  Laterality: Right;   INSERTION OF MESH Right 11/20/2013   Procedure: INSERTION OF MESH;  Surgeon: Wilmon Arms. Corliss Skains, MD;  Location: [facility];  Service: General;  Laterality: Right;   KNEE SURGERY Left july 2014   mcl and meniscus repair   LYMPHADENECTOMY Bilateral 02/09/2016   Procedure: PELVIC LYMPHADENECTOMY;  Surgeon: Sebastian Ache, MD;  Location: [facility];  Service: Urology;  Laterality: Bilateral;   right knee arthroscopy      ROBOT ASSISTED LAPAROSCOPIC RADICAL PROSTATECTOMY N/A 02/09/2016   Procedure: XI ROBOTIC ASSISTED LAPAROSCOPIC RADICAL PROSTATECTOMY WITH INDOCYANINE GREEN DYE AND ADHESIOLYSIS;  Surgeon: Sebastian Ache, MD;  Location[facility];  Service: Urology;  Laterality: N/A;  TOTAL KNEE ARTHROPLASTY Right 09/06/2015   Procedure: RIGHT TOTAL KNEE ARTHROPLASTY;  Surgeon: Durene Romans, MD;  Location: [facility];  Service: Orthopedics;  Laterality: Right;   TOTAL KNEE ARTHROPLASTY Left 06/17/2019   Procedure: TOTAL KNEE ARTHROPLASTY;  Surgeon: Durene Romans, MD;  Location: [facility];  Service: Orthopedics;  Laterality: Left;  70 mins   Allergies  Allergen Reactions   Percocet [Oxycodone-Acetaminophen] Other (See Comments)    Nausea-Doesn't work   Amoxicillin Rash    Has patient  had a PCN reaction causing immediate rash, facial/tongue/throat swelling, SOB or lightheadedness with hypotension: No Has patient had a PCN reaction causing severe rash involving mucus membranes or skin necrosis: No Has patient had a PCN reaction that required hospitalization No Has patient had a PCN reaction occurring within the last 10 years: Yes- 2016 If all of the above answers are "NO", then may proceed with Cephalosporin use.    Prior to Admission medications   Medication Sig Start Date End Date Taking? Authorizing Provider  albuterol (PROVENTIL HFA) 108 (90 Base) MCG/ACT inhaler Proventil HFA 90 mcg/actuation aerosol inhaler   2 puffs by inhalation route every 4-6 hours as needed. 12/14/22  Yes Leslye Peer, MD  amLODipine (NORVASC) 5 MG tablet TAKE ONE TABLET BY MOUTH ONCE A DAY 06/26/23  Yes Shade Flood, MD  azelastine (OPTIVAR) 0.05 % ophthalmic solution Place 1 drop into both eyes 2 (two) times daily as needed. For itching/allergy symptoms 07/14/19  Yes Shade Flood, MD  Cholecalciferol (VITAMIN D3) 50 MCG (2000 UT) TABS Take 2,000 Units by mouth daily.   Yes [provider]  clobetasol cream (TEMOVATE) 0.05 % Apply topically 2 (two) times daily. Apply topically to affected areas twice a day for no longer than two weeks 07/09/23  Yes Terri Piedra, DO  Clobetasol Prop Emollient Base (CLOBETASOL PROPIONATE E) 0.05 % emollient cream Apply 1 application topically at bedtime. 08/22/21  Yes Janalyn Harder, MD  fluconazole (DIFLUCAN) 150 MG tablet Take 1 tablet (150 mg total) by mouth daily. Initial dose 300mg  po x 1, 07/16/23  Yes Shade Flood, MD  KLS ALLER-FLO 50 MCG/ACT nasal spray INSTILL 2 SPRAYS INTO BOTH NOSTRILS ONCE DAILY 07/30/23  Yes Shade Flood, MD  KLS ESOMEPRAZOLE MAGNESIUM 20 MG capsule TAKE ONE CAPSULE BY MOUTH ONE TIME DAILY 06/26/23  Yes Shade Flood, MD  losartan (COZAAR) 50 MG tablet TAKE ONE TABLET BY MOUTH ONCE A DAY 08/14/23  Yes  Shade Flood, MD  meloxicam (MOBIC) 15 MG tablet Take 1 tablet (15 mg total) by mouth daily as needed for pain. 09/04/22 09/04/23 Yes Vivi Barrack, DPM  Menthol-Methyl Salicylate (SALONPAS PAIN RELIEF PATCH EX) Place 1 patch onto the skin daily as needed (pain.).   Yes [provider]  methocarbamol (ROBAXIN) 500 MG tablet Take 500 mg by mouth 3 (three) times daily. 08/02/21  Yes [provider]  pravastatin (PRAVACHOL) 20 MG tablet Take 1 tablet (20 mg total) by mouth daily. 07/24/22  Yes Shade Flood, MD  Tavaborole (KERYDIN) 5 % SOLN Apply 1 drop topically daily. Apply 1 drop to the toenail daily. 06/15/22  Yes Vivi Barrack, DPM  Tiotropium Bromide-Olodaterol (STIOLTO RESPIMAT) 2.5-2.5 MCG/ACT AERS Inhale 2 puffs into the lungs daily. 03/19/23  Yes Cobb, Ruby Cola, NP  triamcinolone cream (KENALOG) 0.1 % Apply 1 application topically 2 (two) times daily as needed. To affected areas only as needed. 01/08/19  Yes Shade Flood, MD  urea (  CARMOL) 40 % CREA Apply 1 Application topically daily. 10/31/22  Yes Vivi Barrack, DPM  doxycycline (VIBRAMYCIN) 100 MG capsule Take 1 capsule (100 mg total) by mouth 2 (two) times daily. One po bid x 7 days Patient not taking: Reported on 08/20/2023 08/07/23   Melene Plan, DO   Social History   Socioeconomic History   Marital status: Married    Spouse name: Not on file   Number of children: Not on file   Years of education: Not on file   Highest education level: Not on file  Occupational History   Not on file  Tobacco Use   Smoking status: Former    Current packs/day: 0.00    Average packs/day: 1.5 packs/day for 20.0 years (30.0 ttl pk-yrs)    Types: Cigarettes    Start date: 11/05/1979    Quit date: 11/05/1999    Years since quitting: 23.8   Smokeless tobacco: Never  Vaping Use   Vaping status: Never Used  Substance and Sexual Activity   Alcohol use: No    Comment: last used 25 years ago    Drug use:  Yes    Types: Cocaine    Comment: last used cocaine  and acid 25 yrs ago,none used since   Sexual activity: Not on file  Other Topics Concern   Not on file  Social History Narrative   Not on file   Social Determinants of Health   Financial Resource Strain: Low Risk  (07/02/2023)   Overall Financial Resource Strain (CARDIA)    Difficulty of Paying Living Expenses: Not hard at all  Food Insecurity: No Food Insecurity (07/02/2023)   Hunger Vital Sign    Worried About Running Out of Food in the Last Year: Never true    Ran Out of Food in the Last Year: Never true  Transportation Needs: No Transportation Needs (07/02/2023)   PRAPARE - Administrator, Civil Service (Medical): No    Lack of Transportation (Non-Medical): No  Physical Activity: Insufficiently Active (07/02/2023)   Exercise Vital Sign    Days of Exercise per Week: 4 days    Minutes of Exercise per Session: 20 min  Stress: No Stress Concern Present (07/02/2023)   [employer] of Occupational Health - Occupational Stress Questionnaire    Feeling of Stress : Not at all  Social Connections: Moderately Isolated (07/02/2023)   Social Connection and Isolation Panel [NHANES]    Frequency of Communication with Friends and Family: Twice a week    Frequency of Social Gatherings with Friends and Family: Once a week    Attends Religious Services: Never    Database administrator or Organizations: No    Attends Banker Meetings: Never    Marital Status: Married  Catering manager Violence: Not At Risk (07/02/2023)   Humiliation, Afraid, Rape, and Kick questionnaire    Fear of Current or Ex-Partner: No    Emotionally Abused: No    Physically Abused: No    Sexually Abused: No    Review of Systems Per HPI.   Objective:   Vitals:   08/20/23 1054  BP: 134/70  Pulse: 87  Temp: 99.2 F (37.3 C)  TempSrc: Temporal  SpO2: 98%  Weight: 224 lb 3.2 oz (101.7 kg)  Height: 5\' 11"  (1.803 m)      Physical Exam Vitals reviewed.  Constitutional:      Appearance: He is well-developed.  HENT:     Head: Normocephalic and atraumatic.  Mouth/Throat:     Mouth: Mucous membranes are moist.     Comments: Possible faint white coating across tongue but not buccal mucosa.  Patient notes he did drink a protein drink just prior to visit.  Denies any tongue pain, burning or symptoms of thrush at this time. Neck:     Vascular: No carotid bruit or JVD.  Cardiovascular:     Rate and Rhythm: Normal rate and regular rhythm.     Heart sounds: Normal heart sounds. No murmur heard. Pulmonary:     Effort: Pulmonary effort is normal.     Breath sounds: No rales.     Comments: Possible few faint coarse breath sounds right middle lobe but otherwise clear, normal effort, no distress and no audible wheeze.  No stridor. Musculoskeletal:     Right lower leg: No edema.     Left lower leg: No edema.  Skin:    General: Skin is warm and dry.  Neurological:     Mental Status: He is alert and oriented to person, place, and time.  Psychiatric:        Mood and Affect: Mood normal.        Assessment & Plan:  [name] is a 72 y.o. male . History of thrush  Thrush - Plan: fluconazole (DIFLUCAN) 150 MG tablet  Pneumonia of right middle lobe due to infectious organism - Plan: DG Chest 2 View  Right middle lobe pneumonia clinically improved.  Plan for repeat imaging in 1 month.  RTC/ER precautions if new cough, fever, chills or worsening symptoms. History of thrush but has not had recurrence off his Stiolto inhaler.  Plan for infectious disease follow-up next week.  I did give him a printed prescription for Diflucan today given recent oral antibiotic usage to use if recurrence of thrush but hold for now.   Meds ordered this encounter  Medications   fluconazole (DIFLUCAN) 150 MG tablet    Sig: Take 1 tablet (150 mg total) by mouth daily. Initial dose 300mg  po x 1,    Dispense:  8 tablet     Refill:  0   Patient Instructions  Lungs overall sound clear today.  Glad you are feeling better.  We should repeat a chest x-ray in 1 month to make sure that the previous area of concern for infection has completely resolved.  Please set an alarm or remind her to have that test performed, as you will not need an appointment.  Walking to the location below at [employer].  If you have any symptoms of thrush after recent antibiotic, can fill the printed prescription given to you today.  Keep follow-up with infectious disease provider as planned.  Let me know if there are questions and take care!  [facility] or xray: Walk in 8:30-4:30 during weekdays, no appointment needed [address]  [place], [place] [address]     Signed,   Meredith Staggers, MD [facility], [facility] 08/20/23 11:45 AM

## 2023-08-20 NOTE — Patient Instructions (Signed)
Lungs overall sound clear today.  Glad you are feeling better.  We should repeat a chest x-ray in 1 month to make sure that the previous area of concern for infection has completely resolved.  Please set an alarm or remind her to have that test performed, as you will not need an appointment.  Walking to the location below at [employer].  If you have any symptoms of thrush after recent antibiotic, can fill the printed prescription given to you today.  Keep follow-up with infectious disease provider as planned.  Let me know if there are questions and take care!  [facility] or xray: Walk in 8:30-4:30 during weekdays, no appointment needed [address]  [place], [place] [address]

## 2023-08-26 NOTE — Progress Notes (Unsigned)
For infectious disease consult concern for possible recurrent thrush  Requesting physician Meredith Staggers, MD  Subjective:    Patient ID: [name], male    DOB: 06-09-1951, 72 y.o.   MRN: [id number]  HPI  Discussed the use of AI scribe software for clinical note transcription with the patient, who gave verbal consent to proceed.  History of Present Illness   [name], a patient with a history of COPD and prostate cancer, presents for evaluation of recurrent oral pain and potential thrush. He reports having experienced this issue five times within the year, each time coinciding with the use of an inhaler. Initially, he was using a steroid inhaler, and reportedly did respond to treatment with fluconazole. he was  was then switched to [facility], a non-steroid inhaler, due to the recurrent possible thrush.  his symptoms persisted on this inhaler and diagnosis of thrush questioned. He was taken off of the off the inhaler for a month and reports no recurrence of the oral symptoms. He also has a history of prostate cancer which has made being sexually active challenging and he has not been active with his wife or anyone else.        Past Medical History:  Diagnosis Date   Allergy    seasonal   Arthritis    oa   Asthma    COPD (chronic obstructive pulmonary disease) (HCC)    GERD (gastroesophageal reflux disease)    Hypertension    Prostate cancer (HCC) 01/2016   prostatectomy    Shortness of breath dyspnea    Thrush     Past Surgical History:  Procedure Laterality Date   HERNIA REPAIR  last done 3-4 yrs ago   x 2   INGUINAL HERNIA REPAIR Right 11/20/2013   Procedure: OPEN REPAIR OF RECURRENT RIGHT INGUINAL HERNIA WITH MESH;  Surgeon: Wilmon Arms. Corliss Skains, MD;  Location: [facility];  Service: General;  Laterality: Right;   INSERTION OF MESH Right 11/20/2013   Procedure: INSERTION OF MESH;  Surgeon: Wilmon Arms. Corliss Skains, MD;  Location: [facility];  Service: General;  Laterality: Right;    KNEE SURGERY Left july 2014   mcl and meniscus repair   LYMPHADENECTOMY Bilateral 02/09/2016   Procedure: PELVIC LYMPHADENECTOMY;  Surgeon: Sebastian Ache, MD;  Location: [facility];  Service: Urology;  Laterality: Bilateral;   right knee arthroscopy      ROBOT ASSISTED LAPAROSCOPIC RADICAL PROSTATECTOMY N/A 02/09/2016   Procedure: XI ROBOTIC ASSISTED LAPAROSCOPIC RADICAL PROSTATECTOMY WITH INDOCYANINE GREEN DYE AND ADHESIOLYSIS;  Surgeon: Sebastian Ache, MD;  Location: [facility];  Service: Urology;  Laterality: N/A;   TOTAL KNEE ARTHROPLASTY Right 09/06/2015   Procedure: RIGHT TOTAL KNEE ARTHROPLASTY;  Surgeon: Durene Romans, MD;  Location: [facility];  Service: Orthopedics;  Laterality: Right;   TOTAL KNEE ARTHROPLASTY Left 06/17/2019   Procedure: TOTAL KNEE ARTHROPLASTY;  Surgeon: Durene Romans, MD;  Location: [facility];  Service: Orthopedics;  Laterality: Left;  70 mins    Family History  Problem Relation Age of Onset   Cancer Mother    Cancer Father    Cancer Sister    Arthritis Sister    Cancer Maternal Grandmother    Heart disease Maternal Grandfather    Stroke Maternal Grandfather       Social History   Socioeconomic History   Marital status: Married    Spouse name: Not on file   Number of children: Not on file   Years of education: Not on file   Highest  education level: Not on file  Occupational History   Not on file  Tobacco Use   Smoking status: Former    Current packs/day: 0.00    Average packs/day: 1.5 packs/day for 20.0 years (30.0 ttl pk-yrs)    Types: Cigarettes    Start date: 11/05/1979    Quit date: 11/05/1999    Years since quitting: 23.8   Smokeless tobacco: Never  Vaping Use   Vaping status: Never Used  Substance and Sexual Activity   Alcohol use: No    Comment: last used 25 years ago    Drug use: Yes    Types: Cocaine    Comment: last used cocaine  and acid 25 yrs ago,none used since   Sexual activity: Not on file  Other Topics Concern   Not on file  Social  History Narrative   Not on file   Social Determinants of Health   Financial Resource Strain: Low Risk  (07/02/2023)   Overall Financial Resource Strain (CARDIA)    Difficulty of Paying Living Expenses: Not hard at all  Food Insecurity: No Food Insecurity (07/02/2023)   Hunger Vital Sign    Worried About Running Out of Food in the Last Year: Never true    Ran Out of Food in the Last Year: Never true  Transportation Needs: No Transportation Needs (07/02/2023)   PRAPARE - Administrator, Civil Service (Medical): No    Lack of Transportation (Non-Medical): No  Physical Activity: Insufficiently Active (07/02/2023)   Exercise Vital Sign    Days of Exercise per Week: 4 days    Minutes of Exercise per Session: 20 min  Stress: No Stress Concern Present (07/02/2023)   [employer] of Occupational Health - Occupational Stress Questionnaire    Feeling of Stress : Not at all  Social Connections: Moderately Isolated (07/02/2023)   Social Connection and Isolation Panel [NHANES]    Frequency of Communication with Friends and Family: Twice a week    Frequency of Social Gatherings with Friends and Family: Once a week    Attends Religious Services: Never    Database administrator or Organizations: No    Attends Engineer, structural: Never    Marital Status: Married    Allergies  Allergen Reactions   Percocet [Oxycodone-Acetaminophen] Other (See Comments)    Nausea-Doesn't work   Amoxicillin Rash    Has patient had a PCN reaction causing immediate rash, facial/tongue/throat swelling, SOB or lightheadedness with hypotension: No Has patient had a PCN reaction causing severe rash involving mucus membranes or skin necrosis: No Has patient had a PCN reaction that required hospitalization No Has patient had a PCN reaction occurring within the last 10 years: Yes- 2016 If all of the above answers are "NO", then may proceed with Cephalosporin use.      Current Outpatient  Medications:    albuterol (PROVENTIL HFA) 108 (90 Base) MCG/ACT inhaler, Proventil HFA 90 mcg/actuation aerosol inhaler   2 puffs by inhalation route every 4-6 hours as needed., Disp: 18 g, Rfl: 3   amLODipine (NORVASC) 5 MG tablet, TAKE ONE TABLET BY MOUTH ONCE A DAY, Disp: 90 tablet, Rfl: 0   azelastine (OPTIVAR) 0.05 % ophthalmic solution, Place 1 drop into both eyes 2 (two) times daily as needed. For itching/allergy symptoms, Disp: 6 mL, Rfl: 1   Cholecalciferol (VITAMIN D3) 50 MCG (2000 UT) TABS, Take 2,000 Units by mouth daily., Disp: , Rfl:    clobetasol cream (TEMOVATE) 0.05 %, Apply topically  2 (two) times daily. Apply topically to affected areas twice a day for no longer than two weeks, Disp: 60 g, Rfl: 0   Clobetasol Prop Emollient Base (CLOBETASOL PROPIONATE E) 0.05 % emollient cream, Apply 1 application topically at bedtime., Disp: 60 g, Rfl: 11   doxycycline (VIBRAMYCIN) 100 MG capsule, Take 1 capsule (100 mg total) by mouth 2 (two) times daily. One po bid x 7 days (Patient not taking: Reported on 08/20/2023), Disp: 14 capsule, Rfl: 0   fluconazole (DIFLUCAN) 150 MG tablet, Take 1 tablet (150 mg total) by mouth daily. Initial dose 300mg  po x 1,, Disp: 8 tablet, Rfl: 0   KLS ALLER-FLO 50 MCG/ACT nasal spray, INSTILL 2 SPRAYS INTO BOTH NOSTRILS ONCE DAILY, Disp: 91 mL, Rfl: 0   KLS ESOMEPRAZOLE MAGNESIUM 20 MG capsule, TAKE ONE CAPSULE BY MOUTH ONE TIME DAILY, Disp: 84 capsule, Rfl: 0   losartan (COZAAR) 50 MG tablet, TAKE ONE TABLET BY MOUTH ONCE A DAY, Disp: 90 tablet, Rfl: 0   meloxicam (MOBIC) 15 MG tablet, Take 1 tablet (15 mg total) by mouth daily as needed for pain., Disp: 30 tablet, Rfl: 0   Menthol-Methyl Salicylate (SALONPAS PAIN RELIEF PATCH EX), Place 1 patch onto the skin daily as needed (pain.)., Disp: , Rfl:    methocarbamol (ROBAXIN) 500 MG tablet, Take 500 mg by mouth 3 (three) times daily., Disp: , Rfl:    pravastatin (PRAVACHOL) 20 MG tablet, Take 1 tablet (20 mg total)  by mouth daily., Disp: 90 tablet, Rfl: 3   Tavaborole (KERYDIN) 5 % SOLN, Apply 1 drop topically daily. Apply 1 drop to the toenail daily., Disp: 10 mL, Rfl: 2   Tiotropium Bromide-Olodaterol (STIOLTO RESPIMAT) 2.5-2.5 MCG/ACT AERS, Inhale 2 puffs into the lungs daily., Disp: 4 g, Rfl: 5   triamcinolone cream (KENALOG) 0.1 %, Apply 1 application topically 2 (two) times daily as needed. To affected areas only as needed., Disp: 30 g, Rfl: 0   urea (CARMOL) 40 % CREA, Apply 1 Application topically daily., Disp: 198 g, Rfl: 0  Current Facility-Administered Medications:    triamcinolone acetonide (KENALOG) 10 MG/ML injection 10 mg, 10 mg, Other, Once, Vivi Barrack, DPM   Review of Systems  Constitutional:  Negative for activity change, appetite change, chills, diaphoresis, fatigue, fever and unexpected weight change.  HENT:  Negative for congestion, rhinorrhea, sinus pressure, sneezing, sore throat and trouble swallowing.   Eyes:  Negative for photophobia and visual disturbance.  Respiratory:  Negative for cough, chest tightness, shortness of breath, wheezing and stridor.   Cardiovascular:  Negative for chest pain, palpitations and leg swelling.  Gastrointestinal:  Negative for abdominal distention, abdominal pain, anal bleeding, blood in stool, constipation, diarrhea, nausea and vomiting.  Genitourinary:  Negative for difficulty urinating, dysuria, flank pain and hematuria.  Musculoskeletal:  Negative for arthralgias, back pain, gait problem, joint swelling and myalgias.  Skin:  Negative for color change, pallor, rash and wound.  Neurological:  Negative for dizziness, tremors, weakness and light-headedness.  Hematological:  Negative for adenopathy. Does not bruise/bleed easily.  Psychiatric/Behavioral:  Negative for agitation, behavioral problems, confusion, decreased concentration, dysphoric mood and sleep disturbance.        Objective:   Physical Exam Constitutional:      Appearance:  He is well-developed.  HENT:     Head: Normocephalic and atraumatic.     Mouth/Throat:     Lips: Pink.     Mouth: Mucous membranes are moist.     Dentition: Normal dentition.  Does not have dentures. No dental tenderness, dental caries, dental abscesses or gum lesions.     Tongue: No lesions. Tongue does not deviate from midline.     Pharynx: Uvula midline. No pharyngeal swelling, oropharyngeal exudate, posterior oropharyngeal erythema, uvula swelling or postnasal drip.  Eyes:     Conjunctiva/sclera: Conjunctivae normal.  Cardiovascular:     Rate and Rhythm: Normal rate and regular rhythm.  Pulmonary:     Effort: Pulmonary effort is normal. No respiratory distress.     Breath sounds: No wheezing.  Abdominal:     General: There is no distension.     Palpations: Abdomen is soft.  Musculoskeletal:        General: No tenderness. Normal range of motion.     Cervical back: Normal range of motion and neck supple.  Skin:    General: Skin is warm and dry.     Coloration: Skin is not pale.     Findings: No erythema or rash.  Neurological:     General: No focal deficit present.     Mental Status: He is alert and oriented to person, place, and time.  Psychiatric:        Mood and Affect: Mood normal.        Behavior: Behavior normal.        Thought Content: Thought content normal.        Judgment: Judgment normal.           Assessment & Plan:   Assessment and Plan    Oral Irritation Recurrent oral irritation initially thought to be thrush, but likely due to irritation from COPD medication (Stiolto). Symptoms resolved after discontinuation of the medication. -No further action required at this time. If symptoms recur, consider ENT consultation for potential biopsy.  COPD Managed with Stiolto, which was discontinued due to suspected oral irritation. Currently using Albuterol as needed. -Continue Albuterol as needed. Follow up with Pulmonologist for further management.  General  Health Maintenance Up to date on flu and COVID vaccinations. -Continue routine vaccinations as recommended.      It is not impossible that SOME of his prior episodes where actual thrush but certainly the recent episode must not have been  HIV prevention: Risk for HIV and he does not appear to have any risk factors for HIV is HIV seronegative.  I have personally spent 80 minutes involved in face-to-face and non-face-to-face activities for this patient on the day of the visit. Professional time spent includes the following activities: Preparing to see the patient (review of tests), Obtaining and/or reviewing separately obtained history (admission/discharge record), Performing a medically appropriate examination and/or evaluation , Ordering medications/tests/procedures, referring and communicating with other health care professionals, Documenting clinical information in the EMR, Independently interpreting results (not separately reported), Communicating results to the patient/family/caregiver, Counseling and educating the patient/family/caregiver and Care coordination (not separately reported).

## 2023-08-27 ENCOUNTER — Other Ambulatory Visit: Payer: Self-pay

## 2023-08-27 ENCOUNTER — Ambulatory Visit: Payer: 59 | Admitting: Infectious Disease

## 2023-08-27 ENCOUNTER — Ambulatory Visit
Admission: RE | Admit: 2023-08-27 | Discharge: 2023-08-27 | Disposition: A | Discharge status: Home / Self Care | Payer: 59 | Source: Ambulatory Visit | Attending: Family Medicine | Admitting: Family Medicine

## 2023-08-27 ENCOUNTER — Encounter: Payer: Self-pay | Admitting: Infectious Disease

## 2023-08-27 VITALS — BP 134/79 | HR 96 | Temp 97.9°F | Ht 71.0 in | Wt 223.0 lb

## 2023-08-27 DIAGNOSIS — J449 Chronic obstructive pulmonary disease, unspecified: Secondary | ICD-10-CM

## 2023-08-27 DIAGNOSIS — Z2981 Encounter for HIV pre-exposure prophylaxis: Secondary | ICD-10-CM

## 2023-08-27 DIAGNOSIS — Z7189 Other specified counseling: Secondary | ICD-10-CM

## 2023-08-27 DIAGNOSIS — K1379 Other lesions of oral mucosa: Secondary | ICD-10-CM | POA: Diagnosis not present

## 2023-08-27 DIAGNOSIS — C61 Malignant neoplasm of prostate: Secondary | ICD-10-CM

## 2023-08-27 DIAGNOSIS — J189 Pneumonia, unspecified organism: Secondary | ICD-10-CM

## 2023-08-27 DIAGNOSIS — Z7185 Encounter for immunization safety counseling: Secondary | ICD-10-CM

## 2023-08-27 DIAGNOSIS — B370 Candidal stomatitis: Secondary | ICD-10-CM

## 2023-08-29 ENCOUNTER — Other Ambulatory Visit: Payer: Self-pay | Admitting: Family Medicine

## 2023-08-29 DIAGNOSIS — R918 Other nonspecific abnormal finding of lung field: Secondary | ICD-10-CM

## 2023-08-29 NOTE — Progress Notes (Signed)
Persistent right upper lobe opacity seen on chest x-ray after pneumonia, CT chest ordered.

## 2023-08-30 ENCOUNTER — Other Ambulatory Visit: Payer: 59

## 2023-08-30 ENCOUNTER — Telehealth: Payer: Self-pay | Admitting: Family Medicine

## 2023-08-30 NOTE — Telephone Encounter (Signed)
Caller name: BALLARD THIBODEAUX  On DPR?: Yes  Call back number: [phone] (mobile)  Provider they see: Shade Flood, MD  Reason for call:  Pt wanted to let Dr. Neva Seat know that his CT imaging has been scheduled for Thursday Dec 19th. He stated to cancel his upcoming appt on Dec. 16th until after he has imaging completed. Should he be re-scheduled after imaging or wait for a phone call update?

## 2023-08-30 NOTE — Telephone Encounter (Signed)
Got pt rescheduled for after the 19th and will call back if any other changes are needed

## 2023-09-03 ENCOUNTER — Ambulatory Visit: Payer: 59 | Admitting: Family Medicine

## 2023-09-06 ENCOUNTER — Other Ambulatory Visit: Payer: Self-pay | Admitting: Family Medicine

## 2023-09-06 ENCOUNTER — Ambulatory Visit
Admission: RE | Admit: 2023-09-06 | Discharge: 2023-09-06 | Disposition: A | Discharge status: Home / Self Care | Payer: 59 | Source: Ambulatory Visit | Attending: Family Medicine | Admitting: Family Medicine

## 2023-09-06 DIAGNOSIS — R918 Other nonspecific abnormal finding of lung field: Secondary | ICD-10-CM

## 2023-09-06 MED ORDER — IOPAMIDOL (ISOVUE-300) INJECTION 61%
75.0000 mL | Freq: Once | INTRAVENOUS | Status: AC | PRN
Start: 2023-09-06 — End: 2023-09-06
  Administered 2023-09-06: 75 mL via INTRAVENOUS

## 2023-09-10 ENCOUNTER — Encounter: Payer: Self-pay | Admitting: Family Medicine

## 2023-09-10 ENCOUNTER — Ambulatory Visit: Payer: 59 | Admitting: Family Medicine

## 2023-09-10 VITALS — BP 134/72 | HR 86 | Temp 98.8°F | Ht 71.0 in | Wt 221.6 lb

## 2023-09-10 DIAGNOSIS — R052 Subacute cough: Secondary | ICD-10-CM

## 2023-09-10 DIAGNOSIS — R634 Abnormal weight loss: Secondary | ICD-10-CM

## 2023-09-10 DIAGNOSIS — J189 Pneumonia, unspecified organism: Secondary | ICD-10-CM | POA: Diagnosis not present

## 2023-09-10 DIAGNOSIS — R9389 Abnormal findings on diagnostic imaging of other specified body structures: Secondary | ICD-10-CM | POA: Diagnosis not present

## 2023-09-10 LAB — CBC
HCT: 44.4 % (ref 39.0–52.0)
Hemoglobin: 14.7 g/dL (ref 13.0–17.0)
MCHC: 33 g/dL (ref 30.0–36.0)
MCV: 89.9 fL (ref 78.0–100.0)
Platelets: 335 10*3/uL (ref 150.0–400.0)
RBC: 4.94 Mil/uL (ref 4.22–5.81)
RDW: 13.6 % (ref 11.5–15.5)
WBC: 9.3 10*3/uL (ref 4.0–10.5)

## 2023-09-10 LAB — SEDIMENTATION RATE: Sed Rate: 20 mm/h (ref 0–20)

## 2023-09-10 MED ORDER — LEVOFLOXACIN 500 MG PO TABS: 500.0000 mg | ORAL_TABLET | Freq: Every day | ORAL | 0 refills | Status: AC

## 2023-09-10 NOTE — Patient Instructions (Addendum)
Unfortunately the CT scan has not yet been read.  We did contact the imaging facility and will try to have that read today.  Once I receive the report I will call you to discuss the results and plan.  As we discussed I would consider another antibiotic course depending on what the imaging shows, and plan to follow-up with pulmonary in January.  I will call you to discuss plan further once I receive the report.  Hang in there.

## 2023-09-10 NOTE — Telephone Encounter (Unsigned)
Copied from CRM [id number]. Topic: Clinical - Medication Question >> Sep 10, 2023  1:50 PM Dennison Nancy wrote: Reason for CRM: Patient has question regarding levofloxacin (LEVAQUIN) 500 MG tablet the side effects have not taken it yet , have concerns of side effects

## 2023-09-10 NOTE — Telephone Encounter (Signed)
Called patient to discuss concerns that he read on medication handout. He is concerned about possible side effects. Discussed concerns, reviewed same precautions and possible risks as dicussed earlier today. He feels better about taking that medication at this time and all questions were answered.  Please cancel the virtual visit tomorrow.

## 2023-09-10 NOTE — Progress Notes (Signed)
Subjective:  Patient ID: [name], male    DOB: 05-Aug-1951  Age: 72 y.o. MRN: [id number]  CC:  Chief Complaint  Patient presents with   Results    Pt notes he is doing okay mostly pt ready to go over results no questions at this time     HPI [name] presents for   Community-acquired pneumonia with abnormal imaging Treated November 19 in the ER for community-acquired pneumonia.  Noted to have right upper lobe infiltrate on chest x-ray at that time.  History of COPD.  Dose of steroids given as well as started on doxycycline.  I saw him in follow-up on December 2.  Chest x-ray report indicated hazy consolidation of right mid lung compatible with pneumonia with plan for repeat radiograph in 6 to 8 weeks to ensure resolution.  Cough had improved at his December 2 visit, and completed antibiotics week prior.  Was off Stiolto at that time due to previous thrush, only using his rescue inhaler a few times per week. Repeat chest x-ray December 9.  Right upper lobe opacity, thought to correspond to the opacity seen in the right midlung on prior study given differences in technique and better inspiration on that study.  There was no pleural effusion or pneumothorax and no acute osseous abnormality.  Given persistence recommended further evaluation with chest CT. Chest CT was performed on December 19.  Report not yet available. Radiology called to read today.   Still some cough - coughing up some phlegm, yellowish phlegm - dark.  Reports some redness in phlegm at time. Dark red in the phlegm at times - noted with pna. Some seen recently as well.  Not typical to have dark phlegm with his copd.  No fever.  Some weight loss since pneumonia in November. Had been gaining weight prior with diet. Less appetite with infection.  Night sweats - past 2 weeks- head and upper chest. Not soaking sheets.  Using albuterol every other day. Preventatively.  Sore to cough only, no other chest pains. No  calf pain/swelling.  Appt with his pulmonologist, Dr. Delton Coombes on 1/15.  No foreign travel, no tuberculosis exposure.    Wt Readings from Last 3 Encounters:  09/10/23 221 lb 9.6 oz (100.5 kg)  08/27/23 223 lb (101.2 kg)  08/20/23 224 lb 3.2 oz (101.7 kg)     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   Pain in left knee 06/02/2019   Allergic rhinitis 03/12/2017   Thrush 02/27/2017   SUI (stress urinary incontinence), male 03/20/2016   Prostate cancer (HCC) 02/09/2016   Obese 09/08/2015   S/P right TKA 09/06/2015   S/P knee replacement 09/06/2015   COPD, mild (HCC) 07/20/2014   Pulmonary nodules 02/23/2014   Tobacco use disorder, moderate, in sustained remission 02/23/2014   Recurrent right inguinal hernia 10/20/2013   ED (erectile dysfunction) 08/26/2012   Physical exam, annual 11/05/2011   HTN (hypertension) 11/05/2011   GERD (gastroesophageal reflux disease) 11/05/2011   Chronic joint pain 11/05/2011   Past Medical History:  Diagnosis Date   Allergy    seasonal   Arthritis    oa   Asthma    COPD (chronic obstructive pulmonary disease) (HCC)    GERD (gastroesophageal reflux disease)    Hypertension    Prostate cancer (HCC) 01/2016   prostatectomy  Shortness of breath dyspnea    Thrush    Past Surgical History:  Procedure Laterality Date   HERNIA REPAIR  last done 3-4 yrs ago   x 2   INGUINAL HERNIA REPAIR Right 11/20/2013   Procedure: OPEN REPAIR OF RECURRENT RIGHT INGUINAL HERNIA WITH MESH;  Surgeon: Wilmon Arms. Corliss Skains, MD;  Location: [facility];  Service: General;  Laterality: Right;   INSERTION OF MESH Right 11/20/2013   Procedure: INSERTION OF MESH;  Surgeon: Wilmon Arms. Corliss Skains, MD;  Location: [facility];  Service: General;  Laterality: Right;   KNEE SURGERY Left july 2014   mcl and meniscus repair   LYMPHADENECTOMY Bilateral 02/09/2016    Procedure: PELVIC LYMPHADENECTOMY;  Surgeon: Sebastian Ache, MD;  Location: [facility];  Service: Urology;  Laterality: Bilateral;   right knee arthroscopy      ROBOT ASSISTED LAPAROSCOPIC RADICAL PROSTATECTOMY N/A 02/09/2016   Procedure: XI ROBOTIC ASSISTED LAPAROSCOPIC RADICAL PROSTATECTOMY WITH INDOCYANINE GREEN DYE AND ADHESIOLYSIS;  Surgeon: Sebastian Ache, MD;  Location: [facility];  Service: Urology;  Laterality: N/A;   TOTAL KNEE ARTHROPLASTY Right 09/06/2015   Procedure: RIGHT TOTAL KNEE ARTHROPLASTY;  Surgeon: Durene Romans, MD;  Location: [facility];  Service: Orthopedics;  Laterality: Right;   TOTAL KNEE ARTHROPLASTY Left 06/17/2019   Procedure: TOTAL KNEE ARTHROPLASTY;  Surgeon: Durene Romans, MD;  Location: [facility];  Service: Orthopedics;  Laterality: Left;  70 mins   Allergies  Allergen Reactions   Percocet [Oxycodone-Acetaminophen] Other (See Comments)    Nausea-Doesn't work   Amoxicillin Rash    Has patient had a PCN reaction causing immediate rash, facial/tongue/throat swelling, SOB or lightheadedness with hypotension: No Has patient had a PCN reaction causing severe rash involving mucus membranes or skin necrosis: No Has patient had a PCN reaction that required hospitalization No Has patient had a PCN reaction occurring within the last 10 years: Yes- 2016 If all of the above answers are "NO", then may proceed with Cephalosporin use.    Prior to Admission medications   Medication Sig Start Date End Date Taking? Authorizing Provider  albuterol (PROVENTIL HFA) 108 (90 Base) MCG/ACT inhaler Proventil HFA 90 mcg/actuation aerosol inhaler   2 puffs by inhalation route every 4-6 hours as needed. 12/14/22  Yes Leslye Peer, MD  amLODipine (NORVASC) 5 MG tablet TAKE ONE TABLET BY MOUTH ONCE A DAY 06/26/23  Yes Shade Flood, MD  azelastine (OPTIVAR) 0.05 % ophthalmic solution Place 1 drop into both eyes 2 (two) times daily as needed. For itching/allergy symptoms 07/14/19  Yes Shade Flood, MD  Cholecalciferol (VITAMIN D3) 50 MCG (2000 UT) TABS Take 2,000 Units by mouth daily.   Yes [provider]  clobetasol cream (TEMOVATE) 0.05 % Apply topically 2 (two) times daily. Apply topically to affected areas twice a day for no longer than two weeks 07/09/23  Yes Terri Piedra, DO  Clobetasol Prop Emollient Base (CLOBETASOL PROPIONATE E) 0.05 % emollient cream Apply 1 application topically at bedtime. 08/22/21  Yes Janalyn Harder, MD  doxycycline (VIBRAMYCIN) 100 MG capsule Take 1 capsule (100 mg total) by mouth 2 (two) times daily. One po bid x 7 days 08/07/23  Yes Melene Plan, DO  fluconazole (DIFLUCAN) 150 MG tablet Take 1 tablet (150 mg total) by mouth daily. Initial dose 300mg  po x 1, 08/20/23  Yes Shade Flood, MD  KLS ALLER-FLO 50 MCG/ACT nasal spray INSTILL 2 SPRAYS INTO BOTH NOSTRILS ONCE DAILY 07/30/23  Yes Shade Flood, MD  KLS ESOMEPRAZOLE MAGNESIUM 20 MG capsule TAKE ONE CAPSULE BY MOUTH ONE TIME DAILY 06/26/23  Yes Shade Flood, MD  losartan (COZAAR) 50 MG tablet TAKE ONE TABLET BY MOUTH ONCE A DAY 08/14/23  Yes Shade Flood, MD  Menthol-Methyl Salicylate (SALONPAS PAIN RELIEF PATCH EX) Place 1 patch onto the skin daily as needed (pain.).   Yes [provider]  methocarbamol (ROBAXIN) 500 MG tablet Take 500 mg by mouth 3 (three) times daily. 08/02/21  Yes [provider]  pravastatin (PRAVACHOL) 20 MG tablet Take 1 tablet (20 mg total) by mouth daily. 07/24/22  Yes Shade Flood, MD  Tavaborole (KERYDIN) 5 % SOLN Apply 1 drop topically daily. Apply 1 drop to the toenail daily. 06/15/22  Yes Vivi Barrack, DPM  Tiotropium Bromide-Olodaterol (STIOLTO RESPIMAT) 2.5-2.5 MCG/ACT AERS Inhale 2 puffs into the lungs daily. 03/19/23  Yes Cobb, Ruby Cola, NP  triamcinolone cream (KENALOG) 0.1 % Apply 1 application topically 2 (two) times daily as needed. To affected areas only as needed. 01/08/19  Yes Shade Flood, MD  urea  (CARMOL) 40 % CREA Apply 1 Application topically daily. 10/31/22  Yes Vivi Barrack, DPM   Social History   Socioeconomic History   Marital status: Married    Spouse name: Not on file   Number of children: Not on file   Years of education: Not on file   Highest education level: Not on file  Occupational History   Not on file  Tobacco Use   Smoking status: Former    Current packs/day: 0.00    Average packs/day: 1.5 packs/day for 20.0 years (30.0 ttl pk-yrs)    Types: Cigarettes    Start date: 11/05/1979    Quit date: 11/05/1999    Years since quitting: 23.8   Smokeless tobacco: Never  Vaping Use   Vaping status: Never Used  Substance and Sexual Activity   Alcohol use: No    Comment: last used 25 years ago    Drug use: Yes    Types: Cocaine    Comment: last used cocaine  and acid 25 yrs ago,none used since   Sexual activity: Not on file  Other Topics Concern   Not on file  Social History Narrative   Not on file   Social Drivers of Health   Financial Resource Strain: Low Risk  (07/02/2023)   Overall Financial Resource Strain (CARDIA)    Difficulty of Paying Living Expenses: Not hard at all  Food Insecurity: No Food Insecurity (07/02/2023)   Hunger Vital Sign    Worried About Running Out of Food in the Last Year: Never true    Ran Out of Food in the Last Year: Never true  Transportation Needs: No Transportation Needs (07/02/2023)   PRAPARE - Administrator, Civil Service (Medical): No    Lack of Transportation (Non-Medical): No  Physical Activity: Insufficiently Active (07/02/2023)   Exercise Vital Sign    Days of Exercise per Week: 4 days    Minutes of Exercise per Session: 20 min  Stress: No Stress Concern Present (07/02/2023)   [employer] of Occupational Health - Occupational Stress Questionnaire    Feeling of Stress : Not at all  Social Connections: Moderately Isolated (07/02/2023)   Social Connection and Isolation Panel [NHANES]     Frequency of Communication with Friends and Family: Twice a week    Frequency of Social Gatherings with Friends and Family: Once a week    Attends Religious  Services: Never    Active Member of Clubs or Organizations: No    Attends Banker Meetings: Never    Marital Status: Married  Catering manager Violence: Not At Risk (07/02/2023)   Humiliation, Afraid, Rape, and Kick questionnaire    Fear of Current or Ex-Partner: No    Emotionally Abused: No    Physically Abused: No    Sexually Abused: No    Review of Systems Per HPI.   Objective:   Vitals:   09/10/23 0904  BP: 134/72  Pulse: 86  Temp: 98.8 F (37.1 C)  TempSrc: Temporal  SpO2: 98%  Weight: 221 lb 9.6 oz (100.5 kg)  Height: 5\' 11"  (1.803 m)     Physical Exam Vitals reviewed.  Constitutional:      Appearance: He is well-developed.  HENT:     Head: Normocephalic and atraumatic.  Neck:     Vascular: No carotid bruit or JVD.  Cardiovascular:     Rate and Rhythm: Normal rate and regular rhythm.     Heart sounds: Normal heart sounds. No murmur heard. Pulmonary:     Effort: Pulmonary effort is normal. No respiratory distress.     Breath sounds: Normal breath sounds. No wheezing, rhonchi or rales.  Musculoskeletal:     Right lower leg: No edema.     Left lower leg: No edema.     Comments: Calves nontender, negative Homans.  Skin:    General: Skin is warm and dry.  Neurological:     Mental Status: He is alert and oriented to person, place, and time.  Psychiatric:        Mood and Affect: Mood normal.        Assessment & Plan:  GARMON SHINA is a 72 y.o. male . Subacute cough  Abnormal CXR  Pneumonia of right middle lobe due to infectious organism  Weight loss  Right upper lobe pneumonia treated with doxycycline, improvement in cough last visit, still some residual cough today.  Repeat imaging indicated persistence of right upper lobe opacity.  CT ordered, completed 4 days ago, report  not yet available.  Imaging facility contacted to have that read today if possible.  Does report some weight loss as above but has had decreased appetite during the course of his infection.  Still with productive cough and what he describes as possible hemoptysis.  Exam reassuring in office without apparent rhonchi, rales or wheeze. - Check imaging results to see if nodule/mass present.  Consider quinolone antibiotic if persistent infiltrate.  Check CBC, sed rate today.  Will contact patient once I receive results of CT.  No orders of the defined types were placed in this encounter.  Patient Instructions  Unfortunately the CT scan has not yet been read.  We did contact the imaging facility and will try to have that read today.  Once I receive the report I will call you to discuss the results and plan.  As we discussed I would consider another antibiotic course depending on what the imaging shows, and plan to follow-up with pulmonary in January.  I will call you to discuss plan further once I receive the report.  Hang in there.    Signed,   Meredith Staggers, MD [facility], [facility] 09/10/23 9:44 AM  10:19 AM Addendum.  CT report received as below, discussed results with patient.  Will start Levaquin for possible persistence of pneumonia, infiltrate.  We did discuss possible concerns of bronchogenic  carcinoma, will treat for infection initially as above and results forwarded to his pulmonologist to decide on other testing or treatment in the interim as next appointment mid January. Potential side effects and risks of Levaquin discussed with patient including but not limited to neuropathy and tendinopathy.  All questions were answered, RTC precautions given.  CT Chest W Contrast Result Date: 09/10/2023 CLINICAL DATA:  Abnormal chest radiograph pneumonia. EXAM: CT CHEST WITH CONTRAST TECHNIQUE: Multidetector CT imaging of the chest was performed during  intravenous contrast administration. RADIATION DOSE REDUCTION: This exam was performed according to the departmental dose-optimization program which includes automated exposure control, adjustment of the mA and/or kV according to patient size and/or use of iterative reconstruction technique. CONTRAST:  75mL ISOVUE-300 IOPAMIDOL (ISOVUE-300) INJECTION 61% COMPARISON:  Chest radiograph 08/27/2023 and 08/07/2023, cardiac CT 08/28/2022. FINDINGS: Cardiovascular: Atherosclerotic calcification of the aorta. Heart is at the upper limits of normal in size to mildly enlarged. No pericardial effusion. Mediastinum/Nodes: No pathologically enlarged mediastinal, hilar or axillary lymph nodes. Esophagus is grossly unremarkable. Lungs/Pleura: Mild centrilobular emphysema. Masslike consolidation in the posterior segment right upper lobe with surrounding ground-glass and consolidation, present dating back to chest radiograph 08/07/2023. No pleural fluid. Airway is unremarkable. Upper Abdomen: Low-attenuation lesions in the kidneys, some of which are incompletely visualized. No specific follow-up necessary. Visualized portions of the liver, gallbladder, adrenal glands, kidneys, spleen, pancreas, stomach and bowel are otherwise grossly unremarkable. No upper abdominal adenopathy. Musculoskeletal: Degenerative changes in the spine. No worrisome lytic or sclerotic lesions. IMPRESSION: 1. Masslike consolidation in the posterior segment right upper lobe with surrounding ground-glass and consolidation, present dating back to chest radiograph 08/07/2023. While this may be due to resolving pneumonia, persistence worrisome for primary bronchogenic carcinoma. 2.  Aortic atherosclerosis (ICD10-I70.0). 3.  Emphysema (ICD10-J43.9). Electronically Signed   By: Leanna Battles M.D.   On: 09/10/2023 09:36

## 2023-09-11 ENCOUNTER — Telehealth: Payer: 59 | Admitting: Family

## 2023-09-13 ENCOUNTER — Other Ambulatory Visit: Payer: Self-pay | Admitting: Family Medicine

## 2023-09-13 DIAGNOSIS — E785 Hyperlipidemia, unspecified: Secondary | ICD-10-CM

## 2023-09-19 ENCOUNTER — Other Ambulatory Visit: Payer: Self-pay | Admitting: Family Medicine

## 2023-09-19 DIAGNOSIS — I10 Essential (primary) hypertension: Secondary | ICD-10-CM

## 2023-09-24 ENCOUNTER — Other Ambulatory Visit: Payer: Self-pay | Admitting: Family Medicine

## 2023-09-24 DIAGNOSIS — R12 Heartburn: Secondary | ICD-10-CM

## 2023-10-03 ENCOUNTER — Ambulatory Visit: Payer: 59 | Admitting: Emergency Medicine

## 2023-10-04 ENCOUNTER — Encounter: Payer: Self-pay | Admitting: Emergency Medicine

## 2023-10-04 ENCOUNTER — Ambulatory Visit: Payer: 59 | Admitting: Emergency Medicine

## 2023-10-04 VITALS — BP 130/70 | HR 94 | Ht 71.0 in | Wt 225.0 lb

## 2023-10-04 DIAGNOSIS — R918 Other nonspecific abnormal finding of lung field: Secondary | ICD-10-CM | POA: Diagnosis not present

## 2023-10-04 DIAGNOSIS — J449 Chronic obstructive pulmonary disease, unspecified: Secondary | ICD-10-CM

## 2023-10-04 NOTE — Patient Instructions (Addendum)
We reviewed your CT scan of the chest today. We will plan for a repeat CT scan of the chest in the next 2 weeks to assess right upper lobe opacity for improvement. We will also schedule navigational bronchoscopy for 10/23/23 to evaluate right upper lobe opacity assuming it does not resolve.  This would be done under general anesthesia as an outpatient at [facility] endoscopy.  You will need a designated driver.  You will need someone to watch you at home that day after the procedure. Keep your albuterol available to use 2 puffs when needed for shortness of breath, chest tightness, wheezing. We may decide to restart daily inhaled medication going forward.  We will consider possible nebulized treatment since you have had such difficulty with mouth irritation with standard inhalers. Follow with Saralyn Pilar, NP 1 week after your planned bronchoscopy.

## 2023-10-04 NOTE — Assessment & Plan Note (Signed)
He has not significantly missed the Stiolto, is using albuterol 1-2 times a week.  He may still need to go back on schedule medication in the future.  If so we will try a nebulizer version because he had such difficulty with mouth irritation.

## 2023-10-04 NOTE — Assessment & Plan Note (Signed)
Newly identified right upper lobe rounded opacity, treated as possible CAP.  He did have a clinical pneumonia and is clinically improved.  Hopefully this will resolve but it was persistent after 1 month on CT chest 09/06/2023.  I did explain to him that it is possible this is a mass lesion that would require biopsy.  Were going to plan for a repeat CT chest in the next 2 weeks but also schedule navigational bronchoscopy for biopsy.  We can always cancel the robotic Nav if the right upper lobe mass resolves.

## 2023-10-04 NOTE — Progress Notes (Signed)
Subjective:    Patient ID: [name], male    DOB: 1950-09-29, 73 y.o.   MRN: [id number]  HPI  ROV 07/26/2023 -[name] is 57 with COPD and history of pulmonary nodules that were deemed benign based on serial follow-up CTs.  He also has chronic cough with GERD and allergic rhinitis.  Has been having difficulty with his BD due to tongue burning and stinging, not always associated with white plaquing.  He has been treated for possible thrush multiple times in the last year.  He is currently on Stiolto. He is going to see ID to insure there is not another cause for recurrent thrush. He is still having almost continuous mouth irritation, not always associated with white plaques.   ROV 10/04/2023 --follow-up visit for [name].  He is 74 with a history of COPD and pulmonary nodular disease that were followed with serial CT scans and deemed to be benign.  When I last saw him he was having significant difficulty with recurrent tongue and mouth burning suggestive of probable thrush.  I stopped his Stiolto as a potential contributor to see how he would do - he hasn't had any mouth irritation since. Uses albuterol qod.  Today he reports that he was evaluated for chest discomfort and coughing since I last saw him.  A chest x-ray 08/07/2023 showed a possible right midlung opacity and he was treated for pneumonia.  Repeat chest x-ray 08/27/2023 questioned and opacity as well prompting CT chest as below. He was coughing up a lot of phlegm with some possible hemoptysis, had HA, fever  CT scan of the chest 09/06/2023 reviewed by me, shows mild centrilobular emphysema and a rounded somewhat spiculated masslike consolidation in the posterior segment of the right upper lobe with some surrounding groundglass     Review of Systems  Constitutional:  Negative for fever and unexpected weight change.  HENT:  Negative for congestion, dental problem, ear pain, nosebleeds, postnasal drip, rhinorrhea, sinus pressure,  sneezing, sore throat and trouble swallowing.   Eyes:  Negative for redness and itching.  Respiratory:  Negative for chest tightness, shortness of breath and wheezing.   Cardiovascular:  Negative for palpitations and leg swelling.       Knees   Gastrointestinal:  Negative for nausea and vomiting.  Genitourinary:  Negative for dysuria.  Musculoskeletal:  Negative for joint swelling.  Skin:  Negative for rash.  Neurological:  Negative for headaches.  Hematological:  Does not bruise/bleed easily.  Psychiatric/Behavioral:  Negative for dysphoric mood. The patient is not nervous/anxious.        Objective:   Physical Exam Vitals:   10/04/23 1540  BP: 130/70  Pulse: 94  SpO2: 96%  Weight: 225 lb (102.1 kg)  Height: 5\' 11"  (1.803 m)     Gen: Pleasant, well-nourished, in no distress,  normal affect  ENT: No lesions,  mouth clear, he has some white areas on the tongue without any overt plaques.  Pharynx looks clear  Neck: No JVD, no stridor  Lungs: No use of accessory muscles, clear without rales or rhonchi  Cardiovascular: RRR, heart sounds normal, no murmur or gallops, no peripheral edema  Musculoskeletal: No deformities, no cyanosis or clubbing  Neuro: alert, non focal  Skin: Warm, no lesions or rash       Assessment & Plan:  Mass of upper lobe of right lung Newly identified right upper lobe rounded opacity, treated as possible CAP.  He did have a clinical pneumonia and is  clinically improved.  Hopefully this will resolve but it was persistent after 1 month on CT chest 09/06/2023.  I did explain to him that it is possible this is a mass lesion that would require biopsy.  Were going to plan for a repeat CT chest in the next 2 weeks but also schedule navigational bronchoscopy for biopsy.  We can always cancel the robotic Nav if the right upper lobe mass resolves.  COPD, mild He has not significantly missed the Stiolto, is using albuterol 1-2 times a week.  He may still need  to go back on schedule medication in the future.  If so we will try a nebulizer version because he had such difficulty with mouth irritation.     Pedro Pupa, MD, PhD 10/04/2023, 4:08 PM [facility] [phone] or if no answer [phone]

## 2023-10-12 ENCOUNTER — Ambulatory Visit
Admission: RE | Admit: 2023-10-12 | Discharge: 2023-10-12 | Disposition: A | Discharge status: Home / Self Care | Payer: 59 | Source: Ambulatory Visit | Attending: Emergency Medicine | Admitting: Emergency Medicine

## 2023-10-12 DIAGNOSIS — R918 Other nonspecific abnormal finding of lung field: Secondary | ICD-10-CM

## 2023-10-16 ENCOUNTER — Telehealth: Payer: Self-pay | Admitting: Emergency Medicine

## 2023-10-16 DIAGNOSIS — R918 Other nonspecific abnormal finding of lung field: Secondary | ICD-10-CM

## 2023-10-16 NOTE — Telephone Encounter (Signed)
PT called saying he needed the results of his CT. No results in. Adv him that it takes 2-3 weeks to get results.   He said Dr. Delton Coombes needed those results before  an upcoming surgery on 2/4. With this information in hand I assume Dr. Delton Coombes must have  requested results stat. Please call PT to advise results. His # is [phone]

## 2023-10-17 NOTE — Telephone Encounter (Signed)
PT's wife said PT gets home after 3:00, Dr. Leonard Schwartz.

## 2023-10-17 NOTE — Telephone Encounter (Signed)
I called and spoke with the pt's wife (DPR) Wife states the pt needs the CT results before his scheduled surgery on 10-23-23. The CT results determine if the pt can have the surgery. I informed the wife that I would call the radiologist to have them expedite the reading of his CT that he had done on 10-12-23. Pt verbalized understanding. NFN. Sending to Dr Delton Coombes so he is aware.

## 2023-10-17 NOTE — Telephone Encounter (Signed)
I reviewed his CT chest.  Called the patient to discuss but no answer, left him a brief voicemail and will try to call him back.  The CT shows resolving right upper lobe pneumonia, improved area of scar/atelectasis.  Reassuring study.

## 2023-10-18 NOTE — Telephone Encounter (Signed)
Pt's wife (DPR) states she wants to know if he can still have the surgery, or if he still needs it, and what exactly does those results mean for the pt?    Pt wife demands to speak to Dr Delton Coombes and proceeded to become very upset. Wife states he can call the pt from 3-9 pm. I informed the wife that Dr Delton Coombes is seeing pt's today and may not be able to call the pt himself, so if needed, I will still send a message back to Dr Delton Coombes for him to review, and I could get back to them. Pts wife states that the pt is still a patient too and has rights to be spoken to by the DR. I explained to the wife that Dr Delton Coombes is physically seeing pts in office by scheduled appointments, but I will relay messages back and the pt will not go untreated or forgotten about. Sending to Dr Delton Coombes to advise.

## 2023-10-18 NOTE — Telephone Encounter (Signed)
I discussed the CT findings with the patient today by phone.  He is right upper lobe opacity has decreased significantly in size consistent with a resolving pneumonia.  I would like to repeat his CT in 3 months to look for interval change or any residual abnormality that would need evaluation.  I will order that scan now  He does not need to follow-up with [place] on 10/30/2023, does need to follow-up with [place] after his CT in late April

## 2023-10-19 NOTE — Telephone Encounter (Signed)
ATC X1. Lmtcb

## 2023-10-23 ENCOUNTER — Encounter (HOSPITAL_COMMUNITY): Payer: Self-pay

## 2023-10-23 ENCOUNTER — Ambulatory Visit (HOSPITAL_COMMUNITY): Admit: 2023-10-23 | Payer: 59 | Admitting: Emergency Medicine

## 2023-10-23 SURGERY — BRONCHOSCOPY, WITH BIOPSY USING ELECTROMAGNETIC NAVIGATION
Anesthesia: General | Laterality: Right

## 2023-10-23 NOTE — Telephone Encounter (Signed)
Patient is trying to return missed call.

## 2023-10-23 NOTE — Telephone Encounter (Signed)
 Lm for patient.

## 2023-10-23 NOTE — Telephone Encounter (Signed)
ATC X2. Lmtcb

## 2023-10-23 NOTE — Telephone Encounter (Signed)
Patient is returning phone call. Patient phone number is [phone].

## 2023-10-29 NOTE — Telephone Encounter (Signed)
 ATC Pt. LMTCB.  I sent the pt a mychart message regarding the note from RB. Routing to front desk to cancel appointment. NFN

## 2023-10-30 ENCOUNTER — Ambulatory Visit: Payer: 59 | Admitting: Acute Care

## 2023-10-31 ENCOUNTER — Ambulatory Visit: Payer: 59 | Admitting: Emergency Medicine

## 2023-11-01 NOTE — Telephone Encounter (Signed)
NFN

## 2023-11-12 ENCOUNTER — Other Ambulatory Visit: Payer: Self-pay | Admitting: Family Medicine

## 2023-11-12 DIAGNOSIS — I10 Essential (primary) hypertension: Secondary | ICD-10-CM

## 2023-12-24 ENCOUNTER — Ambulatory Visit
Admission: RE | Admit: 2023-12-24 | Discharge: 2023-12-24 | Disposition: A | Discharge status: Home / Self Care | Payer: 59 | Source: Ambulatory Visit | Attending: Emergency Medicine | Admitting: Emergency Medicine

## 2023-12-24 ENCOUNTER — Other Ambulatory Visit: Payer: Self-pay | Admitting: Family Medicine

## 2023-12-24 DIAGNOSIS — R12 Heartburn: Secondary | ICD-10-CM

## 2023-12-24 DIAGNOSIS — R918 Other nonspecific abnormal finding of lung field: Secondary | ICD-10-CM

## 2023-12-24 DIAGNOSIS — E785 Hyperlipidemia, unspecified: Secondary | ICD-10-CM

## 2023-12-24 DIAGNOSIS — I10 Essential (primary) hypertension: Secondary | ICD-10-CM

## 2024-01-10 ENCOUNTER — Encounter: Payer: Self-pay | Admitting: Emergency Medicine

## 2024-01-10 ENCOUNTER — Ambulatory Visit: Payer: 59 | Admitting: Emergency Medicine

## 2024-01-10 ENCOUNTER — Ambulatory Visit: Admitting: Emergency Medicine

## 2024-01-10 VITALS — BP 148/95 | HR 79 | Temp 98.3°F | Ht 72.0 in | Wt 228.0 lb

## 2024-01-10 DIAGNOSIS — J449 Chronic obstructive pulmonary disease, unspecified: Secondary | ICD-10-CM | POA: Diagnosis not present

## 2024-01-10 DIAGNOSIS — Z87891 Personal history of nicotine dependence: Secondary | ICD-10-CM

## 2024-01-10 DIAGNOSIS — R918 Other nonspecific abnormal finding of lung field: Secondary | ICD-10-CM | POA: Diagnosis not present

## 2024-01-10 MED ORDER — ALBUTEROL SULFATE HFA 108 (90 BASE) MCG/ACT IN AERS: INHALATION_SPRAY | RESPIRATORY_TRACT | 11 refills | Status: AC

## 2024-01-10 NOTE — Assessment & Plan Note (Signed)
 Has decreased in size and prominence on serial imaging, consistent with a resolving community-acquired pneumonia.  There is some residual right upper lobe scar.  Reassuring scan.  Will plan to repeat in 1 year given his tobacco history.

## 2024-01-10 NOTE — Assessment & Plan Note (Signed)
 He has had mouth irritation with multiple maintenance inhalers.  Most recently Stiolto.  Will hold off on a maintenance BD for now, continue albuterol  as needed.  If his albuterol  use goes up or dyspnea worsens then we will have to look for a maintenance alternative.

## 2024-01-10 NOTE — Progress Notes (Signed)
 Subjective:    Patient ID: [name], male    DOB: 28-Jan-1951, 73 y.o.   MRN: [id number]  HPI  ROV 10/04/2023 --follow-up visit for [name].  He is 73 with a history of COPD and pulmonary nodular disease that were followed with serial CT scans and deemed to be benign.  When I last saw him he was having significant difficulty with recurrent tongue and mouth burning suggestive of probable thrush.  I stopped his Stiolto as a potential contributor to see how he would do - he hasn't had any mouth irritation since. Uses albuterol  qod.  Today he reports that he was evaluated for chest discomfort and coughing since I last saw him.  A chest x-ray 08/07/2023 showed a possible right midlung opacity and he was treated for pneumonia.  Repeat chest x-ray 08/27/2023 questioned and opacity as well prompting CT chest as below. He was coughing up a lot of phlegm with some possible hemoptysis, had HA, fever  CT scan of the chest 09/06/2023 reviewed by me, shows mild centrilobular emphysema and a rounded somewhat spiculated masslike consolidation in the posterior segment of the right upper lobe with some surrounding groundglass  ROV 01/10/24 --73 year old man with history of COPD, pulmonary nodules on CT scan of the chest.  He had a recent pneumonia in November with a repeat chest x-ray in December that question a persistent right midlung opacity.  We considered bronchoscopy to evaluate the lesion but his CT scan 10/08/2023 showed an interval decrease in size and the right upper lobe bandlike opacity consistent with resolving pneumonia.  Based on that we decided to follow with serial CTs. He is having some allergy, some cough w scant mucous. He is off stiolto, stopped due to mouth irritation. He is having some dyspnea w bending. He is able to do his usual activities, work in the yard. He uses albuterol  a few times a week.   CT scan of the chest done 12/24/23 reviewed by me shows continued significant improvement in  the right upper lobe opacity.  There is some residual mild bandlike scar but almost full resolution.  The official report is still pending   Review of Systems  Constitutional:  Negative for fever and unexpected weight change.  HENT:  Negative for congestion, dental problem, ear pain, nosebleeds, postnasal drip, rhinorrhea, sinus pressure, sneezing, sore throat and trouble swallowing.   Eyes:  Negative for redness and itching.  Respiratory:  Negative for chest tightness, shortness of breath and wheezing.   Cardiovascular:  Negative for palpitations and leg swelling.       Knees   Gastrointestinal:  Negative for nausea and vomiting.  Genitourinary:  Negative for dysuria.  Musculoskeletal:  Negative for joint swelling.  Skin:  Negative for rash.  Neurological:  Negative for headaches.  Hematological:  Does not bruise/bleed easily.  Psychiatric/Behavioral:  Negative for dysphoric mood. The patient is not nervous/anxious.        Objective:   Physical Exam Vitals:   01/10/24 1612  BP: (!) 148/95  Pulse: 79  Temp: 98.3 F (36.8 C)  TempSrc: Oral  SpO2: 95%  Weight: 228 lb (103.4 kg)  Height: 6' (1.829 m)     Gen: Pleasant, well-nourished, in no distress,  normal affect  ENT: No lesions,  mouth clear, he has some white areas on the tongue without any overt plaques.  Pharynx looks clear  Neck: No JVD, no stridor  Lungs: No use of accessory muscles, clear without rales or rhonchi  Cardiovascular: RRR, heart sounds normal, no murmur or gallops, no peripheral edema  Musculoskeletal: No deformities, no cyanosis or clubbing  Neuro: alert, non focal  Skin: Warm, no lesions or rash       Assessment & Plan:  Mass of upper lobe of right lung Has decreased in size and prominence on serial imaging, consistent with a resolving community-acquired pneumonia.  There is some residual right upper lobe scar.  Reassuring scan.  Will plan to repeat in 1 year given his tobacco  history.  COPD, mild He has had mouth irritation with multiple maintenance inhalers.  Most recently Stiolto.  Will hold off on a maintenance BD for now, continue albuterol  as needed.  If his albuterol  use goes up or dyspnea worsens then we will have to look for a maintenance alternative.   Time spent 32 minutes   Racheal Buddle, MD, PhD 01/10/2024, 5:02 PM  Pulmonary and Critical Care [phone] or if no answer [phone]

## 2024-01-10 NOTE — Patient Instructions (Addendum)
 We reviewed your CT scan of the chest today.  There is been significant improvement in your right upper lobe opacity consistent with a resolving pneumonia.  This is good news. Will plan to repeat your CT in 1 year We will hold off on starting a scheduled inhaler medication for now.  We may revisit this going forward depending on how you are doing. Please keep your albuterol  available to use 2 puffs when needed for shortness of breath, chest tightness, wheezing. Follow with Dr. Baldwin Levee in about 6 months, sooner if you have any problems.

## 2024-01-15 ENCOUNTER — Other Ambulatory Visit: Payer: Self-pay | Admitting: Podiatry

## 2024-02-12 ENCOUNTER — Encounter: Payer: Self-pay | Admitting: Dermatology

## 2024-02-12 ENCOUNTER — Ambulatory Visit: Admitting: Dermatology

## 2024-02-12 VITALS — BP 154/76

## 2024-02-12 DIAGNOSIS — D2371 Other benign neoplasm of skin of right lower limb, including hip: Secondary | ICD-10-CM | POA: Diagnosis not present

## 2024-02-12 DIAGNOSIS — D171 Benign lipomatous neoplasm of skin and subcutaneous tissue of trunk: Secondary | ICD-10-CM

## 2024-02-12 DIAGNOSIS — D2372 Other benign neoplasm of skin of left lower limb, including hip: Secondary | ICD-10-CM | POA: Diagnosis not present

## 2024-02-12 DIAGNOSIS — D239 Other benign neoplasm of skin, unspecified: Secondary | ICD-10-CM

## 2024-02-12 MED ORDER — CLOBETASOL PROPIONATE 0.05 % EX CREA: TOPICAL_CREAM | Freq: Two times a day (BID) | CUTANEOUS | 0 refills | Status: AC

## 2024-02-12 NOTE — Progress Notes (Signed)
   Follow-Up Visit   Subjective  [name] is a 73 y.o. male who presents for the following: Bumps of arms and legs that are itchy. They started popping up a few months ago and they are itchy. He has been using clobetasol  once or twice a week.    The following portions of the chart were reviewed this encounter and updated as appropriate: medications, allergies, medical history  Review of Systems:  No other skin or systemic complaints except as noted in HPI or Assessment and Plan.  Objective  Well appearing patient in no apparent distress; mood and affect are within normal limits.   A focused examination was performed of the following areas: Arms and legs   Relevant exam findings are noted in the Assessment and Plan.    Assessment & Plan   1. 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 These are described as bumps that have developed from scratches or bug bites, resulting in balls of dark scar tissue.  PE shows hyperpigmented, flat-topped papules with a collar of scale. No evidence of skin cancer was found on examination.  - Plan:    Prescribe clobetasol  for itching:     - Apply twice daily for up to 2 weeks, then break for 2 weeks     - Informed patient that this regimen can be repeated as needed    Advised patient to avoid scratching    Recommended use of insect repellent, including repellent bandages    Suggested wearing sunscreen    Follow up in one year  2. Lipoma - Assessment:  Known lipoma, currently measuring 5 cm, which has not increased in size since the last measurement. The lipoma is not causing significant discomfort, though the patient reports feeling it when sitting on hard surfaces. No indication of malignancy or need for immediate intervention.  - Plan:    Continue monitoring lipoma size and symptoms    Informed patient about surgical removal option if lipoma grows or becomes painful    No  immediate intervention required  Follow-up in one year for reassessment of dermatofibromas and lipoma.   Return in about 1 year (around 02/11/2025).  I, Eliot Guernsey, CMA, am acting as scribe for [employer], DO .   Documentation: I have reviewed the above documentation for accuracy and completeness, and I agree with the above.  Louana Roup, DO

## 2024-02-12 NOTE — Patient Instructions (Addendum)

## 2024-02-19 ENCOUNTER — Other Ambulatory Visit: Payer: Self-pay | Admitting: Family Medicine

## 2024-02-19 DIAGNOSIS — I10 Essential (primary) hypertension: Secondary | ICD-10-CM

## 2024-03-11 ENCOUNTER — Other Ambulatory Visit: Payer: Self-pay | Admitting: Family Medicine

## 2024-03-11 DIAGNOSIS — J309 Allergic rhinitis, unspecified: Secondary | ICD-10-CM

## 2024-04-01 ENCOUNTER — Other Ambulatory Visit: Payer: Self-pay | Admitting: Family Medicine

## 2024-04-01 DIAGNOSIS — R12 Heartburn: Secondary | ICD-10-CM

## 2024-04-01 DIAGNOSIS — I10 Essential (primary) hypertension: Secondary | ICD-10-CM

## 2024-04-01 DIAGNOSIS — E785 Hyperlipidemia, unspecified: Secondary | ICD-10-CM

## 2024-04-02 ENCOUNTER — Telehealth: Payer: Self-pay

## 2024-04-02 DIAGNOSIS — I10 Essential (primary) hypertension: Secondary | ICD-10-CM

## 2024-04-02 DIAGNOSIS — E785 Hyperlipidemia, unspecified: Secondary | ICD-10-CM

## 2024-04-02 MED ORDER — PRAVASTATIN SODIUM 20 MG PO TABS: 20.0000 mg | ORAL_TABLET | Freq: Every day | ORAL | 0 refills | Status: DC

## 2024-04-02 MED ORDER — AMLODIPINE BESYLATE 5 MG PO TABS: 5.0000 mg | ORAL_TABLET | Freq: Every day | ORAL | 0 refills | Status: DC

## 2024-04-02 NOTE — Telephone Encounter (Signed)
 Called patient and scheduled a follow up with Dr. Levora. Sending in a 30 day refill.

## 2024-04-02 NOTE — Telephone Encounter (Signed)
 Copied from CRM [id number]. Topic: Clinical - Medication Question >> Apr 02, 2024  9:51 AM Henretta I wrote: Reason for CRM: Patients refill request was denied due to fill too soon and he has only  4 amLODipine  (NORVASC ) 5 MG tablet, 8 pravastatin  (PRAVACHOL ) 20 MG tablet week and about a week and half left on rest of medication. Patient would like to know he should do as he is almost out and needs refills.

## 2024-04-07 ENCOUNTER — Encounter: Payer: Self-pay | Admitting: Family Medicine

## 2024-04-07 ENCOUNTER — Ambulatory Visit: Admitting: Family Medicine

## 2024-04-07 VITALS — BP 130/60 | HR 97 | Temp 99.3°F | Ht 72.0 in | Wt 226.0 lb

## 2024-04-07 DIAGNOSIS — I10 Essential (primary) hypertension: Secondary | ICD-10-CM

## 2024-04-07 DIAGNOSIS — E785 Hyperlipidemia, unspecified: Secondary | ICD-10-CM | POA: Diagnosis not present

## 2024-04-07 DIAGNOSIS — G8929 Other chronic pain: Secondary | ICD-10-CM

## 2024-04-07 DIAGNOSIS — M2550 Pain in unspecified joint: Secondary | ICD-10-CM

## 2024-04-07 DIAGNOSIS — R12 Heartburn: Secondary | ICD-10-CM

## 2024-04-07 DIAGNOSIS — J449 Chronic obstructive pulmonary disease, unspecified: Secondary | ICD-10-CM | POA: Diagnosis not present

## 2024-04-07 DIAGNOSIS — J309 Allergic rhinitis, unspecified: Secondary | ICD-10-CM

## 2024-04-07 DIAGNOSIS — Z96653 Presence of artificial knee joint, bilateral: Secondary | ICD-10-CM

## 2024-04-07 LAB — COMPREHENSIVE METABOLIC PANEL WITH GFR
ALT: 34 U/L (ref 0–53)
AST: 22 U/L (ref 0–37)
Albumin: 4.6 g/dL (ref 3.5–5.2)
Alkaline Phosphatase: 92 U/L (ref 39–117)
BUN: 11 mg/dL (ref 6–23)
CO2: 29 meq/L (ref 19–32)
Calcium: 9.2 mg/dL (ref 8.4–10.5)
Chloride: 103 meq/L (ref 96–112)
Creatinine, Ser: 0.79 mg/dL (ref 0.40–1.50)
GFR: 88.71 mL/min (ref >60.00)
Glucose, Bld: 113 mg/dL — ABNORMAL HIGH (ref 70–99)
Potassium: 3.8 meq/L (ref 3.5–5.1)
Sodium: 139 meq/L (ref 135–145)
Total Bilirubin: 0.5 mg/dL (ref 0.2–1.2)
Total Protein: 6.9 g/dL (ref 6.0–8.3)

## 2024-04-07 LAB — LIPID PANEL
Cholesterol: 153 mg/dL (ref 0–200)
HDL: 30.5 mg/dL — ABNORMAL LOW (ref >39.00)
LDL Cholesterol: 83 mg/dL (ref 0–99)
NonHDL: 122.34
Total CHOL/HDL Ratio: 5
Triglycerides: 197 mg/dL — ABNORMAL HIGH (ref 0.0–149.0)
VLDL: 39.4 mg/dL (ref 0.0–40.0)

## 2024-04-07 MED ORDER — ESOMEPRAZOLE MAGNESIUM 20 MG PO CPDR: 20.0000 mg | DELAYED_RELEASE_CAPSULE | Freq: Every day | ORAL | 2 refills | Status: AC

## 2024-04-07 MED ORDER — PRAVASTATIN SODIUM 20 MG PO TABS: 20.0000 mg | ORAL_TABLET | Freq: Every day | ORAL | 2 refills | Status: AC

## 2024-04-07 MED ORDER — LOSARTAN POTASSIUM 50 MG PO TABS: 50.0000 mg | ORAL_TABLET | Freq: Every day | ORAL | 2 refills | Status: AC

## 2024-04-07 MED ORDER — AMLODIPINE BESYLATE 5 MG PO TABS: 5.0000 mg | ORAL_TABLET | Freq: Every day | ORAL | 2 refills | Status: AC

## 2024-04-07 NOTE — Patient Instructions (Addendum)
 Good news on prior area of concern in the lungs!. Keep follow up with Dr. Shelah as planned.  Keep follow up with Dr. Alvaro for prostate testing and monitoring of areas on kidneys.  Call your orthopaedic doctor about your knee pain as prior replacements and continued pain in spite of the over the counter meds.    Thank you for coming in today. No change in medications at this time. If there are any concerns on your bloodwork, I will let you know. Take care!

## 2024-04-07 NOTE — Progress Notes (Signed)
 Subjective:  Patient ID: [name], male    DOB: 1950/09/22  Age: 73 y.o. MRN: [id number]  CC:  Chief Complaint  Patient presents with   Medication Refill    Need refills on all meds   Annual Exam    Possible physical?    Knee Pain    Started a couple of months ago; standing or sitting for long periods of time; aching pain    HPI [name] presents for   COPD/pulmonary Followed by Dr. Shelah, previously treated with Stiolto, albuterol  as needed.  Treated for suspected recurrent thrush last year.  Community-acquired pneumonia in November.  Followed for serial CT scans deemed to be benign.  Stiolto had been stopped as potential contributor to thrush.  Albuterol  every other day based on April 24 pulmonary visit.  Possible right midlung opacity on chest x-ray November, repeat chest x-ray in December question opacity, with CT chest in December with somewhat spiculated masslike consolidation in posterior segment of the right upper lobe.  Interval decrease in size in January on CT, bronchoscopy deferred.  Bandlike opacity consistent with resolving pneumonia, plan for repeat serial CTs.  CT chest 12/24/2023 with continued significant improvement in the right upper lobe opacity.  Almost full resolution of bandlike scar. Repeat scan planned in 1 year. Due to mouth irritation with inhalers held off on maintenance bronchodilator, continue albuterol  as needed with option to look at maintenance alternative if increased use of albuterol .  Stable - albuterol  every other day. No new cough/dyspnea.   Urologic followed by Dr. Alvaro.   Note reviewed from October.  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  Annual surveillance until 2027.  Trimix as needed for erectile dysfunction.  Renal cyst/lesions were reassuring, plan for surveillance for the very small area on the left, and consider therapy if greater than 2.5 to 3 cm based on his age.  1 year  follow-up planned with renal ultrasound and MRI in a few years.  Hyperlipidemia: Pravastatin  20 mg daily. No new myalgias/side effects.  Arthritis pain in knees at times. No locking/giving way. Prior bilateral knee replacements - plans to follow up with ortho. Dr. Ernie.  No relief with topical voltaren, advil , tylenol   Lab Results  Component Value Date   CHOL 132 12/11/2022   HDL 35.50 (L) 12/11/2022   LDLCALC 71 12/11/2022   TRIG 129.0 12/11/2022   CHOLHDL 4 12/11/2022   Lab Results  Component Value Date   ALT 31 12/11/2022   AST 19 12/11/2022   ALKPHOS 86 12/11/2022   BILITOT 0.5 12/11/2022   Hypertension: Amlodipine  5 mg daily, losartan  50 mg daily - denies any side effects. Home readings: none BP Readings from Last 3 Encounters:  04/07/24 130/60  02/12/24 (!) 154/76  01/10/24 (!) 148/95   Lab Results  Component Value Date   CREATININE 0.85 12/11/2022   Allergic Rhinitis Stable with use of nasal spray.   GERD Stable with use of nexium    HM:  Up to date.     History Patient Active Problem List   Diagnosis Date Noted   Mass of upper lobe of right lung 10/04/2023   Diverticulitis of colon 04/19/2021   S/P left TKA 06/17/2019   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/17/2019   Pain in left knee 06/02/2019   Allergic rhinitis 03/12/2017   Thrush 02/27/2017   SUI (stress urinary incontinence), male 03/20/2016   Prostate cancer (HCC) 02/09/2016  Obese 09/08/2015   S/P right TKA 09/06/2015   S/P knee replacement 09/06/2015   COPD, mild (HCC) 07/20/2014   Pulmonary nodules 02/23/2014   Tobacco use disorder, moderate, in sustained remission 02/23/2014   Recurrent right inguinal hernia 10/20/2013   ED (erectile dysfunction) 08/26/2012   Physical exam, annual 11/05/2011   HTN (hypertension) 11/05/2011   GERD (gastroesophageal reflux disease) 11/05/2011   Chronic joint pain 11/05/2011   Past Medical History:  Diagnosis Date   Allergy    seasonal    Arthritis    oa   Asthma    COPD (chronic obstructive pulmonary disease) (HCC)    GERD (gastroesophageal reflux disease)    Hypertension    Prostate cancer (HCC) 01/2016   prostatectomy    Shortness of breath dyspnea    Thrush    Past Surgical History:  Procedure Laterality Date   HERNIA REPAIR  last done 3-4 yrs ago   x 2   INGUINAL HERNIA REPAIR Right 11/20/2013   Procedure: OPEN REPAIR OF RECURRENT RIGHT INGUINAL HERNIA WITH MESH;  Surgeon: Donnice POUR. Belinda, MD;  Location: [facility];  Service: General;  Laterality: Right;   INSERTION OF MESH Right 11/20/2013   Procedure: INSERTION OF MESH;  Surgeon: Donnice POUR. Belinda, MD;  Location: [facility];  Service: General;  Laterality: Right;   KNEE SURGERY Left july 2014   mcl and meniscus repair   LYMPHADENECTOMY Bilateral 02/09/2016   Procedure: PELVIC LYMPHADENECTOMY;  Surgeon: Ricardo Likens, MD;  Location: [facility];  Service: Urology;  Laterality: Bilateral;   right knee arthroscopy      ROBOT ASSISTED LAPAROSCOPIC RADICAL PROSTATECTOMY N/A 02/09/2016   Procedure: XI ROBOTIC ASSISTED LAPAROSCOPIC RADICAL PROSTATECTOMY WITH INDOCYANINE GREEN DYE AND ADHESIOLYSIS;  Surgeon: Ricardo Likens, MD;  Location: [facility];  Service: Urology;  Laterality: N/A;   TOTAL KNEE ARTHROPLASTY Right 09/06/2015   Procedure: RIGHT TOTAL KNEE ARTHROPLASTY;  Surgeon: Donnice Car, MD;  Location: [facility];  Service: Orthopedics;  Laterality: Right;   TOTAL KNEE ARTHROPLASTY Left 06/17/2019   Procedure: TOTAL KNEE ARTHROPLASTY;  Surgeon: Car Donnice, MD;  Location: [facility];  Service: Orthopedics;  Laterality: Left;  70 mins   Allergies  Allergen Reactions   Percocet [Oxycodone -Acetaminophen ] Other (See Comments)    Nausea-Doesn't work   Amoxicillin  Rash    Has patient had a PCN reaction causing immediate rash, facial/tongue/throat swelling, SOB or lightheadedness with hypotension: No Has patient had a PCN reaction causing severe rash involving mucus membranes or skin  necrosis: No Has patient had a PCN reaction that required hospitalization No Has patient had a PCN reaction occurring within the last 10 years: Yes- 2016 If all of the above answers are NO, then may proceed with Cephalosporin use.    Prior to Admission medications   Medication Sig Start Date End Date Taking? Authorizing Provider  albuterol  (PROVENTIL  HFA) 108 (90 Base) MCG/ACT inhaler Proventil  HFA 90 mcg/actuation aerosol inhaler   2 puffs by inhalation route every 4-6 hours as needed. 01/10/24  Yes Shelah Lamar RAMAN, MD  amLODipine  (NORVASC ) 5 MG tablet Take 1 tablet (5 mg total) by mouth daily. 04/02/24  Yes Levora Reyes SAUNDERS, MD  azelastine  (OPTIVAR ) 0.05 % ophthalmic solution Place 1 drop into both eyes 2 (two) times daily as needed. For itching/allergy symptoms 07/14/19  Yes Levora Reyes SAUNDERS, MD  Cholecalciferol (VITAMIN D3) 50 MCG (2000 UT) TABS Take 2,000 Units by mouth daily.   Yes [provider]  clobetasol  cream (TEMOVATE ) 0.05 % Apply  topically 2 (two) times daily. Apply topically to affected areas twice a day for no longer than two weeks 02/12/24  Yes Alm Delon SAILOR, DO  Clobetasol  Prop Emollient Base (CLOBETASOL  PROPIONATE E) 0.05 % emollient cream Apply 1 application topically at bedtime. 08/22/21  Yes Livingston Rigg, MD  doxycycline  (VIBRAMYCIN ) 100 MG capsule Take 1 capsule (100 mg total) by mouth 2 (two) times daily. One po bid x 7 days 08/07/23  Yes Emil Share, DO  fluconazole  (DIFLUCAN ) 150 MG tablet Take 1 tablet (150 mg total) by mouth daily. Initial dose 300mg  po x 1, 08/20/23  Yes Levora Reyes SAUNDERS, MD  KLS ALLER-FLO 50 MCG/ACT nasal spray INSTILL 2 SPRAYS INTO BOTH NOSTRILS ONCE DAILY 03/11/24  Yes Levora Reyes SAUNDERS, MD  KLS ESOMEPRAZOLE  MAGNESIUM  20 MG capsule take 1 capsule by mouth once a day 12/24/23  Yes Levora Reyes SAUNDERS, MD  losartan  (COZAAR ) 50 MG tablet TAKE ONE TABLET BY MOUTH ONCE A DAY 02/19/24  Yes Levora Reyes SAUNDERS, MD  meloxicam  (MOBIC ) 15 MG tablet TAKE  ONE TABLET BY MOUTH DAILY AS NEEDED FOR PAIN 01/15/24  Yes Gershon Donnice SAUNDERS, DPM  Menthol -Methyl Salicylate (SALONPAS PAIN RELIEF PATCH EX) Place 1 patch onto the skin daily as needed (pain.).   Yes [provider]  methocarbamol  (ROBAXIN ) 500 MG tablet Take 500 mg by mouth 3 (three) times daily. 08/02/21  Yes [provider]  pravastatin  (PRAVACHOL ) 20 MG tablet Take 1 tablet (20 mg total) by mouth daily. 04/02/24  Yes Levora Reyes SAUNDERS, MD  Tavaborole  (KERYDIN ) 5 % SOLN Apply 1 drop topically daily. Apply 1 drop to the toenail daily. 06/15/22  Yes Gershon Donnice SAUNDERS, DPM  triamcinolone  cream (KENALOG ) 0.1 % Apply 1 application topically 2 (two) times daily as needed. To affected areas only as needed. 01/08/19  Yes Levora Reyes SAUNDERS, MD  urea  (CARMOL) 40 % CREA Apply 1 Application topically daily. 10/31/22  Yes Gershon Donnice SAUNDERS, DPM   Social History   Socioeconomic History   Marital status: Married    Spouse name: Not on file   Number of children: Not on file   Years of education: Not on file   Highest education level: Not on file  Occupational History   Not on file  Tobacco Use   Smoking status: Former    Current packs/day: 0.00    Average packs/day: 1.5 packs/day for 20.0 years (30.0 ttl pk-yrs)    Types: Cigarettes    Start date: 11/05/1979    Quit date: 11/05/1999    Years since quitting: 24.4   Smokeless tobacco: Never  Vaping Use   Vaping status: Never Used  Substance and Sexual Activity   Alcohol use: No    Comment: last used 25 years ago    Drug use: Yes    Types: Cocaine    Comment: last used cocaine  and acid 25 yrs ago,none used since   Sexual activity: Not on file  Other Topics Concern   Not on file  Social History Narrative   Not on file   Social Drivers of Health   Financial Resource Strain: Low Risk  (07/02/2023)   Overall Financial Resource Strain (CARDIA)    Difficulty of Paying Living Expenses: Not hard at all  Food Insecurity: No Food  Insecurity (07/02/2023)   Hunger Vital Sign    Worried About Running Out of Food in the Last Year: Never true    Ran Out of Food in the Last Year: Never true  Transportation Needs: No Transportation Needs (07/02/2023)   PRAPARE - Administrator, Civil Service (Medical): No    Lack of Transportation (Non-Medical): No  Physical Activity: Insufficiently Active (07/02/2023)   Exercise Vital Sign    Days of Exercise per Week: 4 days    Minutes of Exercise per Session: 20 min  Stress: No Stress Concern Present (07/02/2023)   [employer] of Occupational Health - Occupational Stress Questionnaire    Feeling of Stress : Not at all  Social Connections: Moderately Isolated (07/02/2023)   Social Connection and Isolation Panel    Frequency of Communication with Friends and Family: Twice a week    Frequency of Social Gatherings with Friends and Family: Once a week    Attends Religious Services: Never    Database administrator or Organizations: No    Attends Banker Meetings: Never    Marital Status: Married  Catering manager Violence: Not At Risk (07/02/2023)   Humiliation, Afraid, Rape, and Kick questionnaire    Fear of Current or Ex-Partner: No    Emotionally Abused: No    Physically Abused: No    Sexually Abused: No    Review of Systems  Constitutional:  Negative for fatigue and unexpected weight change.  Eyes:  Negative for visual disturbance.  Respiratory:  Negative for cough, chest tightness and shortness of breath.   Cardiovascular:  Negative for chest pain, palpitations and leg swelling.  Gastrointestinal:  Negative for abdominal pain and blood in stool.  Neurological:  Negative for dizziness, light-headedness and headaches.     Objective:   Vitals:   04/07/24 1307  BP: 130/60  Pulse: 97  Temp: 99.3 F (37.4 C)  SpO2: 97%  Weight: 226 lb (102.5 kg)  Height: 6' (1.829 m)     Physical Exam Vitals reviewed.  Constitutional:       Appearance: He is well-developed.  HENT:     Head: Normocephalic and atraumatic.  Neck:     Vascular: No carotid bruit or JVD.  Cardiovascular:     Rate and Rhythm: Normal rate and regular rhythm.     Heart sounds: Normal heart sounds. No murmur heard. Pulmonary:     Effort: Pulmonary effort is normal.     Breath sounds: Normal breath sounds. No rales.  Musculoskeletal:     Right lower leg: No edema.     Left lower leg: No edema.  Skin:    General: Skin is warm and dry.  Neurological:     Mental Status: He is alert and oriented to person, place, and time.  Psychiatric:        Mood and Affect: Mood normal.        Assessment & Plan:  [name] is a 73 y.o. male . Essential hypertension - Plan: amLODipine  (NORVASC ) 5 MG tablet, losartan  (COZAAR ) 50 MG tablet   Stable, tolerating current regimen. Medications refilled. Labs pending as above.    Hyperlipidemia, unspecified hyperlipidemia type - Plan: pravastatin  (PRAVACHOL ) 20 MG tablet, Comprehensive metabolic panel with GFR, Lipid panel  -  Stable, tolerating current regimen. Medications refilled. Labs pending as above.   COPD, mild (HCC)  - Intermittent dosing of albuterol  with stable symptoms, follow-up with pulmonary if increased need to decide on maintenance therapy.  No changes for now  Heartburn - Plan: esomeprazole  (KLS ESOMEPRAZOLE  MAGNESIUM ) 20 MG capsule  - Stable with use of PPI, no changes.  Status post bilateral knee replacements Chronic joint pain  - Unfortunately  is having continued pain in his knees with multiple home treatments as above without success.  With previous knee replacements recommended he follow-up with orthopedics.  He plans to call to schedule with Dr. Emmit.  No new meds for now  Allergic rhinitis, unspecified seasonality, unspecified trigger Stable with use of nasal spray, continue same.  Meds ordered this encounter  Medications   amLODipine  (NORVASC ) 5 MG tablet    Sig: Take 1  tablet (5 mg total) by mouth daily.    Dispense:  90 tablet    Refill:  2   losartan  (COZAAR ) 50 MG tablet    Sig: Take 1 tablet (50 mg total) by mouth daily.    Dispense:  90 tablet    Refill:  2   esomeprazole  (KLS ESOMEPRAZOLE  MAGNESIUM ) 20 MG capsule    Sig: Take 1 capsule (20 mg total) by mouth daily.    Dispense:  84 capsule    Refill:  2   pravastatin  (PRAVACHOL ) 20 MG tablet    Sig: Take 1 tablet (20 mg total) by mouth daily.    Dispense:  90 tablet    Refill:  2   Patient Instructions  Good news on prior area of concern in the lungs!. Keep follow up with Dr. Shelah as planned.  Keep follow up with Dr. Alvaro for prostate testing and monitoring of areas on kidneys.  Call your orthopaedic doctor about your knee pain as prior replacements and continued pain in spite of the over the counter meds.    Thank you for coming in today. No change in medications at this time. If there are any concerns on your bloodwork, I will let you know. Take care!     Signed,   Reyes Pines, MD [facility], [facility] 04/07/24 1:40 PM

## 2024-04-08 ENCOUNTER — Ambulatory Visit: Payer: Self-pay | Admitting: Family Medicine

## 2024-04-29 ENCOUNTER — Ambulatory Visit: Payer: Self-pay

## 2024-04-29 NOTE — Telephone Encounter (Signed)
 Noted

## 2024-04-29 NOTE — Telephone Encounter (Signed)
 Appt to see you tomorrow

## 2024-04-29 NOTE — Telephone Encounter (Signed)
  FYI Only or Action Required?: FYI only for provider.  Patient was last seen in primary care on 04/07/2024 by Levora Reyes SAUNDERS, MD.  Called Nurse Triage reporting [name].  Symptoms began several days ago.  Interventions attempted: Nothing.  Symptoms are: stable.  Triage Disposition: See PCP When Office is Open (Within 3 Days)  Patient/caregiver understands and will follow disposition?: Yes                             Copied from CRM [id number]. Topic: Clinical - Red Word Triage >> Apr 29, 2024  2:00 PM [name] wrote: [facility] that prompted transfer to Nurse Triage: Patient has thrush again. He stated that it started Sunday. He has burning sensation on the tongue and it is hard to eat. Reason for Disposition  [1] White patches that stick to tongue or inner cheek AND [2] can be wiped off  Answer Assessment - Initial Assessment Questions 1. SYMPTOM: What's the main symptom you're concerned about? (e.g., chapped lips, dry mouth, lump, sores)     Oral thrush- tingling and white patches 2. ONSET: When did the symptoms start?     Sunday 3. PAIN: Is there any pain? If Yes, ask: How bad is it? (Scale: 0-10; or none, mild, moderate, severe)     Denies 4. CAUSE: What do you think is causing the symptoms?     Oral thrush 5. OTHER SYMPTOMS: Do you have any other symptoms? (e.g., fever, sore throat, toothache, swelling)     Throat dryness, denies difficulty breathing, denies difficulty swallowing, denies fever  Protocols used: Mouth Symptoms-A-AH

## 2024-04-30 ENCOUNTER — Encounter: Payer: Self-pay | Admitting: Family Medicine

## 2024-04-30 ENCOUNTER — Ambulatory Visit: Admitting: Family Medicine

## 2024-04-30 VITALS — BP 130/70 | HR 63 | Temp 99.3°F | Wt 226.0 lb

## 2024-04-30 DIAGNOSIS — B370 Candidal stomatitis: Secondary | ICD-10-CM | POA: Diagnosis not present

## 2024-04-30 MED ORDER — FLUCONAZOLE 200 MG PO TABS: 200.0000 mg | ORAL_TABLET | Freq: Every day | ORAL | 0 refills | Status: DC

## 2024-04-30 NOTE — Progress Notes (Signed)
 Subjective:  Patient ID: [name], male    DOB: 26-Jun-1951  Age: 73 y.o. MRN: [id number]  CC:  Chief Complaint  Patient presents with   tongue patches     Patient states white patches and tingling on tongue. Started Sunday. No fevers     HPI [name] presents for   Possible thrush, white patches on tongue Noted 3 days ago with some tingling/discomfort on tongue. Initially looked red, then yesterday today noticed the white patches.  Able to eat and drink, swallow normally.  No current sore throat, but sore few days ago.  No fevers. Some chronic dry mouth.  No recent corticosteroids.  Only inhaler - albuterol  - last used 4 days ago.  Wears dentures, cleans with overnight polident every other night.  Has dentist - last visit a year ago. Has not discussed thrush.   Previous history of thrush, treated multiple times last year.  Does have history of COPD with prior treatment with steroids, previously used Stiolto inhaler.  Due to mouth irritation with inhalers previously, pulmonary held off on maintenance bronchodilator, just albuterol  as needed unless increased need for albuterol .  Stable with every other day dosing of albuterol  when I saw him July 21.   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   Pulmonary nodules 02/23/2014   Tobacco use disorder, moderate, in sustained remission 02/23/2014   Recurrent right inguinal hernia 10/20/2013   ED (erectile dysfunction) 08/26/2012   Physical exam, annual 11/05/2011    HTN (hypertension) 11/05/2011   GERD (gastroesophageal reflux disease) 11/05/2011   Chronic joint pain 11/05/2011   Past Medical History:  Diagnosis Date   Allergy    seasonal   Arthritis    oa   Asthma    COPD (chronic obstructive pulmonary disease) (HCC)    GERD (gastroesophageal reflux disease)    Hypertension    Prostate cancer (HCC) 01/2016   prostatectomy    Shortness of breath dyspnea    Thrush    Past Surgical History:  Procedure Laterality Date   HERNIA REPAIR  last done 3-4 yrs ago   x 2   INGUINAL HERNIA REPAIR Right 11/20/2013   Procedure: OPEN REPAIR OF RECURRENT RIGHT INGUINAL HERNIA WITH MESH;  Surgeon: Donnice POUR. Belinda, MD;  Location: [facility];  Service: General;  Laterality: Right;   INSERTION OF MESH Right 11/20/2013   Procedure: INSERTION OF MESH;  Surgeon: Donnice POUR. Belinda, MD;  Location: [facility];  Service: General;  Laterality: Right;   KNEE SURGERY Left july 2014   mcl and meniscus repair   LYMPHADENECTOMY Bilateral 02/09/2016   Procedure: PELVIC LYMPHADENECTOMY;  Surgeon: Ricardo Likens, MD;  Location: [facility];  Service: Urology;  Laterality: Bilateral;   right knee arthroscopy      ROBOT ASSISTED LAPAROSCOPIC RADICAL PROSTATECTOMY N/A 02/09/2016   Procedure: XI ROBOTIC ASSISTED LAPAROSCOPIC RADICAL PROSTATECTOMY WITH INDOCYANINE GREEN DYE AND ADHESIOLYSIS;  Surgeon: Ricardo Likens, MD;  Location: [facility];  Service: Urology;  Laterality: N/A;  TOTAL KNEE ARTHROPLASTY Right 09/06/2015   Procedure: RIGHT TOTAL KNEE ARTHROPLASTY;  Surgeon: Donnice Car, MD;  Location: [facility];  Service: Orthopedics;  Laterality: Right;   TOTAL KNEE ARTHROPLASTY Left 06/17/2019   Procedure: TOTAL KNEE ARTHROPLASTY;  Surgeon: Car Donnice, MD;  Location: [facility];  Service: Orthopedics;  Laterality: Left;  70 mins   Allergies  Allergen Reactions   Percocet [Oxycodone -Acetaminophen ] Other (See Comments)    Nausea-Doesn't work   Amoxicillin  Rash    Has patient had a PCN reaction  causing immediate rash, facial/tongue/throat swelling, SOB or lightheadedness with hypotension: No Has patient had a PCN reaction causing severe rash involving mucus membranes or skin necrosis: No Has patient had a PCN reaction that required hospitalization No Has patient had a PCN reaction occurring within the last 10 years: Yes- 2016 If all of the above answers are NO, then may proceed with Cephalosporin use.    Prior to Admission medications   Medication Sig Start Date End Date Taking? Authorizing Provider  albuterol  (PROVENTIL  HFA) 108 (90 Base) MCG/ACT inhaler Proventil  HFA 90 mcg/actuation aerosol inhaler   2 puffs by inhalation route every 4-6 hours as needed. 01/10/24  Yes Shelah Lamar RAMAN, MD  amLODipine  (NORVASC ) 5 MG tablet Take 1 tablet (5 mg total) by mouth daily. 04/07/24  Yes Levora Reyes SAUNDERS, MD  azelastine  (OPTIVAR ) 0.05 % ophthalmic solution Place 1 drop into both eyes 2 (two) times daily as needed. For itching/allergy symptoms 07/14/19  Yes Levora Reyes SAUNDERS, MD  Cholecalciferol (VITAMIN D3) 50 MCG (2000 UT) TABS Take 2,000 Units by mouth daily.   Yes [provider]  clobetasol  cream (TEMOVATE ) 0.05 % Apply topically 2 (two) times daily. Apply topically to affected areas twice a day for no longer than two weeks 02/12/24  Yes Alm Delon SAILOR, DO  Clobetasol  Prop Emollient Base (CLOBETASOL  PROPIONATE E) 0.05 % emollient cream Apply 1 application topically at bedtime. 08/22/21  Yes Livingston Rigg, MD  esomeprazole  (KLS ESOMEPRAZOLE  MAGNESIUM ) 20 MG capsule Take 1 capsule (20 mg total) by mouth daily. 04/07/24  Yes Levora Reyes SAUNDERS, MD  KLS ALLER-FLO 50 MCG/ACT nasal spray INSTILL 2 SPRAYS INTO BOTH NOSTRILS ONCE DAILY 03/11/24  Yes Levora Reyes SAUNDERS, MD  losartan  (COZAAR ) 50 MG tablet Take 1 tablet (50 mg total) by mouth daily. 04/07/24  Yes Levora Reyes SAUNDERS, MD  meloxicam  (MOBIC ) 15 MG tablet TAKE ONE TABLET BY MOUTH DAILY AS NEEDED FOR PAIN 01/15/24  Yes Gershon Donnice SAUNDERS, DPM  Menthol -Methyl Salicylate (SALONPAS PAIN RELIEF PATCH EX) Place 1 patch onto the skin daily as needed (pain.).   Yes [provider]  pravastatin  (PRAVACHOL ) 20 MG tablet Take 1 tablet (20 mg total) by mouth daily. 04/07/24  Yes Levora Reyes SAUNDERS, MD  Tavaborole  (KERYDIN ) 5 % SOLN Apply 1 drop topically daily. Apply 1 drop to the toenail daily. 06/15/22  Yes Gershon Donnice SAUNDERS, DPM  urea  (CARMOL) 40 % CREA Apply 1 Application topically daily. 10/31/22  Yes Gershon Donnice SAUNDERS, DPM   Social History   Socioeconomic History   Marital status: Married    Spouse name: Not on file   Number of children: Not on file   Years of education: Not on file   Highest education level: Not on file  Occupational History   Not on file  Tobacco Use   Smoking status: Former    Current packs/day: 0.00    Average packs/day: 1.5 packs/day for 20.0 years (30.0 ttl pk-yrs)  Types: Cigarettes    Start date: 11/05/1979    Quit date: 11/05/1999    Years since quitting: 24.5   Smokeless tobacco: Never  Vaping Use   Vaping status: Never Used  Substance and Sexual Activity   Alcohol use: No    Comment: last used 25 years ago    Drug use: Yes    Types: Cocaine    Comment: last used cocaine  and acid 25 yrs ago,none used since   Sexual activity: Not on file  Other Topics Concern   Not on file  Social History Narrative   Not on file   Social Drivers of Health   Financial Resource Strain: Low Risk  (07/02/2023)   Overall Financial Resource Strain (CARDIA)    Difficulty of Paying Living Expenses: Not hard at all  Food Insecurity: No Food Insecurity (07/02/2023)   Hunger Vital Sign    Worried About Running Out of Food in the Last Year: Never true    Ran Out of Food in the Last Year: Never true  Transportation Needs: No Transportation Needs (07/02/2023)   PRAPARE - Administrator, Civil Service (Medical): No    Lack of Transportation (Non-Medical): No  Physical Activity:  Insufficiently Active (07/02/2023)   Exercise Vital Sign    Days of Exercise per Week: 4 days    Minutes of Exercise per Session: 20 min  Stress: No Stress Concern Present (07/02/2023)   [employer] of Occupational Health - Occupational Stress Questionnaire    Feeling of Stress : Not at all  Social Connections: Moderately Isolated (07/02/2023)   Social Connection and Isolation Panel    Frequency of Communication with Friends and Family: Twice a week    Frequency of Social Gatherings with Friends and Family: Once a week    Attends Religious Services: Never    Database administrator or Organizations: No    Attends Banker Meetings: Never    Marital Status: Married  Catering manager Violence: Not At Risk (07/02/2023)   Humiliation, Afraid, Rape, and Kick questionnaire    Fear of Current or Ex-Partner: No    Emotionally Abused: No    Physically Abused: No    Sexually Abused: No    Review of Systems   Objective:   Vitals:   04/30/24 1459  BP: 130/70  Pulse: 63  Temp: 99.3 F (37.4 C)  TempSrc: Temporal  SpO2: 97%  Weight: 226 lb (102.5 kg)     Physical Exam Vitals reviewed.  Constitutional:      Appearance: He is well-developed.  HENT:     Head: Normocephalic and atraumatic.     Mouth/Throat:     Mouth: Mucous membranes are moist.     Pharynx: Oropharyngeal exudate (Diffuse white patches across the tongue, buccal mucosa and posterior oropharynx.  Speaking normally, clearing secretions without difficulty.  No stridor) present.     Comments: Dentures in place Neck:     Vascular: No carotid bruit or JVD.  Cardiovascular:     Rate and Rhythm: Normal rate and regular rhythm.     Heart sounds: Normal heart sounds. No murmur heard. Pulmonary:     Effort: Pulmonary effort is normal.     Breath sounds: Normal breath sounds. No rales.  Musculoskeletal:     Right lower leg: No edema.     Left lower leg: No edema.  Skin:    General: Skin is warm and  dry.  Neurological:     Mental Status:  He is alert and oriented to person, place, and time.  Psychiatric:        Mood and Affect: Mood normal.        Assessment & Plan:  [name] is a 73 y.o. male . Oropharyngeal candidiasis - Plan: fluconazole  (DIFLUCAN ) 200 MG tablet Appears to be typical thrush but significant involvement on tongue, buccal mucosa and possibly into the posterior oropharynx.  Will treat for possible esophageal candidiasis with initial sore throat, 400 mg x 1 with Diflucan  and then 200 mg daily for 2 weeks.  Possible source of dentures and dry mouth as no longer on any inhalers that should be causing this and no recent corticosteroids.  If current symptoms do not resolve with treatment or recur, consider repeat infectious disease consult or ENT evaluation for biopsy.  RTC precautions given.  Meds ordered this encounter  Medications   fluconazole  (DIFLUCAN ) 200 MG tablet    Sig: Take 1 tablet (200 mg total) by mouth daily. Initial day 400mg , then 200mg  every day for 14 days.    Dispense:  15 tablet    Refill:  0   Patient Instructions  Recurrent thrush could be related to dry mouth and denture use.  With initial sore throat and the degree of involvement today I do think we should treat this for 2 weeks including at doses that would cover if there is any involvement in your throat.  Take 2 Diflucan  pills today, then 1 pill daily for 2 weeks.  Make sure to clean dentures thoroughly, chlorhexidine  solution may be best or can discuss with your dental provider.   Biotene can be used over-the-counter if needed for dry mouth.  If any increasing sore throat, difficulty swallowing, or other new/worsening symptoms to be seen right away.  Oral Thrush, Adult Oral thrush, also called oral candidiasis, is a fungal infection that develops in the mouth and throat and on the tongue. It causes white patches to form in the mouth and on the tongue. Many cases of thrush are mild, but  this infection can also be serious. [name] can be a repeated (recurrent) problem for certain people who have a weak body defense system (immune system). The weakness can be caused by chronic illnesses, or by taking medicines that limit the body's ability to fight infection. If a person has difficulty fighting infection, the fungus that causes thrush can spread through the body. This can cause life-threatening blood or organ infections. What are the causes? This condition is caused by a fungus (yeast) called Candida albicans. This fungus is normally present in small amounts in the mouth and on other mucous membranes. It usually causes no harm. If conditions are present that allow the fungus to grow without control, it invades surrounding tissues and becomes an infection. Other Candida species can also lead to thrush, though this is rare. What increases the risk? The following factors may make you more likely to develop this condition: Having a weakened immune system. Being an older adult. Having diabetes, cancer, or HIV (human immunodeficiency virus). Having dry mouth (xerostomia). Being pregnant or breastfeeding. Having poor dental care, especially in those who have dentures. Using antibiotic or steroid medicines. What are the signs or symptoms? Symptoms of this condition can vary from mild and moderate to severe and persistent. Symptoms may include: A burning feeling in the mouth and throat. This can occur at the start of a thrush infection. White patches that stick to the mouth and tongue. The tissue around the  patches may be red, raw, and painful. If rubbed (during tooth brushing, for example), the patches and the tissue of the mouth may bleed easily. A bad taste in the mouth or difficulty tasting foods. A cottony feeling in the mouth. Pain during eating and swallowing. Poor appetite. Cracking at the corners of the mouth. How is this diagnosed? This condition is diagnosed based on: A  physical exam. Your medical history. How is this treated? This condition is treated with medicines called antifungals, which prevent the growth of fungi. These medicines are either applied directly to the affected area (topical) or swallowed (oral). The treatment will depend on the severity of the condition. Mild cases of thrush may be treated with an antifungal mouth rinse or lozenges. Treatment usually lasts about 14 days. Moderate to severe cases of thrush can be treated with oral antifungal medicine, if they have spread to the esophagus. A topical antifungal medicine may also be used. For some severe infections, treatment may need to continue for more than 14 days. Oral antifungal medicines are rarely used during pregnancy because they may be harmful to the unborn child. If you are pregnant, talk with your health care provider about options for treatment. Persistent or recurrent thrush. For cases of thrush that do not go away or keep coming back: Treatment may be needed twice as long as the symptoms last. Treatment will include both oral and topical antifungal medicines. People with a weakened immune system can take an antifungal medicine on a continuous basis to prevent thrush infections. It is important to treat conditions that make a person more likely to get thrush, such as diabetes or HIV. Follow these instructions at home: Relieving soreness and discomfort To help reduce the discomfort of thrush: Drink cold liquids such as water  or iced tea. Try flavored ice treats or frozen juices. Eat foods that are easy to swallow, such as gelatin, ice cream, or custard. Try drinking from a straw if the patches in your mouth are painful.  General instructions Take or use over-the-counter and prescription medicines only as told by your health care provider. Eat plain, unflavored yogurt as directed by your health care provider. Check the label to make sure the yogurt contains live cultures. This  yogurt can help healthy bacteria grow in the mouth and can stop the growth of the fungus that causes thrush. If you wear dentures, remove the dentures before going to bed, brush them vigorously, and soak them in a cleaning solution as directed by your health care provider. Rinse your mouth with a warm salt-water  mixture several times a day. To make a salt-water  mixture, dissolve -1 tsp (3-6 g) of salt in 1 cup (237 mL) of warm water . Contact a health care provider if: Your symptoms are getting worse or are not improving within 7 days of starting treatment. You have symptoms of a spreading infection, such as white patches on the skin outside of the mouth. You are breastfeeding your baby and you have redness and pain in the nipples. Summary Oral thrush, also called oral candidiasis, is a fungal infection that develops in the mouth and throat and on the tongue. It causes white patches to form in the mouth and on the tongue. You are more likely to get this condition if you have a weakened immune system or an underlying condition, such as HIV, cancer, or diabetes. This condition is treated with medicines called antifungals, which prevent the growth of fungi. Contact a health care provider if your symptoms do  not improve, or get worse, within 7 days of starting treatment. This information is not intended to replace advice given to you by your health care provider. Make sure you discuss any questions you have with your health care provider. Document Revised: 08/21/2022 Document Reviewed: 08/21/2022 Elsevier Patient Education  2024 [employer].    Signed,   Reyes Pines, MD [facility], [facility] 04/30/24 3:34 PM

## 2024-04-30 NOTE — Patient Instructions (Addendum)
 Recurrent thrush could be related to dry mouth and denture use.  With initial sore throat and the degree of involvement today I do think we should treat this for 2 weeks including at doses that would cover if there is any involvement in your throat.  Take 2 Diflucan  pills today, then 1 pill daily for 2 weeks.  Make sure to clean dentures thoroughly, chlorhexidine  solution may be best or can discuss with your dental provider.   Biotene can be used over-the-counter if needed for dry mouth.  If any increasing sore throat, difficulty swallowing, or other new/worsening symptoms to be seen right away.  Oral Thrush, Adult Oral thrush, also called oral candidiasis, is a fungal infection that develops in the mouth and throat and on the tongue. It causes white patches to form in the mouth and on the tongue. Many cases of thrush are mild, but this infection can also be serious. [name] can be a repeated (recurrent) problem for certain people who have a weak body defense system (immune system). The weakness can be caused by chronic illnesses, or by taking medicines that limit the body's ability to fight infection. If a person has difficulty fighting infection, the fungus that causes thrush can spread through the body. This can cause life-threatening blood or organ infections. What are the causes? This condition is caused by a fungus (yeast) called Candida albicans. This fungus is normally present in small amounts in the mouth and on other mucous membranes. It usually causes no harm. If conditions are present that allow the fungus to grow without control, it invades surrounding tissues and becomes an infection. Other Candida species can also lead to thrush, though this is rare. What increases the risk? The following factors may make you more likely to develop this condition: Having a weakened immune system. Being an older adult. Having diabetes, cancer, or HIV (human immunodeficiency virus). Having dry mouth  (xerostomia). Being pregnant or breastfeeding. Having poor dental care, especially in those who have dentures. Using antibiotic or steroid medicines. What are the signs or symptoms? Symptoms of this condition can vary from mild and moderate to severe and persistent. Symptoms may include: A burning feeling in the mouth and throat. This can occur at the start of a thrush infection. White patches that stick to the mouth and tongue. The tissue around the patches may be red, raw, and painful. If rubbed (during tooth brushing, for example), the patches and the tissue of the mouth may bleed easily. A bad taste in the mouth or difficulty tasting foods. A cottony feeling in the mouth. Pain during eating and swallowing. Poor appetite. Cracking at the corners of the mouth. How is this diagnosed? This condition is diagnosed based on: A physical exam. Your medical history. How is this treated? This condition is treated with medicines called antifungals, which prevent the growth of fungi. These medicines are either applied directly to the affected area (topical) or swallowed (oral). The treatment will depend on the severity of the condition. Mild cases of thrush may be treated with an antifungal mouth rinse or lozenges. Treatment usually lasts about 14 days. Moderate to severe cases of thrush can be treated with oral antifungal medicine, if they have spread to the esophagus. A topical antifungal medicine may also be used. For some severe infections, treatment may need to continue for more than 14 days. Oral antifungal medicines are rarely used during pregnancy because they may be harmful to the unborn child. If you are pregnant, talk with your  health care provider about options for treatment. Persistent or recurrent thrush. For cases of thrush that do not go away or keep coming back: Treatment may be needed twice as long as the symptoms last. Treatment will include both oral and topical antifungal  medicines. People with a weakened immune system can take an antifungal medicine on a continuous basis to prevent thrush infections. It is important to treat conditions that make a person more likely to get thrush, such as diabetes or HIV. Follow these instructions at home: Relieving soreness and discomfort To help reduce the discomfort of thrush: Drink cold liquids such as water  or iced tea. Try flavored ice treats or frozen juices. Eat foods that are easy to swallow, such as gelatin, ice cream, or custard. Try drinking from a straw if the patches in your mouth are painful.  General instructions Take or use over-the-counter and prescription medicines only as told by your health care provider. Eat plain, unflavored yogurt as directed by your health care provider. Check the label to make sure the yogurt contains live cultures. This yogurt can help healthy bacteria grow in the mouth and can stop the growth of the fungus that causes thrush. If you wear dentures, remove the dentures before going to bed, brush them vigorously, and soak them in a cleaning solution as directed by your health care provider. Rinse your mouth with a warm salt-water  mixture several times a day. To make a salt-water  mixture, dissolve -1 tsp (3-6 g) of salt in 1 cup (237 mL) of warm water . Contact a health care provider if: Your symptoms are getting worse or are not improving within 7 days of starting treatment. You have symptoms of a spreading infection, such as white patches on the skin outside of the mouth. You are breastfeeding your baby and you have redness and pain in the nipples. Summary Oral thrush, also called oral candidiasis, is a fungal infection that develops in the mouth and throat and on the tongue. It causes white patches to form in the mouth and on the tongue. You are more likely to get this condition if you have a weakened immune system or an underlying condition, such as HIV, cancer, or diabetes. This  condition is treated with medicines called antifungals, which prevent the growth of fungi. Contact a health care provider if your symptoms do not improve, or get worse, within 7 days of starting treatment. This information is not intended to replace advice given to you by your health care provider. Make sure you discuss any questions you have with your health care provider. Document Revised: 08/21/2022 Document Reviewed: 08/21/2022 Elsevier Patient Education  2024 [employer].

## 2024-05-09 ENCOUNTER — Emergency Department (HOSPITAL_COMMUNITY)

## 2024-05-09 ENCOUNTER — Encounter (HOSPITAL_COMMUNITY): Payer: Self-pay | Admitting: Emergency Medicine

## 2024-05-09 ENCOUNTER — Other Ambulatory Visit: Payer: Self-pay

## 2024-05-09 ENCOUNTER — Emergency Department (HOSPITAL_COMMUNITY)
Admission: EM | Admit: 2024-05-09 | Discharge: 2024-05-09 | Disposition: A | Discharge status: Home / Self Care | Attending: Emergency Medicine | Admitting: Emergency Medicine

## 2024-05-09 DIAGNOSIS — I10 Essential (primary) hypertension: Secondary | ICD-10-CM | POA: Diagnosis not present

## 2024-05-09 DIAGNOSIS — M25552 Pain in left hip: Secondary | ICD-10-CM | POA: Diagnosis present

## 2024-05-09 DIAGNOSIS — W108XXA Fall (on) (from) other stairs and steps, initial encounter: Secondary | ICD-10-CM | POA: Diagnosis not present

## 2024-05-09 DIAGNOSIS — W19XXXA Unspecified fall, initial encounter: Secondary | ICD-10-CM

## 2024-05-09 DIAGNOSIS — J449 Chronic obstructive pulmonary disease, unspecified: Secondary | ICD-10-CM | POA: Insufficient documentation

## 2024-05-09 DIAGNOSIS — Z79899 Other long term (current) drug therapy: Secondary | ICD-10-CM | POA: Diagnosis not present

## 2024-05-09 DIAGNOSIS — M25562 Pain in left knee: Secondary | ICD-10-CM | POA: Diagnosis not present

## 2024-05-09 DIAGNOSIS — Z8546 Personal history of malignant neoplasm of prostate: Secondary | ICD-10-CM | POA: Insufficient documentation

## 2024-05-09 MED ORDER — HYDROCODONE-ACETAMINOPHEN 5-325 MG PO TABS: 1.0000 | ORAL_TABLET | ORAL | 0 refills | Status: AC | PRN

## 2024-05-09 MED ORDER — HYDROCODONE-ACETAMINOPHEN 5-325 MG PO TABS
2.0000 | ORAL_TABLET | Freq: Once | ORAL | Status: AC
  Administered 2024-05-09: 2 via ORAL
  Filled 2024-05-09: qty 2

## 2024-05-09 NOTE — Discharge Instructions (Signed)
Tylenol and ibuprofen as discussed below.  Please use Tylenol or ibuprofen for pain.  You may use 600 mg ibuprofen every 6 hours or 1000 mg of Tylenol every 6 hours.  You may choose to alternate between the 2.  This would be most effective.  Not to exceed 4 g of Tylenol within 24 hours.  Not to exceed 3200 mg ibuprofen 24 hours.

## 2024-05-09 NOTE — ED Triage Notes (Signed)
 Pt fell down 3 steps yesterday afternoon. Left side hip and knee pain. Painful to move.

## 2024-05-09 NOTE — ED Provider Notes (Signed)
 [place] EMERGENCY DEPARTMENT AT [facility] Provider Note   CSN: [id number] Arrival date & time: 05/09/24  9167     Patient presents with: Fall and Hip Pain   [name] is a 73 y.o. male.    Fall  Hip Pain   Patient is a 73 year old male past medical history significant for reflux, COPD, prostate cancer, hypertension  Patient presents emergency room today after he fell to the ground yesterday afternoon and slid down 3 steps.  He states that he fell onto his left side and has pain in his left hip and left knee.  He states that hurts to walk but he has been able to ambulate without significant difficulty.  He is limping somewhat.  Denies any head injury or loss of consciousness no neck pain or nausea or vomiting.   He describes this as a mechanical fall states that he tripped over his feet.    Prior to Admission medications   Medication Sig Start Date End Date Taking? Authorizing Provider  HYDROcodone -acetaminophen  (NORCO/VICODIN) 5-325 MG tablet Take 1 tablet by mouth every 4 (four) hours as needed. 05/09/24  Yes Bowen Kia S, PA  albuterol  (PROVENTIL  HFA) 108 (90 Base) MCG/ACT inhaler Proventil  HFA 90 mcg/actuation aerosol inhaler   2 puffs by inhalation route every 4-6 hours as needed. 01/10/24   Shelah Lamar RAMAN, MD  amLODipine  (NORVASC ) 5 MG tablet Take 1 tablet (5 mg total) by mouth daily. 04/07/24   Levora Reyes SAUNDERS, MD  azelastine  (OPTIVAR ) 0.05 % ophthalmic solution Place 1 drop into both eyes 2 (two) times daily as needed. For itching/allergy symptoms 07/14/19   Levora Reyes SAUNDERS, MD  Cholecalciferol (VITAMIN D3) 50 MCG (2000 UT) TABS Take 2,000 Units by mouth daily.    [provider]  clobetasol  cream (TEMOVATE ) 0.05 % Apply topically 2 (two) times daily. Apply topically to affected areas twice a day for no longer than two weeks 02/12/24   Alm Delon SAILOR, DO  Clobetasol  Prop Emollient Base (CLOBETASOL  PROPIONATE E) 0.05 % emollient cream  Apply 1 application topically at bedtime. 08/22/21   Livingston Rigg, MD  esomeprazole  (KLS ESOMEPRAZOLE  MAGNESIUM ) 20 MG capsule Take 1 capsule (20 mg total) by mouth daily. 04/07/24   Levora Reyes SAUNDERS, MD  fluconazole  (DIFLUCAN ) 200 MG tablet Take 1 tablet (200 mg total) by mouth daily. Initial day 400mg , then 200mg  every day for 14 days. 04/30/24   Greene, Jeffrey R, MD  KLS ALLER-FLO 50 MCG/ACT nasal spray INSTILL 2 SPRAYS INTO BOTH NOSTRILS ONCE DAILY 03/11/24   Levora Reyes SAUNDERS, MD  losartan  (COZAAR ) 50 MG tablet Take 1 tablet (50 mg total) by mouth daily. 04/07/24   Levora Reyes SAUNDERS, MD  meloxicam  (MOBIC ) 15 MG tablet TAKE ONE TABLET BY MOUTH DAILY AS NEEDED FOR PAIN 01/15/24   Gershon Donnice SAUNDERS, DPM  Menthol -Methyl Salicylate (SALONPAS PAIN RELIEF PATCH EX) Place 1 patch onto the skin daily as needed (pain.).    [provider]  pravastatin  (PRAVACHOL ) 20 MG tablet Take 1 tablet (20 mg total) by mouth daily. 04/07/24   Levora Reyes SAUNDERS, MD  Tavaborole  (KERYDIN ) 5 % SOLN Apply 1 drop topically daily. Apply 1 drop to the toenail daily. 06/15/22   Gershon Donnice SAUNDERS, DPM  urea  (CARMOL) 40 % CREA Apply 1 Application topically daily. 10/31/22   Gershon Donnice SAUNDERS, DPM    Allergies: Percocet [oxycodone -acetaminophen ] and Amoxicillin     Review of Systems  Updated Vital Signs BP (!) 150/90 (BP Location:  Right Arm)   Pulse 75   Temp 98.3 F (36.8 C) (Oral)   Resp 16   Ht 6' (1.829 m)   Wt 102.1 kg   SpO2 94%   BMI 30.52 kg/m   Physical Exam Vitals and nursing note reviewed.  Constitutional:      General: He is not in acute distress. HENT:     Head: Normocephalic and atraumatic.     Nose: Nose normal.     Mouth/Throat:     Mouth: Mucous membranes are moist.  Eyes:     General: No scleral icterus. Cardiovascular:     Rate and Rhythm: Normal rate and regular rhythm.     Pulses: Normal pulses.     Heart sounds: Normal heart sounds.  Pulmonary:     Effort: Pulmonary effort  is normal. No respiratory distress.     Breath sounds: No wheezing.  Abdominal:     Palpations: Abdomen is soft.     Tenderness: There is no abdominal tenderness. There is no guarding or rebound.  Musculoskeletal:     Cervical back: Normal range of motion.     Right lower leg: No edema.     Left lower leg: No edema.     Comments: Tenderness to palpation of left hip and diffuse tenderness to palpation of left knee.  No palpable fluid in left knee, negative ballottement.  No right lower extremity tenderness, upper extremities bilaterally are without tenderness bruising deformity.  Bilateral grip strength normal    Skin:    General: Skin is warm and dry.     Capillary Refill: Capillary refill takes less than 2 seconds.  Neurological:     Mental Status: He is alert. Mental status is at baseline.  Psychiatric:        Mood and Affect: Mood normal.        Behavior: Behavior normal.     (all labs ordered are listed, but only abnormal results are displayed) Labs Reviewed - No data to display  EKG: None  Radiology: DG Knee Complete 4 Views Left Result Date: 05/09/2024 CLINICAL DATA:  73 year old male status post fall down stairs yesterday. Continued left hip and knee pain. Painful range of motion. EXAM: LEFT KNEE - COMPLETE 4+ VIEW COMPARISON:  Left knee series 01/07/2013. FINDINGS: Four views. Interval left total knee arthroplasty. Hardware components appear intact, aligned. Postoperative changes to the undersurface of the patella. Underlying chronic patello femoral compartment degeneration. No joint effusion identified. No acute osseous abnormality identified. IMPRESSION: Left total knee arthroplasty with no acute fracture or dislocation identified. Electronically Signed   By: VEAR Hurst M.D.   On: 05/09/2024 09:39   DG Hip Unilat With Pelvis 2-3 Views Left Result Date: 05/09/2024 CLINICAL DATA:  73 year old male status post fall down stairs yesterday. Continued left hip and knee pain.  Painful range of motion. EXAM: DG HIP (WITH OR WITHOUT PELVIS) 2-3V LEFT COMPARISON:  CT Abdomen and Pelvis 06/28/2023. FINDINGS: Stable bone mineralization, course throughout the left hemipelvis and thought to be Paget's disease related on prior CTs. Femoral heads remain normally located. No acute pelvis fracture identified. Symmetric SI joints. Grossly intact proximal right femur. Proximal left femur appears intact on 2 dedicated views. Negative lower abdominal and pelvic visceral contours. IMPRESSION: 1. No acute fracture or dislocation identified about the left hip or pelvis. 2. Paget's disease of the Left hemipelvis. Electronically Signed   By: VEAR Hurst M.D.   On: 05/09/2024 09:38     Procedures  Medications Ordered in the ED  HYDROcodone -acetaminophen  (NORCO/VICODIN) 5-325 MG per tablet 2 tablet (2 tablets Oral Given 05/09/24 0910)                                    Medical Decision Making Amount and/or Complexity of Data Reviewed Radiology: ordered.  Risk Prescription drug management.   Patient is a 73 year old male past medical history significant for reflux, COPD, prostate cancer, hypertension  Patient presents emergency room today after he fell to the ground yesterday afternoon and slid down 3 steps.  He states that he fell onto his left side and has pain in his left hip and left knee.  He states that hurts to walk but he has been able to ambulate without significant difficulty.  He is limping somewhat.  Denies any head injury or loss of consciousness no neck pain or nausea or vomiting.   He describes this as a mechanical fall states that he tripped over his feet.  Left knee and left hip and pelvis without fracture.  He does have some bony changes consistent with Paget's disease.  I recommend that he follow-up with his primary care doctor to discuss this further.  Offered patient CT imaging of hip to rule out occult fracture however patient states that he feels well walking  currently especially after the Norco that he received here.  Will follow-up with his primary care doctor.  Will discharge home with 5 tablets of Norco for breakthrough pain and recommend Tylenol  ibuprofen  and close outpatient follow-up.  Final diagnoses:  Left hip pain  Fall, initial encounter    ED Discharge Orders          Ordered    HYDROcodone -acetaminophen  (NORCO/VICODIN) 5-325 MG tablet  Every 4 hours PRN        05/09/24 1023               Neldon Inoue [place], [place] 05/09/24 1326    Dasie Faden, MD 05/09/24 1431

## 2024-06-06 ENCOUNTER — Ambulatory Visit: Payer: Self-pay

## 2024-06-06 DIAGNOSIS — B370 Candidal stomatitis: Secondary | ICD-10-CM

## 2024-06-06 MED ORDER — FLUCONAZOLE 200 MG PO TABS: 200.0000 mg | ORAL_TABLET | Freq: Every day | ORAL | 0 refills | Status: DC

## 2024-06-06 NOTE — Telephone Encounter (Signed)
 Called and left VM for patient about medication Diflucan  to [employer].

## 2024-06-06 NOTE — Addendum Note (Signed)
 Addended by: Chieko Neises R on: 06/06/2024 11:49 AM   Modules accepted: Orders

## 2024-06-06 NOTE — Telephone Encounter (Signed)
 This RN attempted to contact patient. Patient did not answer. LVM. Will route to office for follow up on this medication request.   Copied from CRM #[id number]. Topic: Clinical - Medication Question >> Jun 06, 2024  8:12 AM Suzen RAMAN wrote: Reason for CRM: Patient would like to know if he can be prescribed the same medication for his tongue thrush. (Patient not sure of the name of the medication) and have it sent to [id number] - [place], [place] - [address]    Okay to leave a VM if unable to reach patient.

## 2024-06-06 NOTE — Telephone Encounter (Signed)
 Scheduled patient a visit for Monday to eval thrush in mouth

## 2024-06-06 NOTE — Telephone Encounter (Signed)
 Recent visit for oropharyngeal candidiasis on August 13.  Treated with Diflucan  for 2-week course at that time.  Will repeat order, but please have him keep follow-up on Monday as planned.  Prescription printed, please fax to pharmacy.  Placed in fax bin at back nurse station.

## 2024-06-09 ENCOUNTER — Ambulatory Visit: Admitting: Family Medicine

## 2024-06-09 ENCOUNTER — Encounter: Payer: Self-pay | Admitting: Family Medicine

## 2024-06-09 VITALS — BP 130/78 | HR 78 | Resp 18 | Ht 72.0 in | Wt 222.0 lb

## 2024-06-09 DIAGNOSIS — B370 Candidal stomatitis: Secondary | ICD-10-CM | POA: Diagnosis not present

## 2024-06-09 DIAGNOSIS — R0989 Other specified symptoms and signs involving the circulatory and respiratory systems: Secondary | ICD-10-CM | POA: Diagnosis not present

## 2024-06-09 NOTE — Patient Instructions (Signed)
 Thanks for coming in today.  Continue Diflucan  for suspected thrush.  Glad to hear that is improving.  If any return of symptoms after completion of this course of antifungal, let me know.  I did place a referral to ear nose and throat specialist to evaluate for other testing and to discuss the intermittent choking sensation.  If that worsens or becomes more frequent I need to know right away.  Keep me posted.

## 2024-06-09 NOTE — Progress Notes (Signed)
 Subjective:  Patient ID: [name], male    DOB: 1951-04-04  Age: 73 y.o. MRN: [id number]  CC:  Chief Complaint  Patient presents with   [name]    Recent diagnosis of thrush; cleared up and symptoms returned on Friday    HPI [name] presents for   Thrush: Treated 8/13 - diflucan  200mg x1, then 100mg  every day for 2 weeks. White patches cleared 3-4 days into treatment. Completed entire 2 week course. Return of tongue burning/stinging 4 days ago, saw white patches 3 days ago - similar to thrush prior - I sent in refill of diflucan  - started 3 days ago. Feel like getting better.  Choking sensation with swallowing at times - noted 2 weeks ago. About the same. No vomiting. Notes with drinking fluids. Not every time - few times only. No difficulty with swallowing food. Drinking liquids ok at this time. Still taking diflucan .  No fever, weight loss or night sweats.  No neck masses/swelling.  No recent abx.  Cleans dentures nightly. Fixodent.   recurrent thrush prior, with use of inhaler. Off inhalers except albuterol  only as needed - 3-4 times per week. Has been followed by pulmonary - Dr. Shelah. Appt 11/19.  ID consult on 08/27/2023  for recurrent thrush. Thought some possible oral irritation form Stiolto. If recurrent - possible ENT consult for possible biopsy.    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   Pulmonary nodules 02/23/2014   Tobacco use disorder, moderate, in sustained remission 02/23/2014    Recurrent right inguinal hernia 10/20/2013   ED (erectile dysfunction) 08/26/2012   Physical exam, annual 11/05/2011   HTN (hypertension) 11/05/2011   GERD (gastroesophageal reflux disease) 11/05/2011   Chronic joint pain 11/05/2011   Past Medical History:  Diagnosis Date   Allergy    seasonal   Arthritis    oa   Asthma    COPD (chronic obstructive pulmonary disease) (HCC)    GERD (gastroesophageal reflux disease)    Hypertension    Prostate cancer (HCC) 01/2016   prostatectomy    Shortness of breath dyspnea    Thrush    Past Surgical History:  Procedure Laterality Date   HERNIA REPAIR  last done 3-4 yrs ago   x 2   INGUINAL HERNIA REPAIR Right 11/20/2013   Procedure: OPEN REPAIR OF RECURRENT RIGHT INGUINAL HERNIA WITH MESH;  Surgeon: Donnice POUR. Belinda, MD;  Location: [facility];  Service: General;  Laterality: Right;   INSERTION OF MESH Right 11/20/2013   Procedure: INSERTION OF MESH;  Surgeon: Donnice POUR. Belinda, MD;  Location: [facility];  Service: General;  Laterality: Right;   KNEE SURGERY Left july 2014   mcl and meniscus repair   LYMPHADENECTOMY Bilateral 02/09/2016   Procedure: PELVIC LYMPHADENECTOMY;  Surgeon: Ricardo Likens, MD;  Location: [facility];  Service: Urology;  Laterality: Bilateral;   right knee arthroscopy      ROBOT ASSISTED LAPAROSCOPIC RADICAL PROSTATECTOMY N/A 02/09/2016   Procedure: XI ROBOTIC ASSISTED LAPAROSCOPIC  RADICAL PROSTATECTOMY WITH INDOCYANINE GREEN DYE AND ADHESIOLYSIS;  Surgeon: Ricardo Likens, MD;  Location: [facility];  Service: Urology;  Laterality: N/A;   TOTAL KNEE ARTHROPLASTY Right 09/06/2015   Procedure: RIGHT TOTAL KNEE ARTHROPLASTY;  Surgeon: Donnice Car, MD;  Location: [facility];  Service: Orthopedics;  Laterality: Right;   TOTAL KNEE ARTHROPLASTY Left 06/17/2019   Procedure: TOTAL KNEE ARTHROPLASTY;  Surgeon: Car Donnice, MD;  Location: [facility];  Service: Orthopedics;  Laterality: Left;  70 mins   Allergies  Allergen Reactions   Percocet  [Oxycodone -Acetaminophen ] Other (See Comments)    Nausea-Doesn't work   Amoxicillin  Rash    Has patient had a PCN reaction causing immediate rash, facial/tongue/throat swelling, SOB or lightheadedness with hypotension: No Has patient had a PCN reaction causing severe rash involving mucus membranes or skin necrosis: No Has patient had a PCN reaction that required hospitalization No Has patient had a PCN reaction occurring within the last 10 years: Yes- 2016 If all of the above answers are NO, then may proceed with Cephalosporin use.    Prior to Admission medications   Medication Sig Start Date End Date Taking? Authorizing Provider  albuterol  (PROVENTIL  HFA) 108 (90 Base) MCG/ACT inhaler Proventil  HFA 90 mcg/actuation aerosol inhaler   2 puffs by inhalation route every 4-6 hours as needed. 01/10/24  Yes Shelah Lamar RAMAN, MD  amLODipine  (NORVASC ) 5 MG tablet Take 1 tablet (5 mg total) by mouth daily. 04/07/24  Yes Levora Reyes SAUNDERS, MD  azelastine  (OPTIVAR ) 0.05 % ophthalmic solution Place 1 drop into both eyes 2 (two) times daily as needed. For itching/allergy symptoms 07/14/19  Yes Levora Reyes SAUNDERS, MD  Cholecalciferol (VITAMIN D3) 50 MCG (2000 UT) TABS Take 2,000 Units by mouth daily.   Yes [provider]  clobetasol  cream (TEMOVATE ) 0.05 % Apply topically 2 (two) times daily. Apply topically to affected areas twice a day for no longer than two weeks 02/12/24  Yes Alm Delon SAILOR, DO  Clobetasol  Prop Emollient Base (CLOBETASOL  PROPIONATE E) 0.05 % emollient cream Apply 1 application topically at bedtime. 08/22/21  Yes Livingston Rigg, MD  esomeprazole  (KLS ESOMEPRAZOLE  MAGNESIUM ) 20 MG capsule Take 1 capsule (20 mg total) by mouth daily. 04/07/24  Yes Levora Reyes SAUNDERS, MD  fluconazole  (DIFLUCAN ) 200 MG tablet Take 1 tablet (200 mg total) by mouth daily. Initial day 400mg , then 200mg  every day for 14 days. 06/06/24  Yes Levora Reyes SAUNDERS, MD  HYDROcodone -acetaminophen  (NORCO/VICODIN) 5-325  MG tablet Take 1 tablet by mouth every 4 (four) hours as needed. 05/09/24  Yes Fondaw, Wylder S, PA  KLS ALLER-FLO 50 MCG/ACT nasal spray INSTILL 2 SPRAYS INTO BOTH NOSTRILS ONCE DAILY 03/11/24  Yes Levora Reyes SAUNDERS, MD  losartan  (COZAAR ) 50 MG tablet Take 1 tablet (50 mg total) by mouth daily. 04/07/24  Yes Levora Reyes SAUNDERS, MD  meloxicam  (MOBIC ) 15 MG tablet TAKE ONE TABLET BY MOUTH DAILY AS NEEDED FOR PAIN 01/15/24  Yes Gershon Donnice SAUNDERS, DPM  Menthol -Methyl Salicylate (SALONPAS PAIN RELIEF PATCH EX) Place 1 patch onto the skin daily as needed (pain.).   Yes [provider]  pravastatin  (PRAVACHOL ) 20 MG tablet Take 1 tablet (20 mg total) by mouth daily. 04/07/24  Yes Levora Reyes SAUNDERS, MD  Tavaborole  (KERYDIN ) 5 % SOLN Apply 1 drop topically daily. Apply 1 drop to the toenail daily. 06/15/22  Yes Gershon Donnice SAUNDERS, DPM  urea  (CARMOL) 40 % CREA Apply 1 Application topically daily. 10/31/22  Yes Gershon Donnice SAUNDERS, DPM  Social History   Socioeconomic History   Marital status: Married    Spouse name: Not on file   Number of children: Not on file   Years of education: Not on file   Highest education level: Not on file  Occupational History   Not on file  Tobacco Use   Smoking status: Former    Current packs/day: 0.00    Average packs/day: 1.5 packs/day for 20.0 years (30.0 ttl pk-yrs)    Types: Cigarettes    Start date: 11/05/1979    Quit date: 11/05/1999    Years since quitting: 24.6   Smokeless tobacco: Never  Vaping Use   Vaping status: Never Used  Substance and Sexual Activity   Alcohol use: No    Comment: last used 25 years ago    Drug use: Yes    Types: Cocaine    Comment: last used cocaine  and acid 25 yrs ago,none used since   Sexual activity: Not on file  Other Topics Concern   Not on file  Social History Narrative   Not on file   Social Drivers of Health   Financial Resource Strain: Low Risk  (07/02/2023)   Overall Financial Resource Strain (CARDIA)     Difficulty of Paying Living Expenses: Not hard at all  Food Insecurity: No Food Insecurity (07/02/2023)   Hunger Vital Sign    Worried About Running Out of Food in the Last Year: Never true    Ran Out of Food in the Last Year: Never true  Transportation Needs: No Transportation Needs (07/02/2023)   PRAPARE - Administrator, Civil Service (Medical): No    Lack of Transportation (Non-Medical): No  Physical Activity: Insufficiently Active (07/02/2023)   Exercise Vital Sign    Days of Exercise per Week: 4 days    Minutes of Exercise per Session: 20 min  Stress: No Stress Concern Present (07/02/2023)   [employer] of Occupational Health - Occupational Stress Questionnaire    Feeling of Stress : Not at all  Social Connections: Moderately Isolated (07/02/2023)   Social Connection and Isolation Panel    Frequency of Communication with Friends and Family: Twice a week    Frequency of Social Gatherings with Friends and Family: Once a week    Attends Religious Services: Never    Database administrator or Organizations: No    Attends Banker Meetings: Never    Marital Status: Married  Catering manager Violence: Not At Risk (07/02/2023)   Humiliation, Afraid, Rape, and Kick questionnaire    Fear of Current or Ex-Partner: No    Emotionally Abused: No    Physically Abused: No    Sexually Abused: No    Review of Systems  Per HPI Objective:   Vitals:   06/09/24 1130  BP: 130/78  Pulse: 78  Resp: 18  SpO2: 96%  Weight: 222 lb (100.7 kg)  Height: 6' (1.829 m)     Physical Exam Vitals reviewed.  Constitutional:      Appearance: He is well-developed.  HENT:     Head: Normocephalic and atraumatic.     Mouth/Throat:     Comments: See photo, white patches diffusely across top of tongue but no buccal mucosa patches noted or lesions.  No posterior oropharynx involvement appreciated.  Clearing secretions without difficulty, no stridor. Neck:     Vascular:  No carotid bruit or JVD.  Cardiovascular:     Rate and Rhythm: Normal rate and regular rhythm.  Heart sounds: Normal heart sounds. No murmur heard. Pulmonary:     Effort: Pulmonary effort is normal.     Breath sounds: Normal breath sounds. No stridor. No wheezing or rales.  Musculoskeletal:     Right lower leg: No edema.     Left lower leg: No edema.  Skin:    General: Skin is warm and dry.  Neurological:     Mental Status: He is alert and oriented to person, place, and time.  Psychiatric:        Mood and Affect: Mood normal.       Assessment & Plan:  [name] is a 73 y.o. male . Thrush, oral - Plan: Ambulatory referral to ENT  Choking sensation - Plan: Ambulatory referral to ENT Recurrent thrush, previous evaluation by infectious disease.  Possible irritation from previous inhaler but he is not using Stiolto any longer.  Potentially could have had recurrent thrush previously as well as irritation.  Recent suspected thrush treated with 2-week course of Diflucan  with resolution of symptoms, then recurred 4 days ago.  Improving with current course of Diflucan , but given this recurrence, previous recommendations from infectious disease will refer him to ENT to consider possible biopsy.  Additionally he reported intermittent choking sensation with liquids but infrequent, clearing liquids, pills and swallowing foods without difficulty at this time.  ER/RTC precautions, ENT eval as above.  No orders of the defined types were placed in this encounter.  Patient Instructions  Thanks for coming in today.  Continue Diflucan  for suspected thrush.  Glad to hear that is improving.  If any return of symptoms after completion of this course of antifungal, let me know.  I did place a referral to ear nose and throat specialist to evaluate for other testing and to discuss the intermittent choking sensation.  If that worsens or becomes more frequent I need to know right away.  Keep me  posted.     Signed,   Reyes Pines, MD [facility], [facility] 06/09/24 12:34 PM

## 2024-06-23 ENCOUNTER — Ambulatory Visit: Admitting: Dermatology

## 2024-07-11 ENCOUNTER — Ambulatory Visit: Payer: Self-pay

## 2024-07-11 NOTE — Telephone Encounter (Signed)
 Called patient to advise being seen at [facility]. Dr. Levora has left for the day but I will pass along message to him. Left vm to return call

## 2024-07-11 NOTE — Telephone Encounter (Signed)
 Patient called back. He is fine with going to UC to get medications.

## 2024-07-11 NOTE — Telephone Encounter (Signed)
 FYI Only or Action Required?: Action required by provider: clinical question for provider and update on patient condition.  Patient was last seen in primary care on 06/09/2024 by Levora Reyes SAUNDERS, MD.  Called Nurse Triage reporting [name].  Symptoms began yesterday.  Interventions attempted: Nothing.  Symptoms are: gradually worsening.  Triage Disposition: See PCP When Office is Open (Within 3 Days)  Patient/caregiver understands and will follow disposition?: No, wishes to speak with PCP       Reason for Triage: Patient stating that thrush has returned since last visit. Wanting to get another prescription called in before the weekend, his tongue is raw        Reason for Disposition  [1] White patches that stick to tongue or inner cheek AND [2] can be wiped off  Answer Assessment - Initial Assessment Questions Patient states he has an ENT appointment on 11/3 and wanted to get another prescription for his thrush to help his symptoms until that time. Please advise.       1. ONSET: When did your mouth start hurting? (e.g., hours or days ago)      Yesterday  2. SEVERITY: How bad is the pain? (Scale 1-10; or mild, moderate or severe)     Mild  3. SORES: Are there any sores or ulcers in the mouth? If Yes, ask: What part of the mouth are the sores in?     White patches visible  4. FEVER: Do you have a fever? If Yes, ask: What is your temperature, how was it measured, and when did it start?     No 5. CAUSE: What do you think is causing the mouth pain?     Thrush  6. OTHER SYMPTOMS: Do you have any other symptoms? (e.g., difficulty breathing)     No  Protocols used: Mouth Pain-A-AH

## 2024-07-14 ENCOUNTER — Encounter: Payer: Self-pay | Admitting: Family Medicine

## 2024-07-14 ENCOUNTER — Ambulatory Visit: Admitting: Family Medicine

## 2024-07-14 DIAGNOSIS — B370 Candidal stomatitis: Secondary | ICD-10-CM

## 2024-07-14 MED ORDER — FLUCONAZOLE 200 MG PO TABS: 200.0000 mg | ORAL_TABLET | Freq: Every day | ORAL | 0 refills | Status: AC

## 2024-07-14 NOTE — Telephone Encounter (Signed)
 Called patient and scheduled him today at 11:40 with Dr.Greene

## 2024-07-14 NOTE — Telephone Encounter (Signed)
 Copied from CRM [id number]. Topic: Clinical - Medical Advice >> Jul 14, 2024 10:09 AM Ashley R wrote: Reason for CRM: thrush coming back, would like to know what Dr advises - Rx or wait until appt with ENT on 11/3. Attempted to contact twice last week.   Callback [phone]

## 2024-07-14 NOTE — Patient Instructions (Signed)
 Keep follow-up with ear nose and throat doctor next week.  Start Diflucan  for suspected repeat thrush.  Avoid any sour candies, and avoid Jolly rancher's for now.  I would also recommend hot sauce or spicy foods for now but that would be less likely the cause.  As we discussed something is contributing to recurrent thrush.  Avoiding some of the possible contributors above may be helpful.  Staying hydrated, there are some other medicines over-the-counter that can help with dry mouth if that continues.  Let me know.  Hopefully will have some additional information after you meet with ENT on Monday.  If any worsening symptoms let me know.  Hang in there.  Oral Thrush, Adult Oral thrush, also called oral candidiasis, is a fungal infection that develops in the mouth and throat and on the tongue. It causes white patches to form in the mouth and on the tongue. Many cases of thrush are mild, but this infection can also be serious. [name] can be a repeated (recurrent) problem for certain people who have a weak body defense system (immune system). The weakness can be caused by chronic illnesses, or by taking medicines that limit the body's ability to fight infection. If a person has difficulty fighting infection, the fungus that causes thrush can spread through the body. This can cause life-threatening blood or organ infections. What are the causes? This condition is caused by a fungus (yeast) called Candida albicans. This fungus is normally present in small amounts in the mouth and on other mucous membranes. It usually causes no harm. If conditions are present that allow the fungus to grow without control, it invades surrounding tissues and becomes an infection. Other Candida species can also lead to thrush, though this is rare. What increases the risk? The following factors may make you more likely to develop this condition: Having a weakened immune system. Being an older adult. Having diabetes, cancer, or HIV  (human immunodeficiency virus). Having dry mouth (xerostomia). Being pregnant or breastfeeding. Having poor dental care, especially in those who have dentures. Using antibiotic or steroid medicines. What are the signs or symptoms? Symptoms of this condition can vary from mild and moderate to severe and persistent. Symptoms may include: A burning feeling in the mouth and throat. This can occur at the start of a thrush infection. White patches that stick to the mouth and tongue. The tissue around the patches may be red, raw, and painful. If rubbed (during tooth brushing, for example), the patches and the tissue of the mouth may bleed easily. A bad taste in the mouth or difficulty tasting foods. A cottony feeling in the mouth. Pain during eating and swallowing. Poor appetite. Cracking at the corners of the mouth. How is this diagnosed? This condition is diagnosed based on: A physical exam. Your medical history. How is this treated? This condition is treated with medicines called antifungals, which prevent the growth of fungi. These medicines are either applied directly to the affected area (topical) or swallowed (oral). The treatment will depend on the severity of the condition. Mild cases of thrush may be treated with an antifungal mouth rinse or lozenges. Treatment usually lasts about 14 days. Moderate to severe cases of thrush can be treated with oral antifungal medicine, if they have spread to the esophagus. A topical antifungal medicine may also be used. For some severe infections, treatment may need to continue for more than 14 days. Oral antifungal medicines are rarely used during pregnancy because they may be harmful to  the unborn child. If you are pregnant, talk with your health care provider about options for treatment. Persistent or recurrent thrush. For cases of thrush that do not go away or keep coming back: Treatment may be needed twice as long as the symptoms last. Treatment will  include both oral and topical antifungal medicines. People with a weakened immune system can take an antifungal medicine on a continuous basis to prevent thrush infections. It is important to treat conditions that make a person more likely to get thrush, such as diabetes or HIV. Follow these instructions at home: Relieving soreness and discomfort To help reduce the discomfort of thrush: Drink cold liquids such as water  or iced tea. Try flavored ice treats or frozen juices. Eat foods that are easy to swallow, such as gelatin, ice cream, or custard. Try drinking from a straw if the patches in your mouth are painful.  General instructions Take or use over-the-counter and prescription medicines only as told by your health care provider. Eat plain, unflavored yogurt as directed by your health care provider. Check the label to make sure the yogurt contains live cultures. This yogurt can help healthy bacteria grow in the mouth and can stop the growth of the fungus that causes thrush. If you wear dentures, remove the dentures before going to bed, brush them vigorously, and soak them in a cleaning solution as directed by your health care provider. Rinse your mouth with a warm salt-water  mixture several times a day. To make a salt-water  mixture, dissolve -1 tsp (3-6 g) of salt in 1 cup (237 mL) of warm water . Contact a health care provider if: Your symptoms are getting worse or are not improving within 7 days of starting treatment. You have symptoms of a spreading infection, such as white patches on the skin outside of the mouth. You are breastfeeding your baby and you have redness and pain in the nipples. Summary Oral thrush, also called oral candidiasis, is a fungal infection that develops in the mouth and throat and on the tongue. It causes white patches to form in the mouth and on the tongue. You are more likely to get this condition if you have a weakened immune system or an underlying condition,  such as HIV, cancer, or diabetes. This condition is treated with medicines called antifungals, which prevent the growth of fungi. Contact a health care provider if your symptoms do not improve, or get worse, within 7 days of starting treatment. This information is not intended to replace advice given to you by your health care provider. Make sure you discuss any questions you have with your health care provider. Document Revised: 08/21/2022 Document Reviewed: 08/21/2022 Elsevier Patient Education  2024 [employer].

## 2024-07-14 NOTE — Telephone Encounter (Signed)
 Noted, if he has not been seen by urgent care I do have some openings today if needed.

## 2024-07-14 NOTE — Progress Notes (Signed)
 Subjective:  Patient ID: CLEOPHUS MENDONSA, male    DOB: May 13, 1951  Age: 73 y.o. MRN: [id number]  CC:  Chief Complaint  Patient presents with   [name] patches on tongue. Sx started Thursday and got worse Friday.     HPI WIRT HEMMERICH presents for   Suspected thrush.  He has been treated multiple times for oropharyngeal candidiasis.  Has also been evaluated by infectious disease in December of last year with some possible oral irritation from [facility] with plan to ENT consult for possible biopsy if recurrent.  I last saw him September 22, had been treated in August with Diflucan , white patches cleared 3 to 4 days into treatment, completed the entire 2-week course and then return of tongue burning, stinging approximately September 18.  White patches started the 19th.  Similar to previous thrush.  Symptoms were improving after few days of starting Diflucan .  No difficulty with swallowing food but did have some choking sensation with swallowing times, denied fever weight loss night sweats neck masses swelling or recent antibiotics.  He does wear dentures and did note that he cleans those nightly with Polident.  Although he did have some improvement with use of Diflucan  each time, I did refer him to ENT to consider possible biopsy.  He will be seeing Dr. Greggory on November 3.  Nurse call note reviewed from 07/11/2024.  Symptoms returned the day prior.  Felt like thrush coming back.  Option of urgent care eval, but worked into appointment today.  Stinging started 3 days ago. White patches day prior. No pain with swallowing. No fever/wt loss/night sweats.  Cleaning dentures nightly, leaves out overnight.  Does eat Chrystie lent candy 2-3 per day - past few months.  No inhalers.  Some dry mouth during the day - drinking water , some energy drink 2 per day.       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   Pulmonary nodules 02/23/2014   Tobacco use disorder, moderate, in sustained remission 02/23/2014   Recurrent right inguinal hernia 10/20/2013   ED (erectile dysfunction) 08/26/2012   Physical exam, annual 11/05/2011   HTN (hypertension) 11/05/2011   GERD (gastroesophageal reflux disease) 11/05/2011   Chronic joint pain 11/05/2011   Past Medical History:  Diagnosis Date   Allergy    seasonal   Arthritis    oa   Asthma    COPD (chronic obstructive pulmonary disease) (HCC)    GERD (gastroesophageal reflux disease)    Hypertension    Prostate cancer (HCC) 01/2016   prostatectomy    Shortness of breath dyspnea    Thrush    Past Surgical History:  Procedure Laterality Date   HERNIA REPAIR  last done 3-4 yrs ago   x 2   INGUINAL HERNIA REPAIR Right 11/20/2013   Procedure: OPEN REPAIR OF RECURRENT RIGHT INGUINAL HERNIA WITH MESH;  Surgeon: Donnice POUR. Belinda, MD;  Location: [facility];  Service: General;  Laterality: Right;  INSERTION OF MESH Right 11/20/2013   Procedure: INSERTION OF MESH;  Surgeon: Donnice POUR. Belinda, MD;  Location: [facility];  Service: General;  Laterality: Right;   KNEE SURGERY Left july 2014   mcl and meniscus repair   LYMPHADENECTOMY Bilateral 02/09/2016   Procedure: PELVIC LYMPHADENECTOMY;  Surgeon: Ricardo Likens, MD;  Location: [facility];  Service: Urology;  Laterality: Bilateral;   right knee arthroscopy      ROBOT ASSISTED LAPAROSCOPIC RADICAL PROSTATECTOMY N/A 02/09/2016   Procedure: XI ROBOTIC ASSISTED LAPAROSCOPIC RADICAL PROSTATECTOMY WITH INDOCYANINE GREEN DYE AND  ADHESIOLYSIS;  Surgeon: Ricardo Likens, MD;  Location: [facility];  Service: Urology;  Laterality: N/A;   TOTAL KNEE ARTHROPLASTY Right 09/06/2015   Procedure: RIGHT TOTAL KNEE ARTHROPLASTY;  Surgeon: Donnice Car, MD;  Location: [facility];  Service: Orthopedics;  Laterality: Right;   TOTAL KNEE ARTHROPLASTY Left 06/17/2019   Procedure: TOTAL KNEE ARTHROPLASTY;  Surgeon: Car Donnice, MD;  Location: [facility];  Service: Orthopedics;  Laterality: Left;  70 mins   Allergies  Allergen Reactions   Percocet [Oxycodone -Acetaminophen ] Other (See Comments)    Nausea-Doesn't work   Amoxicillin  Rash    Has patient had a PCN reaction causing immediate rash, facial/tongue/throat swelling, SOB or lightheadedness with hypotension: No Has patient had a PCN reaction causing severe rash involving mucus membranes or skin necrosis: No Has patient had a PCN reaction that required hospitalization No Has patient had a PCN reaction occurring within the last 10 years: Yes- 2016 If all of the above answers are NO, then may proceed with Cephalosporin use.    Prior to Admission medications   Medication Sig Start Date End Date Taking? Authorizing Provider  albuterol  (PROVENTIL  HFA) 108 (90 Base) MCG/ACT inhaler Proventil  HFA 90 mcg/actuation aerosol inhaler   2 puffs by inhalation route every 4-6 hours as needed. 01/10/24  Yes Shelah Lamar RAMAN, MD  amLODipine  (NORVASC ) 5 MG tablet Take 1 tablet (5 mg total) by mouth daily. 04/07/24  Yes Levora Reyes SAUNDERS, MD  azelastine  (OPTIVAR ) 0.05 % ophthalmic solution Place 1 drop into both eyes 2 (two) times daily as needed. For itching/allergy symptoms 07/14/19  Yes Levora Reyes SAUNDERS, MD  celecoxib  (CELEBREX ) 200 MG capsule Take 200 mg by mouth daily. 06/26/24  Yes [provider]  Cholecalciferol (VITAMIN D3) 50 MCG (2000 UT) TABS Take 2,000 Units by mouth daily.   Yes [provider]  clobetasol  cream (TEMOVATE ) 0.05 % Apply topically 2 (two) times daily. Apply topically  to affected areas twice a day for no longer than two weeks 02/12/24  Yes Alm Delon SAILOR, DO  Clobetasol  Prop Emollient Base (CLOBETASOL  PROPIONATE E) 0.05 % emollient cream Apply 1 application topically at bedtime. 08/22/21  Yes Livingston Rigg, MD  esomeprazole  (KLS ESOMEPRAZOLE  MAGNESIUM ) 20 MG capsule Take 1 capsule (20 mg total) by mouth daily. 04/07/24  Yes Levora Reyes SAUNDERS, MD  HYDROcodone -acetaminophen  (NORCO/VICODIN) 5-325 MG tablet Take 1 tablet by mouth every 4 (four) hours as needed. 05/09/24  Yes Fondaw, Wylder S, PA  KLS ALLER-FLO 50 MCG/ACT nasal spray INSTILL 2 SPRAYS INTO BOTH NOSTRILS ONCE DAILY 03/11/24  Yes Levora Reyes SAUNDERS, MD  losartan  (COZAAR ) 50 MG tablet Take 1 tablet (50 mg total) by mouth daily. 04/07/24  Yes Levora Reyes SAUNDERS, MD  meloxicam  (MOBIC ) 15 MG tablet TAKE ONE TABLET BY MOUTH DAILY AS NEEDED FOR PAIN 01/15/24  Yes Gershon Donnice SAUNDERS, DPM  Menthol -Methyl Salicylate (SALONPAS PAIN RELIEF PATCH EX) Place 1 patch onto the skin daily as  needed (pain.).   Yes [provider]  pravastatin  (PRAVACHOL ) 20 MG tablet Take 1 tablet (20 mg total) by mouth daily. 04/07/24  Yes Levora Reyes SAUNDERS, MD  Tavaborole  (KERYDIN ) 5 % SOLN Apply 1 drop topically daily. Apply 1 drop to the toenail daily. 06/15/22  Yes Gershon Donnice SAUNDERS, DPM  urea  (CARMOL) 40 % CREA Apply 1 Application topically daily. 10/31/22  Yes Gershon Donnice SAUNDERS, DPM  fluconazole  (DIFLUCAN ) 200 MG tablet Take 1 tablet (200 mg total) by mouth daily. Initial day 400mg , then 200mg  every day for 14 days. Patient not taking: Reported on 07/14/2024 06/06/24   Levora Reyes SAUNDERS, MD   Social History   Socioeconomic History   Marital status: Married    Spouse name: Not on file   Number of children: Not on file   Years of education: Not on file   Highest education level: Not on file  Occupational History   Not on file  Tobacco Use   Smoking status: Former    Current packs/day: 0.00    Average packs/day: 1.5  packs/day for 20.0 years (30.0 ttl pk-yrs)    Types: Cigarettes    Start date: 11/05/1979    Quit date: 11/05/1999    Years since quitting: 24.7   Smokeless tobacco: Never  Vaping Use   Vaping status: Never Used  Substance and Sexual Activity   Alcohol use: No    Comment: last used 25 years ago    Drug use: Yes    Types: Cocaine    Comment: last used cocaine  and acid 25 yrs ago,none used since   Sexual activity: Not on file  Other Topics Concern   Not on file  Social History Narrative   Not on file   Social Drivers of Health   Financial Resource Strain: Low Risk  (07/02/2023)   Overall Financial Resource Strain (CARDIA)    Difficulty of Paying Living Expenses: Not hard at all  Food Insecurity: No Food Insecurity (07/02/2023)   Hunger Vital Sign    Worried About Running Out of Food in the Last Year: Never true    Ran Out of Food in the Last Year: Never true  Transportation Needs: No Transportation Needs (07/02/2023)   PRAPARE - Administrator, Civil Service (Medical): No    Lack of Transportation (Non-Medical): No  Physical Activity: Insufficiently Active (07/02/2023)   Exercise Vital Sign    Days of Exercise per Week: 4 days    Minutes of Exercise per Session: 20 min  Stress: No Stress Concern Present (07/02/2023)   [employer] of Occupational Health - Occupational Stress Questionnaire    Feeling of Stress : Not at all  Social Connections: Moderately Isolated (07/02/2023)   Social Connection and Isolation Panel    Frequency of Communication with Friends and Family: Twice a week    Frequency of Social Gatherings with Friends and Family: Once a week    Attends Religious Services: Never    Database Administrator or Organizations: No    Attends Banker Meetings: Never    Marital Status: Married  Catering Manager Violence: Not At Risk (07/02/2023)   Humiliation, Afraid, Rape, and Kick questionnaire    Fear of Current or Ex-Partner: No     Emotionally Abused: No    Physically Abused: No    Sexually Abused: No    Review of Systems Per HPI  Objective:   Vitals:   07/14/24 1144  BP: (!) 142/86  Pulse:  77  Resp: 18  Temp: 99 F (37.2 C)  TempSrc: Temporal  SpO2: 97%  Weight: 224 lb (101.6 kg)  Height: 6' (1.829 m)     Physical Exam Vitals reviewed.  Constitutional:      Appearance: He is well-developed.  HENT:     Head: Normocephalic and atraumatic.     Mouth/Throat:     Comments: White patches across dorsum of tongue, I do not see any buccal mucosal involvement or posterior oropharyngeal involvement.  See photo. Neck:     Vascular: No carotid bruit or JVD.  Cardiovascular:     Rate and Rhythm: Normal rate and regular rhythm.     Heart sounds: Normal heart sounds. No murmur heard. Pulmonary:     Effort: Pulmonary effort is normal.     Breath sounds: Normal breath sounds. No stridor. No wheezing or rales.  Musculoskeletal:     Cervical back: No rigidity.  Skin:    General: Skin is warm and dry.  Neurological:     Mental Status: He is alert and oriented to person, place, and time.  Psychiatric:        Mood and Affect: Mood normal.         Assessment & Plan:  MARIA GALLICCHIO is a 73 y.o. male . Oropharyngeal candidiasis - Plan: fluconazole  (DIFLUCAN ) 200 MG tablet Recurrent symptoms, but does improve with Diflucan .  He is no longer on any inhalers.  Question whether the candies, sour candies he is using may be contributing.  Advised to avoid those for now.  Dry mouth may also be contributing.  Has follow-up in 1 week with ENT, if persistent white patch at that time could consider biopsy to rule out leukoplakia.  Restart Diflucan  as above with RTC precautions.  Meds ordered this encounter  Medications   fluconazole  (DIFLUCAN ) 200 MG tablet    Sig: Take 1 tablet (200 mg total) by mouth daily for 7 days. Initial dose 400mg  (2 tablets).    Dispense:  8 tablet    Refill:  0   Patient Instructions   Keep follow-up with ear nose and throat doctor next week.  Start Diflucan  for suspected repeat thrush.  Avoid any sour candies, and avoid Jolly rancher's for now.  I would also recommend hot sauce or spicy foods for now but that would be less likely the cause.  As we discussed something is contributing to recurrent thrush.  Avoiding some of the possible contributors above may be helpful.  Staying hydrated, there are some other medicines over-the-counter that can help with dry mouth if that continues.  Let me know.  Hopefully will have some additional information after you meet with ENT on Monday.  If any worsening symptoms let me know.  Hang in there.  Oral Thrush, Adult Oral thrush, also called oral candidiasis, is a fungal infection that develops in the mouth and throat and on the tongue. It causes white patches to form in the mouth and on the tongue. Many cases of thrush are mild, but this infection can also be serious. [name] can be a repeated (recurrent) problem for certain people who have a weak body defense system (immune system). The weakness can be caused by chronic illnesses, or by taking medicines that limit the body's ability to fight infection. If a person has difficulty fighting infection, the fungus that causes thrush can spread through the body. This can cause life-threatening blood or organ infections. What are the causes? This condition is caused by a  fungus (yeast) called Candida albicans. This fungus is normally present in small amounts in the mouth and on other mucous membranes. It usually causes no harm. If conditions are present that allow the fungus to grow without control, it invades surrounding tissues and becomes an infection. Other Candida species can also lead to thrush, though this is rare. What increases the risk? The following factors may make you more likely to develop this condition: Having a weakened immune system. Being an older adult. Having diabetes, cancer, or  HIV (human immunodeficiency virus). Having dry mouth (xerostomia). Being pregnant or breastfeeding. Having poor dental care, especially in those who have dentures. Using antibiotic or steroid medicines. What are the signs or symptoms? Symptoms of this condition can vary from mild and moderate to severe and persistent. Symptoms may include: A burning feeling in the mouth and throat. This can occur at the start of a thrush infection. White patches that stick to the mouth and tongue. The tissue around the patches may be red, raw, and painful. If rubbed (during tooth brushing, for example), the patches and the tissue of the mouth may bleed easily. A bad taste in the mouth or difficulty tasting foods. A cottony feeling in the mouth. Pain during eating and swallowing. Poor appetite. Cracking at the corners of the mouth. How is this diagnosed? This condition is diagnosed based on: A physical exam. Your medical history. How is this treated? This condition is treated with medicines called antifungals, which prevent the growth of fungi. These medicines are either applied directly to the affected area (topical) or swallowed (oral). The treatment will depend on the severity of the condition. Mild cases of thrush may be treated with an antifungal mouth rinse or lozenges. Treatment usually lasts about 14 days. Moderate to severe cases of thrush can be treated with oral antifungal medicine, if they have spread to the esophagus. A topical antifungal medicine may also be used. For some severe infections, treatment may need to continue for more than 14 days. Oral antifungal medicines are rarely used during pregnancy because they may be harmful to the unborn child. If you are pregnant, talk with your health care provider about options for treatment. Persistent or recurrent thrush. For cases of thrush that do not go away or keep coming back: Treatment may be needed twice as long as the symptoms last. Treatment  will include both oral and topical antifungal medicines. People with a weakened immune system can take an antifungal medicine on a continuous basis to prevent thrush infections. It is important to treat conditions that make a person more likely to get thrush, such as diabetes or HIV. Follow these instructions at home: Relieving soreness and discomfort To help reduce the discomfort of thrush: Drink cold liquids such as water  or iced tea. Try flavored ice treats or frozen juices. Eat foods that are easy to swallow, such as gelatin, ice cream, or custard. Try drinking from a straw if the patches in your mouth are painful.  General instructions Take or use over-the-counter and prescription medicines only as told by your health care provider. Eat plain, unflavored yogurt as directed by your health care provider. Check the label to make sure the yogurt contains live cultures. This yogurt can help healthy bacteria grow in the mouth and can stop the growth of the fungus that causes thrush. If you wear dentures, remove the dentures before going to bed, brush them vigorously, and soak them in a cleaning solution as directed by your health care provider.  Rinse your mouth with a warm salt-water  mixture several times a day. To make a salt-water  mixture, dissolve -1 tsp (3-6 g) of salt in 1 cup (237 mL) of warm water . Contact a health care provider if: Your symptoms are getting worse or are not improving within 7 days of starting treatment. You have symptoms of a spreading infection, such as white patches on the skin outside of the mouth. You are breastfeeding your baby and you have redness and pain in the nipples. Summary Oral thrush, also called oral candidiasis, is a fungal infection that develops in the mouth and throat and on the tongue. It causes white patches to form in the mouth and on the tongue. You are more likely to get this condition if you have a weakened immune system or an underlying  condition, such as HIV, cancer, or diabetes. This condition is treated with medicines called antifungals, which prevent the growth of fungi. Contact a health care provider if your symptoms do not improve, or get worse, within 7 days of starting treatment. This information is not intended to replace advice given to you by your health care provider. Make sure you discuss any questions you have with your health care provider. Document Revised: 08/21/2022 Document Reviewed: 08/21/2022 Elsevier Patient Education  2024 Elsevier Inc.    Signed,   Reyes Pines, MD [facility], [facility] 07/14/24 12:30 PM

## 2024-07-21 ENCOUNTER — Telehealth (INDEPENDENT_AMBULATORY_CARE_PROVIDER_SITE_OTHER): Payer: Self-pay

## 2024-07-21 ENCOUNTER — Encounter (INDEPENDENT_AMBULATORY_CARE_PROVIDER_SITE_OTHER): Payer: Self-pay

## 2024-07-21 ENCOUNTER — Institutional Professional Consult (permissible substitution) (INDEPENDENT_AMBULATORY_CARE_PROVIDER_SITE_OTHER): Admitting: Otolaryngology

## 2024-07-21 ENCOUNTER — Ambulatory Visit (INDEPENDENT_AMBULATORY_CARE_PROVIDER_SITE_OTHER)

## 2024-07-21 VITALS — BP 155/79 | HR 86

## 2024-07-21 DIAGNOSIS — B370 Candidal stomatitis: Secondary | ICD-10-CM

## 2024-07-21 DIAGNOSIS — H938X2 Other specified disorders of left ear: Secondary | ICD-10-CM

## 2024-07-21 MED ORDER — NYSTATIN 100000 UNIT/ML MT SUSP: 5.0000 mL | Freq: Three times a day (TID) | OROMUCOSAL | 0 refills | Status: AC

## 2024-07-21 MED ORDER — NYSTATIN 100000 UNIT/ML MT SUSP: 5.0000 mL | Freq: Three times a day (TID) | OROMUCOSAL | 0 refills | Status: DC

## 2024-07-21 NOTE — Addendum Note (Signed)
 Addended by: Mindel Friscia on: 07/21/2024 04:07 PM   Modules accepted: Orders

## 2024-07-21 NOTE — Telephone Encounter (Signed)
 Patient called regarding medicine.  Please call.  [phone]

## 2024-07-21 NOTE — Progress Notes (Signed)
 Dear Dr. Levora, Here is my assessment for our mutual patient, [name]. Thank you for allowing me the opportunity to care for your patient. Please do not hesitate to contact me should you have any other questions. Sincerely, Dr. Hadassah Parody  Otolaryngology Clinic Note Referring provider: Dr. Levora HPI:   Initial HPI (07/21/2024) Discussed the use of AI scribe software for clinical note transcription with the patient, who gave verbal consent to proceed.  History of Present Illness [name] is a 73 year old male who presents with recurrent white patches on the tongue.  Oral mucosal lesions - Recurrent white patches on the tongue, increasing in frequency from two to three times per year last year to seven times this year - Stinging sensation when eating and tingling sensation when not eating - No pain, burning, or voice changes - Completed a week-long course of Diflucan  today with only slight improvement; patches return within weeks after treatment - No use of nystatin  or other prescription mouthwashes - Inquires about a large taste bud at the back of the tongue and black knots under the tongue  Otologic symptoms - Sensation of something in the left ear - No ear pain - History of wax buildup in ears  Tobacco use history - Quit smoking 20 years ago after a 17 year history of smoking    Independent Review of Additional Tests or Records:  Referral note 07/14/24 Pedro Levora, MD: treated multiple times for candidiasis. Sometimes clear with diflucan . Sometimes has choking sensation. Uses dentures. Consider biopsy of leukoplakia    PMH/Meds/All/SocHx/FamHx/ROS:   Past Medical History:  Diagnosis Date   Allergy    seasonal   Arthritis    oa   Asthma    COPD (chronic obstructive pulmonary disease) (HCC)    GERD (gastroesophageal reflux disease)    Hypertension    Prostate cancer (HCC) 01/2016   prostatectomy    Shortness of breath dyspnea     Thrush      Past Surgical History:  Procedure Laterality Date   HERNIA REPAIR  last done 3-4 yrs ago   x 2   INGUINAL HERNIA REPAIR Right 11/20/2013   Procedure: OPEN REPAIR OF RECURRENT RIGHT INGUINAL HERNIA WITH MESH;  Surgeon: Donnice POUR. Belinda, MD;  Location: [facility];  Service: General;  Laterality: Right;   INSERTION OF MESH Right 11/20/2013   Procedure: INSERTION OF MESH;  Surgeon: Donnice POUR. Belinda, MD;  Location: [facility];  Service: General;  Laterality: Right;   KNEE SURGERY Left july 2014   mcl and meniscus repair   LYMPHADENECTOMY Bilateral 02/09/2016   Procedure: PELVIC LYMPHADENECTOMY;  Surgeon: Ricardo Likens, MD;  Location: [facility];  Service: Urology;  Laterality: Bilateral;   right knee arthroscopy      ROBOT ASSISTED LAPAROSCOPIC RADICAL PROSTATECTOMY N/A 02/09/2016   Procedure: XI ROBOTIC ASSISTED LAPAROSCOPIC RADICAL PROSTATECTOMY WITH INDOCYANINE GREEN DYE AND ADHESIOLYSIS;  Surgeon: Ricardo Likens, MD;  Location: [facility];  Service: Urology;  Laterality: N/A;   TOTAL KNEE ARTHROPLASTY Right 09/06/2015   Procedure: RIGHT TOTAL KNEE ARTHROPLASTY;  Surgeon: Donnice Car, MD;  Location: [facility];  Service: Orthopedics;  Laterality: Right;   TOTAL KNEE ARTHROPLASTY Left 06/17/2019   Procedure: TOTAL KNEE ARTHROPLASTY;  Surgeon: Car Donnice, MD;  Location: [facility];  Service: Orthopedics;  Laterality: Left;  70 mins    Family History  Problem Relation Age of Onset   Cancer Mother    Cancer Father    Cancer Sister  Arthritis Sister    Cancer Maternal Grandmother    Heart disease Maternal Grandfather    Stroke Maternal Grandfather      Social Connections: Moderately Isolated (07/02/2023)   Social Connection and Isolation Panel    Frequency of Communication with Friends and Family: Twice a week    Frequency of Social Gatherings with Friends and Family: Once a week    Attends Religious Services: Never    Database Administrator or Organizations: No    Attends Hospital Doctor: Never    Marital Status: Married     Current Outpatient Medications  Medication Instructions   albuterol  (PROVENTIL  HFA) 108 (90 Base) MCG/ACT inhaler Proventil  HFA 90 mcg/actuation aerosol inhaler   2 puffs by inhalation route every 4-6 hours as needed.   amLODipine  (NORVASC ) 5 mg, Oral, Daily   azelastine  (OPTIVAR ) 0.05 % ophthalmic solution 1 drop, Both Eyes, 2 times daily PRN, For itching/allergy symptoms   celecoxib  (CELEBREX ) 200 mg, Daily   clobetasol  cream (TEMOVATE ) 0.05 % Topical, 2 times daily, Apply topically to affected areas twice a day for no longer than two weeks   Clobetasol  Prop Emollient Base (CLOBETASOL  PROPIONATE E) 0.05 % emollient cream 1 application , Topical, Nightly   esomeprazole  (KLS ESOMEPRAZOLE  MAGNESIUM ) 20 mg, Oral, Daily   fluconazole  (DIFLUCAN ) 200 mg, Oral, Daily, Initial dose 400mg  (2 tablets).   HYDROcodone -acetaminophen  (NORCO/VICODIN) 5-325 MG tablet 1 tablet, Oral, Every 4 hours PRN   KLS ALLER-FLO 50 MCG/ACT nasal spray INSTILL 2 SPRAYS INTO BOTH NOSTRILS ONCE DAILY   losartan  (COZAAR ) 50 mg, Oral, Daily   magic mouthwash (nystatin , hydrocortisone, diphenhydrAMINE ) suspension 5 mLs, Swish & Spit, 3 times daily   meloxicam  (MOBIC ) 15 MG tablet TAKE ONE TABLET BY MOUTH DAILY AS NEEDED FOR PAIN   Menthol -Methyl Salicylate (SALONPAS PAIN RELIEF PATCH EX) 1 patch, Daily PRN   pravastatin  (PRAVACHOL ) 20 mg, Oral, Daily   Tavaborole  (KERYDIN ) 5 % SOLN 1 drop, Apply externally, Daily, Apply 1 drop to the toenail daily.   urea  (CARMOL) 40 % CREA 1 Application, Topical, Daily   Vitamin D3 2,000 Units, Daily     Physical Exam:   BP (!) 155/79 (BP Location: Left Arm, Patient Position: Sitting)   Pulse 86   SpO2 93%   Salient findings:  CN II-XII intact Right EAC clear and TM intact with well pneumatized middle ear space Left EAC with small amount of cerumen with well pneumatized middle ear space  Whitish yellow patches on top of tongue similar  to how thrush appears, not able to scrape off easily. No bleeding or ulcerations. No lesions on sides of tongue, floor of mouth or ventral tongue. Wears upper dentures.  No obviously palpable neck masses/lymphadenopathy/thyromegaly No respiratory distress or stridor  Seprately Identifiable Procedures:  Prior to initiating any procedures, risks/benefits/alternatives were explained to the patient and verbal consent obtained. None  Impression & Plans:  [name] is a 73 y.o. male with   1. Thrush    Assessment and Plan Assessment & Plan Oral candidiasis (fungal infection of tongue) Does not appear concerning leukoplakia currently and has not tried any mouthwashes. Recurrent oral candidiasis with increased frequency this year. Symptoms include stinging and tingling. Previous Diflucan  treatment provided temporary relief.  - Prescribed Magic Mouthwash for 10 days with antifungal and steroid.  - Advised to contact if symptoms worsen or pain develops. - Scheduled follow-up in a couple of weeks. If still present can consider biopsy although location and appearance does  not appear concerning currently  Left ear fullness - Small amount of cerumen today. Will re-check at next visit   See below regarding exact medications prescribed this encounter including dosages and route: Meds ordered this encounter  Medications   magic mouthwash (nystatin , hydrocortisone, diphenhydrAMINE ) suspension    Sig: Swish and spit 5 mLs 3 (three) times daily for 10 days.    Dispense:  150 mL    Refill:  0    Thank you for allowing me the opportunity to care for your patient. Please do not hesitate to contact me should you have any other questions.  Sincerely, Hadassah Parody, MD Otolaryngologist (ENT), [facility] Phone: [phone] Fax: [phone]

## 2024-08-04 ENCOUNTER — Encounter (INDEPENDENT_AMBULATORY_CARE_PROVIDER_SITE_OTHER): Payer: Self-pay

## 2024-08-04 ENCOUNTER — Ambulatory Visit (INDEPENDENT_AMBULATORY_CARE_PROVIDER_SITE_OTHER)

## 2024-08-04 VITALS — BP 115/73 | HR 87

## 2024-08-04 DIAGNOSIS — B370 Candidal stomatitis: Secondary | ICD-10-CM

## 2024-08-04 MED ORDER — NYSTATIN 100000 UNIT/ML MT SUSP: 5.0000 mL | Freq: Three times a day (TID) | OROMUCOSAL | 3 refills | Status: AC | PRN

## 2024-08-04 NOTE — Progress Notes (Signed)
 Dear Dr. Levora, Here is my assessment for our mutual patient, [name]. Thank you for allowing me the opportunity to care for your patient. Please do not hesitate to contact me should you have any other questions. Sincerely, Dr. Hadassah Parody  Otolaryngology Clinic Note Referring provider: Dr. Levora HPI:   Initial HPI (07/21/24) Discussed the use of AI scribe software for clinical note transcription with the patient, who gave verbal consent to proceed.  [name] is a 73 year old male who presents with recurrent white patches on the tongue.  Oral mucosal lesions - Recurrent white patches on the tongue, increasing in frequency from two to three times per year last year to seven times this year - Stinging sensation when eating and tingling sensation when not eating - No pain, burning, or voice changes - Completed a week-long course of Diflucan  today with only slight improvement; patches return within weeks after treatment - No use of nystatin  or other prescription mouthwashes - Inquires about a large taste bud at the back of the tongue and black knots under the tongue  Otologic symptoms - Sensation of something in the left ear - No ear pain - History of wax buildup in ears  Tobacco use history - Quit smoking 20 years ago after a 17 year history of smoking  History of Present Illness --------------------------------------------------------- 08/04/2024  Doing much better after using the magic mouthwash and feels like it has resolved. He was taking medication three times daily and last used a few days ago.     Independent Review of Additional Tests or Records:  Referral note 07/14/24 Reyes Levora, MD: treated multiple times for candidiasis. Sometimes clear with diflucan . Sometimes has choking sensation. Uses dentures. Consider biopsy of leukoplakia    PMH/Meds/All/SocHx/FamHx/ROS:   Past Medical History:  Diagnosis Date   Allergy    seasonal    Arthritis    oa   Asthma    COPD (chronic obstructive pulmonary disease) (HCC)    GERD (gastroesophageal reflux disease)    Hypertension    Prostate cancer (HCC) 01/2016   prostatectomy    Shortness of breath dyspnea    Thrush      Past Surgical History:  Procedure Laterality Date   HERNIA REPAIR  last done 3-4 yrs ago   x 2   INGUINAL HERNIA REPAIR Right 11/20/2013   Procedure: OPEN REPAIR OF RECURRENT RIGHT INGUINAL HERNIA WITH MESH;  Surgeon: Donnice POUR. Belinda, MD;  Location: [facility];  Service: General;  Laterality: Right;   INSERTION OF MESH Right 11/20/2013   Procedure: INSERTION OF MESH;  Surgeon: Donnice POUR. Belinda, MD;  Location: [facility];  Service: General;  Laterality: Right;   KNEE SURGERY Left july 2014   mcl and meniscus repair   LYMPHADENECTOMY Bilateral 02/09/2016   Procedure: PELVIC LYMPHADENECTOMY;  Surgeon: Ricardo Likens, MD;  Location: [facility];  Service: Urology;  Laterality: Bilateral;   right knee arthroscopy      ROBOT ASSISTED LAPAROSCOPIC RADICAL PROSTATECTOMY N/A 02/09/2016   Procedure: XI ROBOTIC ASSISTED LAPAROSCOPIC RADICAL PROSTATECTOMY WITH INDOCYANINE GREEN DYE AND ADHESIOLYSIS;  Surgeon: Ricardo Likens, MD;  Location: [facility];  Service: Urology;  Laterality: N/A;   TOTAL KNEE ARTHROPLASTY Right 09/06/2015   Procedure: RIGHT TOTAL KNEE ARTHROPLASTY;  Surgeon: Donnice Car, MD;  Location: [facility];  Service: Orthopedics;  Laterality: Right;   TOTAL KNEE ARTHROPLASTY Left 06/17/2019   Procedure: TOTAL KNEE ARTHROPLASTY;  Surgeon: Car Donnice, MD;  Location: [facility];  Service: Orthopedics;  Laterality: Left;  70 mins    Family History  Problem Relation Age of Onset   Cancer Mother    Cancer Father    Cancer Sister    Arthritis Sister    Cancer Maternal Grandmother    Heart disease Maternal Grandfather    Stroke Maternal Grandfather      Social Connections: Moderately Isolated (07/02/2023)   Social Connection and Isolation Panel    Frequency of Communication  with Friends and Family: Twice a week    Frequency of Social Gatherings with Friends and Family: Once a week    Attends Religious Services: Never    Database Administrator or Organizations: No    Attends Engineer, Structural: Never    Marital Status: Married     Current Outpatient Medications  Medication Instructions   albuterol  (PROVENTIL  HFA) 108 (90 Base) MCG/ACT inhaler Proventil  HFA 90 mcg/actuation aerosol inhaler   2 puffs by inhalation route every 4-6 hours as needed.   amLODipine  (NORVASC ) 5 mg, Oral, Daily   azelastine  (OPTIVAR ) 0.05 % ophthalmic solution 1 drop, Both Eyes, 2 times daily PRN, For itching/allergy symptoms   celecoxib  (CELEBREX ) 200 mg, Daily   clobetasol  cream (TEMOVATE ) 0.05 % Topical, 2 times daily, Apply topically to affected areas twice a day for no longer than two weeks   Clobetasol  Prop Emollient Base (CLOBETASOL  PROPIONATE E) 0.05 % emollient cream 1 application , Topical, Nightly   esomeprazole  (KLS ESOMEPRAZOLE  MAGNESIUM ) 20 mg, Oral, Daily   HYDROcodone -acetaminophen  (NORCO/VICODIN) 5-325 MG tablet 1 tablet, Oral, Every 4 hours PRN   KLS ALLER-FLO 50 MCG/ACT nasal spray INSTILL 2 SPRAYS INTO BOTH NOSTRILS ONCE DAILY   losartan  (COZAAR ) 50 mg, Oral, Daily   magic mouthwash (nystatin , diphenhydrAMINE , alum & mag hydroxide) suspension mixture 5 mLs, Swish & Spit, 3 times daily PRN   meloxicam  (MOBIC ) 15 MG tablet TAKE ONE TABLET BY MOUTH DAILY AS NEEDED FOR PAIN   Menthol -Methyl Salicylate (SALONPAS PAIN RELIEF PATCH EX) 1 patch, Daily PRN   pravastatin  (PRAVACHOL ) 20 mg, Oral, Daily   Tavaborole  (KERYDIN ) 5 % SOLN 1 drop, Apply externally, Daily, Apply 1 drop to the toenail daily.   urea  (CARMOL) 40 % CREA 1 Application, Topical, Daily   Vitamin D3 2,000 Units, Daily     Physical Exam:   BP 115/73 (BP Location: Left Arm, Patient Position: Sitting)   Pulse 87   SpO2 90%   Salient findings:  CN II-XII intact Right EAC clear and TM intact  with well pneumatized middle ear space Left EAC with small amount of cerumen with well pneumatized middle ear space  Patches on tongue resolved  No obviously palpable neck masses/lymphadenopathy/thyromegaly No respiratory distress or stridor  Seprately Identifiable Procedures:  Prior to initiating any procedures, risks/benefits/alternatives were explained to the patient and verbal consent obtained. None  Impression & Plans:  Merek Niu is a 73 y.o. male with   1. Oral candidiasis     Assessment and Plan Assessment & Plan  Oral candidiasis Significant improvement with current treatment. No malignancy or precancerous lesions observed.  - Refilled magic mouthwash to be used prn for flare ups with refills  - No biopsy needed at this time. Follow up as needed or if concerns arise.   See below regarding exact medications prescribed this encounter including dosages and route: Meds ordered this encounter  Medications   magic mouthwash (nystatin , diphenhydrAMINE , alum & mag hydroxide) suspension mixture    Sig: Swish and spit 5 mLs 3 (  three) times daily as needed for mouth pain.    Dispense:  240 mL    Refill:  3    Thank you for allowing me the opportunity to care for your patient. Please do not hesitate to contact me should you have any other questions.  Sincerely, Hadassah Parody, MD Otolaryngologist (ENT), [facility] Phone: [phone] Fax: [phone]

## 2024-08-06 ENCOUNTER — Encounter: Payer: Self-pay | Admitting: Emergency Medicine

## 2024-08-06 ENCOUNTER — Ambulatory Visit: Admitting: Emergency Medicine

## 2024-08-06 VITALS — BP 128/78 | HR 78 | Temp 98.1°F | Ht 71.0 in | Wt 224.0 lb

## 2024-08-06 DIAGNOSIS — J449 Chronic obstructive pulmonary disease, unspecified: Secondary | ICD-10-CM

## 2024-08-06 DIAGNOSIS — R918 Other nonspecific abnormal finding of lung field: Secondary | ICD-10-CM

## 2024-08-06 NOTE — Patient Instructions (Signed)
 Keep your albuterol  available to use 2 puffs if needed for shortness of breath, chest tightness, wheezing. Will hold off on starting an everyday scheduled inhaler medication for now We will repeat your CT scan of the chest in April 2026. Follow Dr. Shelah in April after your CT so we can review those results together.

## 2024-08-06 NOTE — Progress Notes (Signed)
   Subjective:    Patient ID: [name], male    DOB: 07/17/1951, 73 y.o.   MRN: [id number]  HPI  ROV 08/06/2024 --follow-up visit 73 year old gentleman with a history of COPD.  I have followed him for this as well as pulmonary nodular disease on CT chest that has improved over time on serial imaging.  He never underwent bronchoscopy given the improvement.  He has had difficulty tolerating various inhaled medications including Spiriva , Stiolto, others.  We have been managing him off of scheduled BD, uses albuterol  approximately once a week.  He has been doing ok. He has been active. He isn't sure how much albuterol  helps him. He has cough, usually dry. Minimal PND. He is using flonase  daily. He has some GERD, decent control w esomeprazole .    Review of Systems  Constitutional:  Negative for fever and unexpected weight change.  HENT:  Negative for congestion, dental problem, ear pain, nosebleeds, postnasal drip, rhinorrhea, sinus pressure, sneezing, sore throat and trouble swallowing.   Eyes:  Negative for redness and itching.  Respiratory:  Negative for chest tightness, shortness of breath and wheezing.   Cardiovascular:  Negative for palpitations and leg swelling.       Knees   Gastrointestinal:  Negative for nausea and vomiting.  Genitourinary:  Negative for dysuria.  Musculoskeletal:  Negative for joint swelling.  Skin:  Negative for rash.  Neurological:  Negative for headaches.  Hematological:  Does not bruise/bleed easily.  Psychiatric/Behavioral:  Negative for dysphoric mood. The patient is not nervous/anxious.        Objective:   Physical Exam Vitals:   08/06/24 1429  BP: 128/78  Pulse: 78  Temp: 98.1 F (36.7 C)  SpO2: 97%  Weight: 224 lb (101.6 kg)  Height: 5' 11 (1.803 m)     Gen: Pleasant, well-nourished, in no distress,  normal affect  ENT: No lesions,  mouth clear, he has some white areas on the tongue without any overt plaques.  Pharynx looks  clear  Neck: No JVD, no stridor  Lungs: No use of accessory muscles, clear without rales or rhonchi  Cardiovascular: RRR, heart sounds normal, no murmur or gallops, no peripheral edema  Musculoskeletal: No deformities, no cyanosis or clubbing  Neuro: alert, non focal  Skin: Warm, no lesions or rash       Assessment & Plan:  COPD, mild (HCC) Keep your albuterol  available to use 2 puffs if needed for shortness of breath, chest tightness, wheezing. Will hold off on starting an everyday scheduled inhaler medication for now  Pulmonary nodules We will repeat your CT scan of the chest in April 2026. Follow Dr. Shelah in April after your CT so we can review those results together.     Lamar Shelah, MD, PhD 08/21/2024, 5:07 PM [facility] [phone] or if no answer [phone]

## 2024-08-21 ENCOUNTER — Ambulatory Visit: Admitting: Emergency Medicine

## 2024-08-21 NOTE — Assessment & Plan Note (Signed)
 We will repeat your CT scan of the chest in April 2026. Follow Dr. Shelah in April after your CT so we can review those results together.

## 2024-08-21 NOTE — Assessment & Plan Note (Signed)
 Keep your albuterol  available to use 2 puffs if needed for shortness of breath, chest tightness, wheezing. Will hold off on starting an everyday scheduled inhaler medication for now

## 2024-09-25 ENCOUNTER — Ambulatory Visit (INDEPENDENT_AMBULATORY_CARE_PROVIDER_SITE_OTHER): Admitting: Family Medicine

## 2024-09-25 ENCOUNTER — Ambulatory Visit (INDEPENDENT_AMBULATORY_CARE_PROVIDER_SITE_OTHER)
Admission: RE | Admit: 2024-09-25 | Discharge: 2024-09-25 | Disposition: A | Discharge status: Home / Self Care | Source: Ambulatory Visit | Attending: Family Medicine | Admitting: Family Medicine

## 2024-09-25 ENCOUNTER — Encounter: Payer: Self-pay | Admitting: Family Medicine

## 2024-09-25 VITALS — BP 132/78 | HR 76 | Temp 99.4°F | Resp 16 | Ht 71.0 in | Wt 225.6 lb

## 2024-09-25 DIAGNOSIS — M542 Cervicalgia: Secondary | ICD-10-CM

## 2024-09-25 DIAGNOSIS — M5412 Radiculopathy, cervical region: Secondary | ICD-10-CM

## 2024-09-25 MED ORDER — CYCLOBENZAPRINE HCL 5 MG PO TABS: 5.0000 mg | ORAL_TABLET | Freq: Three times a day (TID) | ORAL | 1 refills | Status: AC | PRN

## 2024-09-25 MED ORDER — PREDNISONE 20 MG PO TABS: ORAL_TABLET | ORAL | 0 refills | Status: AC

## 2024-09-25 NOTE — Progress Notes (Signed)
 "  Subjective:  Patient ID: [name], male    DOB: 25-Jun-1951  Age: 74 y.o. MRN: [id number]  CC:  Chief Complaint  Patient presents with   Tingling    Neck, right arm/hand and right shoulder. Pain. Numbness. Thinks he has a pinched nerve. Sx started 1 week ago. Thought sx would go away but it hasnt    HPI [name] presents for   Initially scheduled for physical, but having acute symptoms today.  Neck, right arm, right shoulder pain, tingling, numbness. Symptoms started 1 week ago without known injury, no fall. Woke up with slight neck pain on the right, noticed an hour later at work - felt electrical shock sensation down R arm, shoulder, and into hand. Felt weaker on R side, but still able to make a fist/grasp.  No rash/blisters. Thought initially was better, but continues to some back - off and on.  Tx: alleve, tylenol  - minimal to no relief.  No topical treatments or ice/heat.    No c spine surgery/injections.  MRI in 2004 with: BROAD-BASED DISK PROTRUSION C6-7 CENTRAL AND TO THE LEFT WITH LEFT C7 NERVE ROOT ENCROACHMENT.  SMALL CENTRAL PROTRUSION C7-T1 WITHOUT C8 NERVE ROOT COMPROMISE    History of thrush that has been controlled with new treatment.  No recent flares.  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   Pulmonary nodules 02/23/2014   Tobacco use disorder, moderate, in sustained remission 02/23/2014   Recurrent right inguinal hernia 10/20/2013   ED (erectile dysfunction) 08/26/2012    Physical exam, annual 11/05/2011   HTN (hypertension) 11/05/2011   GERD (gastroesophageal reflux disease) 11/05/2011   Chronic joint pain 11/05/2011   Past Medical History:  Diagnosis Date   Allergy    seasonal   Arthritis    oa   Asthma    COPD (chronic obstructive pulmonary disease) (HCC)    GERD (gastroesophageal reflux disease)    Hypertension    Prostate cancer (HCC) 01/2016   prostatectomy    Shortness of breath dyspnea    Thrush    Past Surgical History:  Procedure Laterality Date   HERNIA REPAIR  last done 3-4 yrs ago   x 2   INGUINAL HERNIA REPAIR Right 11/20/2013   Procedure: OPEN REPAIR OF RECURRENT RIGHT INGUINAL HERNIA WITH MESH;  Surgeon: Donnice POUR. Belinda, MD;  Location: [facility];  Service: General;  Laterality: Right;   INSERTION OF MESH Right 11/20/2013   Procedure: INSERTION OF MESH;  Surgeon: Donnice POUR. Belinda, MD;  Location: [facility];  Service: General;  Laterality: Right;   KNEE SURGERY Left july 2014   mcl and meniscus repair   LYMPHADENECTOMY Bilateral 02/09/2016   Procedure: PELVIC LYMPHADENECTOMY;  Surgeon: Ricardo Likens, MD;  Location: [facility];  Service: Urology;  Laterality: Bilateral;   right knee arthroscopy      ROBOT ASSISTED LAPAROSCOPIC RADICAL PROSTATECTOMY N/A 02/09/2016   Procedure: XI ROBOTIC ASSISTED LAPAROSCOPIC RADICAL PROSTATECTOMY WITH INDOCYANINE GREEN DYE AND ADHESIOLYSIS;  Surgeon: Ricardo  Alvaro, MD;  Location: [facility];  Service: Urology;  Laterality: N/A;   TOTAL KNEE ARTHROPLASTY Right 09/06/2015   Procedure: RIGHT TOTAL KNEE ARTHROPLASTY;  Surgeon: Donnice Car, MD;  Location: [facility];  Service: Orthopedics;  Laterality: Right;   TOTAL KNEE ARTHROPLASTY Left 06/17/2019   Procedure: TOTAL KNEE ARTHROPLASTY;  Surgeon: Car Donnice, MD;  Location: [facility];  Service: Orthopedics;  Laterality: Left;  70 mins   Allergies[1] Prior to Admission medications  Medication Sig Start Date End Date Taking? Authorizing Provider  albuterol  (PROVENTIL  HFA)  108 (90 Base) MCG/ACT inhaler Proventil  HFA 90 mcg/actuation aerosol inhaler   2 puffs by inhalation route every 4-6 hours as needed. 01/10/24  Yes Shelah Lamar RAMAN, MD  amLODipine  (NORVASC ) 5 MG tablet Take 1 tablet (5 mg total) by mouth daily. 04/07/24  Yes Levora Reyes SAUNDERS, MD  celecoxib  (CELEBREX ) 200 MG capsule Take 200 mg by mouth daily. 06/26/24  Yes [provider]  Cholecalciferol (VITAMIN D3) 50 MCG (2000 UT) TABS Take 2,000 Units by mouth daily.   Yes [provider]  clobetasol  cream (TEMOVATE ) 0.05 % Apply topically 2 (two) times daily. Apply topically to affected areas twice a day for no longer than two weeks 02/12/24  Yes Alm Delon SAILOR, DO  esomeprazole  (KLS ESOMEPRAZOLE  MAGNESIUM ) 20 MG capsule Take 1 capsule (20 mg total) by mouth daily. 04/07/24  Yes Levora Reyes SAUNDERS, MD  HYDROcodone -acetaminophen  (NORCO/VICODIN) 5-325 MG tablet Take 1 tablet by mouth every 4 (four) hours as needed. 05/09/24  Yes Fondaw, Wylder S, PA  KLS ALLER-FLO 50 MCG/ACT nasal spray INSTILL 2 SPRAYS INTO BOTH NOSTRILS ONCE DAILY 03/11/24  Yes Levora Reyes SAUNDERS, MD  losartan  (COZAAR ) 50 MG tablet Take 1 tablet (50 mg total) by mouth daily. 04/07/24  Yes Levora Reyes SAUNDERS, MD  magic mouthwash (nystatin , diphenhydrAMINE , alum & mag hydroxide) suspension mixture Swish and spit 5 mLs 3 (three) times daily as needed for mouth pain. 08/04/24  Yes Masciello, Hadassah BROCKS, MD  meloxicam  (MOBIC ) 15 MG tablet TAKE ONE TABLET BY MOUTH DAILY AS NEEDED FOR PAIN 01/15/24  Yes Gershon Donnice SAUNDERS, DPM  Menthol -Methyl Salicylate (SALONPAS PAIN RELIEF PATCH EX) Place 1 patch onto the skin daily as needed (pain.).   Yes [provider]  pravastatin  (PRAVACHOL ) 20 MG tablet Take 1 tablet (20 mg total) by mouth daily. 04/07/24  Yes Levora Reyes SAUNDERS, MD  azelastine  (OPTIVAR ) 0.05 % ophthalmic solution Place 1 drop into both eyes 2 (two) times daily as needed. For itching/allergy symptoms Patient not taking: Reported  on 09/25/2024 07/14/19   Levora Reyes SAUNDERS, MD  Clobetasol  Prop Emollient Base (CLOBETASOL  PROPIONATE E) 0.05 % emollient cream Apply 1 application topically at bedtime. Patient not taking: Reported on 09/25/2024 08/22/21   Livingston Rigg, MD  Tavaborole  (KERYDIN ) 5 % SOLN Apply 1 drop topically daily. Apply 1 drop to the toenail daily. Patient not taking: Reported on 09/25/2024 06/15/22   Gershon Donnice SAUNDERS, DPM  urea  (CARMOL) 40 % CREA Apply 1 Application topically daily. Patient not taking: Reported on 09/25/2024 10/31/22   Gershon Donnice SAUNDERS, DPM   Social History   Socioeconomic History   Marital status: Married    Spouse name: Not on file   Number of children: Not on file   Years of education: Not on file   Highest education level: Not on file  Occupational History   Not on file  Tobacco Use   Smoking status: Former    Current packs/day: 0.00  Average packs/day: 1.5 packs/day for 20.0 years (30.0 ttl pk-yrs)    Types: Cigarettes    Start date: 11/05/1979    Quit date: 11/05/1999    Years since quitting: 24.9   Smokeless tobacco: Never  Vaping Use   Vaping status: Never Used  Substance and Sexual Activity   Alcohol use: No    Comment: last used 25 years ago    Drug use: Yes    Types: Cocaine    Comment: last used cocaine  and acid 25 yrs ago,none used since   Sexual activity: Not on file  Other Topics Concern   Not on file  Social History Narrative   Not on file   Social Drivers of Health   Tobacco Use: Medium Risk (09/25/2024)   Patient History    Smoking Tobacco Use: Former    Smokeless Tobacco Use: Never    Passive Exposure: Not on file  Financial Resource Strain: Low Risk (07/02/2023)   Overall Financial Resource Strain (CARDIA)    Difficulty of Paying Living Expenses: Not hard at all  Food Insecurity: No Food Insecurity (07/02/2023)   Hunger Vital Sign    Worried About Running Out of Food in the Last Year: Never true    Ran Out of Food in the Last Year: Never true   Transportation Needs: No Transportation Needs (07/02/2023)   PRAPARE - Administrator, Civil Service (Medical): No    Lack of Transportation (Non-Medical): No  Physical Activity: Insufficiently Active (07/02/2023)   Exercise Vital Sign    Days of Exercise per Week: 4 days    Minutes of Exercise per Session: 20 min  Stress: No Stress Concern Present (07/02/2023)   [employer] of Occupational Health - Occupational Stress Questionnaire    Feeling of Stress : Not at all  Social Connections: Moderately Isolated (07/02/2023)   Social Connection and Isolation Panel    Frequency of Communication with Friends and Family: Twice a week    Frequency of Social Gatherings with Friends and Family: Once a week    Attends Religious Services: Never    Database Administrator or Organizations: No    Attends Banker Meetings: Never    Marital Status: Married  Catering Manager Violence: Not At Risk (07/02/2023)   Humiliation, Afraid, Rape, and Kick questionnaire    Fear of Current or Ex-Partner: No    Emotionally Abused: No    Physically Abused: No    Sexually Abused: No  Depression (PHQ2-9): Low Risk (04/07/2024)   Depression (PHQ2-9)    PHQ-2 Score: 0  Alcohol Screen: Low Risk (07/02/2023)   Alcohol Screen    Last Alcohol Screening Score (AUDIT): 0  Housing: Low Risk (07/02/2023)   Housing    Last Housing Risk Score: 0  Utilities: Not At Risk (07/02/2023)   AHC Utilities    Threatened with loss of utilities: No  Health Literacy: Adequate Health Literacy (07/02/2023)   B1300 Health Literacy    Frequency of need for help with medical instructions: Never    Review of Systems Per HPI.   Objective:   Vitals:   09/25/24 1012  BP: 132/78  Pulse: 76  Resp: 16  Temp: 99.4 F (37.4 C)  TempSrc: Temporal  SpO2: 99%  Weight: 225 lb 9.6 oz (102.3 kg)  Height: 5' 11 (1.803 m)     Physical Exam Vitals reviewed.  Constitutional:      Appearance: He is  well-developed.  HENT:     Head:  Normocephalic and atraumatic.  Neck:     Vascular: No carotid bruit or JVD.  Cardiovascular:     Rate and Rhythm: Normal rate and regular rhythm.     Heart sounds: Normal heart sounds. No murmur heard. Pulmonary:     Effort: Pulmonary effort is normal.     Breath sounds: Normal breath sounds. No rales.  Musculoskeletal:     Right lower leg: No edema.     Left lower leg: No edema.     Comments: C-spine, no midline bony tenderness.  Limited range of motion with decreased extension, right lateral flexion and right rotation.  Some discomfort into his right neck with left lateral flexion.  No focal bony tenderness of shoulder.  No focal tenderness of upper or lower arm, hand or wrist.  Reports dysesthesias in the hand but cap refill less than 1 second with warm fingers.  Grip strength possibly slightly weaker on the right compared to left but overall strength in right arm versus left is equal.  Difficulty obtaining reflexes bilaterally.  Skin:    General: Skin is warm and dry.  Neurological:     Mental Status: He is alert and oriented to person, place, and time.  Psychiatric:        Mood and Affect: Mood normal.        Assessment & Plan:  [name] is a 74 y.o. male . Neck pain on right side - Plan: cyclobenzaprine  (FLEXERIL ) 5 MG tablet, predniSONE  (DELTASONE ) 20 MG tablet, DG Cervical Spine Complete  Right cervical radiculopathy - Plan: cyclobenzaprine  (FLEXERIL ) 5 MG tablet, predniSONE  (DELTASONE ) 20 MG tablet, DG Cervical Spine Complete  1 week history of right sided neck pain, dysesthesias into arm, suspected cervical radiculopathy with prior degenerative disc disease, disc protrusion noted on MRI in 2004.  Likely progressive degenerative disc disease, neuronal impingement.  I do not appreciate a significant amount of weakness on exam, but possible slight decreased grip strength on right versus left.  No improvement with trial of NSAID  over-the-counter.  Will proceed with imaging, initially C-spine x-ray, start prednisone  as an effective treatment with over-the-counter NSAIDs.  Potential side effects and risks of prednisone  discussed including possible recurrence of thrush but he does have home treatment.  Muscle relaxant if needed at night in the meantime but potential side effects discussed, lowest effective dose.  Recheck in the next 1 week if not improving, sooner if worse.  If persistent symptoms in spite of prednisone , potentially may need MRI or eval with spine specialist.  Meds ordered this encounter  Medications   cyclobenzaprine  (FLEXERIL ) 5 MG tablet    Sig: Take 1 tablet (5 mg total) by mouth 3 (three) times daily as needed for muscle spasms.    Dispense:  15 tablet    Refill:  1   predniSONE  (DELTASONE ) 20 MG tablet    Sig: 3 by mouth for 3 days, then 2 by mouth for 2 days, then 1 by mouth for 2 days, then 1/2 by mouth for 2 days.    Dispense:  16 tablet    Refill:  0   Patient Instructions  Thanks for coming in today.  I do think that you have a pinched nerve from the neck causing the symptoms down your right arm.  This is often called a cervical radiculopathy.  Looking back at your MRI many years ago, there was a bulging disc at that time, and that is likely the culprit again.  Hopefully we can get  this to calm down with a little bit of prednisone , and a muscle relaxant at night.  Take prednisone  in the morning.  Please have x-ray performed at the [facility] location below.  Follow-up with me in 1 week if not improving, sooner if worse.  Can reschedule physical within the next month or 2 but again follow-up sooner if neck symptoms, arm symptoms are not improving or worsening.  Take care!  [facility] or xray: Walk in 8:30-4:30 during weekdays, no appointment needed [address]  [place], [place] [id number]     Signed,   Reyes Pines, MD  Primary Care, [facility] 09/25/2024 10:54 AM      [1]  Allergies Allergen Reactions   Percocet [Oxycodone -Acetaminophen ] Other (See Comments)    Nausea-Doesn't work   Amoxicillin  Rash    Has patient had a PCN reaction causing immediate rash, facial/tongue/throat swelling, SOB or lightheadedness with hypotension: No Has patient had a PCN reaction causing severe rash involving mucus membranes or skin necrosis: No Has patient had a PCN reaction that required hospitalization No Has patient had a PCN reaction occurring within the last 10 years: Yes- 2016 If all of the above answers are NO, then may proceed with Cephalosporin use.    "

## 2024-09-25 NOTE — Patient Instructions (Addendum)
 Thanks for coming in today.  I do think that you have a pinched nerve from the neck causing the symptoms down your right arm.  This is often called a cervical radiculopathy.  Looking back at your MRI many years ago, there was a bulging disc at that time, and that is likely the culprit again.  Hopefully we can get this to calm down with a little bit of prednisone , and a muscle relaxant at night.  Take prednisone  in the morning.  Please have x-ray performed at the [facility] location below.  Follow-up with me in 1 week if not improving, sooner if worse.  Can reschedule physical within the next month or 2 but again follow-up sooner if neck symptoms, arm symptoms are not improving or worsening.  Take care!  [facility] or x[facility]: Walk in 8:30-4:30 during weekdays, no appointment needed [address]  [place], [place] [id number]

## 2024-09-26 ENCOUNTER — Ambulatory Visit: Payer: Self-pay | Admitting: Family Medicine

## 2024-09-29 ENCOUNTER — Encounter: Admitting: Family Medicine

## 2024-11-27 ENCOUNTER — Encounter: Admitting: Family Medicine

## 2025-01-05 ENCOUNTER — Other Ambulatory Visit

## 2025-02-16 ENCOUNTER — Ambulatory Visit: Admitting: Dermatology

## 2025-10-05 ENCOUNTER — Encounter: Admitting: Family Medicine
